# Patient Record
Sex: Male | Born: 1935
Health system: Southern US, Community
[De-identification: ages and names within clinical notes are randomized; demographics above are authoritative.]

## PROBLEM LIST (undated history)

## (undated) DIAGNOSIS — I639 Cerebral infarction, unspecified: Secondary | ICD-10-CM

## (undated) DIAGNOSIS — G4733 Obstructive sleep apnea (adult) (pediatric): Secondary | ICD-10-CM

## (undated) DIAGNOSIS — I10 Essential (primary) hypertension: Secondary | ICD-10-CM

## (undated) DIAGNOSIS — K259 Gastric ulcer, unspecified as acute or chronic, without hemorrhage or perforation: Secondary | ICD-10-CM

## (undated) DIAGNOSIS — G479 Sleep disorder, unspecified: Secondary | ICD-10-CM

## (undated) DIAGNOSIS — E785 Hyperlipidemia, unspecified: Secondary | ICD-10-CM

## (undated) DIAGNOSIS — N184 Chronic kidney disease, stage 4 (severe): Secondary | ICD-10-CM

## (undated) DIAGNOSIS — I5042 Chronic combined systolic (congestive) and diastolic (congestive) heart failure: Secondary | ICD-10-CM

## (undated) DIAGNOSIS — F0390 Unspecified dementia without behavioral disturbance: Secondary | ICD-10-CM

## (undated) HISTORY — PX: CATARACT EXTRACTION: SUR2

## (undated) HISTORY — DX: Cerebral infarction, unspecified: I63.9

## (undated) HISTORY — PX: ABDOMINAL SURGERY: SHX537

## (undated) HISTORY — DX: Sleep disorder, unspecified: G47.9

---

## 2000-03-28 ENCOUNTER — Inpatient Hospital Stay (HOSPITAL_COMMUNITY): Admission: EM | Admit: 2000-03-28 | Discharge: 2000-03-30 | Payer: Self-pay | Admitting: Emergency Medicine

## 2001-03-22 ENCOUNTER — Ambulatory Visit (HOSPITAL_COMMUNITY): Admission: RE | Admit: 2001-03-22 | Discharge: 2001-03-22 | Payer: Self-pay | Admitting: Gastroenterology

## 2001-06-27 ENCOUNTER — Ambulatory Visit (HOSPITAL_BASED_OUTPATIENT_CLINIC_OR_DEPARTMENT_OTHER): Admission: RE | Admit: 2001-06-27 | Discharge: 2001-06-27 | Payer: Self-pay | Admitting: Internal Medicine

## 2004-12-28 ENCOUNTER — Ambulatory Visit: Payer: Self-pay | Admitting: Internal Medicine

## 2013-03-04 ENCOUNTER — Emergency Department (HOSPITAL_COMMUNITY)
Admission: EM | Admit: 2013-03-04 | Discharge: 2013-03-04 | Disposition: A | Discharge status: Home / Self Care | Payer: Medicare Other | Source: Home / Self Care | Attending: Family Medicine | Admitting: Family Medicine

## 2013-03-04 ENCOUNTER — Encounter (HOSPITAL_COMMUNITY): Payer: Self-pay | Admitting: Emergency Medicine

## 2013-03-04 ENCOUNTER — Emergency Department (INDEPENDENT_AMBULATORY_CARE_PROVIDER_SITE_OTHER): Payer: Medicare Other

## 2013-03-04 DIAGNOSIS — S2249XA Multiple fractures of ribs, unspecified side, initial encounter for closed fracture: Secondary | ICD-10-CM

## 2013-03-04 HISTORY — DX: Essential (primary) hypertension: I10

## 2013-03-04 MED ORDER — HYDROCOD POLST-CHLORPHEN POLST 10-8 MG/5ML PO LQCR: 5.0000 mL | Freq: Two times a day (BID) | ORAL | Status: DC | PRN

## 2013-03-04 NOTE — Discharge Instructions (Signed)
Drink plenty of fluids as discussed, use medicine as prescribed, and mucinex or delsym for cough. Return or see your doctor if further problems °

## 2013-03-04 NOTE — ED Provider Notes (Signed)
CSN: [id number]     Arrival date & time 03/04/13  1129 History   First MD Initiated Contact with Patient 03/04/13 1154     Chief Complaint  Patient presents with  . Fall   (Consider location/radiation/quality/duration/timing/severity/associated sxs/prior Treatment) Patient is a 80 y.o. male presenting with fall. The history is provided by the patient and the spouse.  Fall This is a new problem. The current episode started yesterday. The problem has not changed since onset.Associated symptoms include chest pain. Pertinent negatives include no abdominal pain and no shortness of breath. Associated symptoms comments: Only hurts to cough. Having head cong., pnd..    Past Medical History  Diagnosis Date  . Hypertension    Past Surgical History  Procedure Laterality Date  . Abdominal surgery     History reviewed. No pertinent family history. History  Substance Use Topics  . Smoking status: Never Smoker   . Smokeless tobacco: Never Used  . Alcohol Use: No    Review of Systems  Constitutional: Negative.   HENT: Positive for congestion, postnasal drip and rhinorrhea.   Respiratory: Positive for cough. Negative for shortness of breath and wheezing.   Cardiovascular: Positive for chest pain.  Gastrointestinal: Negative.  Negative for abdominal pain.  Musculoskeletal: Negative.     Allergies  Review of patient's allergies indicates no known allergies.  Home Medications   Current Outpatient Rx  Name  Route  Sig  Dispense  Refill  . chlorpheniramine-HYDROcodone (TUSSIONEX PENNKINETIC ER) 10-8 MG/5ML LQCR   Oral   Take 5 mLs by mouth every 12 (twelve) hours as needed for cough.   115 mL   1    BP 130/76  Pulse 76  Temp(Src) 98.6 F (37 C) (Oral)  Resp 16  SpO2 97% Physical Exam  Nursing note and vitals reviewed. Constitutional: He is oriented to person, place, and time. He appears well-developed and well-nourished.  HENT:  Head: Normocephalic.  Right Ear: External ear  normal.  Left Ear: External ear normal.  Nose: Mucosal edema and rhinorrhea present.  Mouth/Throat: Oropharynx is clear and moist.  Eyes: Pupils are equal, round, and reactive to light.  Neck: Normal range of motion. Neck supple.  Cardiovascular: Normal rate, regular rhythm, normal heart sounds and intact distal pulses.   Pulmonary/Chest: Effort normal and breath sounds normal. He exhibits tenderness.    Lymphadenopathy:    He has no cervical adenopathy.  Neurological: He is alert and oriented to person, place, and time.  Skin: Skin is warm and dry.    ED Course  Procedures (including critical care time) Labs Review Labs Reviewed - No data to display Imaging Review Dg Ribs Unilateral W/chest Right  03/04/2013   CLINICAL DATA:  Right-sided pain post fall  EXAM: RIGHT RIBS AND CHEST - 3+ VIEW  COMPARISON:  None available  FINDINGS: Lungs are clear. Heart size upper limits normal. No effusion. No pneumothorax. Fractures of the lateral margin right fifth and sixth ribs, minimally displaced. Suggestion of some healing response at the level of the fifth rib fracture. The skin markers are significantly lower, at approximately the level of the ninth rib.  IMPRESSION: 1. Right fifth and sixth rib fractures, age indeterminate. 2. No pneumothorax or effusion   Electronically Signed   By: Arne Cleveland M.D.   On: 03/04/2013 12:29      MDM  X-rays reviewed and report per radiologist.     Billy Fischer, MD 03/04/13 1254

## 2013-03-04 NOTE — ED Notes (Signed)
Pt  Reports       Pain         r   Rib        Pt  Fell         Last  Pm         Pain  Worse  When  Coughs  And  Takes  A      Deep    Breath

## 2013-03-08 ENCOUNTER — Ambulatory Visit
Admission: RE | Admit: 2013-03-08 | Discharge: 2013-03-08 | Disposition: A | Discharge status: Home / Self Care | Payer: Medicare Other | Source: Ambulatory Visit | Attending: Family Medicine | Admitting: Family Medicine

## 2013-03-08 ENCOUNTER — Other Ambulatory Visit: Payer: Self-pay | Admitting: Family Medicine

## 2013-03-08 DIAGNOSIS — R059 Cough, unspecified: Secondary | ICD-10-CM

## 2013-03-08 DIAGNOSIS — R05 Cough: Secondary | ICD-10-CM

## 2013-12-02 DIAGNOSIS — I639 Cerebral infarction, unspecified: Secondary | ICD-10-CM

## 2013-12-02 HISTORY — DX: Cerebral infarction, unspecified: I63.9

## 2013-12-14 ENCOUNTER — Emergency Department (HOSPITAL_COMMUNITY): Payer: Medicare Other

## 2013-12-14 ENCOUNTER — Inpatient Hospital Stay (HOSPITAL_COMMUNITY)
Admission: EM | Admit: 2013-12-14 | Discharge: 2013-12-20 | DRG: 064 | Disposition: A | Discharge status: Rehabilitation Facility | Payer: Medicare Other | Attending: Neurology | Admitting: Neurology

## 2013-12-14 ENCOUNTER — Encounter (HOSPITAL_COMMUNITY): Payer: Self-pay | Admitting: Family Medicine

## 2013-12-14 DIAGNOSIS — E876 Hypokalemia: Secondary | ICD-10-CM

## 2013-12-14 DIAGNOSIS — I10 Essential (primary) hypertension: Secondary | ICD-10-CM | POA: Diagnosis present

## 2013-12-14 DIAGNOSIS — R509 Fever, unspecified: Secondary | ICD-10-CM | POA: Diagnosis present

## 2013-12-14 DIAGNOSIS — M109 Gout, unspecified: Secondary | ICD-10-CM | POA: Diagnosis present

## 2013-12-14 DIAGNOSIS — H5442 Blindness, left eye, normal vision right eye: Secondary | ICD-10-CM | POA: Diagnosis present

## 2013-12-14 DIAGNOSIS — G9340 Encephalopathy, unspecified: Secondary | ICD-10-CM | POA: Insufficient documentation

## 2013-12-14 DIAGNOSIS — R414 Neurologic neglect syndrome: Secondary | ICD-10-CM | POA: Diagnosis present

## 2013-12-14 DIAGNOSIS — E785 Hyperlipidemia, unspecified: Secondary | ICD-10-CM | POA: Diagnosis present

## 2013-12-14 DIAGNOSIS — R0681 Apnea, not elsewhere classified: Secondary | ICD-10-CM | POA: Diagnosis not present

## 2013-12-14 DIAGNOSIS — Z452 Encounter for adjustment and management of vascular access device: Secondary | ICD-10-CM | POA: Insufficient documentation

## 2013-12-14 DIAGNOSIS — N390 Urinary tract infection, site not specified: Secondary | ICD-10-CM | POA: Diagnosis present

## 2013-12-14 DIAGNOSIS — I611 Nontraumatic intracerebral hemorrhage in hemisphere, cortical: Principal | ICD-10-CM | POA: Diagnosis present

## 2013-12-14 DIAGNOSIS — Z7982 Long term (current) use of aspirin: Secondary | ICD-10-CM | POA: Diagnosis not present

## 2013-12-14 DIAGNOSIS — G8194 Hemiplegia, unspecified affecting left nondominant side: Secondary | ICD-10-CM | POA: Diagnosis present

## 2013-12-14 DIAGNOSIS — I639 Cerebral infarction, unspecified: Secondary | ICD-10-CM

## 2013-12-14 DIAGNOSIS — G936 Cerebral edema: Secondary | ICD-10-CM | POA: Diagnosis present

## 2013-12-14 DIAGNOSIS — Z79899 Other long term (current) drug therapy: Secondary | ICD-10-CM | POA: Diagnosis not present

## 2013-12-14 DIAGNOSIS — Z23 Encounter for immunization: Secondary | ICD-10-CM | POA: Insufficient documentation

## 2013-12-14 DIAGNOSIS — R4182 Altered mental status, unspecified: Secondary | ICD-10-CM | POA: Diagnosis present

## 2013-12-14 DIAGNOSIS — I619 Nontraumatic intracerebral hemorrhage, unspecified: Secondary | ICD-10-CM

## 2013-12-14 LAB — COMPREHENSIVE METABOLIC PANEL
ALBUMIN: 3.5 g/dL (ref 3.5–5.2)
ALK PHOS: 45 U/L (ref 39–117)
ALT: 18 U/L (ref 0–53)
ALT: 18 U/L (ref 0–53)
AST: 24 U/L (ref 0–37)
AST: 26 U/L (ref 0–37)
Albumin: 3.7 g/dL (ref 3.5–5.2)
Alkaline Phosphatase: 46 U/L (ref 39–117)
Anion gap: 14 (ref 5–15)
Anion gap: 16 — ABNORMAL HIGH (ref 5–15)
BILIRUBIN TOTAL: 0.7 mg/dL (ref 0.3–1.2)
BUN: 26 mg/dL — ABNORMAL HIGH (ref 6–23)
BUN: 26 mg/dL — AB (ref 6–23)
CHLORIDE: 98 meq/L (ref 96–112)
CO2: 26 mEq/L (ref 19–32)
CO2: 23 mEq/L (ref 19–32)
Calcium: 9.3 mg/dL (ref 8.4–10.5)
Calcium: 8.9 mg/dL (ref 8.4–10.5)
Chloride: 98 mEq/L (ref 96–112)
Creatinine, Ser: 0.92 mg/dL (ref 0.50–1.35)
Creatinine, Ser: 0.88 mg/dL (ref 0.50–1.35)
GFR calc Af Amer: 90 mL/min — ABNORMAL LOW (ref >90)
GFR calc Af Amer: >90 mL/min (ref >90)
GFR calc non Af Amer: 78 mL/min — ABNORMAL LOW (ref >90)
GFR calc non Af Amer: 79 mL/min — ABNORMAL LOW (ref >90)
Glucose, Bld: 129 mg/dL — ABNORMAL HIGH (ref 70–99)
Glucose, Bld: 120 mg/dL — ABNORMAL HIGH (ref 70–99)
Potassium: 4.2 mEq/L (ref 3.7–5.3)
Potassium: 4.1 mEq/L (ref 3.7–5.3)
Sodium: 138 mEq/L (ref 137–147)
Sodium: 137 mEq/L (ref 137–147)
Total Bilirubin: 0.7 mg/dL (ref 0.3–1.2)
Total Protein: 8.2 g/dL (ref 6.0–8.3)
Total Protein: 7.8 g/dL (ref 6.0–8.3)

## 2013-12-14 LAB — LIPID PANEL
Cholesterol: 165 mg/dL (ref 0–200)
HDL: 57 mg/dL (ref >39)
LDL CALC: 86 mg/dL (ref 0–99)
Total CHOL/HDL Ratio: 2.9 RATIO
Triglycerides: 112 mg/dL (ref <150)
VLDL: 22 mg/dL (ref 0–40)

## 2013-12-14 LAB — URINE MICROSCOPIC-ADD ON

## 2013-12-14 LAB — CBC WITH DIFFERENTIAL/PLATELET
BASOS ABS: 0 10*3/uL (ref 0.0–0.1)
Basophils Absolute: 0 10*3/uL (ref 0.0–0.1)
Basophils Relative: 0 % (ref 0–1)
Basophils Relative: 0 % (ref 0–1)
EOS ABS: 0 10*3/uL (ref 0.0–0.7)
Eosinophils Absolute: 0 10*3/uL (ref 0.0–0.7)
Eosinophils Relative: 0 % (ref 0–5)
Eosinophils Relative: 0 % (ref 0–5)
HCT: 40.8 % (ref 39.0–52.0)
HCT: 39.7 % (ref 39.0–52.0)
Hemoglobin: 13.5 g/dL (ref 13.0–17.0)
Hemoglobin: 13.3 g/dL (ref 13.0–17.0)
LYMPHS PCT: 22 % (ref 12–46)
Lymphocytes Relative: 19 % (ref 12–46)
Lymphs Abs: 2.6 10*3/uL (ref 0.7–4.0)
Lymphs Abs: 3.5 10*3/uL (ref 0.7–4.0)
MCH: 30.2 pg (ref 26.0–34.0)
MCH: 30.5 pg (ref 26.0–34.0)
MCHC: 33.1 g/dL (ref 30.0–36.0)
MCHC: 33.5 g/dL (ref 30.0–36.0)
MCV: 91.3 fL (ref 78.0–100.0)
MCV: 91.1 fL (ref 78.0–100.0)
Monocytes Absolute: 1 10*3/uL (ref 0.1–1.0)
Monocytes Absolute: 1.3 10*3/uL — ABNORMAL HIGH (ref 0.1–1.0)
Monocytes Relative: 8 % (ref 3–12)
Monocytes Relative: 8 % (ref 3–12)
NEUTROS ABS: 11.2 10*3/uL — AB (ref 1.7–7.7)
Neutro Abs: 9.9 10*3/uL — ABNORMAL HIGH (ref 1.7–7.7)
Neutrophils Relative %: 73 % (ref 43–77)
Neutrophils Relative %: 70 % (ref 43–77)
Platelets: 279 10*3/uL (ref 150–400)
Platelets: ADEQUATE 10*3/uL (ref 150–400)
RBC: 4.47 MIL/uL (ref 4.22–5.81)
RBC: 4.36 MIL/uL (ref 4.22–5.81)
RDW: 13.7 % (ref 11.5–15.5)
RDW: 13.6 % (ref 11.5–15.5)
Smear Review: ADEQUATE
WBC: 13.5 10*3/uL — ABNORMAL HIGH (ref 4.0–10.5)
WBC: 16 10*3/uL — ABNORMAL HIGH (ref 4.0–10.5)

## 2013-12-14 LAB — URINALYSIS, ROUTINE W REFLEX MICROSCOPIC
Bilirubin Urine: NEGATIVE
Glucose, UA: NEGATIVE mg/dL
Ketones, ur: NEGATIVE mg/dL
Leukocytes, UA: NEGATIVE
Nitrite: NEGATIVE
Protein, ur: 100 mg/dL — AB
Specific Gravity, Urine: 1.021 (ref 1.005–1.030)
Urobilinogen, UA: 0.2 mg/dL (ref 0.0–1.0)
pH: 6.5 (ref 5.0–8.0)

## 2013-12-14 LAB — GLUCOSE, CAPILLARY: GLUCOSE-CAPILLARY: 128 mg/dL — AB (ref 70–99)

## 2013-12-14 LAB — AMMONIA: Ammonia: 26 umol/L (ref 11–60)

## 2013-12-14 LAB — APTT
aPTT: 31 seconds (ref 24–37)
aPTT: 24 seconds (ref 24–37)

## 2013-12-14 LAB — I-STAT TROPONIN, ED: Troponin i, poc: 0.01 ng/mL (ref 0.00–0.08)

## 2013-12-14 LAB — PROTIME-INR
INR: 1.03 (ref 0.00–1.49)
INR: 1.04 (ref 0.00–1.49)
PROTHROMBIN TIME: 13.7 s (ref 11.6–15.2)
PROTHROMBIN TIME: 13.7 s (ref 11.6–15.2)

## 2013-12-14 LAB — I-STAT CG4 LACTIC ACID, ED: Lactic Acid, Venous: 1.99 mmol/L (ref 0.5–2.2)

## 2013-12-14 LAB — MRSA PCR SCREENING: MRSA by PCR: NEGATIVE

## 2013-12-14 MED ORDER — ONDANSETRON HCL 4 MG/2ML IJ SOLN
4.0000 mg | Freq: Once | INTRAMUSCULAR | Status: AC
  Administered 2013-12-14: 4 mg via INTRAVENOUS
  Filled 2013-12-14: qty 2

## 2013-12-14 MED ORDER — SODIUM CHLORIDE 0.9 % IV SOLN
INTRAVENOUS | Status: DC
  Administered 2013-12-14 – 2013-12-15 (×2): via INTRAVENOUS

## 2013-12-14 MED ORDER — LOSARTAN POTASSIUM 50 MG PO TABS
100.0000 mg | ORAL_TABLET | Freq: Every day | ORAL | Status: DC
  Administered 2013-12-15 – 2013-12-20 (×7): 100 mg via ORAL
  Filled 2013-12-14 (×7): qty 2

## 2013-12-14 MED ORDER — ACETAMINOPHEN 325 MG PO TABS
650.0000 mg | ORAL_TABLET | Freq: Once | ORAL | Status: DC
  Filled 2013-12-14: qty 2

## 2013-12-14 MED ORDER — PANTOPRAZOLE SODIUM 40 MG IV SOLR
40.0000 mg | Freq: Every day | INTRAVENOUS | Status: DC
  Administered 2013-12-14: 40 mg via INTRAVENOUS
  Filled 2013-12-14 (×2): qty 40

## 2013-12-14 MED ORDER — SIMVASTATIN 40 MG PO TABS
40.0000 mg | ORAL_TABLET | Freq: Every day | ORAL | Status: DC
  Filled 2013-12-14 (×2): qty 1

## 2013-12-14 MED ORDER — LABETALOL HCL 5 MG/ML IV SOLN
10.0000 mg | INTRAVENOUS | Status: DC | PRN
  Administered 2013-12-14: 20 mg via INTRAVENOUS
  Administered 2013-12-14: 40 mg via INTRAVENOUS
  Administered 2013-12-14 – 2013-12-16 (×8): 20 mg via INTRAVENOUS
  Administered 2013-12-17: 30 mg via INTRAVENOUS
  Administered 2013-12-17: 40 mg via INTRAVENOUS
  Administered 2013-12-17 (×2): 20 mg via INTRAVENOUS
  Administered 2013-12-17: 40 mg via INTRAVENOUS
  Filled 2013-12-14: qty 8
  Filled 2013-12-14: qty 4
  Filled 2013-12-14 (×2): qty 8
  Filled 2013-12-14 (×3): qty 4
  Filled 2013-12-14: qty 8
  Filled 2013-12-14: qty 4
  Filled 2013-12-14 (×2): qty 8
  Filled 2013-12-14 (×3): qty 4

## 2013-12-14 MED ORDER — SENNOSIDES-DOCUSATE SODIUM 8.6-50 MG PO TABS
1.0000 | ORAL_TABLET | Freq: Two times a day (BID) | ORAL | Status: DC
  Administered 2013-12-15 – 2013-12-20 (×8): 1 via ORAL
  Filled 2013-12-14 (×13): qty 1

## 2013-12-14 MED ORDER — HYDROCHLOROTHIAZIDE 25 MG PO TABS
25.0000 mg | ORAL_TABLET | Freq: Every day | ORAL | Status: DC
  Administered 2013-12-15 – 2013-12-17 (×3): 25 mg via ORAL
  Filled 2013-12-14 (×5): qty 1

## 2013-12-14 MED ORDER — METOPROLOL TARTRATE 50 MG PO TABS
50.0000 mg | ORAL_TABLET | Freq: Two times a day (BID) | ORAL | Status: DC
  Administered 2013-12-15 (×2): 50 mg via ORAL
  Filled 2013-12-14: qty 2
  Filled 2013-12-14 (×5): qty 1

## 2013-12-14 MED ORDER — ACETAMINOPHEN 325 MG PO TABS
650.0000 mg | ORAL_TABLET | ORAL | Status: DC | PRN
  Administered 2013-12-15 – 2013-12-19 (×7): 650 mg via ORAL
  Filled 2013-12-14 (×8): qty 2

## 2013-12-14 MED ORDER — SODIUM CHLORIDE 0.9 % IV BOLUS (SEPSIS)
1000.0000 mL | Freq: Once | INTRAVENOUS | Status: AC
Start: 2013-12-14 — End: 2013-12-14
  Administered 2013-12-14: 1000 mL via INTRAVENOUS

## 2013-12-14 MED ORDER — LATANOPROST 0.005 % OP SOLN
1.0000 [drp] | Freq: Every day | OPHTHALMIC | Status: DC
  Administered 2013-12-15 – 2013-12-19 (×5): 1 [drp] via OPHTHALMIC
  Filled 2013-12-14 (×2): qty 2.5

## 2013-12-14 MED ORDER — STROKE: EARLY STAGES OF RECOVERY BOOK
Freq: Once | Status: AC
  Administered 2013-12-14: 17:00:00
  Filled 2013-12-14: qty 1

## 2013-12-14 MED ORDER — ACETAMINOPHEN 650 MG RE SUPP: 650.0000 mg | RECTAL | Status: DC | PRN

## 2013-12-14 MED ORDER — LABETALOL HCL 5 MG/ML IV SOLN
INTRAVENOUS | Status: AC
  Filled 2013-12-14: qty 4

## 2013-12-14 NOTE — ED Notes (Signed)
Per pt family pt has been confused, sleeping a lot, urinary frequency since yesterday. Pt pale. Sent here by doctor.

## 2013-12-14 NOTE — H&P (Addendum)
H&P    Chief Complaint: ICH  HPI:                                                                                                                                         Brian Wolf is an 80 y.o. male who was noted yesterday to by wife that he was acting strange. He was complaining of a right temporal pain when he coughed and seemed off balance. She noted he went out to the drive way and seemed to be walking to the right and then was not talking sensibly.  Wife also noted some weakness in his left hand.  This AM she brought him to his PCP who directed them to the ED. On arrival to ED initial CT scan showed a large right acute hemorraghic infarct. Currently patient is complaining of a slight pain on the right temple, and that he is thirsty.  He will answer some questions but desires to not take part exam.   Date last known well: Date: 12/13/2013 Time last known well: Unable to determine tPA Given: No: ICH  Past Medical History  Diagnosis Date  . Hypertension     Past Surgical History  Procedure Laterality Date  . Abdominal surgery      Family History  Problem Relation Age of Onset  . Hyperlipidemia Mother   . Hypertension Mother   . Hypertension Father    Social History:  reports that he has never smoked. He has never used smokeless tobacco. He reports that he does not drink alcohol. His drug history is not on file.  Allergies: No Known Allergies  Medications:                                                                                                                           Current Facility-Administered Medications  Medication Dose Route Frequency Provider Last Rate Last Dose  . acetaminophen (TYLENOL) tablet 650 mg  650 mg Oral Once Noland Fordyce, PA-C      . hydrochlorothiazide (HYDRODIURIL) tablet 25 mg  25 mg Oral Daily Marliss Coots, PA-C      . latanoprost (XALATAN) 0.005 % ophthalmic solution 1 drop  1 drop Both Eyes QHS Marliss Coots, PA-C      .  losartan (COZAAR) tablet 100 mg  100 mg Oral Daily Marliss Coots, PA-C      .  metoprolol tartrate (LOPRESSOR) tablet 50 mg  50 mg Oral BID Marliss Coots, PA-C      . simvastatin (ZOCOR) tablet 40 mg  40 mg Oral Daily Marliss Coots, PA-C      . sodium chloride 0.9 % bolus 1,000 mL  1,000 mL Intravenous Once Noland Fordyce, PA-C       Current Outpatient Prescriptions  Medication Sig Dispense Refill  . aspirin EC 81 MG tablet Take 81 mg by mouth daily.    . hydrochlorothiazide (HYDRODIURIL) 25 MG tablet Take 25 mg by mouth daily.    Marland Kitchen losartan (COZAAR) 100 MG tablet Take 100 mg by mouth daily.    . metoprolol (LOPRESSOR) 50 MG tablet Take 50 mg by mouth 2 (two) times daily.    . simvastatin (ZOCOR) 40 MG tablet Take 40 mg by mouth daily.    . Travoprost, BAK Free, (TRAVATAN) 0.004 % SOLN ophthalmic solution Place 1 drop into both eyes at bedtime.    . chlorpheniramine-HYDROcodone (TUSSIONEX PENNKINETIC ER) 10-8 MG/5ML LQCR Take 5 mLs by mouth every 12 (twelve) hours as needed for cough. (Patient not taking: Reported on 12/14/2013) 115 mL 1     ROS:                                                                                                                                       History obtained from wife  General ROS: negative for - chills, fatigue, fever, night sweats, weight gain or weight loss Psychological ROS: negative for - behavioral disorder, hallucinations, memory difficulties, mood swings or suicidal ideation Ophthalmic ROS: negative for - blurry vision, double vision, eye pain or loss of vision ENT ROS: negative for - epistaxis, nasal discharge, oral lesions, sore throat, tinnitus or vertigo Allergy and Immunology ROS: negative for - hives or itchy/watery eyes Hematological and Lymphatic ROS: negative for - bleeding problems, bruising or swollen lymph nodes Endocrine ROS: negative for - galactorrhea, hair pattern changes, polydipsia/polyuria or temperature intolerance Respiratory  ROS: negative for - cough, hemoptysis, shortness of breath or wheezing Cardiovascular ROS: negative for - chest pain, dyspnea on exertion, edema or irregular heartbeat Gastrointestinal ROS: negative for - abdominal pain, diarrhea, hematemesis, nausea/vomiting or stool incontinence Genito-Urinary ROS: negative for - dysuria, hematuria, incontinence or urinary frequency/urgency Musculoskeletal ROS: negative for - joint swelling or muscular weakness Neurological ROS: as noted in HPI Dermatological ROS: negative for rash and skin lesion changes  Neurologic Examination:  Blood pressure 166/58, pulse 81, temperature 101.3 F (38.5 C), temperature source Rectal, resp. rate 19, SpO2 97 %.   Physical Exam  Constitutional: He appears well-developed and well-nourished.  HENT:  Head: Normocephalic.  Cardiovascular: Normal rate and regular rhythm.  Respiratory: Effort normal and breath sounds normal.  GI: Soft. Bowel sounds are normal. He exhibits no distension. There is no tenderness.    General: Mental Status: Alert, oriented to the hospital but does not know the name, able to state it is November but cannot tell me the year.  Speech fluent without evidence of aphasia but minimal.  Able to follow simple commands but desires not to take part in much of exam. Cranial Nerves: II: Discs flat bilaterally; Visual fields shows a left hemianopsia, pupils equal, round, reactive to light and accommodation III,IV, VI: ptosis not present, extra-ocular motions intact bilaterally V,VII: smile symmetric, facial light touch sensation normal bilaterally VIII: hearing normal bilaterally IX,X: gag reflex present XI: bilateral shoulder shrug XII: midline tongue extension without atrophy or fasciculations  Motor:  --patient would only take part in parts of exam Right : Upper extremity   5/5    Left:     Upper  extremity   4/5  Lower extremity   5/5     Lower extremity   5/5 Tone and bulk:normal tone throughout; no atrophy noted Sensory: Pinprick and light touch intact throughout, bilaterally--he appears to have a left arm sensory neglect to DSS Deep Tendon Reflexes:  Right: Upper Extremity   Left: Upper extremity   biceps (C-5 to C-6) 2/4   biceps (C-5 to C-6) 2/4 tricep (C7) 2/4    triceps (C7) 2/4 Brachioradialis (C6) 2/4  Brachioradialis (C6) 2/4  Lower Extremity Lower Extremity  quadriceps (L-2 to L-4) 2/4   quadriceps (L-2 to L-4) 2/4 Achilles (S1) 1/4   Achilles (S1) 1/4  Plantars: Right: downgoing   Left: downgoing Cerebellar: normal finger-to-nose,  normal heel-to-shin test Gait: not tested due to patient saftey CV: pulses palpable throughout    Lab Results: Basic Metabolic Panel:  Recent Labs Lab 12/14/13 1137  NA 138  K 4.2  CL 98  CO2 26  GLUCOSE 129*  BUN 26*  CREATININE 0.92  CALCIUM 9.3    Liver Function Tests:  Recent Labs Lab 12/14/13 1137  AST 24  ALT 18  ALKPHOS 46  BILITOT 0.7  PROT 8.2  ALBUMIN 3.7   No results for input(s): LIPASE, AMYLASE in the last 168 hours.  Recent Labs Lab 12/14/13 1145  AMMONIA 26    CBC:  Recent Labs Lab 12/14/13 1137  WBC 13.5*  NEUTROABS 9.9*  HGB 13.5  HCT 40.8  MCV 91.3  PLT 279    Cardiac Enzymes: No results for input(s): CKTOTAL, CKMB, CKMBINDEX, TROPONINI in the last 168 hours.  Lipid Panel: No results for input(s): CHOL, TRIG, HDL, CHOLHDL, VLDL, LDLCALC in the last 168 hours.  CBG: No results for input(s): GLUCAP in the last 168 hours.  Microbiology: No results found for this or any previous visit.  Coagulation Studies: No results for input(s): LABPROT, INR in the last 72 hours.  Imaging: Dg Chest 2 View  12/14/2013   CLINICAL DATA:  Altered mental status, stroke, history hypertension  EXAM: CHEST  2 VIEW  COMPARISON:  03/08/2013  FINDINGS: Enlargement of cardiac silhouette.   Mediastinal contours and pulmonary vascularity normal.  Atherosclerotic calcification aorta with aortic elongation.  Minimal chronic peribronchial thickening.  No infiltrate, pleural effusion or pneumothorax.  Minimal  LEFT basilar atelectasis.  Generally low lung volumes.  Bones unremarkable.  IMPRESSION: Enlargement of cardiac silhouette.  Minimal bronchitic changes and LEFT basilar atelectasis.   Electronically Signed   By: Lavonia Dana M.D.   On: 12/14/2013 12:11   Ct Head Wo Contrast  12/14/2013   CLINICAL DATA:  Altered mental status, increasing confusion over past few days per family  EXAM: CT HEAD WITHOUT CONTRAST  TECHNIQUE: Contiguous axial images were obtained from the base of the skull through the vertex without intravenous contrast.  COMPARISON:  None  FINDINGS: Generalized atrophy.  Small vessel chronic ischemic changes of deep cerebral white matter.  Normal ventricular morphology.  Large acute hemorrhagic infarct in the posterior RIGHT parietal lobe, area of high attenuation acute blood measuring 5.3 x 3.3 cm image 15.  Surrounding vasogenic edema.  No intraventricular or definite extra-axial extension of hemorrhage.  Minimal RIGHT to LEFT midline shift approximately 3 mm.  No mass lesion or additional areas of hemorrhage/infarction.  Atherosclerotic calcifications at carotid siphons and vertebral arteries.  Bones and sinuses unremarkable.  IMPRESSION: Large acute hemorrhagic infarct in the posterior RIGHT parietal lobe, area of hemorrhage 5.3 x 3.3 cm with surrounding vasogenic edema and minimal RIGHT LEFT midline shift.  Underlying atrophy and small vessel chronic ischemic changes of deep cerebral white matter.  Critical Value/emergent results were called by telephone at the time of interpretation on 12/14/2013 at 12:02 pm to [facility], who verbally acknowledged these results.   Electronically Signed   By: Lavonia Dana M.D.   On: 12/14/2013 12:04    Etta Quill PA-C [facility] [phone]  12/14/2013, 1:54 PM   Patient seen and examined.  Clinical course and management discussed.  Necessary edits performed.  I agree with the above.  Assessment and plan of care developed and discussed below.     Assessment: 80 y.o. male presenting with new onset difficulty with gait and left sided weakness. Neurological examination reveals some mild left sided weakness, difficulty with speech, [facility] and left neglect.  Head CT reviewed and shows a right parietal hemorrhagic infarct.  There is surrounding edema but no significant midline shift.  BP slightly elevated.  Patient on no anticoagulation at home.  Is on ASA.   Patient also febrile.  CXR shows no evidence of consolidation or pneumonia. Patient will require ICU admission.  Stroke Risk Factors - hypertension   Recommendations: 1. HgbA1c, fasting lipid panel 2. MRI, MRA  of the brain without contrast 3. PT consult, OT consult, Speech consult 4. Echocardiogram 5. Carotid dopplers 6. Prophylactic therapy-None 7. Blood cultures X 2, UA and culture 8. Telemetry monitoring 9. Frequent neuro checks 10. Repeat imaging in 24 hours  This patient is critically ill and at significant risk of neurological worsening, death and care requires constant monitoring of vital signs, hemodynamics,respiratory and cardiac monitoring, neurological assessment, discussion with family, other specialists and medical decision making of high complexity. I spent 60 minutes of neurocritical care time  in the care of  this patient.  Alexis Goodell, MD [facility] [phone]  12/14/2013  1:55 PM

## 2013-12-14 NOTE — Progress Notes (Signed)
UR completed.  Joniqua Sidle, RN BSN MHA CCM Trauma/Neuro ICU Case Manager [phone]  

## 2013-12-14 NOTE — ED Provider Notes (Signed)
CSN: [id number]     Arrival date & time 12/14/13  1100 History   First MD Initiated Contact with Patient 12/14/13 1106     Chief Complaint  Patient presents with  . Altered Mental Status     (Consider location/radiation/quality/duration/timing/severity/associated sxs/prior Treatment) HPI Pt is an 80yo male sent to ED by PCP with concern for reports of hypersomnolence, AMS, and urinary frequency that started yesterday morning. Wife states pt was "not acting himself" yesterday, appeared to be off balance and was leaning more toward the right to go around things while walking.  Pt did c/o right sided headache earlier to his wife but in ED pt denies any pain.  Pt requesting something to drink due to a dry mouth. Pt denies chest pain, SOB, or abdominal pain. Denies urinary symptoms. Wife does state pt had 1 episode of emesis earlier this morning and it appeared to have blood in the emesis.  Wife reports hx of previous GI bleed.  Unknown if blood in pt's stool. No recent fall or head trauma. No recent travel or recent illness. Pt is on aspirin, no other blood thinners.   Past Medical History  Diagnosis Date  . Hypertension    Past Surgical History  Procedure Laterality Date  . Abdominal surgery     Family History  Problem Relation Age of Onset  . Hyperlipidemia Mother   . Hypertension Mother   . Hypertension Father    History  Substance Use Topics  . Smoking status: Never Smoker   . Smokeless tobacco: Never Used  . Alcohol Use: No    Review of Systems  Constitutional: Positive for fatigue.  Respiratory: Negative for shortness of breath.   Cardiovascular: Negative for chest pain.  Endocrine: Positive for polyuria.  Neurological: Positive for weakness. Negative for headaches.  All other systems reviewed and are negative.     Allergies  Review of patient's allergies indicates no known allergies.  Home Medications   Prior to Admission medications   Medication Sig Start Date  End Date Taking? Authorizing Provider  aspirin EC 81 MG tablet Take 81 mg by mouth daily.   Yes Historical Provider, MD  hydrochlorothiazide (HYDRODIURIL) 25 MG tablet Take 25 mg by mouth daily. 11/30/13  Yes Historical Provider, MD  losartan (COZAAR) 100 MG tablet Take 100 mg by mouth daily. 11/30/13  Yes Historical Provider, MD  metoprolol (LOPRESSOR) 50 MG tablet Take 50 mg by mouth 2 (two) times daily. 11/30/13  Yes Historical Provider, MD  simvastatin (ZOCOR) 40 MG tablet Take 40 mg by mouth daily. 11/30/13  Yes Historical Provider, MD  Travoprost, BAK Free, (TRAVATAN) 0.004 % SOLN ophthalmic solution Place 1 drop into both eyes at bedtime.   Yes Historical Provider, MD  chlorpheniramine-HYDROcodone (TUSSIONEX PENNKINETIC ER) 10-8 MG/5ML LQCR Take 5 mLs by mouth every 12 (twelve) hours as needed for cough. Patient not taking: Reported on 12/14/2013 03/04/13   Billy Fischer, MD   BP 165/64 mmHg  Pulse 85  Temp(Src) 101.3 F (38.5 C) (Rectal)  Resp 37  SpO2 95% Physical Exam  Constitutional: He is oriented to person, place, and time. He appears well-developed and well-nourished.  Pt appears fatigued  HENT:  Head: Normocephalic and atraumatic.  Mouth/Throat: Mucous membranes are dry.  Dried mucous membranes  Eyes: Conjunctivae and EOM are normal. Pupils are equal, round, and reactive to light. No scleral icterus.  Unable to see out of left eye  Neck: Normal range of motion. Neck supple.  Cardiovascular: Normal rate,  regular rhythm and normal heart sounds.   Pulmonary/Chest: Effort normal. No respiratory distress. He has decreased breath sounds in the right lower field and the left lower field. He has no wheezes. He has no rales. He exhibits no tenderness.  No respiratory distress. Decreased breath sounds in lower lung fields bilaterally.   Abdominal: Soft. Bowel sounds are normal. He exhibits no distension and no mass. There is no tenderness. There is no rebound and no guarding.   Musculoskeletal: Normal range of motion. He exhibits no tenderness.  Neurological: He is alert and oriented to person, place, and time.  Slowed in movements and slowed to speak, however alert to person, place and time. Repeatedly asking for something to drink due to dried mouth. Able to follow 2 step commands. 5/5 strength in upper and lower extremities bilaterally. Pt not ambulated as it is unsafe at this time.   Skin: Skin is warm and dry.  Nursing note and vitals reviewed.   ED Course  Procedures (including critical care time) Labs Review Labs Reviewed  COMPREHENSIVE METABOLIC PANEL - Abnormal; Notable for the following:    Glucose, Bld 129 (*)    BUN 26 (*)    GFR calc non Af Amer 78 (*)    GFR calc Af Amer 90 (*)    All other components within normal limits  CBC WITH DIFFERENTIAL - Abnormal; Notable for the following:    WBC 13.5 (*)    Neutro Abs 9.9 (*)    All other components within normal limits  URINE CULTURE  CULTURE, BLOOD (ROUTINE X 2)  CULTURE, BLOOD (ROUTINE X 2)  AMMONIA  URINALYSIS, ROUTINE W REFLEX MICROSCOPIC  COMPREHENSIVE METABOLIC PANEL  CBC WITH DIFFERENTIAL  PROTIME-INR  APTT  I-STAT TROPOININ, ED  I-STAT CG4 LACTIC ACID, ED  POC OCCULT BLOOD, ED    Imaging Review Dg Chest 2 View  12/14/2013   CLINICAL DATA:  Altered mental status, stroke, history hypertension  EXAM: CHEST  2 VIEW  COMPARISON:  03/08/2013  FINDINGS: Enlargement of cardiac silhouette.  Mediastinal contours and pulmonary vascularity normal.  Atherosclerotic calcification aorta with aortic elongation.  Minimal chronic peribronchial thickening.  No infiltrate, pleural effusion or pneumothorax.  Minimal LEFT basilar atelectasis.  Generally low lung volumes.  Bones unremarkable.  IMPRESSION: Enlargement of cardiac silhouette.  Minimal bronchitic changes and LEFT basilar atelectasis.   Electronically Signed   By: Lavonia Dana M.D.   On: 12/14/2013 12:11   Ct Head Wo Contrast  12/14/2013    CLINICAL DATA:  Altered mental status, increasing confusion over past few days per family  EXAM: CT HEAD WITHOUT CONTRAST  TECHNIQUE: Contiguous axial images were obtained from the base of the skull through the vertex without intravenous contrast.  COMPARISON:  None  FINDINGS: Generalized atrophy.  Small vessel chronic ischemic changes of deep cerebral white matter.  Normal ventricular morphology.  Large acute hemorrhagic infarct in the posterior RIGHT parietal lobe, area of high attenuation acute blood measuring 5.3 x 3.3 cm image 15.  Surrounding vasogenic edema.  No intraventricular or definite extra-axial extension of hemorrhage.  Minimal RIGHT to LEFT midline shift approximately 3 mm.  No mass lesion or additional areas of hemorrhage/infarction.  Atherosclerotic calcifications at carotid siphons and vertebral arteries.  Bones and sinuses unremarkable.  IMPRESSION: Large acute hemorrhagic infarct in the posterior RIGHT parietal lobe, area of hemorrhage 5.3 x 3.3 cm with surrounding vasogenic edema and minimal RIGHT LEFT midline shift.  Underlying atrophy and small vessel chronic ischemic changes  of deep cerebral white matter.  Critical Value/emergent results were called by telephone at the time of interpretation on 12/14/2013 at 12:02 pm to [facility] PA, who verbally acknowledged these results.   Electronically Signed   By: Lavonia Dana M.D.   On: 12/14/2013 12:04     EKG Interpretation   Date/Time:  Friday December 14 2013 12:24:34 EST Ventricular Rate:  66 PR Interval:  183 QRS Duration: 99 QT Interval:  425 QTC Calculation: 445 R Axis:   15 Text Interpretation:  Sinus rhythm LVH with secondary repolarization  abnormality Inferior infarct, old No significant change since last tracing  28 Mar 2000 Confirmed by [facility]  MD-I, IVA ([id number]) on 12/14/2013 12:27:51  PM      MDM   Final diagnoses:  Altered mental status    Pt is an 80yo male presenting to ED with reports of AMS that started  yesterday. On exam, pt is alert and oriented x3, however, decreased responsiveness. Pt also appears blind in left eye which is c/w wife's story of pt seeming to be off balance and leaning to the right, possibly compensation for vision loss. Pt did have recent cataract surgery in same eye. Pt denies any pain upon arrival to ED however repeatedly asked for something to drink. Dried mucous membranes noted. Pt is febrile. Concern for sepsis from pneumonia, UTI, or other infection process vs CVA.    CT head: significant for large acute hemorrhagic infarct in posterior RIGHT parietal lobe.  Pt discussed with Dr. Rolland Porter who also examined pt.  Agrees with assessment.  Will consult neurology. 12:16 PM Consulted with Dr. Doy Mince who will come assess pt.  Neurology examined pt and will be admitting pt.     Noland Fordyce, PA-C 12/14/13 [place], MD 12/14/13 [phone]

## 2013-12-14 NOTE — ED Provider Notes (Addendum)
Patient takes aspirin 81 mg a day just for general health. Wife states he started complaining of right temple pain which she states only hurts when he coughs. Wife started noticing yesterday morning he seemed off balance. He also seemed to have trouble and that if he wanted to go to the trash can inside of walking straight towards it he would make a big circle and come around to it. She states he's right handed and she has not noticed any weakness in his left hand. They went to their PCP today and they were sent to the ED.  Patient is awake repetitively asking for something to drink. He is moving all extremities. He is noted to have a visual field loss on the left that is pronounced. Wife states he just had cataract surgery in that eye 3 weeks ago. He is noted to have some irregularity of the people of the left eye.  Medical screening examination/treatment/procedure(s) were conducted as a shared visit with non-physician practitioner(s) and myself.  I personally evaluated the patient during the encounter.   EKG Interpretation   Date/Time:  Friday December 14 2013 12:24:34 EST Ventricular Rate:  66 PR Interval:  183 QRS Duration: 99 QT Interval:  425 QTC Calculation: 445 R Axis:   15 Text Interpretation:  Sinus rhythm LVH with secondary repolarization  abnormality Inferior infarct, old No significant change since last tracing  28 Mar 2000 Confirmed by [facility]  MD-I, Mushka Laconte ([id number]) on 12/14/2013 12:27:51  PM       Rolland Porter, MD, Alanson Aly, MD 12/14/13 [place] Kinsly Hild, MD 12/14/13 1232

## 2013-12-15 ENCOUNTER — Inpatient Hospital Stay (HOSPITAL_COMMUNITY): Payer: Medicare Other

## 2013-12-15 ENCOUNTER — Encounter (HOSPITAL_COMMUNITY): Payer: Self-pay | Admitting: Radiology

## 2013-12-15 DIAGNOSIS — I359 Nonrheumatic aortic valve disorder, unspecified: Secondary | ICD-10-CM

## 2013-12-15 DIAGNOSIS — I10 Essential (primary) hypertension: Secondary | ICD-10-CM

## 2013-12-15 DIAGNOSIS — G936 Cerebral edema: Secondary | ICD-10-CM

## 2013-12-15 DIAGNOSIS — G9340 Encephalopathy, unspecified: Secondary | ICD-10-CM | POA: Insufficient documentation

## 2013-12-15 DIAGNOSIS — I611 Nontraumatic intracerebral hemorrhage in hemisphere, cortical: Principal | ICD-10-CM

## 2013-12-15 DIAGNOSIS — Z23 Encounter for immunization: Secondary | ICD-10-CM | POA: Insufficient documentation

## 2013-12-15 DIAGNOSIS — Z452 Encounter for adjustment and management of vascular access device: Secondary | ICD-10-CM | POA: Insufficient documentation

## 2013-12-15 LAB — GLUCOSE, CAPILLARY
GLUCOSE-CAPILLARY: 132 mg/dL — AB (ref 70–99)
Glucose-Capillary: 114 mg/dL — ABNORMAL HIGH (ref 70–99)
Glucose-Capillary: 115 mg/dL — ABNORMAL HIGH (ref 70–99)
Glucose-Capillary: 125 mg/dL — ABNORMAL HIGH (ref 70–99)
Glucose-Capillary: 126 mg/dL — ABNORMAL HIGH (ref 70–99)
Glucose-Capillary: 130 mg/dL — ABNORMAL HIGH (ref 70–99)

## 2013-12-15 LAB — BASIC METABOLIC PANEL
Anion gap: 17 — ABNORMAL HIGH (ref 5–15)
BUN: 25 mg/dL — ABNORMAL HIGH (ref 6–23)
CHLORIDE: 97 meq/L (ref 96–112)
CO2: 23 meq/L (ref 19–32)
Calcium: 8.6 mg/dL (ref 8.4–10.5)
Creatinine, Ser: 1.02 mg/dL (ref 0.50–1.35)
GFR calc Af Amer: 78 mL/min — ABNORMAL LOW (ref >90)
GFR calc non Af Amer: 67 mL/min — ABNORMAL LOW (ref >90)
Glucose, Bld: 133 mg/dL — ABNORMAL HIGH (ref 70–99)
POTASSIUM: 3.9 meq/L (ref 3.7–5.3)
Sodium: 137 mEq/L (ref 137–147)

## 2013-12-15 LAB — HEMOGLOBIN A1C
Hgb A1c MFr Bld: 6.4 % — ABNORMAL HIGH (ref <5.7)
MEAN PLASMA GLUCOSE: 137 mg/dL — AB (ref <117)

## 2013-12-15 LAB — LIPID PANEL
CHOL/HDL RATIO: 2.7 ratio
Cholesterol: 147 mg/dL (ref 0–200)
HDL: 54 mg/dL (ref >39)
LDL Cholesterol: 76 mg/dL (ref 0–99)
Triglycerides: 84 mg/dL (ref <150)
VLDL: 17 mg/dL (ref 0–40)

## 2013-12-15 LAB — CBC
HCT: 39.5 % (ref 39.0–52.0)
Hemoglobin: 12.8 g/dL — ABNORMAL LOW (ref 13.0–17.0)
MCH: 30.3 pg (ref 26.0–34.0)
MCHC: 32.4 g/dL (ref 30.0–36.0)
MCV: 93.6 fL (ref 78.0–100.0)
PLATELETS: 239 10*3/uL (ref 150–400)
RBC: 4.22 MIL/uL (ref 4.22–5.81)
RDW: 13.9 % (ref 11.5–15.5)
WBC: 13.7 10*3/uL — AB (ref 4.0–10.5)

## 2013-12-15 LAB — TSH: TSH: 1.82 u[IU]/mL (ref 0.350–4.500)

## 2013-12-15 LAB — SODIUM
SODIUM: 133 meq/L — AB (ref 137–147)
Sodium: 139 mEq/L (ref 137–147)

## 2013-12-15 LAB — VITAMIN B12: Vitamin B-12: 620 pg/mL (ref 211–911)

## 2013-12-15 LAB — T4, FREE: Free T4: 1.22 ng/dL (ref 0.80–1.80)

## 2013-12-15 MED ORDER — SODIUM CHLORIDE 3 % IV SOLN
75.0000 mL/h | INTRAVENOUS | Status: DC
  Administered 2013-12-15 – 2013-12-17 (×6): 75 mL/h via INTRAVENOUS
  Administered 2013-12-17: 37.5 mL/h via INTRAVENOUS
  Administered 2013-12-18: 18.8 mL/h via INTRAVENOUS
  Filled 2013-12-15 (×12): qty 500

## 2013-12-15 MED ORDER — PANTOPRAZOLE SODIUM 40 MG PO TBEC
40.0000 mg | DELAYED_RELEASE_TABLET | Freq: Every day | ORAL | Status: DC
  Administered 2013-12-15 – 2013-12-20 (×6): 40 mg via ORAL
  Filled 2013-12-15 (×6): qty 1

## 2013-12-15 NOTE — Evaluation (Signed)
Clinical/Bedside Swallow Evaluation Patient Details  Name: Shareef Gangl MRN: [id number] Date of Birth: 1931/04/26  Today's Date: 12/15/2013 Time: 0900-1000 SLP Time Calculation (min) (ACUTE ONLY): 60 min  Past Medical History:  Past Medical History  Diagnosis Date  . Hypertension    Past Surgical History:  Past Surgical History  Procedure Laterality Date  . Abdominal surgery     HPI:  80 year old male admitted 12/14/13 due to AMS. PMH significant for HTN. CT revealed right parietal hemorragic infarct. BSE ordered to evaluate swallow function and safety.   Assessment / Plan / Recommendation Clinical Impression  Oral care completed with suction. Oral motor strength and function appear adequate. No overt s/s aspiration observed with any consistency presented, however, pt was noted to be impulsive and required cues to minimize bolus rate and size. Pt alert, but has highly variable alertness levels. Will begin dys 2 with thin liquids for energy conservation and to minimize impulsivity. ST to follow for diet tolerance and readiness to advance solid consistencies. Precautions posted at [facility] and reviewed with pt, family, and RN.    Aspiration Risk  Mild    Diet Recommendation Dysphagia 2 (Fine chop);Thin liquid   Liquid Administration via: Cup;Straw Medication Administration: Whole meds with puree Supervision: Patient able to self feed;Staff to assist with self feeding;Full supervision/cueing for compensatory strategies Compensations: Slow rate;Small sips/bites Postural Changes and/or Swallow Maneuvers: Seated upright 90 degrees;Upright 30-60 min after meal    Other  Recommendations Oral Care Recommendations: Oral care Q4 per protocol Other Recommendations: Clarify dietary restrictions   Follow Up Recommendations   (TBD)    Frequency and Duration min 2x/week  1 week   Pertinent Vitals/Pain VSS, no pain reported    SLP Swallow Goals  diet tolerance   Swallow Study Prior  Functional Status   No prior history of dysphagia. Pt tolerated regular diet with thin liquids prior to admit.    General Date of Onset: 12/14/13 HPI: 80 year old male admitted 12/14/13 due to AMS. PMH significant for HTN. CT revealed right parietal hemorragic infarct. BSE ordered to evaluate swallow function and safety. Type of Study: Bedside swallow evaluation Previous Swallow Assessment: none found Diet Prior to this Study: NPO Temperature Spikes Noted: No Respiratory Status: Room air History of Recent Intubation: No Behavior/Cognition: Alert;Cooperative;Hard of hearing;Pleasant mood;Requires cueing Oral Cavity - Dentition: Adequate natural dentition Self-Feeding Abilities: Able to feed self;Needs assist Patient Positioning: Upright in bed Baseline Vocal Quality: Clear Volitional Cough: Weak Volitional Swallow: Unable to elicit    Oral/Motor/Sensory Function Overall Oral Motor/Sensory Function: Appears within functional limits for tasks assessed   Ice Chips Ice chips: Within functional limits Presentation: Spoon   Thin Liquid Thin Liquid: Within functional limits Presentation: Cup;Straw    Nectar Thick Nectar Thick Liquid: Not tested   Honey Thick Honey Thick Liquid: Not tested   Puree Puree: Within functional limits Presentation: Annabella B. Quentin Ore [facility], CCC-SLP [id number] [phone] Solid: Within functional limits       Shonna Chock 12/15/2013,10:08 AM

## 2013-12-15 NOTE — Progress Notes (Signed)
  Echocardiogram 2D Echocardiogram has been performed.  Lysle Rubens 12/15/2013, 3:50 PM

## 2013-12-15 NOTE — Progress Notes (Signed)
PT Cancellation Note  Patient Details Name: Brian Wolf MRN: [id number] DOB: 1931/10/17   Cancelled Treatment:    Reason Eval/Treat Not Completed: Patient not medically ready (pt currently on bedrest and await increased activity order)   Lanetta Inch Beth 12/15/2013, 7:14 AM Elwyn Reach, [place]

## 2013-12-15 NOTE — Progress Notes (Signed)
[place] Progress Note Patient Name: Brian Wolf DOB: 11/03/1931 MRN: [id number]   Date of Service  12/15/2013  HPI/Events of Note   X-ray Reviewed for CL placement   eICU Interventions   CVL approved for use      Intervention Category Minor Interventions: Routine modifications to care plan (e.g. PRN medications for pain, fever)  Kimberli Winne R. 12/15/2013, 4:52 PM

## 2013-12-15 NOTE — Progress Notes (Signed)
STROKE TEAM PROGRESS NOTE   HISTORY Brian Wolf is an 80 y.o. male who was noted yesterday to by wife that he was acting strange. He was complaining of a right temporal pain when he coughed and seemed off balance. She noted he went out to the drive way and seemed to be walking to the right and then was not talking sensibly. Wife also noted some weakness in his left hand. This AM she brought him to his PCP who directed them to the ED. On arrival to ED initial CT scan showed a large right acute hemorraghic infarct. Currently patient is complaining of a slight pain on the right temple, and that he is thirsty. He will answer some questions but desires to not take part exam.   Date last known well: Date: 12/13/2013 Time last known well: Unable to determine tPA Given: No: ICH   SUBJECTIVE (INTERVAL HISTORY) His son and wife are at the bedside.  Overall he feels his condition is unchanged. He is still drowsy but arousable and answer some simple questions. Denies HA, N/V. Repeat CT head showed stable hematoma but increased edema. Answered questions from family and will go ahead to have central line and 3% saline.   OBJECTIVE Temp:  [98.5 F (36.9 C)-101.9 F (38.8 C)] 99.1 F (37.3 C) (11/14 0500) Pulse Rate:  [62-119] 76 (11/14 1000) Cardiac Rhythm:  [-]  Resp:  [18-37] 22 (11/14 1000) BP: (137-186)/(41-130) 166/62 mmHg (11/14 1000) SpO2:  [88 %-100 %] 100 % (11/14 1000)   Recent Labs Lab 12/14/13 1951 12/15/13 0017 12/15/13 0432 12/15/13 0757  GLUCAP 128* 125* 130* 126*    Recent Labs Lab 12/14/13 1137 12/14/13 1550 12/15/13 0342  NA 138 137 137  K 4.2 4.1 3.9  CL 98 98 97  CO2 26 23 23   GLUCOSE 129* 120* 133*  BUN 26* 26* 25*  CREATININE 0.92 0.88 1.02  CALCIUM 9.3 8.9 8.6    Recent Labs Lab 12/14/13 1137 12/14/13 1550  AST 24 26  ALT 18 18  ALKPHOS 46 45  BILITOT 0.7 0.7  PROT 8.2 7.8  ALBUMIN 3.7 3.5    Recent Labs Lab 12/14/13 1137 12/14/13 1550  12/15/13 0342  WBC 13.5* 16.0* 13.7*  NEUTROABS 9.9* 11.2*  --   HGB 13.5 13.3 12.8*  HCT 40.8 39.7 39.5  MCV 91.3 91.1 93.6  PLT 279 PLATELETS APPEAR ADEQUATE 239   No results for input(s): CKTOTAL, CKMB, CKMBINDEX, TROPONINI in the last 168 hours.  Recent Labs  12/14/13 1340 12/14/13 2100  LABPROT 13.7 13.7  INR 1.03 1.04    Recent Labs  12/14/13 1356  COLORURINE YELLOW  LABSPEC 1.021  PHURINE 6.5  GLUCOSEU NEGATIVE  HGBUR TRACE*  BILIRUBINUR NEGATIVE  KETONESUR NEGATIVE  PROTEINUR 100*  UROBILINOGEN 0.2  NITRITE NEGATIVE  LEUKOCYTESUR NEGATIVE       Component Value Date/Time   CHOL 147 12/15/2013 0342   TRIG 84 12/15/2013 0342   HDL 54 12/15/2013 0342   CHOLHDL 2.7 12/15/2013 0342   VLDL 17 12/15/2013 0342   LDLCALC 76 12/15/2013 0342   No results found for: HGBA1C No results found for: LABOPIA, COCAINSCRNUR, LABBENZ, AMPHETMU, THCU, LABBARB  No results for input(s): ETH in the last 168 hours.  I have personally reviewed the radiological images below and agree with the radiology interpretations.  Dg Chest 2 View  12/14/2013   IMPRESSION: Enlargement of cardiac silhouette.  Minimal bronchitic changes and LEFT basilar atelectasis.     Ct Head  Wo Contrast  12/15/2013   IMPRESSION: Evolving RIGHT parietal intraparenchymal hematoma with resultant 4 mm RIGHT to LEFT midline shift. No ventricular entrapment/ hydrocephalus.  Severe white matter changes likely reflect chronic small vessel ischemic disease.      12/14/2013   IMPRESSION: Large acute hemorrhagic infarct in the posterior RIGHT parietal lobe, area of hemorrhage 5.3 x 3.3 cm with surrounding vasogenic edema and minimal RIGHT LEFT midline shift.  Underlying atrophy and small vessel chronic ischemic changes of deep cerebral white matter.     2D echo - pending   PHYSICAL EXAM Physical exam  Temp:  [98.5 F (36.9 C)-101.9 F (38.8 C)] 99.1 F (37.3 C) (11/14 0500) Pulse Rate:  [62-119] 76 (11/14  1000) Resp:  [18-37] 22 (11/14 1000) BP: (137-186)/(41-130) 166/62 mmHg (11/14 1000) SpO2:  [88 %-100 %] 100 % (11/14 1000)  General - Well nourished, well developed, drowsy but arousable.  Ophthalmologic - not able to see through.  Cardiovascular - Regular rate and rhythm with no murmur.  Neuro - drowsy but arousable to voice, able to follow simple commands, paucity of language, not able to test naming and repeating, but able to follow simple commands and answer some questions briefly. Orientated to place (said in clinic), but not to year or people. PERRL, EOMI, not blinking to visual threat on the left, moving all extremities bilaterally, reflex 2+, babinski testing not cooperative but mute bilaterally.   ASSESSMENT/PLAN [name] is a 80 y.o. male with history of HTN presenting with left side weakness, confusion and off balance with HA. He did not receive IV t-PA due to [place].    ICH:  Non-dominant right parietofrontal hemorrhage ~ 40cc, etiology unclear but likely due to HTN vs. CAA  CT showed left large frontoparietal bleeding  Repeat CT showed stable hematoma  2D Echo  pending  LDL 76, not at goal < 70  HgbA1c pending  SCDs for VTE prophylaxis  DIET DYS 2   aspirin 81 mg orally every day prior to admission, now on no antithrombotics  Ongoing aggressive risk factor management  Therapy recommendations:  pending  Disposition:  Pending  Cerebral edema - repeat CT showed increased surrounding edema - discussed with family, agree with hypertonic saline - will do central line with 3% saline - Na goal 150-160 - if pt mental status getting worse or edema with more midline shift, may consider surgical evacuation. Family agree with surgical intervention if needed.  Hypertension  Home meds:   HCTZ, losartan and metoprolo, resume in hosptial  Unstable  BP goal < 160  Labetalol PRN  cardene drip if needed  Hyperlipidemia  Home meds:  none  LDL 76, not  at goal < 70  Hold off statin now due to [place]  Other Stroke Risk Factors  Advanced age  Questionable dementia at home  Hospital day # 1  This patient is critically ill due to large right ICH and cerebral edema and at significant risk of neurological worsening, death form recurrent bleeding, cerebral edema and brain herniation. This patient's care requires constant monitoring of vital signs, hemodynamics, respiratory and cardiac monitoring, review of multiple databases, neurological assessment, discussion with family, other specialists and medical decision making of high complexity. I had long discussion with pt son regarding the clinical decision, treatment plan and prognosis. I spent 50 minutes of neurocritical care time in the care of this patient.  Rosalin Hawking, MD PhD Stroke Neurology 12/15/2013 10:52 AM      To contact  Stroke Continuity provider, please refer to [email or web address] After hours, contact General Neurology

## 2013-12-15 NOTE — Procedures (Signed)
Central Venous Catheter Insertion Procedure Note Brian Wolf [id number] [birth year]  Procedure: Insertion of Central Venous Catheter Indications: Assessment of intravascular volume and Drug and/or fluid administration  Procedure Details Consent: Risks of procedure as well as the alternatives and risks of each were explained to the (patient/caregiver).  Consent for procedure obtained. Time Out: Verified patient identification, verified procedure, site/side was marked, verified correct patient position, special equipment/implants available, medications/allergies/relevent history reviewed, required imaging and test results available.  Performed  Maximum sterile technique was used including antiseptics, cap, gloves, gown, hand hygiene, mask and sheet. Skin prep: Chlorhexidine; local anesthetic administered A antimicrobial bonded/coated triple lumen catheter was placed in the left internal jugular vein using the Seldinger technique.  Ultrasound was used to verify the patency of the vein and for real time needle guidance.  Evaluation Blood flow good Complications: No apparent complications Patient did tolerate procedure well. Chest X-ray ordered to verify placement.  CXR: pending.  Debrah Granderson 12/15/2013, 2:01 PM

## 2013-12-16 ENCOUNTER — Inpatient Hospital Stay (HOSPITAL_COMMUNITY): Payer: Medicare Other

## 2013-12-16 DIAGNOSIS — I610 Nontraumatic intracerebral hemorrhage in hemisphere, subcortical: Secondary | ICD-10-CM

## 2013-12-16 DIAGNOSIS — E785 Hyperlipidemia, unspecified: Secondary | ICD-10-CM

## 2013-12-16 LAB — URINALYSIS W MICROSCOPIC (NOT AT ARMC)
Bilirubin Urine: NEGATIVE
Glucose, UA: NEGATIVE mg/dL
Ketones, ur: NEGATIVE mg/dL
NITRITE: POSITIVE — AB
Protein, ur: 30 mg/dL — AB
Specific Gravity, Urine: 1.017 (ref 1.005–1.030)
Urobilinogen, UA: 0.2 mg/dL (ref 0.0–1.0)
pH: 5.5 (ref 5.0–8.0)

## 2013-12-16 LAB — CBC
HCT: 31.4 % — ABNORMAL LOW (ref 39.0–52.0)
Hemoglobin: 10.5 g/dL — ABNORMAL LOW (ref 13.0–17.0)
MCH: 31.2 pg (ref 26.0–34.0)
MCHC: 33.4 g/dL (ref 30.0–36.0)
MCV: 93.2 fL (ref 78.0–100.0)
PLATELETS: 194 10*3/uL (ref 150–400)
RBC: 3.37 MIL/uL — ABNORMAL LOW (ref 4.22–5.81)
RDW: 13.7 % (ref 11.5–15.5)
WBC: 12.8 10*3/uL — ABNORMAL HIGH (ref 4.0–10.5)

## 2013-12-16 LAB — SODIUM
SODIUM: 138 meq/L (ref 137–147)
Sodium: 139 mEq/L (ref 137–147)

## 2013-12-16 LAB — URINE CULTURE: Colony Count: >=100000

## 2013-12-16 LAB — BASIC METABOLIC PANEL
ANION GAP: 11 (ref 5–15)
BUN: 21 mg/dL (ref 6–23)
CALCIUM: 7.4 mg/dL — AB (ref 8.4–10.5)
CO2: 25 mEq/L (ref 19–32)
CREATININE: 0.96 mg/dL (ref 0.50–1.35)
Chloride: 103 mEq/L (ref 96–112)
GFR calc Af Amer: 88 mL/min — ABNORMAL LOW (ref >90)
GFR, EST NON AFRICAN AMERICAN: 76 mL/min — AB (ref >90)
Glucose, Bld: 125 mg/dL — ABNORMAL HIGH (ref 70–99)
Potassium: 3.4 mEq/L — ABNORMAL LOW (ref 3.7–5.3)
Sodium: 139 mEq/L (ref 137–147)

## 2013-12-16 LAB — GLUCOSE, CAPILLARY
GLUCOSE-CAPILLARY: 120 mg/dL — AB (ref 70–99)
GLUCOSE-CAPILLARY: 130 mg/dL — AB (ref 70–99)
Glucose-Capillary: 112 mg/dL — ABNORMAL HIGH (ref 70–99)

## 2013-12-16 MED ORDER — NICARDIPINE HCL IN NACL 20-0.86 MG/200ML-% IV SOLN: 3.0000 mg/h | INTRAVENOUS | Status: DC

## 2013-12-16 MED ORDER — METOPROLOL TARTRATE 50 MG PO TABS
50.0000 mg | ORAL_TABLET | Freq: Two times a day (BID) | ORAL | Status: DC
  Administered 2013-12-16 – 2013-12-20 (×9): 50 mg via ORAL
  Filled 2013-12-16 (×10): qty 1

## 2013-12-16 MED ORDER — NICARDIPINE HCL IN NACL 20-0.86 MG/200ML-% IV SOLN
3.0000 mg/h | INTRAVENOUS | Status: DC
  Administered 2013-12-16 (×3): 5 mg/h via INTRAVENOUS
  Filled 2013-12-16 (×2): qty 200

## 2013-12-16 MED ORDER — METOPROLOL TARTRATE 50 MG PO TABS
75.0000 mg | ORAL_TABLET | Freq: Two times a day (BID) | ORAL | Status: DC
  Filled 2013-12-16 (×2): qty 1

## 2013-12-16 MED ORDER — AMLODIPINE BESYLATE 10 MG PO TABS
10.0000 mg | ORAL_TABLET | Freq: Every day | ORAL | Status: DC
  Filled 2013-12-16 (×2): qty 1

## 2013-12-16 MED ORDER — CIPROFLOXACIN HCL 500 MG PO TABS
500.0000 mg | ORAL_TABLET | Freq: Two times a day (BID) | ORAL | Status: DC
  Administered 2013-12-16 – 2013-12-17 (×3): 500 mg via ORAL
  Filled 2013-12-16 (×6): qty 1

## 2013-12-16 NOTE — Plan of Care (Signed)
Problem: Acute Treatment Outcomes Goal: BP within ordered parameters Outcome: Progressing     

## 2013-12-16 NOTE — Plan of Care (Signed)
Problem: Acute Treatment Outcomes Goal: 02 Sats > 94% Outcome: Completed/Met Date Met:  12/16/13     

## 2013-12-16 NOTE — Progress Notes (Signed)
PT Cancellation Note  Patient Details Name: Brian Wolf MRN: [id number] DOB: [birth year]   Cancelled Treatment:    Reason Eval/Treat Not Completed: Patient not medically ready.  Pt currently on bedrest.  Please advance activity if appropriate for PT eval.  Thanks.     Ezell Poke, Thornton Papas 12/16/2013, 7:53 AM

## 2013-12-16 NOTE — Progress Notes (Signed)
STROKE TEAM PROGRESS NOTE   HISTORY Brian Wolf is an 80 y.o. male who was noted yesterday to by wife that he was acting strange. He was complaining of a right temporal pain when he coughed and seemed off balance. She noted he went out to the drive way and seemed to be walking to the right and then was not talking sensibly. Wife also noted some weakness in his left hand. This AM she brought him to his PCP who directed them to the ED. On arrival to ED initial CT scan showed a large right acute hemorraghic infarct. Currently patient is complaining of a slight pain on the right temple, and that he is thirsty. He will answer some questions but desires to not take part exam.   Date last known well: Date: 12/13/2013 Time last known well: Unable to determine tPA Given: No: ICH   SUBJECTIVE (INTERVAL HISTORY) His son and wife are at the bedside.  Overall he feels his condition is much improved. Awake alert and conversing well. Some confusion episodes overnight and repeat CT showed no significant change. He had fever this am and urine cloudy. UA and pan culture sent out. BP up to 200s and cardene drip ordered.   OBJECTIVE Temp:  [98.3 F (36.8 C)-101.9 F (38.8 C)] 101.9 F (38.8 C) (11/15 0832) Pulse Rate:  [65-81] 71 (11/15 1100) Cardiac Rhythm:  [-] Normal sinus rhythm (11/14 2000) Resp:  [17-28] 24 (11/15 1100) BP: (111-204)/(43-146) 139/54 mmHg (11/15 1100) SpO2:  [92 %-100 %] 99 % (11/15 1100) FiO2 (%):  [40 %] 40 % (11/15 0200) Weight:  [170 lb 6.7 oz (77.3 kg)] 170 lb 6.7 oz (77.3 kg) (11/14 1500)   Recent Labs Lab 12/15/13 0432 12/15/13 0757 12/15/13 1203 12/15/13 1623 12/15/13 2025  GLUCAP 130* 126* 132* 115* 114*    Recent Labs Lab 12/14/13 1137 12/14/13 1550 12/15/13 0342 12/15/13 1301 12/15/13 1830 12/16/13 0400 12/16/13 0920  NA 138 137 137 133* 139 139 139  K 4.2 4.1 3.9  --   --  3.4*  --   CL 98 98 97  --   --  103  --   CO2 26 23 23   --   --  25  --    GLUCOSE 129* 120* 133*  --   --  125*  --   BUN 26* 26* 25*  --   --  21  --   CREATININE 0.92 0.88 1.02  --   --  0.96  --   CALCIUM 9.3 8.9 8.6  --   --  7.4*  --     Recent Labs Lab 12/14/13 1137 12/14/13 1550  AST 24 26  ALT 18 18  ALKPHOS 46 45  BILITOT 0.7 0.7  PROT 8.2 7.8  ALBUMIN 3.7 3.5    Recent Labs Lab 12/14/13 1137 12/14/13 1550 12/15/13 0342 12/16/13 0400  WBC 13.5* 16.0* 13.7* 12.8*  NEUTROABS 9.9* 11.2*  --   --   HGB 13.5 13.3 12.8* 10.5*  HCT 40.8 39.7 39.5 31.4*  MCV 91.3 91.1 93.6 93.2  PLT 279 PLATELETS APPEAR ADEQUATE 239 194   No results for input(s): CKTOTAL, CKMB, CKMBINDEX, TROPONINI in the last 168 hours.  Recent Labs  12/14/13 1340 12/14/13 2100  LABPROT 13.7 13.7  INR 1.03 1.04    Recent Labs  12/14/13 1356 12/16/13 0929  COLORURINE YELLOW YELLOW  LABSPEC 1.021 1.017  PHURINE 6.5 5.5  GLUCOSEU NEGATIVE NEGATIVE  HGBUR TRACE* MODERATE*  BILIRUBINUR  NEGATIVE NEGATIVE  KETONESUR NEGATIVE NEGATIVE  PROTEINUR 100* 30*  UROBILINOGEN 0.2 0.2  NITRITE NEGATIVE POSITIVE*  LEUKOCYTESUR NEGATIVE MODERATE*       Component Value Date/Time   CHOL 147 12/15/2013 0342   TRIG 84 12/15/2013 0342   HDL 54 12/15/2013 0342   CHOLHDL 2.7 12/15/2013 0342   VLDL 17 12/15/2013 0342   LDLCALC 76 12/15/2013 0342   Lab Results  Component Value Date   HGBA1C 6.4* 12/15/2013   No results found for: LABOPIA, COCAINSCRNUR, LABBENZ, AMPHETMU, THCU, LABBARB  No results for input(s): ETH in the last 168 hours.  I have personally reviewed the radiological images below and agree with the radiology interpretations.  Dg Chest 2 View  12/14/2013   IMPRESSION: Enlargement of cardiac silhouette.  Minimal bronchitic changes and LEFT basilar atelectasis.     Ct Head Wo Contrast  12/15/13 - Evolving, relatively stable RIGHT parietal intraparenchymal hematoma with resultant 3 mm RIGHT to LEFT midline shift. Mild RIGHT lateral ventricle effacement  without entrapment or hydrocephalus. Moderate to severe white matter changes suggest chronic small vessel ischemic disease.  12/15/2013   IMPRESSION: Evolving RIGHT parietal intraparenchymal hematoma with resultant 4 mm RIGHT to LEFT midline shift. No ventricular entrapment/ hydrocephalus.  Severe white matter changes likely reflect chronic small vessel ischemic disease.      12/14/2013   IMPRESSION: Large acute hemorrhagic infarct in the posterior RIGHT parietal lobe, area of hemorrhage 5.3 x 3.3 cm with surrounding vasogenic edema and minimal RIGHT LEFT midline shift.  Underlying atrophy and small vessel chronic ischemic changes of deep cerebral white matter.     2D echo - - Left ventricle: The cavity size was normal. Wall thickness was normal. Systolic function was normal. The estimated ejection fraction was in the range of 55% to 60%. Wall motion was normal; there were no regional wall motion abnormalities. Doppler parameters are consistent with abnormal left ventricular relaxation (grade 1 diastolic dysfunction). - Aortic valve: There was mild regurgitation. - Left atrium: The atrium was moderately dilated. - Pulmonary arteries: Systolic pressure was mildly to moderately increased. PA peak pressure: 40 mm Hg (S).   PHYSICAL EXAM  Temp:  [98.3 F (36.8 C)-101.9 F (38.8 C)] 101.9 F (38.8 C) (11/15 0832) Pulse Rate:  [65-81] 71 (11/15 1100) Resp:  [17-28] 24 (11/15 1100) BP: (111-204)/(43-146) 139/54 mmHg (11/15 1100) SpO2:  [92 %-100 %] 99 % (11/15 1100) FiO2 (%):  [40 %] 40 % (11/15 0200) Weight:  [170 lb 6.7 oz (77.3 kg)] 170 lb 6.7 oz (77.3 kg) (11/14 1500)  General - Well nourished, well developed, drowsy but arousable.  Ophthalmologic - not able to see through.  Cardiovascular - Regular rate and rhythm with no murmur.  Mental Status -  Level of arousal and orientation to time, place, and person were intact. Language including expression, naming,  repetition, comprehension was assessed and found intact.  Cranial Nerves II - XII - II - left visual field deficit/neglect. III, IV, VI - Extraocular movements intact. V - Facial sensation intact bilaterally. VII - Facial movement intact bilaterally. VIII - Hearing & vestibular intact bilaterally. X - Palate elevates symmetrically. XI - [place] turning & shoulder shrug intact bilaterally. XII - Tongue protrusion intact.  Motor Strength - The patient's strength was normal in all extremities except LUE pronator drift was present.  Bulk was normal and fasciculations were absent.   Motor Tone - Muscle tone was assessed at the neck and appendages and was normal.  Reflexes - The  patient's reflexes were normal in all extremities and he had no pathological reflexes.  Sensory - Light touch, temperature/pinprick were assessed and were normal.    Coordination - The patient had normal movements in the hands with no ataxia or dysmetria.  Tremor was absent.  Gait and Station - not tested due to labile BP.   ASSESSMENT/PLAN [name] is a 80 y.o. male with history of HTN presenting with left side weakness, confusion and off balance with HA. He did not receive IV t-PA due to [place].    ICH:  Non-dominant right parietofrontal hemorrhage ~ 40cc, etiology unclear but likely due to HTN vs. CAA  CT showed left large frontoparietal bleeding  Repeat CT showed stable hematoma  MRI and MRA to be considered later to rule out tumor, AVM and CAA  2D Echo  unremarkable  LDL 76, not at goal < 70  HgbA1c 6.4, at the goal < 6.5  SCDs for VTE prophylaxis  DIET DYS 2   aspirin 81 mg orally every day prior to admission, now on no antithrombotics  Ongoing aggressive risk factor management  Therapy recommendations:  pending  Disposition:  Pending  Cerebral edema - repeat CT showed increased surrounding edema - discussed with family, agree with hypertonic saline - on 3% saline at 75cc/h - Na  goal 150-160, today 139 - if pt mental status getting worse or edema with more midline shift, may consider surgical evacuation. Family agree with surgical intervention if needed. Dr. Christella Noa from [place] notified.  Fever  - Tmax 101.9 - cloudy urine, UA and culture sent out - cipro 500mg  bid - blood culture sent out also  Hypertension  Home meds:   HCTZ, losartan and metoprolo, resume in hosptial  Unstable  BP goal < 160  Ordered cardene drip, currently off cardene drip  Hyperlipidemia  Home meds:  none  LDL 76, not at goal < 70  Hold off statin now due to [place]  Other Stroke Risk Factors  Advanced age  Hospital day # 2  This patient is critically ill due to large right ICH, fever and cerebral edema and at significant risk of neurological worsening, death form recurrent bleeding, cerebral edema and brain herniation. This patient's care requires constant monitoring of vital signs, hemodynamics, respiratory and cardiac monitoring, review of multiple databases, neurological assessment, discussion with family, other specialists and medical decision making of high complexity. I had long discussion with pt son regarding the clinical decision, treatment plan and prognosis. I spent 35 minutes of neurocritical care time in the care of this patient.  Rosalin Hawking, MD PhD Stroke Neurology 12/16/2013 11:22 AM      To contact Stroke Continuity provider, please refer to [email or web address] After hours, contact General Neurology

## 2013-12-16 NOTE — Progress Notes (Signed)
Neurology end of shift note:  Called by nurse last night regarding patient decreased awareness. CT brain requested and it was unchanged. Suspect fever could be also playing a role. Then, few hours later was called again due to prolong periods of apnea with desaturation. I think this could be due to central apneas. Patient did well with a venti mask.  Dorian Pod, MD

## 2013-12-16 NOTE — Clinical Social Work Note (Signed)
CSW made aware of son's request for letter to present to airline. Son is currently attempting to obtain an airline credit for patient's flight due to patient's admission and medical state. CSW to follow tomorrow.  [place], [place] Weekend Clinical Social Worker [phone]

## 2013-12-17 ENCOUNTER — Inpatient Hospital Stay (HOSPITAL_COMMUNITY): Payer: Medicare Other

## 2013-12-17 DIAGNOSIS — R4182 Altered mental status, unspecified: Secondary | ICD-10-CM | POA: Insufficient documentation

## 2013-12-17 DIAGNOSIS — R410 Disorientation, unspecified: Secondary | ICD-10-CM

## 2013-12-17 LAB — GLUCOSE, CAPILLARY
GLUCOSE-CAPILLARY: 123 mg/dL — AB (ref 70–99)
GLUCOSE-CAPILLARY: 127 mg/dL — AB (ref 70–99)
GLUCOSE-CAPILLARY: 140 mg/dL — AB (ref 70–99)
Glucose-Capillary: 124 mg/dL — ABNORMAL HIGH (ref 70–99)
Glucose-Capillary: 115 mg/dL — ABNORMAL HIGH (ref 70–99)

## 2013-12-17 LAB — CBC
HEMATOCRIT: 31.2 % — AB (ref 39.0–52.0)
HEMOGLOBIN: 10.1 g/dL — AB (ref 13.0–17.0)
MCH: 30.1 pg (ref 26.0–34.0)
MCHC: 32.4 g/dL (ref 30.0–36.0)
MCV: 93.1 fL (ref 78.0–100.0)
Platelets: 201 10*3/uL (ref 150–400)
RBC: 3.35 MIL/uL — ABNORMAL LOW (ref 4.22–5.81)
RDW: 13.8 % (ref 11.5–15.5)
WBC: 12.9 10*3/uL — ABNORMAL HIGH (ref 4.0–10.5)

## 2013-12-17 LAB — BASIC METABOLIC PANEL
Anion gap: 11 (ref 5–15)
BUN: 16 mg/dL (ref 6–23)
CO2: 25 mEq/L (ref 19–32)
Calcium: 7.1 mg/dL — ABNORMAL LOW (ref 8.4–10.5)
Chloride: 109 mEq/L (ref 96–112)
Creatinine, Ser: 0.86 mg/dL (ref 0.50–1.35)
GFR calc Af Amer: >90 mL/min (ref >90)
GFR calc non Af Amer: 80 mL/min — ABNORMAL LOW (ref >90)
GLUCOSE: 113 mg/dL — AB (ref 70–99)
POTASSIUM: 3 meq/L — AB (ref 3.7–5.3)
Sodium: 145 mEq/L (ref 137–147)

## 2013-12-17 LAB — SODIUM
SODIUM: 140 meq/L (ref 137–147)
SODIUM: 140 meq/L (ref 137–147)
Sodium: 141 mEq/L (ref 137–147)

## 2013-12-17 MED ORDER — HYDRALAZINE HCL 20 MG/ML IJ SOLN
25.0000 mg | Freq: Three times a day (TID) | INTRAMUSCULAR | Status: DC | PRN
  Administered 2013-12-17 (×2): 25 mg via INTRAVENOUS
  Filled 2013-12-17 (×2): qty 2

## 2013-12-17 MED ORDER — LEVOFLOXACIN 250 MG PO TABS
250.0000 mg | ORAL_TABLET | Freq: Every day | ORAL | Status: DC
  Administered 2013-12-17 – 2013-12-19 (×3): 250 mg via ORAL
  Filled 2013-12-17 (×4): qty 1

## 2013-12-17 NOTE — Progress Notes (Signed)
Speech Language Pathology Treatment: Dysphagia  Patient Details Name: Brian Wolf MRN: [id number] DOB: [birth year] Today's Date: 12/17/2013 Time: 9030-1499 SLP Time Calculation (min) (ACUTE ONLY): 18 min  Assessment / Plan / Recommendation Clinical Impression  Pt tolerating thin liquids without evidence of aspiration or any dysphagia. Pt has been eating foods brought by family, tolerating regular texture. He still needs assist with positioning the bed upright and basic safety precautions, but can be upgraded to regular texture with thin liquids. SLP to f/u for cognition, will sign off for swallowing.    HPI HPI: 80 year old male admitted 12/14/13 due to AMS. PMH significant for HTN. CT revealed right parietal hemorragic infarct. CXR shows Cardiomegaly with mild pulmonary vascular congestion. SLE ordered to evaluate cognitive-linguistic functioning.   Pertinent Vitals    SLP Plan  All goals met    Recommendations Diet recommendations: Regular;Thin liquid Liquids provided via: Cup;Straw Medication Administration: Whole meds with liquid Supervision: Patient able to self feed;Staff to assist with self feeding;Full supervision/cueing for compensatory strategies              Oral Care Recommendations: Oral care BID Follow up Recommendations: Inpatient Rehab Plan: All goals met    GO    [name], MA CCC-SLP [phone]  [name] 12/17/2013, 12:00 PM

## 2013-12-17 NOTE — Progress Notes (Signed)
Inpatient Rehabilitation  Patient was screened by Dajanee Voorheis for appropriateness for an Inpatient Acute Rehab consult.  At this time, we are recommending Inpatient Rehab consult.  Please order when you feel appropriate.   Cirilo Canner PT Inpatient Rehab Admissions Coordinator Cell [phone] Office [phone]   

## 2013-12-17 NOTE — Evaluation (Signed)
Speech Language Pathology Evaluation Patient Details Name: Brian Wolf MRN: [id number] DOB: [birth year] Today's Date: 12/17/2013 Time: [id number] SLP Time Calculation (min) (ACUTE ONLY): 18 min  Problem List:  Patient Active Problem List   Diagnosis Date Noted  . Altered mental status   . Encounter for central line placement   . Encephalopathy acute   . ICH (intracerebral hemorrhage) 12/14/2013  . Fever    Past Medical History:  Past Medical History  Diagnosis Date  . Hypertension    Past Surgical History:  Past Surgical History  Procedure Laterality Date  . Abdominal surgery     HPI:  80 year old male admitted 12/14/13 due to AMS. PMH significant for HTN. CT revealed right parietal hemorragic infarct. CXR shows Cardiomegaly with mild pulmonary vascular congestion. SLE ordered to evaluate cognitive-linguistic functioning.   Assessment / Plan / Recommendation Clinical Impression  Pt demonstrates executive deficits with complex functional tasks. Problem solving, reasoning, organization and awareness are impaired impacting independence and safety with ADLs. Recommend pt f/u with acute SLP for further diagnostic assessment and increased awareness, will need f/u at next level of care, CIR if a candidate.     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)   Cognition  Overall Cognitive Status: Impaired/Different from baseline Arousal/Alertness: Awake/alert Orientation Level:  Oriented X4 Attention: Focused;Sustained;Selective;Alternating Focused Attention: Appears intact Sustained Attention: Appears intact Selective Attention: Appears intact Alternating Attention: Appears intact Memory: Appears intact Awareness: Impaired Awareness Impairment: Emergent impairment;Anticipatory impairment Problem Solving: Impaired Problem Solving Impairment: Functional complex;Verbal complex Executive Function: Organizing;Sequencing;Reasoning Reasoning: Impaired Reasoning Impairment: Verbal complex;Functional complex Sequencing: Impaired Sequencing Impairment: Functional complex Organizing: Impaired Organizing Impairment: Functional complex Behaviors: Impulsive Safety/Judgment: Impaired    Comprehension  Auditory Comprehension Overall Auditory Comprehension: Appears within functional limits for tasks assessed Reading Comprehension Reading Status: Within funtional limits    Expression Expression Primary Mode of Expression: Verbal Verbal Expression Overall Verbal Expression: Appears within functional limits for tasks assessed   Oral / Motor Oral Motor/Sensory Function Overall Oral Motor/Sensory Function: Appears within functional limits for tasks assessed Motor Speech Overall Motor Speech: Appears within functional limits for tasks assessed   GO    [name], MA CCC-SLP [phone]  [name] 12/17/2013, 12:07 PM

## 2013-12-17 NOTE — Plan of Care (Signed)
Problem: SLP Dysphagia Goals Goal: Patient will utilize recommended strategies Patient will utilize recommended strategies during swallow to increase swallowing safety with  Outcome: Completed/Met Date Met:  12/17/13

## 2013-12-17 NOTE — Evaluation (Signed)
Physical Therapy Evaluation Patient Details Name: Brian Wolf MRN: [id number] DOB: 1931/11/24 Today's Date: 12/17/2013   History of Present Illness  80 year old male admitted 12/14/13 due to AMS. PMH significant for HTN. CT revealed right parietal hemorragic infarct. CXR shows Cardiomegaly with mild pulmonary vascular congestion.  Clinical Impression  Pt admitted with rt hemorrhagic parietal CVA. Pt currently with functional limitations due to the deficits listed below (see PT Problem List). Pt highly motivated and is at high risk for fall due to visual field cut and decr balance. Pt will benefit from skilled PT to increase their independence and safety with mobility to allow discharge to the venue listed below.       Follow Up Recommendations CIR    Equipment Recommendations   (TBA)    Recommendations for Other Services Rehab consult     Precautions / Restrictions Precautions Precautions: Fall Precaution Comments: left part of left eye visual field deficit Restrictions Weight Bearing Restrictions: No      Mobility  Bed Mobility Overal bed mobility: Needs Assistance Bed Mobility: Supine to Sit     Supine to sit: Min assist     General bed mobility comments: pt very close to EOB when trying to sit up and required assist  Transfers Overall transfer level: Needs assistance Equipment used: 2 person hand held assist Transfers: Sit to/from Stand Sit to Stand: Min assist;+2 safety/equipment         General transfer comment: steady assist; pt with wide BOS  Ambulation/Gait Ambulation/Gait assistance: Mod assist;+2 physical assistance;+2 safety/equipment Ambulation Distance (Feet): 110 Feet Assistive device: 2 person hand held assist;1 person hand held assist Gait Pattern/deviations: Step-through pattern;Wide base of support;Antalgic (excessive lateral flexion Rt and Lt) Gait velocity: decr with little ability to ince   General Gait Details: pt reported  tightness in his feet (?arches) immediately upon standing and noted he has not stood x 3 day. Pt with antalgic pattern requiring 2 person assist initially with progression to 1 person  Stairs            Wheelchair Mobility    Modified Rankin (Stroke Patients Only) Modified Rankin (Stroke Patients Only) Pre-Morbid Rankin Score: No symptoms Modified Rankin: Moderately severe disability     Balance Overall balance assessment: Needs assistance         Standing balance support: Single extremity supported Standing balance-[name]: Poor                               Pertinent Vitals/Pain Pain Assessment: No/denies pain    Home Living Family/patient expects to be discharged to:: Private residence Living Arrangements: Spouse/significant other Available Help at Discharge: Family;Available 24 hours/day Type of Home: House Home Access: Stairs to enter Entrance Stairs-Rails:  (see comments # steps) Entrance Stairs-Number of Steps: 16 with rail on right (from basement garage);  3 no rail brick steps--grass area with stepping stones--steps with rail on right (from driveway). (of note son is having rails put up on both sides of all stairs with therapy giving input) Home Layout: Two level;Laundry or work area in basement (garage entrance via basement (or can use main floor entrance) Home Equipment: None      Prior Function Level of Independence: Independent         Comments: acrylic painting--hobby     Hand Dominance   Dominant Hand: Right    Extremity/Trunk Assessment   Upper Extremity Assessment: Defer to OT evaluation  Lower Extremity Assessment: Overall WFL for tasks assessed      Cervical / Trunk Assessment: Normal  Communication   Communication: No difficulties  Cognition Arousal/Alertness: Awake/alert Behavior During Therapy: WFL for tasks assessed/performed Overall Cognitive Status: Impaired/Different from baseline Area of  Impairment: Safety/judgement;Problem solving         Safety/Judgement: Decreased awareness of deficits   Problem Solving: Slow processing General Comments: requiring vc to scan to his left while moblizing    General Comments General comments (skin integrity, edema, etc.): Son arrived after session with pictures of the entrances to pt's home. Lengthy discussion with son and OT re: railing modifications needed    Exercises        Assessment/Plan    PT Assessment Patient needs continued PT services  PT Diagnosis Difficulty walking   PT Problem List Decreased activity tolerance;Decreased balance;Decreased mobility;Decreased cognition;Decreased knowledge of use of DME;Decreased safety awareness;Decreased knowledge of precautions;Pain  PT Treatment Interventions DME instruction;Gait training;Stair training;Functional mobility training;Therapeutic activities;Balance training;Neuromuscular re-education;Cognitive remediation;Patient/family education   PT Goals (Current goals can be found in the Care Plan section) Acute Rehab PT Goals Patient Stated Goal: to go home PT Goal Formulation: With patient Time For Goal Achievement: 12/24/13 Potential to Achieve Goals: Good    Frequency Min 4X/week   Barriers to discharge Decreased caregiver support wife can only provide minguard assist    Co-evaluation PT/OT/SLP Co-Evaluation/Treatment: Yes Reason for Co-Treatment: Complexity of the patient's impairments (multi-system involvement);For patient/therapist safety PT goals addressed during session: Mobility/safety with mobility;Balance         End of Session Equipment Utilized During Treatment: Gait belt Activity Tolerance: Patient tolerated treatment well Patient left: in chair;with call bell/phone within reach;with nursing/sitter in room Nurse Communication: Mobility status         Time: [id number] PT Time Calculation (min) (ACUTE ONLY): 47 min   Charges:   PT  Evaluation $Initial PT Evaluation Tier I: 1 Procedure PT Treatments $Gait Training: 8-22 mins   PT G Codes:          Aldine Chakraborty 01/03/14, 4:17 PM Pager [phone]

## 2013-12-17 NOTE — Evaluation (Addendum)
Occupational Therapy Evaluation Patient Details Name: Brian Wolf MRN: [id number] DOB: November 29, 1931 Today's Date: 12/17/2013    History of Present Illness 80 year old male admitted 12/14/13 due to AMS. PMH significant for HTN. CT revealed right parietal hemorragic infarct. CXR shows Cardiomegaly with mild pulmonary vascular congestion.   Clinical Impression   This 80 yo male admitted with above presents to acute OT with left visual field cut that he is not readily aware of, but definitely will affect his safety with all BADLS, IADLs, and mobility. I have advised him and his son that he should not drive. Despite his high level of mobility he would greatly benefit from a short inpatient rehab stay to work on visual compensation for BADLs, IADLs, and mobility to get to a Mod I level in new and familiar environments.    Follow Up Recommendations  CIR    Equipment Recommendations  Tub/shower seat       Precautions / Restrictions Precautions Precautions: Fall Precaution Comments: left part of left eye visual field deficit Restrictions Weight Bearing Restrictions: No      Mobility Bed Mobility Overal bed mobility: Needs Assistance Bed Mobility: Supine to Sit     Supine to sit: Min assist        Transfers Overall transfer level: Needs assistance Equipment used: 2 person hand held assist Transfers: Sit to/from Stand Sit to Stand: Min assist                   ADL Overall ADL's : Needs assistance/impaired Eating/Feeding: Supervision/ safety;Cueing for compensatory techinques (to look left to find all items on his tray)   Grooming: Supervision/safety;Set up;Cueing for compensatory techniques (to look left)   Upper Body Bathing: Supervision/ safety;Set up (to look left)   Lower Body Bathing: Set up;Supervison/ safety;Sit to/from stand (to look left)   Upper Body Dressing : Set up;Supervision/safety (to look left)   Lower Body Dressing: Set  up;Supervision/safety;Sit to/from stand (to look left)   Toilet Transfer: Minimal assistance   Toileting- Clothing Manipulation and Hygiene: Supervision/safety;Sit to/from stand   Had a 30 minute discussion with son on steps in his father's home and what kind and how to put up additional hand rails. Also during this discussion was safety in the bathroom.               Vision Eye Alignment: Within Functional Limits Alignment/Gaze Preference: Within Defined Limits Ocular Range of Motion: Within Functional Limits Tracking/Visual Pursuits: Able to track stimulus in all quads without difficulty Saccades: Additional eye shifts occurred during testing Convergence: Within functional limits     Additional Comments: In attempting to read a paragraph, pt really struggled--rotated book left and right and finally said he doesn't reach much          Pertinent Vitals/Pain Pain Assessment: No/denies pain     Hand Dominance Right   Extremity/Trunk Assessment Upper Extremity Assessment Upper Extremity Assessment: Overall WFL for tasks assessed   Lower Extremity Assessment Lower Extremity Assessment: Defer to PT evaluation       Communication Communication Communication: No difficulties   Cognition Arousal/Alertness: Awake/alert Behavior During Therapy: WFL for tasks assessed/performed Overall Cognitive Status: Impaired/Different from baseline Area of Impairment: Safety/judgement;Problem solving         Safety/Judgement: Decreased awareness of deficits (visual field cut)   Problem Solving: Slow processing General Comments: When had pt stop and find the number of people in the nurses station he needed VCs to look left.  When had  in stop in front of two room numbers and locate all of one color on the room plates on first attempt he needed max cues to look left and then with each of the two subsequent colors he needed less cue to look left.              Home Living  Family/patient expects to be discharged to:: Private residence Living Arrangements: Spouse/significant other Available Help at Discharge: Family;Available 24 hours/day Type of Home: House Home Access: Stairs to enter [employer] of Steps: 16 with rail on right (from basement garage);  3 no rail brick steps--grass area with stepping stones--steps with rail on right (from driveway). [employer] note son is having rails put up on both sides of all stairs with therapy giving input]   Home Layout: Two level;Bed/bath upstairs Alternate Level Stairs-Number of Steps: See entrance stairs above   Bathroom Shower/Tub: Tub/shower unit;Curtain Shower/tub characteristics: Architectural technologist: Standard     Home Equipment: None      Lives With: Spouse    Prior Functioning/Environment Level of Independence: Independent             OT Diagnosis: Generalized weakness;Cognitive deficits;Disturbance of vision   OT Problem List: Impaired balance (sitting and/or standing);Impaired vision/perception;Decreased safety awareness;Decreased cognition   OT Treatment/Interventions: Self-care/ADL training;Therapeutic exercise;DME and/or AE instruction;Therapeutic activities;Visual/perceptual remediation/compensation;Cognitive remediation/compensation;Patient/family education;Balance training    OT Goals(Current goals can be found in the care plan section) Acute Rehab OT Goals Patient Stated Goal: to go home OT Goal Formulation: With patient/family Time For Goal Achievement: 12/24/13 Potential to Achieve Goals: Good  OT Frequency: Min 2X/week (3 if at all possible)           Co-evaluation PT/OT/SLP Co-Evaluation/Treatment:  (partial) Reason for Co-Treatment: For patient/therapist safety;Complexity of the patient's impairments (multi-system involvement)          End of Session Equipment Utilized During Treatment: Gait belt Nurse Communication:  (nurse saw [name] walking with pt)  Activity  Tolerance: Patient tolerated treatment well Patient left: in chair;with call bell/phone within reach;with nursing/sitter in room   Time: 1336-1444 OT Time Calculation (min): 68 min Charges:  OT General Charges $OT Visit: 1 Procedure OT Evaluation $Initial OT Evaluation Tier I: 1 Procedure OT Treatments $Self Care/Home Management : 23-37 mins  Almon Register [id number] 12/17/2013, 3:58 PM

## 2013-12-17 NOTE — Progress Notes (Signed)
STROKE TEAM PROGRESS NOTE   HISTORY Brian Wolf is an 80 y.o. male who was noted yesterday to by wife that he was acting strange. He was complaining of a right temporal pain when he coughed and seemed off balance. She noted he went out to the drive way and seemed to be walking to the right and then was not talking sensibly. Wife also noted some weakness in his left hand. This AM she brought him to his PCP who directed them to the ED. On arrival to ED initial CT scan showed a large right acute hemorraghic infarct. Currently patient is complaining of a slight pain on the right temple, and that he is thirsty. He will answer some questions but desires to not take part exam.   Date last known well: Date: 12/13/2013 Time last known well: Unable to determine tPA Given: No: ICH   SUBJECTIVE (INTERVAL HISTORY) His son and wife are at the bedside.  Overall he feels his condition is much improved. Awake alert and conversing well. repeat CT 12/16/13 showed no significant change. He had fever 12/16/13 and urine cloudy. UA and pan suggest enterococcus. BP controlled. Now off cardene  OBJECTIVE Temp:  [97.5 F (36.4 C)-99.5 F (37.5 C)] 99.1 F (37.3 C) (11/16 0800) Pulse Rate:  [64-80] 78 (11/16 0930) Cardiac Rhythm:  [-] Normal sinus rhythm (11/16 0745) Resp:  [13-31] 25 (11/16 0930) BP: (106-192)/(40-154) 192/73 mmHg (11/16 1006) SpO2:  [94 %-100 %] 99 % (11/16 0930) FiO2 (%):  [31 %] 31 % (11/16 0026) Weight:  [170 lb 6.7 oz (77.3 kg)] 170 lb 6.7 oz (77.3 kg) (11/15 1600)   Recent Labs Lab 12/16/13 0831 12/16/13 1229 12/16/13 1712 12/16/13 2350 12/17/13 0813  GLUCAP 120* 130* 112* 127* 124*    Recent Labs Lab 12/14/13 1137 12/14/13 1550 12/15/13 0342  12/16/13 0400 12/16/13 0920 12/16/13 1820 12/16/13 2345 12/17/13 0545  NA 138 137 137  < > 139 139 138 140 145  K 4.2 4.1 3.9  --  3.4*  --   --   --  3.0*  CL 98 98 97  --  103  --   --   --  109  CO2 26 23 23   --  25   --   --   --  25  GLUCOSE 129* 120* 133*  --  125*  --   --   --  113*  BUN 26* 26* 25*  --  21  --   --   --  16  CREATININE 0.92 0.88 1.02  --  0.96  --   --   --  0.86  CALCIUM 9.3 8.9 8.6  --  7.4*  --   --   --  7.1*  < > = values in this interval not displayed.  Recent Labs Lab 12/14/13 1137 12/14/13 1550  AST 24 26  ALT 18 18  ALKPHOS 46 45  BILITOT 0.7 0.7  PROT 8.2 7.8  ALBUMIN 3.7 3.5    Recent Labs Lab 12/14/13 1137 12/14/13 1550 12/15/13 0342 12/16/13 0400 12/17/13 0545  WBC 13.5* 16.0* 13.7* 12.8* 12.9*  NEUTROABS 9.9* 11.2*  --   --   --   HGB 13.5 13.3 12.8* 10.5* 10.1*  HCT 40.8 39.7 39.5 31.4* 31.2*  MCV 91.3 91.1 93.6 93.2 93.1  PLT 279 PLATELETS APPEAR ADEQUATE 239 194 201   No results for input(s): CKTOTAL, CKMB, CKMBINDEX, TROPONINI in the last 168 hours.  Recent Labs  12/14/13  1340 12/14/13 2100  LABPROT 13.7 13.7  INR 1.03 1.04    Recent Labs  12/14/13 1356 12/16/13 0929  COLORURINE YELLOW YELLOW  LABSPEC 1.021 1.017  PHURINE 6.5 5.5  GLUCOSEU NEGATIVE NEGATIVE  HGBUR TRACE* MODERATE*  BILIRUBINUR NEGATIVE NEGATIVE  KETONESUR NEGATIVE NEGATIVE  PROTEINUR 100* 30*  UROBILINOGEN 0.2 0.2  NITRITE NEGATIVE POSITIVE*  LEUKOCYTESUR NEGATIVE MODERATE*       Component Value Date/Time   CHOL 147 12/15/2013 0342   TRIG 84 12/15/2013 0342   HDL 54 12/15/2013 0342   CHOLHDL 2.7 12/15/2013 0342   VLDL 17 12/15/2013 0342   LDLCALC 76 12/15/2013 0342   Lab Results  Component Value Date   HGBA1C 6.4* 12/15/2013   No results found for: LABOPIA, COCAINSCRNUR, LABBENZ, AMPHETMU, THCU, LABBARB  No results for input(s): ETH in the last 168 hours.  I have personally reviewed the radiological images below and agree with the radiology interpretations.  Dg Chest 2 View  12/14/2013   IMPRESSION: Enlargement of cardiac silhouette.  Minimal bronchitic changes and LEFT basilar atelectasis.     Ct Head Wo Contrast  12/15/13 - Evolving,  relatively stable RIGHT parietal intraparenchymal hematoma with resultant 3 mm RIGHT to LEFT midline shift. Mild RIGHT lateral ventricle effacement without entrapment or hydrocephalus. Moderate to severe white matter changes suggest chronic small vessel ischemic disease.  12/15/2013   IMPRESSION: Evolving RIGHT parietal intraparenchymal hematoma with resultant 4 mm RIGHT to LEFT midline shift. No ventricular entrapment/ hydrocephalus.  Severe white matter changes likely reflect chronic small vessel ischemic disease.      12/14/2013   IMPRESSION: Large acute hemorrhagic infarct in the posterior RIGHT parietal lobe, area of hemorrhage 5.3 x 3.3 cm with surrounding vasogenic edema and minimal RIGHT LEFT midline shift.  Underlying atrophy and small vessel chronic ischemic changes of deep cerebral white matter.     2D echo - - Left ventricle: The cavity size was normal. Wall thickness was normal. Systolic function was normal. The estimated ejection fraction was in the range of 55% to 60%. Wall motion was normal; there were no regional wall motion abnormalities. Doppler parameters are consistent with abnormal left ventricular relaxation (grade 1 diastolic dysfunction). - Aortic valve: There was mild regurgitation. - Left atrium: The atrium was moderately dilated. - Pulmonary arteries: Systolic pressure was mildly to moderately increased. PA peak pressure: 40 mm Hg (S).  Urine c/s : enterococcus sensitive to levaquin  PHYSICAL EXAM  Temp:  [97.5 F (36.4 C)-99.5 F (37.5 C)] 99.1 F (37.3 C) (11/16 0800) Pulse Rate:  [64-80] 78 (11/16 0930) Resp:  [13-31] 25 (11/16 0930) BP: (106-192)/(40-154) 192/73 mmHg (11/16 1006) SpO2:  [94 %-100 %] 99 % (11/16 0930) FiO2 (%):  [31 %] 31 % (11/16 0026) Weight:  [170 lb 6.7 oz (77.3 kg)] 170 lb 6.7 oz (77.3 kg) (11/15 1600)  General - Well nourished, well developed [place] male,  Ophthalmologic - not able to see through.  Cardiovascular -  Regular rate and rhythm with no murmur.  Mental Status -  Level of arousal and orientation to time, place, and person were intact. Language including expression, naming, repetition, comprehension was assessed and found intact.  Cranial Nerves II - XII - II - left visual field deficit/neglect. III, IV, VI - Extraocular movements intact. V - Facial sensation intact bilaterally. VII - Facial movement intact bilaterally. VIII - Hearing & vestibular intact bilaterally. X - Palate elevates symmetrically. XI - [place] turning & shoulder shrug intact bilaterally. XII - Tongue  protrusion intact.  Motor Strength - The patient's strength was normal in all extremities .no drift was present.  Bulk was normal and fasciculations were absent.   Motor Tone - Muscle tone was assessed at the neck and appendages and was normal.  Reflexes - The patient's reflexes were normal in all extremities and he had no pathological reflexes.  Sensory - Light touch, temperature/pinprick were assessed and were normal.    Coordination - The patient had normal movements in the hands with no ataxia or dysmetria.  Tremor was absent.  Gait and Station - not tested due to labile BP.   ASSESSMENT/PLAN Mr. Seamus Ivie is a 79 y.o. male with history of HTN presenting with left side weakness, confusion and off balance with HA. He did not receive IV t-PA due to [place].    ICH:  Non-dominant right parietofrontal hemorrhage ~ 40cc, etiology unclear but likely due to HTN vs. CAA  CT showed left large frontoparietal bleeding  Repeat CT showed stable hematoma  MRI and MRA to be ordered to rule out tumor, AVM and CAA  2D Echo  unremarkable  LDL 76, not at goal < 70  HgbA1c 6.4, at the goal < 6.5  SCDs for VTE prophylaxis  DIET DYS 2   aspirin 81 mg orally every day prior to admission, now on no antithrombotics  Ongoing aggressive risk factor management  Therapy recommendations:  pending  Disposition:   Pending  Cerebral edema - repeat CT showed increased surrounding edema - discussed with family, agree with hypertonic saline - on 3% saline at 75cc/h - Na goal 150-160, today 145 - if pt mental status getting worse or edema with more midline shift, may consider surgical evacuation. Family agree with surgical intervention if needed. Dr. Christella Noa from [facility] notified.  Fever  - Tmax 101.9 - cloudy urine, UA and culture grew enterococcus - change cipro to Levaquin per pharmacy - blood culture sent out also  Hypertension  Home meds:   HCTZ, losartan and metoprolo, resume in hosptial  Unstable  BP goal < 160  Ordered cardene drip, currently off cardene drip  Hyperlipidemia  Home meds:  none  LDL 76, not at goal < 70  Hold off statin now due to [place]  Other Stroke Risk Factors  Advanced age Resume home medicines. Mobilize out of bed, taper and Dc 3% saline. Transfer to stroke unit floor later today Hospital day # 3  This patient is critically ill due to large right ICH, fever and cerebral edema and at significant risk of neurological worsening, death form recurrent bleeding, cerebral edema and brain herniation. This patient's care requires constant monitoring of vital signs, hemodynamics, respiratory and cardiac monitoring, review of multiple databases, neurological assessment, discussion with family, other specialists and medical decision making of high complexity. I had long discussion with pt son regarding the clinical decision, treatment plan and prognosis. I spent 30 minutes of neurocritical care time in the care of this patient.  Antony Contras, MD Stroke Neurology 12/17/2013 10:16 AM      To contact Stroke Continuity provider, please refer to [email or web address] After hours, contact General Neurology

## 2013-12-17 NOTE — Plan of Care (Signed)
Problem: Acute Treatment Outcomes Goal: Neuro exam at baseline or improved Outcome: Progressing     

## 2013-12-17 NOTE — Clinical Social Work Note (Signed)
Clinical Social Worker provided patient son with requested paperwork/documentation for patient regarding upcoming trip to [place].  Patient son states that they will be reaching out to the airlines and hotel in hopes of a possible refund.  Patient family prepared to assist patient as needed once medically cleared at discharge.  CSW available if further needs arise.  Barbette Or, [place]

## 2013-12-17 NOTE — Plan of Care (Signed)
Problem: Acute Treatment Outcomes Goal: Pain controlled Outcome: Completed/Met Date Met:  12/17/13

## 2013-12-17 NOTE — Plan of Care (Signed)
Problem: Acute Treatment Outcomes Goal: Neuro exam at baseline or improved Outcome: Completed/Met Date Met:  12/17/13 Goal: Airway maintained/protected Outcome: Completed/Met Date Met:  12/17/13 Goal: Prognosis discussed with family/patient as appropriate Outcome: Completed/Met Date Met:  12/17/13  Problem: Progression Outcomes Goal: If vent dependent, tolerates weaning Outcome: Not Applicable Date Met:  02/02/70 Goal: Bowel & Bladder Continence Outcome: Completed/Met Date Met:  12/17/13

## 2013-12-17 NOTE — Plan of Care (Signed)
Problem: SLP Cognition Goals Goal: Patient will demonstrate problem solving skills Patient will demonstrate problem solving skills during functional ADL's with Outcome: Progressing Goal: Patient will demonstrate awareness during Patient will demonstrate awareness during functional ADL for improved safety Outcome: Progressing

## 2013-12-17 NOTE — Plan of Care (Signed)
Problem: Acute Treatment Outcomes Goal: Nausea and vomiting controlled Outcome: Completed/Met Date Met:  12/17/13

## 2013-12-18 DIAGNOSIS — R414 Neurologic neglect syndrome: Secondary | ICD-10-CM

## 2013-12-18 DIAGNOSIS — I612 Nontraumatic intracerebral hemorrhage in hemisphere, unspecified: Secondary | ICD-10-CM

## 2013-12-18 DIAGNOSIS — I619 Nontraumatic intracerebral hemorrhage, unspecified: Secondary | ICD-10-CM

## 2013-12-18 LAB — GLUCOSE, CAPILLARY
GLUCOSE-CAPILLARY: 139 mg/dL — AB (ref 70–99)
Glucose-Capillary: 122 mg/dL — ABNORMAL HIGH (ref 70–99)
Glucose-Capillary: 118 mg/dL — ABNORMAL HIGH (ref 70–99)

## 2013-12-18 LAB — URINE CULTURE: Colony Count: >=100000

## 2013-12-18 LAB — SODIUM
Sodium: 137 mEq/L (ref 137–147)
Sodium: 137 mEq/L (ref 137–147)

## 2013-12-18 MED ORDER — HYDROCHLOROTHIAZIDE 25 MG PO TABS
37.5000 mg | ORAL_TABLET | Freq: Every day | ORAL | Status: DC
  Administered 2013-12-18 – 2013-12-20 (×3): 37.5 mg via ORAL
  Filled 2013-12-18: qty 1.5
  Filled 2013-12-18 (×2): qty 2
  Filled 2013-12-18: qty 1.5

## 2013-12-18 NOTE — Progress Notes (Signed)
Physical Therapy Treatment Patient Details Name: Brian Wolf MRN: [id number] DOB: [birth year] Today's Date: 12/18/2013    History of Present Illness 80 year old male admitted 12/14/13 due to AMS. PMH significant for HTN. CT revealed right parietal hemorragic infarct. CXR shows Cardiomegaly with mild pulmonary vascular congestion.    PT Comments    Pt needed encouragement for OOB this morning 2/2 c/o pain in toes on Bil feet.  Family to be bringing in his shoes today to see if this will help alleviate toe discomfort in standing.  Feel pt would benefit from CIR at D/C to maximize independence and safety prior to returning to home.  Will continue to follow.    Follow Up Recommendations  CIR     Equipment Recommendations   (TBD)    Recommendations for Other Services Rehab consult     Precautions / Restrictions Precautions Precautions: Fall Precaution Comments: left part of left eye visual field deficit Restrictions Weight Bearing Restrictions: No    Mobility  Bed Mobility Overal bed mobility: Needs Assistance Bed Mobility: Supine to Sit     Supine to sit: Min assist     General bed mobility comments: cues for encouragement and not gettign too close to the EOB.    Transfers Overall transfer level: Needs assistance Equipment used: 2 person hand held assist Transfers: Sit to/from [facility] Sit to Stand: Min assist;+2 physical assistance Stand pivot transfers: Mod assist;+2 physical assistance       General transfer comment: pt maintains WOB and needs cueing for not sitting prematurely.  pt indicates pain in toes on Bil feet limiting mobility today.    Ambulation/Gait                 Stairs            Wheelchair Mobility    Modified Rankin (Stroke Patients Only) Modified Rankin (Stroke Patients Only) Pre-Morbid Rankin Score: No symptoms Modified Rankin: Moderately severe disability     Balance Overall balance assessment:  Needs assistance         Standing balance support: Single extremity supported;During functional activity Standing balance-[name]: Poor                      Cognition Arousal/Alertness: Awake/alert Behavior During Therapy: WFL for tasks assessed/performed Overall Cognitive Status: Impaired/Different from baseline Area of Impairment: Safety/judgement;Problem solving         Safety/Judgement: Decreased awareness of deficits;Decreased awareness of safety   Problem Solving: Slow processing General Comments: pt needs cues to scan L side during mobility.      Exercises      General Comments        Pertinent Vitals/Pain Pain Assessment: No/denies pain    Home Living                      Prior Function            PT Goals (current goals can now be found in the care plan section) Acute Rehab PT Goals Patient Stated Goal: to go home PT Goal Formulation: With patient Time For Goal Achievement: 12/24/13 Potential to Achieve Goals: Good Progress towards PT goals: Not progressing toward goals - comment (Toe pain this morning)    Frequency  Min 4X/week    PT Plan Current plan remains appropriate    Co-evaluation             End of Session Equipment Utilized During Treatment: Gait  belt Activity Tolerance: Patient tolerated treatment well Patient left: in chair;with call bell/phone within reach;with nursing/sitter in room     Time: 0834-0902 PT Time Calculation (min) (ACUTE ONLY): 28 min  Charges:  $Therapeutic Activity: 23-37 mins                    G CodesCatarina Hartshorn, [place] 12/18/2013, 9:34 AM

## 2013-12-18 NOTE — Progress Notes (Signed)
Occupational Therapy Treatment Patient Details Name: Brian Wolf MRN: [id number] DOB: 06/29/1931 Today's Date: 12/18/2013    History of present illness 80 year old male admitted 12/14/13 due to AMS. PMH significant for HTN. CT revealed right parietal hemorragic infarct. CXR shows Cardiomegaly with mild pulmonary vascular congestion.   OT comments  Pt limited this pm by distraction and fatigue.  He did track to left to make eye contact and converse with staff on Lt.  Requires min verbal cues to locate coffee in his left lower quadrant.  He continues to have decreased awareness of deficits, and will greatly benefit from the structure and consistency of CIR.   Follow Up Recommendations  CIR    Equipment Recommendations  Tub/shower seat    Recommendations for Other Services Rehab consult    Precautions / Restrictions Precautions Precautions: Fall Precaution Comments: L field deficit       Mobility Bed Mobility   Bed Mobility: Rolling Rolling: Min assist            Transfers                      Balance                                   ADL   Eating/Feeding: Supervision/ safety                                     General ADL Comments: Attempted to move pt to EOB; however, mulitple staff members in during session.  Pt difficult to keep on task, and ultimately deferred EOB activity citing fatigue this afternoon.       Vision                 Additional Comments: Pt looked to therapist and staff member on his Lt and made eye contact.  he requires min verbal cues to correctly locate coffee cup in his Left inferior quadrant    Perception     Praxis      Cognition   Behavior During Therapy: Anxious Overall Cognitive Status: Impaired/Different from baseline Area of Impairment: Attention;Awareness   Current Attention Level: Selective      Safety/Judgement: Decreased awareness of safety;Decreased awareness of  deficits   Problem Solving: Requires verbal cues General Comments: Pt able to provide detailed info to OT and staff re: events of the day - sitter confirms this information is correct.  Pt very easily distracted and self distracts.  requires mod verbal cues to maintain selective attention     Extremity/Trunk Assessment               Exercises     Shoulder Instructions       General Comments      Pertinent Vitals/ Pain       Pain Assessment: Faces Faces Pain Scale: Hurts a little bit Pain Location: Rt great toe  Pain Descriptors / Indicators: Grimacing Pain Intervention(s): Monitored during session  Home Living                                          Prior Functioning/Environment              Frequency Min 2X/week  Progress Toward Goals  OT Goals(current goals can now be found in the care plan section)  Progress towards OT goals: Progressing toward goals  Acute Rehab OT Goals Patient Stated Goal: to go home OT Goal Formulation: With patient/family Time For Goal Achievement: 12/24/13 Potential to Achieve Goals: Good ADL Goals Pt Will Perform Eating: with modified independence;sitting Pt Will Perform Grooming: with supervision;standing Pt Will Perform Upper Body Bathing: with supervision;sitting;standing Pt Will Perform Lower Body Bathing: with supervision;sit to/from stand Additional ADL Goal #1: Pt will be able to locate items in his left visual field in new environments without VCs  Plan Discharge plan remains appropriate    Co-evaluation                 End of Session     Activity Tolerance Patient limited by fatigue   Patient Left in chair;with call bell/phone within reach;with nursing/sitter in room   Nurse Communication Mobility status;Other (comment) (request for food)        Time: 1451-1510 OT Time Calculation (min): 19 min  Charges: OT General Charges $OT Visit: 1 Procedure OT Treatments $Therapeutic  Activity: 8-22 mins  Donnabelle Blanchard M 12/18/2013, 3:21 PM

## 2013-12-18 NOTE — Consult Note (Signed)
Physical Medicine and Rehabilitation Consult  Reason for Consult:  Problems walking, cognitive deficits as well as visual deficits.  Referring Physician: Dr. Leonie Man.    HPI: Brian Wolf is a 80 y.o. LH- male who was noted on 12/13/13 by wife that he was acting strange. He was complaining of a right temporal pain when he coughed and seemed off balance. She noted he went out to the drive way and seemed to be walking to the right and then was not talking sensibly. Wife also noted some weakness in his left hand. On 11/13 he was sent from PCP office to ED for evaluation.  On arrival to ED initial CT scan showed a large right acute hemorraghic infarct. 2D echo with EF 55-60% with no regional wall abnormality.  Patient with lethargy as well as apneic episodes on 11/14 and repeat CT head was with evolving right parietal IPH with mild shift and mild right lateral ventricle effacement and was started on hypertonic saline for edema. Patient with fever due to enterobacter UTI and was started on cipro for treatment.  MRI/MRA  brain with Large RIGHT temporal occipital lobar hemorrhage with surrounding edema and subcentimeter of acute infarct right lateral temporal lobe. Mentation has improved and patient taken off bedrest 11/16 with initiation of therapy. He was started on regular diet and cognitive evaluation with  executive deficits with complex functional tasks. Patient with resultant ataxic gait, left inattention due to visual field deficits as well as intermittent confusion with poor safety awareness. CIR recommended by MD and rehab team.    Review of Systems  Unable to perform ROS: other  HENT: Negative for hearing loss.   Respiratory: Negative for cough and shortness of breath.   Cardiovascular: Negative for chest pain.  Musculoskeletal: Negative for myalgias, back pain and joint pain.  Neurological: Negative for dizziness and headaches.   Past Medical History  Diagnosis Date  .  Hypertension    Past Surgical History  Procedure Laterality Date  . Abdominal surgery     Family History  Problem Relation Age of Onset  . Hyperlipidemia Mother   . Hypertension Mother   . Hypertension Father    Social History:  Married. Works as an Training and development officer. He reports that he has never smoked. He has never used smokeless tobacco. He reports that he does not drink alcohol. His drug history is not on file.   Allergies: No Known Allergies   Medications Prior to Admission  Medication Sig Dispense Refill  . aspirin EC 81 MG tablet Take 81 mg by mouth daily.    . hydrochlorothiazide (HYDRODIURIL) 25 MG tablet Take 25 mg by mouth daily.    Marland Kitchen losartan (COZAAR) 100 MG tablet Take 100 mg by mouth daily.    . metoprolol (LOPRESSOR) 50 MG tablet Take 50 mg by mouth 2 (two) times daily.    . simvastatin (ZOCOR) 40 MG tablet Take 40 mg by mouth daily.    . Travoprost, BAK Free, (TRAVATAN) 0.004 % SOLN ophthalmic solution Place 1 drop into both eyes at bedtime.    . chlorpheniramine-HYDROcodone (TUSSIONEX PENNKINETIC ER) 10-8 MG/5ML LQCR Take 5 mLs by mouth every 12 (twelve) hours as needed for cough. (Patient not taking: Reported on 12/14/2013) 115 mL 1    Home: Home Living Family/patient expects to be discharged to:: Private residence Living Arrangements: Spouse/significant other Available Help at Discharge: Family, Available 24 hours/day Type of Home: House Home Access: Stairs to enter [employer] of Steps: 16  with rail on right (from basement garage);  3 no rail brick steps--grass area with stepping stones--steps with rail on right (from driveway). of note son is having rails put up on both sides of all stairs with therapy giving input Entrance Stairs-Rails:  (see comments # steps) Home Layout: Two level, Laundry or work area in basement (garage entrance via basement (or can use main floor entrance) Alternate Level Stairs-Number of Steps: See entrance stairs above Home  Equipment: None  Lives With: Spouse  Functional History: Prior Function Level of Independence: Independent Comments: acrylic painting--hobby Functional Status:  Mobility: Bed Mobility Overal bed mobility: Needs Assistance Bed Mobility: Supine to Sit Supine to sit: Min assist General bed mobility comments: cues for encouragement and not gettign too close to the EOB.   Transfers Overall transfer level: Needs assistance Equipment used: 2 person hand held assist Transfers: Sit to/from Stand, Stand Pivot Transfers Sit to Stand: Min assist, +2 physical assistance Stand pivot transfers: Mod assist, +2 physical assistance General transfer comment: pt maintains WOB and needs cueing for not sitting prematurely.  pt indicates pain in toes on Bil feet limiting mobility today.   Ambulation/Gait Ambulation/Gait assistance: Mod assist, +2 physical assistance, +2 safety/equipment Ambulation Distance (Feet): 110 Feet Assistive device: 2 person hand held assist, 1 person hand held assist Gait Pattern/deviations: Step-through pattern, Wide base of support, Antalgic (excessive lateral flexion Rt and Lt) Gait velocity: decr with little ability to ince General Gait Details: pt reported tightness in his feet (?arches) immediately upon standing and noted he has not stood x 3 day. Pt with antalgic pattern requiring 2 person assist initially with progression to 1 person    ADL: ADL Overall ADL's : Needs assistance/impaired Eating/Feeding: Supervision/ safety, Cueing for compensatory techinques (to look left to find all items on his tray) Grooming: Supervision/safety, Set up, Cueing for compensatory techniques (to look left) Upper Body Bathing: Supervision/ safety, Set up (to look left) Lower Body Bathing: Set up, Supervison/ safety, Sit to/from stand (to look left) Upper Body Dressing : Set up, Supervision/safety (to look left) Lower Body Dressing: Set up, Supervision/safety, Sit to/from stand (to look  left) Toilet Transfer: Minimal assistance Toileting- Clothing Manipulation and Hygiene: Supervision/safety, Sit to/from stand  Cognition: Cognition Overall Cognitive Status: Impaired/Different from baseline Arousal/Alertness: Awake/alert Orientation Level: Oriented to person, Oriented to time, Oriented to place, Disoriented to situation Attention: Focused, Sustained, Selective, Alternating Focused Attention: Appears intact Sustained Attention: Appears intact Selective Attention: Appears intact Alternating Attention: Appears intact Memory: Appears intact Awareness: Impaired Awareness Impairment: Emergent impairment, Anticipatory impairment Problem Solving: Impaired Problem Solving Impairment: Functional complex, Verbal complex Executive Function: Organizing, Sequencing, Reasoning Reasoning: Impaired Reasoning Impairment: Verbal complex, Functional complex Sequencing: Impaired Sequencing Impairment: Functional complex Organizing: Impaired Organizing Impairment: Functional complex Behaviors: Impulsive Safety/Judgment: Impaired Cognition Arousal/Alertness: Awake/alert Behavior During Therapy: Restless Overall Cognitive Status: Impaired/Different from baseline Area of Impairment: Attention Current Attention Level: Sustained (internally distracted) Safety/Judgement: Decreased awareness of safety, Decreased awareness of deficits Problem Solving: Slow processing General Comments: difficult to assess due to cooperation this am. Pt perseverating on wanting to eat  Blood pressure 163/73, pulse 85, temperature 98.9 F (37.2 C), temperature source Oral, resp. rate 26, height 5\' 5"  (1.651 m), weight 77.3 kg (170 lb 6.7 oz), SpO2 98 %. Physical Exam  Nursing note and vitals reviewed. Constitutional: He appears well-developed and well-nourished.  HENT:  Head: Normocephalic and atraumatic.  Eyes: Conjunctivae are normal. Pupils are equal, round, and reactive to light.  Neck: Normal range  of motion. Neck supple.  Cardiovascular: Normal rate and regular rhythm.  Exam reveals no gallop.   Respiratory: Effort normal and breath sounds normal. No respiratory distress. He has no wheezes.  GI: Soft. Bowel sounds are normal.  Musculoskeletal: He exhibits no edema.  Erythema with tenderness right forefoot/right great toe.   Neurological: He is alert.  Oriented to self, place, date, year. Poor frustration tolerance requiring coaxing to participate in exam. Perseverative behaviors needing redirection. Speech clear. Moves all four. Able to follow simple one step commands and needs cues for 2 step directions. Has poor insight and lacks awareness of deficits. Mild left inattention, processing delays. Strength nearly 4+/5 LUE and LLE. Mild decrease in [facility] on left. Senses pain,LT on left side. No gross visual cuts   Skin: Skin is warm and dry.  Psychiatric: He has a normal mood and affect. His behavior is normal.    Results for orders placed or performed during the hospital encounter of 12/14/13 (from the past 24 hour(s))  Glucose, capillary     Status: Abnormal   Collection Time: 12/17/13  5:02 PM  Result Value Ref Range   Glucose-Capillary 123 (H) 70 - 99 mg/dL  Sodium     Status: None   Collection Time: 12/17/13  5:45 PM  Result Value Ref Range   Sodium 140 137 - 147 mEq/L  Glucose, capillary     Status: Abnormal   Collection Time: 12/17/13  8:13 PM  Result Value Ref Range   Glucose-Capillary 140 (H) 70 - 99 mg/dL  Sodium     Status: None   Collection Time: 12/18/13 12:00 AM  Result Value Ref Range   Sodium 137 137 - 147 mEq/L  Glucose, capillary     Status: Abnormal   Collection Time: 12/18/13  1:09 AM  Result Value Ref Range   Glucose-Capillary 122 (H) 70 - 99 mg/dL  Sodium     Status: None   Collection Time: 12/18/13  6:35 AM  Result Value Ref Range   Sodium 137 137 - 147 mEq/L  Glucose, capillary     Status: Abnormal   Collection Time: 12/18/13  8:35 AM  Result Value  Ref Range   Glucose-Capillary 139 (H) 70 - 99 mg/dL   Comment 1 Notify RN    Comment 2 Documented in Chart   Glucose, capillary     Status: Abnormal   Collection Time: 12/18/13 11:55 AM  Result Value Ref Range   Glucose-Capillary 118 (H) 70 - 99 mg/dL   Comment 1 Notify RN    Comment 2 Documented in Chart    Mr Virgel Paling Wo Contrast  12/17/2013   CLINICAL DATA:  Hypertension, with a large lobar hemorrhage of uncertain etiology. LEFT hemiparesis.  EXAM: MRI HEAD WITHOUT CONTRAST  MRA HEAD WITHOUT CONTRAST  TECHNIQUE: Multiplanar, multiecho pulse sequences of the brain and surrounding structures were obtained without intravenous contrast. Angiographic images of the head were obtained using MRA technique without contrast.  COMPARISON:  None.  FINDINGS: MRI HEAD FINDINGS  There is an acute RIGHT temporo-occipital lobar hemorrhage with surrounding edema. This measures 29 x 48 x 34 mm corresponding to an approximate volume of 21 mL. Probable trace subarachnoid and intraventricular blood. Tiny focus of restricted diffusion RIGHT lateral temporal lobe as seen on image 14 series 4 could represent a small acute infarct. No obvious associated mass to suggest a hemorrhagic metastasis. Slight midline shift of the septum pellucidum right-to-left of 3 mm, stable. Generalized atrophy. Moderate T2 and  FLAIR hyperintensities suggesting chronic microvascular ischemic change. No midline abnormality. Extracranial soft tissues grossly unremarkable.  MRA HEAD FINDINGS  The internal carotid arteries are widely patent. The basilar artery is widely patent with vertebrals both contributing. No proximal MCA or PCA stenosis. No intracranial aneurysm. Azygos LEFT A1 ACA. No cerebellar branch occlusion.  IMPRESSION: Large RIGHT temporal occipital lobar hemorrhage with surrounding edema. Approximate volume 21 mL. Probable trace subarachnoid and intraventricular blood. Probable lobar hypertensive bleed. Cerebral amyloid angiopathy not  excluded. No obvious underlying mass or vascular malformation.  Subcentimeter focus of acute infarction RIGHT lateral temporal lobe, remote from the hemorrhage.  No intracranial flow limiting stenosis or vascular malformation.   Electronically Signed   By: Rolla Flatten M.D.   On: 12/17/2013 16:59   Mr Brain Wo Contrast  12/17/2013   CLINICAL DATA:  Hypertension, with a large lobar hemorrhage of uncertain etiology. LEFT hemiparesis.  EXAM: MRI HEAD WITHOUT CONTRAST  MRA HEAD WITHOUT CONTRAST  TECHNIQUE: Multiplanar, multiecho pulse sequences of the brain and surrounding structures were obtained without intravenous contrast. Angiographic images of the head were obtained using MRA technique without contrast.  COMPARISON:  None.  FINDINGS: MRI HEAD FINDINGS  There is an acute RIGHT temporo-occipital lobar hemorrhage with surrounding edema. This measures 29 x 48 x 34 mm corresponding to an approximate volume of 21 mL. Probable trace subarachnoid and intraventricular blood. Tiny focus of restricted diffusion RIGHT lateral temporal lobe as seen on image 14 series 4 could represent a small acute infarct. No obvious associated mass to suggest a hemorrhagic metastasis. Slight midline shift of the septum pellucidum right-to-left of 3 mm, stable. Generalized atrophy. Moderate T2 and FLAIR hyperintensities suggesting chronic microvascular ischemic change. No midline abnormality. Extracranial soft tissues grossly unremarkable.  MRA HEAD FINDINGS  The internal carotid arteries are widely patent. The basilar artery is widely patent with vertebrals both contributing. No proximal MCA or PCA stenosis. No intracranial aneurysm. Azygos LEFT A1 ACA. No cerebellar branch occlusion.  IMPRESSION: Large RIGHT temporal occipital lobar hemorrhage with surrounding edema. Approximate volume 21 mL. Probable trace subarachnoid and intraventricular blood. Probable lobar hypertensive bleed. Cerebral amyloid angiopathy not excluded. No obvious  underlying mass or vascular malformation.  Subcentimeter focus of acute infarction RIGHT lateral temporal lobe, remote from the hemorrhage.  No intracranial flow limiting stenosis or vascular malformation.   Electronically Signed   By: Rolla Flatten M.D.   On: 12/17/2013 16:59   Dg Chest Port 1 View  12/16/2013   CLINICAL DATA:  Confusion and fever  EXAM: PORTABLE CHEST - 1 VIEW  COMPARISON:  12/15/2013 and prior radiographs dating back to 03/04/2013  FINDINGS: Cardiomegaly and mild pulmonary vascular congestion noted.  A left IJ central venous catheter with tip overlying the lower SVC is noted.  There is no evidence of focal airspace disease, pulmonary edema, suspicious pulmonary nodule/mass, pleural effusion, or pneumothorax. No acute bony abnormalities are identified.  IMPRESSION: Cardiomegaly with mild pulmonary vascular congestion.   Electronically Signed   By: Hassan Rowan M.D.   On: 12/16/2013 14:56     Assessment/Plan: Diagnosis:  Right temporal -occipital hemorrhagic infarct 1. Does the need for close, 24 hr/day medical supervision in concert with the patient's rehab needs make it unreasonable for this patient to be served in a less intensive setting? Yes 2. Co-Morbidities requiring supervision/potential complications: htn 3. Due to bladder management, bowel management, safety, skin/wound care, disease management, medication administration, pain management and patient education, does the patient require 24 hr/day rehab  nursing? Yes 4. Does the patient require coordinated care of a physician, rehab nurse, PT (1-2 hrs/day, 5 days/week), OT (1=-2 hrs/day, 5 days/week) and SLP (1=2 hrs/day, 5 days/week) to address physical and functional deficits in the context of the above medical diagnosis(es)? Yes Addressing deficits in the following areas: balance, endurance, locomotion, strength, transferring, bowel/bladder control, bathing, dressing, feeding, grooming, toileting, cognition and psychosocial  support 5. Can the patient actively participate in an intensive therapy program of at least 3 hrs of therapy per day at least 5 days per week? Yes 6. The potential for patient to make measurable gains while on inpatient rehab is excellent 7. Anticipated functional outcomes upon discharge from inpatient rehab are supervision  with PT, supervision with OT, supervision with SLP. 8. Estimated rehab length of stay to reach the above functional goals is: 7-10 days 9. Does the patient have adequate social supports and living environment to accommodate these discharge functional goals? Yes 10. Anticipated D/C setting: Home 11. Anticipated post D/C treatments: HH therapy and Outpatient therapy 12. Overall Rehab/Functional Prognosis: excellent  RECOMMENDATIONS: This patient's condition is appropriate for continued rehabilitative care in the following setting: CIR Patient has agreed to participate in recommended program. Yes Note that insurance prior authorization may be required for reimbursement for recommended care.  Comment: Pt was active PTA and motivated to recover. Family supportive. Rehab Admissions Coordinator to follow up.  Thanks,  Meredith Staggers, MD, Mellody Drown     12/18/2013

## 2013-12-18 NOTE — Progress Notes (Signed)
RT entered patient's room to place on CPAP. Patient has his own CPAP that we are unable to hook up. Family member ststed patient has not worn CPAP in approx 4 years. Patient is not in any distress at this time. RT informed nurse to call if RT needs to bring hospital CPAP up at a later time.

## 2013-12-18 NOTE — Progress Notes (Signed)
Occupational Therapy Treatment Patient Details Name: Brian Wolf MRN: [id number] DOB: 1931-06-12 Today's Date: 12/18/2013    History of present illness 80 year old male admitted 12/14/13 due to AMS. PMH significant for HTN. CT revealed right parietal hemorragic infarct. CXR shows Cardiomegaly with mild pulmonary vascular congestion.   OT comments  Attempted to further assess vision and perception; however, pt with limited cooperation due to apparently perseverating on wanting to eat. Pt with apparent L visual field deficit. Pt appears confused at times. Declined to get out of his chair until he is able to eat. Son asking many questions about modifying the home to make is safe and accessible.Discussed need to further assess pt's abilities before making further recommendations. Educated son that home health therapists will be an excellent resource for more detailed modifications. Will attempt to return this pm to further assess. At this time, continue to recommend CIR.   Follow Up Recommendations  CIR    Equipment Recommendations  Tub/shower seat    Recommendations for Other Services Rehab consult    Precautions / Restrictions Precautions Precautions: Fall Precaution Comments: L field deficit Restrictions Weight Bearing Restrictions: No       Mobility   Balance                     ADL                                        Requires mod vc to locate objects in L field on tray.         Vision                 Additional Comments: Attempted to further assess. Difficulty with cooperation level this am. Pt not seeing/attending to items in left field and requires cues for head turning to identify objects. Attempted to assess visual perceptual deficits. Completed clock copy task with poor spatial awareness; attempted line bisection task, however, pt was unable to complete. Asked pt to trace lines and he was only able to trace R side of line.  Appeasr frustrated with task and states his vision is fine.    Perception     Praxis      Cognition   Behavior During Therapy: Restless Overall Cognitive Status: Impaired/Different from baseline Area of Impairment: Attention   Current Attention Level: Sustained (internally distracted)      Safety/Judgement: Decreased awareness of safety;Decreased awareness of deficits   Problem Solving: Slow processing General Comments: difficult to assess due to cooperation this am. Pt perseverating on wanting to eat    Extremity/Trunk Assessment               Exercises     Shoulder Instructions       General Comments      Pertinent Vitals/ Pain       Pain Assessment: Faces Faces Pain Scale: Hurts little more Pain Location: toes Pain Descriptors / Indicators: Grimacing Pain Intervention(s): Limited activity within patient's tolerance  Home Living                                          Prior Functioning/Environment              Frequency Min 2X/week     Progress Toward Goals  OT Goals(current goals can now be found in the care plan section)  Progress towards OT goals: Progressing toward goals  Acute Rehab OT Goals Patient Stated Goal: to go home OT Goal Formulation: With patient/family Time For Goal Achievement: 12/24/13 Potential to Achieve Goals: Good ADL Goals Pt Will Perform Eating: with modified independence;sitting Pt Will Perform Grooming: with supervision;standing Pt Will Perform Upper Body Bathing: with supervision;sitting;standing Pt Will Perform Lower Body Bathing: with supervision;sit to/from stand Additional ADL Goal #1: Pt will be able to locate items in his left visual field in new environments without VCs  Plan Discharge plan remains appropriate    Co-evaluation                 End of Session     Activity Tolerance Other (comment) (limited by level of cooperation)   Patient Left in chair;with call bell/phone  within reach;with family/visitor present   Nurse Communication Mobility status;Other (comment) (request for food)        Time: 1140-1210 OT Time Calculation (min): 30 min  Charges: OT General Charges $OT Visit: 1 Procedure OT Treatments $Therapeutic Activity: 23-37 mins  Martese Vanatta,HILLARY 12/18/2013, 12:39 PM   [facility], OTR/L  [phone] 12/18/2013

## 2013-12-18 NOTE — Progress Notes (Signed)
Patient's son was able to hook up patient's CPAP and patient is wearing CPAP at this time with sats at 97%.

## 2013-12-18 NOTE — Plan of Care (Signed)
Problem: Acute Treatment Outcomes Goal: Other Acute Treatment Outcomes Outcome: Not Applicable Date Met:  04/49/25  Problem: Progression Outcomes Goal: Tolerating diet/TF at goal rate Outcome: Completed/Met Date Met:  12/18/13 Goal: Pain controlled Outcome: Completed/Met Date Met:  12/18/13

## 2013-12-18 NOTE — Progress Notes (Signed)
STROKE TEAM PROGRESS NOTE   HISTORY Brian Wolf is an 80 y.o. male who was noted yesterday to by wife that he was acting strange. He was complaining of a right temporal pain when he coughed and seemed off balance. She noted he went out to the drive way and seemed to be walking to the right and then was not talking sensibly. Wife also noted some weakness in his left hand. This AM she brought him to his PCP who directed them to the ED. On arrival to ED initial CT scan showed a large right acute hemorraghic infarct. Currently patient is complaining of a slight pain on the right temple, and that he is thirsty. He will answer some questions but desires to not take part exam.   Date last known well: Date: 12/13/2013 Time last known well: Unable to determine tPA Given: No: ICH   SUBJECTIVE (INTERVAL HISTORY) His son is at the bedside.  Overall he feels his condition is much improved. Awake alert and conversing well. repeat CT 12/16/13 showed no significant change.Remains afebrile.BP controlled.   OBJECTIVE Temp:  [97.8 F (36.6 C)-99.1 F (37.3 C)] 98.9 F (37.2 C) (11/17 0800) Pulse Rate:  [72-87] 85 (11/17 1000) Cardiac Rhythm:  [-] Normal sinus rhythm (11/17 0800) Resp:  [17-30] 26 (11/17 1000) BP: (112-180)/(39-87) 163/73 mmHg (11/17 1000) SpO2:  [88 %-100 %] 98 % (11/17 1000)   Recent Labs Lab 12/17/13 1702 12/17/13 2013 12/18/13 0109 12/18/13 0835 12/18/13 1155  GLUCAP 123* 140* 122* 139* 118*    Recent Labs Lab 12/14/13 1137 12/14/13 1550 12/15/13 0342  12/16/13 0400  12/17/13 0545 12/17/13 1209 12/17/13 1745 12/18/13 12/18/13 0635  NA 138 137 137  < > 139  < > 145 141 140 137 137  K 4.2 4.1 3.9  --  3.4*  --  3.0*  --   --   --   --   CL 98 98 97  --  103  --  109  --   --   --   --   CO2 26 23 23   --  25  --  25  --   --   --   --   GLUCOSE 129* 120* 133*  --  125*  --  113*  --   --   --   --   BUN 26* 26* 25*  --  21  --  16  --   --   --   --    CREATININE 0.92 0.88 1.02  --  0.96  --  0.86  --   --   --   --   CALCIUM 9.3 8.9 8.6  --  7.4*  --  7.1*  --   --   --   --   < > = values in this interval not displayed.  Recent Labs Lab 12/14/13 1137 12/14/13 1550  AST 24 26  ALT 18 18  ALKPHOS 46 45  BILITOT 0.7 0.7  PROT 8.2 7.8  ALBUMIN 3.7 3.5    Recent Labs Lab 12/14/13 1137 12/14/13 1550 12/15/13 0342 12/16/13 0400 12/17/13 0545  WBC 13.5* 16.0* 13.7* 12.8* 12.9*  NEUTROABS 9.9* 11.2*  --   --   --   HGB 13.5 13.3 12.8* 10.5* 10.1*  HCT 40.8 39.7 39.5 31.4* 31.2*  MCV 91.3 91.1 93.6 93.2 93.1  PLT 279 PLATELETS APPEAR ADEQUATE 239 194 201   No results for input(s): CKTOTAL, CKMB, CKMBINDEX, TROPONINI in the last  168 hours. No results for input(s): LABPROT, INR in the last 72 hours.  Recent Labs  12/16/13 0929  COLORURINE YELLOW  LABSPEC 1.017  PHURINE 5.5  GLUCOSEU NEGATIVE  HGBUR MODERATE*  BILIRUBINUR NEGATIVE  KETONESUR NEGATIVE  PROTEINUR 30*  UROBILINOGEN 0.2  NITRITE POSITIVE*  LEUKOCYTESUR MODERATE*       Component Value Date/Time   CHOL 147 12/15/2013 0342   TRIG 84 12/15/2013 0342   HDL 54 12/15/2013 0342   CHOLHDL 2.7 12/15/2013 0342   VLDL 17 12/15/2013 0342   LDLCALC 76 12/15/2013 0342   Lab Results  Component Value Date   HGBA1C 6.4* 12/15/2013   No results found for: LABOPIA, COCAINSCRNUR, LABBENZ, AMPHETMU, THCU, LABBARB  No results for input(s): ETH in the last 168 hours.  I have personally reviewed the radiological images below and agree with the radiology interpretations.  Dg Chest 2 View  12/14/2013   IMPRESSION: Enlargement of cardiac silhouette.  Minimal bronchitic changes and LEFT basilar atelectasis.     Ct Head Wo Contrast  12/15/13 - Evolving, relatively stable RIGHT parietal intraparenchymal hematoma with resultant 3 mm RIGHT to LEFT midline shift. Mild RIGHT lateral ventricle effacement without entrapment or hydrocephalus. Moderate to severe white matter  changes suggest chronic small vessel ischemic disease.  12/15/2013   IMPRESSION: Evolving RIGHT parietal intraparenchymal hematoma with resultant 4 mm RIGHT to LEFT midline shift. No ventricular entrapment/ hydrocephalus.  Severe white matter changes likely reflect chronic small vessel ischemic disease.      12/14/2013   IMPRESSION: Large acute hemorrhagic infarct in the posterior RIGHT parietal lobe, area of hemorrhage 5.3 x 3.3 cm with surrounding vasogenic edema and minimal RIGHT LEFT midline shift.  Underlying atrophy and small vessel chronic ischemic changes of deep cerebral white matter.     2D echo - - Left ventricle: The cavity size was normal. Wall thickness was normal. Systolic function was normal. The estimated ejection fraction was in the range of 55% to 60%. Wall motion was normal; there were no regional wall motion abnormalities. Doppler parameters are consistent with abnormal left ventricular relaxation (grade 1 diastolic dysfunction). - Aortic valve: There was mild regurgitation. - Left atrium: The atrium was moderately dilated. - Pulmonary arteries: Systolic pressure was mildly to moderately increased. PA peak pressure: 40 mm Hg (S).  Urine c/s : enterococcus sensitive to levaquin  PHYSICAL EXAM  Temp:  [97.8 F (36.6 C)-99.1 F (37.3 C)] 98.9 F (37.2 C) (11/17 0800) Pulse Rate:  [72-87] 85 (11/17 1000) Resp:  [17-30] 26 (11/17 1000) BP: (112-180)/(39-87) 163/73 mmHg (11/17 1000) SpO2:  [88 %-100 %] 98 % (11/17 1000)  General - Well nourished, well developed [place] male,  Ophthalmologic - not able to see through.  Cardiovascular - Regular rate and rhythm with no murmur.  Mental Status -  Awake alert. Confused. Mildly disoriented Language including expression, naming, repetition, comprehension was assessed and found intact.  Cranial Nerves II - XII - II - left visual field deficit/neglect. III, IV, VI - Extraocular movements intact. V - Facial  sensation intact bilaterally. VII - Facial movement intact bilaterally. VIII - Hearing & vestibular intact bilaterally. X - Palate elevates symmetrically. XI - [place] turning & shoulder shrug intact bilaterally. XII - Tongue protrusion intact.  Motor Strength - The patient's strength was normal in all extremities .no drift was present.  Bulk was normal and fasciculations were absent.   Motor Tone - Muscle tone was assessed at the neck and appendages and was normal.  Reflexes -  The patient's reflexes were normal in all extremities and he had no pathological reflexes.  Sensory - Light touch, temperature/pinprick were assessed and were normal.    Coordination - The patient had normal movements in the hands with no ataxia or dysmetria.  Tremor was absent.  Gait and Station - not tested due to labile BP.   ASSESSMENT/PLAN [name] is a 80 y.o. male with history of HTN presenting with left side weakness, confusion and off balance with HA. He did not receive IV t-PA due to [place].    ICH:  Non-dominant right parietofrontal hemorrhage ~ 40cc, etiology unclear but likely due to HTN vs. CAA  CT showed left large frontoparietal bleeding  Repeat CT showed stable hematoma  MRI and MRA shows lobar right temporopareital hematoma with mild cytotoxic edema but no underlying tumor, amyloid angiopathy AVM or aneurysms aor vascular occlusion2D Echo  unremarkable  LDL 76, not at goal < 70  HgbA1c 6.4, at the goal < 6.5  SCDs for VTE prophylaxis  Diet Heart   aspirin 81 mg orally every day prior to admission, now on no antithrombotics  Ongoing aggressive risk factor management  Therapy recommendations:  pending  Disposition:  Pending  Cerebral edema - repeat CT showed stable hematoma size and persistent edema but clinically patient doing better - 3 % saline now tapered off- Na today 137. -   Urinary Tract Infection   - Tmax 101.9 on 12/15/13 now resolved - cloudy urine, UA and  culture grew enterococcus - change cipro to Levaquin per pharmacy - blood culture sent out also  Hypertension  Home meds:   HCTZ, losartan and metoprolo, resume in hosptial  Unstable  BP goal < 180 Increase HCTZ to 37.5 mg daily for SBP goal below 180 Hyperlipidemia  Home meds:  none  LDL 76, not at goal < 70  Hold off statin now due to [place]  Other Stroke Risk Factors  Advanced age  . Mobilize out of bed, . Transfer to stroke unit floor later today Await rehab consult  Hospital day # 4    I have personally examined this patient, reviewed notes, independently viewed imaging studies, participated in medical decision making and plan of care. I have made any additions or clarifications directly to the above note. Agree with note above. D/W son and answered questions.  Antony Contras, MD Medical Director [place] Pager: [phone] 12/18/2013 2:01 PM   Antony Contras, MD Stroke Neurology 12/18/2013 1:53 PM      To contact Stroke Continuity provider, please refer to [email or web address] After hours, contact General Neurology

## 2013-12-18 NOTE — Clinical Documentation Improvement (Signed)
  The following info is documented per PNs. Please document the Diagnosis being treated.   - Tmax 101.9 - cloudy urine, UA and culture grew enterococcus - change cipro to Levaquin per pharmacy  Thank you,  Ezekiel Ina ,RN Clinical Documentation Specialist:  [phone]  [facility] Information Management

## 2013-12-18 NOTE — Progress Notes (Signed)
UR completed.  Acute therapies recommending CIR. Consult pending.   Sandi Mariscal, RN BSN [place] CCM Trauma/Neuro ICU Case Manager [phone]

## 2013-12-18 NOTE — Progress Notes (Signed)
Inpatient Rehabilitation  I met [name] at the bedside and started discussion for post acute rehab needs.  I then called his wife who confirms her desire for pt. to be considered for IP Rehab.  I will submit for insurance authorization and will notify team when I receive a decision.  Pt's admission to rehab will depend on medical readiness, insurance approval and bed availability.  I will follow up tomorrow.  Please call if questions.  [place] Admissions Coordinator Cell [phone] Office [phone]

## 2013-12-19 DIAGNOSIS — M10071 Idiopathic gout, right ankle and foot: Secondary | ICD-10-CM

## 2013-12-19 DIAGNOSIS — E876 Hypokalemia: Secondary | ICD-10-CM

## 2013-12-19 DIAGNOSIS — R400 Somnolence: Secondary | ICD-10-CM

## 2013-12-19 LAB — BASIC METABOLIC PANEL
ANION GAP: 12 (ref 5–15)
BUN: 13 mg/dL (ref 6–23)
CHLORIDE: 91 meq/L — AB (ref 96–112)
CO2: 27 mEq/L (ref 19–32)
Calcium: 7.7 mg/dL — ABNORMAL LOW (ref 8.4–10.5)
Creatinine, Ser: 0.82 mg/dL (ref 0.50–1.35)
GFR calc non Af Amer: 81 mL/min — ABNORMAL LOW (ref >90)
Glucose, Bld: 112 mg/dL — ABNORMAL HIGH (ref 70–99)
POTASSIUM: 3 meq/L — AB (ref 3.7–5.3)
Sodium: 130 mEq/L — ABNORMAL LOW (ref 137–147)

## 2013-12-19 MED ORDER — POTASSIUM CHLORIDE CRYS ER 20 MEQ PO TBCR
40.0000 meq | EXTENDED_RELEASE_TABLET | Freq: Two times a day (BID) | ORAL | Status: DC
  Administered 2013-12-19 – 2013-12-20 (×3): 40 meq via ORAL
  Filled 2013-12-19 (×4): qty 2

## 2013-12-19 MED ORDER — COLCHICINE 0.6 MG PO TABS
0.6000 mg | ORAL_TABLET | Freq: Two times a day (BID) | ORAL | Status: DC
  Administered 2013-12-19 – 2013-12-20 (×3): 0.6 mg via ORAL
  Filled 2013-12-19 (×3): qty 1

## 2013-12-19 NOTE — Progress Notes (Signed)
STROKE TEAM PROGRESS NOTE   HISTORY Brian Wolf is an 80 y.o. male who was noted yesterday to by wife that he was acting strange. He was complaining of a right temporal pain when he coughed and seemed off balance. She noted he went out to the drive way and seemed to be walking to the right and then was not talking sensibly. Wife also noted some weakness in his left hand. This AM she brought him to his PCP who directed them to the ED. On arrival to ED initial CT scan showed a large right acute hemorraghic infarct. Currently patient is complaining of a slight pain on the right temple, and that he is thirsty. He will answer some questions but desires to not take part exam.   Date last known well: Date: 12/13/2013 Time last known well: Unable to determine tPA Given: No: ICH   SUBJECTIVE (INTERVAL HISTORY) His wife is at the bedside.  Overall he feels his condition is   improving.  Remains afebrile.BP controlled.  Has new complaint of right pain which on exam appears to be swollen and maybe gout flareup  OBJECTIVE Temp:  [98.1 F (36.7 C)-100.4 F (38 C)] 99 F (37.2 C) (11/18 1447) Pulse Rate:  [75-91] 77 (11/18 1447) Cardiac Rhythm:  [-] Normal sinus rhythm (11/18 0800) Resp:  [18-25] 20 (11/18 1447) BP: (141-167)/(52-89) 141/89 mmHg (11/18 1447) SpO2:  [96 %-100 %] 99 % (11/18 1439)   Recent Labs Lab 12/17/13 1702 12/17/13 2013 12/18/13 0109 12/18/13 0835 12/18/13 1155  GLUCAP 123* 140* 122* 139* 118*    Recent Labs Lab 12/14/13 1550 12/15/13 0342  12/16/13 0400  12/17/13 0545 12/17/13 1209 12/17/13 1745 12/18/13 12/18/13 0635 12/19/13 0744  NA 137 137  < > 139  < > 145 141 140 137 137 130*  K 4.1 3.9  --  3.4*  --  3.0*  --   --   --   --  3.0*  CL 98 97  --  103  --  109  --   --   --   --  91*  CO2 23 23  --  25  --  25  --   --   --   --  27  GLUCOSE 120* 133*  --  125*  --  113*  --   --   --   --  112*  BUN 26* 25*  --  21  --  16  --   --   --   --  13   CREATININE 0.88 1.02  --  0.96  --  0.86  --   --   --   --  0.82  CALCIUM 8.9 8.6  --  7.4*  --  7.1*  --   --   --   --  7.7*  < > = values in this interval not displayed.  Recent Labs Lab 12/14/13 1137 12/14/13 1550  AST 24 26  ALT 18 18  ALKPHOS 46 45  BILITOT 0.7 0.7  PROT 8.2 7.8  ALBUMIN 3.7 3.5    Recent Labs Lab 12/14/13 1137 12/14/13 1550 12/15/13 0342 12/16/13 0400 12/17/13 0545  WBC 13.5* 16.0* 13.7* 12.8* 12.9*  NEUTROABS 9.9* 11.2*  --   --   --   HGB 13.5 13.3 12.8* 10.5* 10.1*  HCT 40.8 39.7 39.5 31.4* 31.2*  MCV 91.3 91.1 93.6 93.2 93.1  PLT 279 PLATELETS APPEAR ADEQUATE 239 194 201   No results for  input(s): CKTOTAL, CKMB, CKMBINDEX, TROPONINI in the last 168 hours. No results for input(s): LABPROT, INR in the last 72 hours. No results for input(s): COLORURINE, LABSPEC, [place], GLUCOSEU, HGBUR, BILIRUBINUR, KETONESUR, PROTEINUR, UROBILINOGEN, NITRITE, LEUKOCYTESUR in the last 72 hours.  Invalid input(s): APPERANCEUR     Component Value Date/Time   CHOL 147 12/15/2013 0342   TRIG 84 12/15/2013 0342   HDL 54 12/15/2013 0342   CHOLHDL 2.7 12/15/2013 0342   VLDL 17 12/15/2013 0342   LDLCALC 76 12/15/2013 0342   Lab Results  Component Value Date   HGBA1C 6.4* 12/15/2013   No results found for: LABOPIA, COCAINSCRNUR, LABBENZ, AMPHETMU, THCU, LABBARB  No results for input(s): ETH in the last 168 hours.  I have personally reviewed the radiological images below and agree with the radiology interpretations.  Dg Chest 2 View  12/14/2013   IMPRESSION: Enlargement of cardiac silhouette.  Minimal bronchitic changes and LEFT basilar atelectasis.     Ct Head Wo Contrast  12/15/13 - Evolving, relatively stable RIGHT parietal intraparenchymal hematoma with resultant 3 mm RIGHT to LEFT midline shift. Mild RIGHT lateral ventricle effacement without entrapment or hydrocephalus. Moderate to severe white matter changes suggest chronic small vessel ischemic  disease.  12/15/2013   IMPRESSION: Evolving RIGHT parietal intraparenchymal hematoma with resultant 4 mm RIGHT to LEFT midline shift. No ventricular entrapment/ hydrocephalus.  Severe white matter changes likely reflect chronic small vessel ischemic disease.      12/14/2013   IMPRESSION: Large acute hemorrhagic infarct in the posterior RIGHT parietal lobe, area of hemorrhage 5.3 x 3.3 cm with surrounding vasogenic edema and minimal RIGHT LEFT midline shift.  Underlying atrophy and small vessel chronic ischemic changes of deep cerebral white matter.     2D echo - - Left ventricle: The cavity size was normal. Wall thickness was normal. Systolic function was normal. The estimated ejection fraction was in the range of 55% to 60%. Wall motion was normal; there were no regional wall motion abnormalities. Doppler parameters are consistent with abnormal left ventricular relaxation (grade 1 diastolic dysfunction). - Aortic valve: There was mild regurgitation. - Left atrium: The atrium was moderately dilated. - Pulmonary arteries: Systolic pressure was mildly to moderately increased. PA peak pressure: 40 mm Hg (S).  Urine c/s : enterococcus sensitive to levaquin  PHYSICAL EXAM  Temp:  [98.1 F (36.7 C)-100.4 F (38 C)] 99 F (37.2 C) (11/18 1447) Pulse Rate:  [75-91] 77 (11/18 1447) Resp:  [18-25] 20 (11/18 1447) BP: (141-167)/(52-89) 141/89 mmHg (11/18 1447) SpO2:  [96 %-100 %] 99 % (11/18 1439)  General - Well nourished, well developed [place] male,  Ophthalmologic - not able to see through.  Cardiovascular - Regular rate and rhythm with no murmur. Right great toe is swollen and red at its base and tender to touch Mental Status -  Awake alert. Confused. Mildly disoriented Language including expression, naming, repetition, comprehension was assessed and found intact.  Cranial Nerves II - XII - II - left visual field deficit/neglect. III, IV, VI - Extraocular movements  intact. V - Facial sensation intact bilaterally. VII - Facial movement intact bilaterally. VIII - Hearing & vestibular intact bilaterally. X - Palate elevates symmetrically. XI - [place] turning & shoulder shrug intact bilaterally. XII - Tongue protrusion intact.  Motor Strength - The patient's strength was normal in all extremities .no drift was present.  Bulk was normal and fasciculations were absent.   Motor Tone - Muscle tone was assessed at the neck and appendages and  was normal.  Reflexes - The patient's reflexes were normal in all extremities and he had no pathological reflexes.  Sensory - Light touch, temperature/pinprick were assessed and were normal.    Coordination - The patient had normal movements in the hands with no ataxia or dysmetria.  Tremor was absent.  Gait and Station - not tested due to labile BP.   ASSESSMENT/PLAN [name] is a 80 y.o. male with history of HTN presenting with left side weakness, confusion and off balance with HA. He did not receive IV t-PA due to [place].    ICH:  Non-dominant right parietofrontal hemorrhage ~ 40cc, etiology unclear but likely due to HTN vs. CAA  CT showed left large frontoparietal bleeding  Repeat CT showed stable hematoma  MRI and MRA shows lobar right temporopareital hematoma with mild cytotoxic edema but no underlying tumor, amyloid angiopathy AVM or aneurysms aor vascular occlusion  2D Echo  unremarkable  LDL 76, not at goal < 70  HgbA1c 6.4, at the goal < 6.5  SCDs for VTE prophylaxis  Diet Heart   aspirin 81 mg orally every day prior to admission, now on no antithrombotics  Ongoing aggressive risk factor management  Therapy recommendations:  pending  Disposition:  Pending  Cerebral edema - repeat CT showed stable hematoma size and persistent edema but clinically patient doing better - 3 % saline now tapered off- Na today 137. -   Urinary Tract Infection   - Tmax 101.9 on 12/15/13 now  resolved - cloudy urine, UA and culture grew enterococcus - change cipro to Levaquin per pharmacy - blood culture sent out also  Hypertension  Home meds:   HCTZ, losartan and metoprolo, resume in hosptial  Unstable  BP goal < 180 Increase HCTZ to 37.5 mg daily for SBP goal below 180 Hyperlipidemia  Home meds:  none  LDL 76, not at goal < 70  Hold off statin now due to [place]  Right great toe pain/swelling- likely gout will treat with cochicine Hypokalemia- likely HCTZ induced. Will replace potassium Other Stroke Risk Factors  Advanced age  . Mobilize out of bed, .   Await rehab decision  Hospital day # 5    I have personally examined this patient, reviewed notes, independently viewed imaging studies, participated in medical decision making and plan of care. I have made any additions or clarifications directly to the above note. Agree with note above. D/W son and answered questions.  Antony Contras, MD Medical Director [facility] Pager: [phone] 12/19/2013 3:22 PM        To contact Stroke Continuity provider, please refer to [email or web address] After hours, contact General Neurology

## 2013-12-19 NOTE — Progress Notes (Signed)
Physical Therapy Treatment Patient Details Name: Brian Wolf MRN: [id number] DOB: 1931-05-02 Today's Date: 12/19/2013    History of Present Illness 80 year old male admitted 12/14/13 due to AMS. PMH significant for HTN. Large RIGHT temporal occipital lobar hemorrhage with surrounding edema. CXR shows Cardiomegaly with mild pulmonary vascular congestion.    PT Comments    Patient required max encouragement to participate in therapy. Limited weight bearing through LLE due to pain. Able to SPT from bed to bcs to recliner with +2 mod A. Patient continues to be a great candidate from CIR. Talked with son on phone regarding home set up and patients progress  Follow Up Recommendations  CIR     Equipment Recommendations       Recommendations for Other Services       Precautions / Restrictions Precautions Precautions: Fall Precaution Comments: L field deficit    Mobility  Bed Mobility Overal bed mobility: Needs Assistance Bed Mobility: Rolling Rolling: Min assist   Supine to sit: Min assist     General bed mobility comments: A to ensure balance and control with sitting EOB. A for trunk support into sitting. Use of rails  Transfers Overall transfer level: Needs assistance Equipment used: 2 person hand held assist Transfers: Sit to/from Stand Sit to Stand: Mod assist;+2 physical assistance Stand pivot transfers: Mod assist;+2 physical assistance       General transfer comment: Increased assistance required as patient hesitant with movement due to increase foot pain. Cues for sfae techique and positioning. Increased time. Patient required increased time for small steps.   Ambulation/Gait             General Gait Details: unable to attempt due to foot pain and patient refusing to attempt   Stairs            Wheelchair Mobility    Modified Rankin (Stroke Patients Only) Modified Rankin (Stroke Patients Only) Pre-Morbid Rankin Score: No symptoms Modified  Rankin: Moderately severe disability     Balance Overall balance assessment: Needs assistance         Standing balance support: Bilateral upper extremity supported;During functional activity Standing balance-[name]: Poor                      Cognition Arousal/Alertness: Awake/alert Behavior During Therapy: Restless;Impulsive Overall Cognitive Status: Impaired/Different from baseline Area of Impairment: Attention;Memory;Following commands;Safety/judgement;Awareness;Problem solving   Current Attention Level: Selective   Following Commands: Follows one step commands with increased time Safety/Judgement: Decreased awareness of safety;Decreased awareness of deficits Awareness: Intellectual Problem Solving: Slow processing;Decreased initiation;Difficulty sequencing;Requires verbal cues;Requires tactile cues General Comments: Pt required Mod A +2 with mobility and was unable to tourn to sito ntoilet and states that he could go home saefly alone    Exercises      General Comments        Pertinent Vitals/Pain Pain Assessment: Faces Faces Pain Scale: Hurts even more Pain Location: toe Pain Descriptors / Indicators: Grimacing Pain Intervention(s): Monitored during session;Repositioned    Home Living                      Prior Function            PT Goals (current goals can now be found in the care plan section) Acute Rehab PT Goals Patient Stated Goal: to go home Progress towards PT goals: Progressing toward goals    Frequency  Min 4X/week    PT Plan Current plan remains  appropriate    Co-evaluation             End of Session Equipment Utilized During Treatment: Gait belt Activity Tolerance: Patient tolerated treatment well;Patient limited by pain Patient left: in chair;with call bell/phone within reach;with nursing/sitter in room     Time: 1130-1158 PT Time Calculation (min) (ACUTE ONLY): 28 min  Charges:  $Therapeutic Activity:  23-37 mins                    G Codes:      Jacqualyn Posey 12/19/2013, 2:58 PM 12/19/2013 Jacqualyn Posey PTA [phone] pager [phone] office

## 2013-12-19 NOTE — Progress Notes (Signed)
Inpatient Rehabilitation  I stopped by to visit Brian Wolf and I informed him that I await insurance decision. Per his sitter Brian Wolf, wife has stepped off the floor momentarily but she will let wife know I stopped by and left 2 rehab brochures for her. I will update team as soon as I hear from insurance company.  Admission to rehab will depend on medical readiness, insurance authorization and bed availability.   Please call if questions.  [place] Admissions Coordinator Cell [phone] Office [phone]

## 2013-12-19 NOTE — Progress Notes (Signed)
Occupational Therapy Treatment Patient Details Name: Brian Wolf MRN: [id number] DOB: 03/19/1931 Today's Date: 12/19/2013    History of present illness 80 year old male admitted 12/14/13 due to AMS. PMH significant for HTN. Large RIGHT temporal occipital lobar hemorrhage with surrounding edema. CXR shows Cardiomegaly with mild pulmonary vascular congestion.   OT comments  Pt demonstrates a decline in functional mobility and ADL since initial evaluation. L visual field deficit in addition to L inattention. High risk for falls. Apparent cognitive deficits. Pt is excellent CIR candidate. Will continue to follow acutely to address established goals.  Follow Up Recommendations  CIR;Supervision/Assistance - 24 hour    Equipment Recommendations  Tub/shower seat    Recommendations for Other Services Rehab consult    Precautions / Restrictions Precautions Precautions: Fall Precaution Comments: L visual field deficit/ L inattention      Mobility Bed Mobility    up in chair              Transfers Overall transfer level: Needs assistance Equipment used: 2 person hand held assist Transfers: Sit to/from [facility] Sit to Stand: Mod assist;+2 physical assistance Stand pivot transfers: Mod assist;+2 physical assistance       General transfer comment: Increased assistance required this pm  Unable to achieve full upright posture in standing.    Balance Overall balance assessment: Needs assistance         Standing balance support: Bilateral upper extremity supported;During functional activity Standing balance-[name]: Poor                     ADL       Grooming: Supervision/safety;Set up;Cueing for compensatory techniques   Upper Body Bathing: Minimal assitance;Sitting       Upper Body Dressing : Moderate assistance;Sitting       Toilet Transfer: Moderate assistance;Ambulation;Comfort height toilet;+2 for physical assistance;+2  for safety/equipment. Stood at grab bar with toilet on L x 5 min. Unable to facilitate pt turning to L to sit on toilet. Had to move [facility] behind pt as pt would not turn toward left. Same situation when walking back to recliner with +2Mod A and trying to turn to L to sit. Required movement of recliner behind pt.   Required manual assist to release grab bar with L hand when toileting.  Toileting- Clothing Manipulation and Hygiene: Maximal assistance;Sit to/from stand         General ADL Comments: apparent decline in function since intitial eval.      Vision    L homonymous hemianopsia             Additional Comments: Pt unable to see toilet on his left or chair on his left   Perception     Praxis      Cognition   Behavior During Therapy: Restless;Impulsive Overall Cognitive Status: Impaired/Different from baseline Area of Impairment: Attention;Memory;Following commands;Safety/judgement;Awareness;Problem solving   Current Attention Level: Selective    Following Commands: Follows one step commands with increased time Safety/Judgement: Decreased awareness of safety;Decreased awareness of deficits Awareness: Intellectual Problem Solving: Slow processing;Decreased initiation;Difficulty sequencing;Requires verbal cues;Requires tactile cues General Comments: Pt required Mod A +2 with mobility and was unable to tourn to sito ntoilet and states that he could go home saefly alone    Extremity/Trunk Assessment               Exercises     Shoulder Instructions       General Comments  Pertinent Vitals/ Pain       Pain Assessment: Faces Faces Pain Scale: Hurts even more Pain Location: "foot" Pain Descriptors / Indicators: Moaning;Grimacing Pain Intervention(s): Monitored during session;Repositioned  Home Living                                          Prior Functioning/Environment              Frequency Min 2X/week     Progress Toward  Goals  OT Goals(current goals can now be found in the care plan section)  Progress towards OT goals: Progressing toward goals  Acute Rehab OT Goals Patient Stated Goal: to go home OT Goal Formulation: With patient/family Time For Goal Achievement: 12/24/13 Potential to Achieve Goals: Good ADL Goals Pt Will Perform Eating: with modified independence;sitting Pt Will Perform Grooming: with supervision;standing Pt Will Perform Upper Body Bathing: with supervision;sitting;standing Pt Will Perform Lower Body Bathing: with supervision;sit to/from stand Additional ADL Goal #1: Pt will be able to locate items in his left visual field in new environments without VCs  Plan Discharge plan remains appropriate    Co-evaluation                 End of Session Equipment Utilized During Treatment: Gait belt   Activity Tolerance Patient limited by pain (foot)   Patient Left in chair;with call bell/phone within reach;with family/visitor present   Nurse Communication Mobility status;Other (comment) (L visual field deficit)        Time: [id number] OT Time Calculation (min): 39 min  Charges: OT General Charges $OT Visit: 1 Procedure OT Treatments $Self Care/Home Management : 38-52 mins  Adiana Smelcer,HILLARY 12/19/2013, 2:54 PM   [facility], OTR/L  [phone] 12/19/2013

## 2013-12-19 NOTE — Progress Notes (Signed)
Pt family want the Doctor to look at his Right  Big toe because of redness and pain. As well family are concerned about the patient potassium level

## 2013-12-20 ENCOUNTER — Inpatient Hospital Stay (HOSPITAL_COMMUNITY)
Admission: RE | Admit: 2013-12-20 | Discharge: 2013-12-26 | DRG: 057 | Disposition: A | Discharge status: Home Health | Payer: Medicare Other | Source: Intra-hospital | Attending: Physical Medicine & Rehabilitation | Admitting: Physical Medicine & Rehabilitation

## 2013-12-20 DIAGNOSIS — I611 Nontraumatic intracerebral hemorrhage in hemisphere, cortical: Secondary | ICD-10-CM

## 2013-12-20 DIAGNOSIS — Z79899 Other long term (current) drug therapy: Secondary | ICD-10-CM

## 2013-12-20 DIAGNOSIS — I10 Essential (primary) hypertension: Secondary | ICD-10-CM

## 2013-12-20 DIAGNOSIS — I6931 Cognitive deficits following cerebral infarction: Secondary | ICD-10-CM

## 2013-12-20 DIAGNOSIS — I69398 Other sequelae of cerebral infarction: Secondary | ICD-10-CM

## 2013-12-20 DIAGNOSIS — E876 Hypokalemia: Secondary | ICD-10-CM | POA: Diagnosis not present

## 2013-12-20 DIAGNOSIS — M109 Gout, unspecified: Secondary | ICD-10-CM | POA: Diagnosis present

## 2013-12-20 DIAGNOSIS — B952 Enterococcus as the cause of diseases classified elsewhere: Secondary | ICD-10-CM | POA: Diagnosis not present

## 2013-12-20 DIAGNOSIS — Z7982 Long term (current) use of aspirin: Secondary | ICD-10-CM | POA: Diagnosis not present

## 2013-12-20 DIAGNOSIS — H53469 Homonymous bilateral field defects, unspecified side: Secondary | ICD-10-CM

## 2013-12-20 DIAGNOSIS — I6389 Other cerebral infarction: Secondary | ICD-10-CM | POA: Diagnosis present

## 2013-12-20 DIAGNOSIS — Z9119 Patient's noncompliance with other medical treatment and regimen: Secondary | ICD-10-CM

## 2013-12-20 DIAGNOSIS — H547 Unspecified visual loss: Secondary | ICD-10-CM

## 2013-12-20 DIAGNOSIS — I619 Nontraumatic intracerebral hemorrhage, unspecified: Secondary | ICD-10-CM | POA: Diagnosis present

## 2013-12-20 DIAGNOSIS — I69393 Ataxia following cerebral infarction: Principal | ICD-10-CM

## 2013-12-20 DIAGNOSIS — M10472 Other secondary gout, left ankle and foot: Secondary | ICD-10-CM | POA: Insufficient documentation

## 2013-12-20 DIAGNOSIS — G4730 Sleep apnea, unspecified: Secondary | ICD-10-CM

## 2013-12-20 DIAGNOSIS — N390 Urinary tract infection, site not specified: Secondary | ICD-10-CM | POA: Diagnosis not present

## 2013-12-20 DIAGNOSIS — R414 Neurologic neglect syndrome: Secondary | ICD-10-CM | POA: Diagnosis present

## 2013-12-20 DIAGNOSIS — T502X5A Adverse effect of carbonic-anhydrase inhibitors, benzothiadiazides and other diuretics, initial encounter: Secondary | ICD-10-CM

## 2013-12-20 DIAGNOSIS — D62 Acute posthemorrhagic anemia: Secondary | ICD-10-CM

## 2013-12-20 LAB — CULTURE, BLOOD (ROUTINE X 2)
Culture: NO GROWTH
Culture: NO GROWTH

## 2013-12-20 LAB — BASIC METABOLIC PANEL
Anion gap: 14 (ref 5–15)
BUN: 18 mg/dL (ref 6–23)
CO2: 28 meq/L (ref 19–32)
Calcium: 8.1 mg/dL — ABNORMAL LOW (ref 8.4–10.5)
Chloride: 94 mEq/L — ABNORMAL LOW (ref 96–112)
Creatinine, Ser: 0.88 mg/dL (ref 0.50–1.35)
GFR calc Af Amer: >90 mL/min (ref >90)
GFR, EST NON AFRICAN AMERICAN: 79 mL/min — AB (ref >90)
GLUCOSE: 105 mg/dL — AB (ref 70–99)
POTASSIUM: 3.5 meq/L — AB (ref 3.7–5.3)
SODIUM: 136 meq/L — AB (ref 137–147)

## 2013-12-20 LAB — CBC
HCT: 32.8 % — ABNORMAL LOW (ref 39.0–52.0)
HEMOGLOBIN: 10.9 g/dL — AB (ref 13.0–17.0)
MCH: 29.7 pg (ref 26.0–34.0)
MCHC: 33.2 g/dL (ref 30.0–36.0)
MCV: 89.4 fL (ref 78.0–100.0)
Platelets: 269 10*3/uL (ref 150–400)
RBC: 3.67 MIL/uL — AB (ref 4.22–5.81)
RDW: 13.4 % (ref 11.5–15.5)
WBC: 13.9 10*3/uL — ABNORMAL HIGH (ref 4.0–10.5)

## 2013-12-20 MED ORDER — PANTOPRAZOLE SODIUM 40 MG PO TBEC
40.0000 mg | DELAYED_RELEASE_TABLET | Freq: Every day | ORAL | Status: DC
  Administered 2013-12-21 – 2013-12-26 (×6): 40 mg via ORAL
  Filled 2013-12-20 (×7): qty 1

## 2013-12-20 MED ORDER — POTASSIUM CHLORIDE CRYS ER 20 MEQ PO TBCR: 40.0000 meq | EXTENDED_RELEASE_TABLET | Freq: Every day | ORAL | Status: DC

## 2013-12-20 MED ORDER — HYDROCHLOROTHIAZIDE 10 MG/ML ORAL SUSPENSION
37.5000 mg | Freq: Every day | ORAL | Status: DC
  Administered 2013-12-20 – 2013-12-24 (×5): 38 mg via ORAL
  Filled 2013-12-20 (×6): qty 5

## 2013-12-20 MED ORDER — BISACODYL 10 MG RE SUPP: 10.0000 mg | Freq: Every day | RECTAL | Status: DC | PRN

## 2013-12-20 MED ORDER — HYDROCHLOROTHIAZIDE 25 MG PO TABS
37.5000 mg | ORAL_TABLET | Freq: Every day | ORAL | Status: DC
  Filled 2013-12-20: qty 1.5

## 2013-12-20 MED ORDER — POTASSIUM CHLORIDE CRYS ER 20 MEQ PO TBCR: 20.0000 meq | EXTENDED_RELEASE_TABLET | Freq: Two times a day (BID) | ORAL | Status: DC

## 2013-12-20 MED ORDER — LATANOPROST 0.005 % OP SOLN
1.0000 [drp] | Freq: Every day | OPHTHALMIC | Status: DC
  Administered 2013-12-20 – 2013-12-25 (×6): 1 [drp] via OPHTHALMIC
  Filled 2013-12-20: qty 2.5

## 2013-12-20 MED ORDER — ACETAMINOPHEN 325 MG PO TABS: 325.0000 mg | ORAL_TABLET | ORAL | Status: DC | PRN

## 2013-12-20 MED ORDER — FLEET ENEMA 7-19 GM/118ML RE ENEM: 1.0000 | ENEMA | Freq: Once | RECTAL | Status: AC | PRN

## 2013-12-20 MED ORDER — LEVOFLOXACIN 250 MG PO TABS
250.0000 mg | ORAL_TABLET | Freq: Every day | ORAL | Status: AC
  Administered 2013-12-20 – 2013-12-23 (×4): 250 mg via ORAL
  Filled 2013-12-20 (×4): qty 1

## 2013-12-20 MED ORDER — LOSARTAN POTASSIUM 50 MG PO TABS: 100.0000 mg | ORAL_TABLET | Freq: Every day | ORAL | Status: DC

## 2013-12-20 MED ORDER — PROCHLORPERAZINE EDISYLATE 5 MG/ML IJ SOLN
5.0000 mg | Freq: Four times a day (QID) | INTRAMUSCULAR | Status: DC | PRN
  Filled 2013-12-20: qty 2

## 2013-12-20 MED ORDER — ALUM & MAG HYDROXIDE-SIMETH 200-200-20 MG/5ML PO SUSP: 30.0000 mL | ORAL | Status: DC | PRN

## 2013-12-20 MED ORDER — DIPHENHYDRAMINE HCL 12.5 MG/5ML PO ELIX: 12.5000 mg | ORAL_SOLUTION | Freq: Four times a day (QID) | ORAL | Status: DC | PRN

## 2013-12-20 MED ORDER — METOPROLOL TARTRATE 50 MG PO TABS
50.0000 mg | ORAL_TABLET | Freq: Two times a day (BID) | ORAL | Status: DC
  Administered 2013-12-20 – 2013-12-26 (×11): 50 mg via ORAL
  Filled 2013-12-20 (×14): qty 1

## 2013-12-20 MED ORDER — PROCHLORPERAZINE MALEATE 5 MG PO TABS
5.0000 mg | ORAL_TABLET | Freq: Four times a day (QID) | ORAL | Status: DC | PRN
  Filled 2013-12-20: qty 2

## 2013-12-20 MED ORDER — LOSARTAN POTASSIUM 50 MG PO TABS
100.0000 mg | ORAL_TABLET | Freq: Every day | ORAL | Status: DC
  Administered 2013-12-21 – 2013-12-26 (×6): 100 mg via ORAL
  Filled 2013-12-20 (×8): qty 2

## 2013-12-20 MED ORDER — COLCHICINE 0.6 MG PO TABS
0.6000 mg | ORAL_TABLET | Freq: Two times a day (BID) | ORAL | Status: DC
Start: 2013-12-20 — End: 2013-12-26
  Administered 2013-12-20 – 2013-12-26 (×12): 0.6 mg via ORAL
  Filled 2013-12-20 (×14): qty 1

## 2013-12-20 MED ORDER — POTASSIUM CHLORIDE CRYS ER 20 MEQ PO TBCR
40.0000 meq | EXTENDED_RELEASE_TABLET | Freq: Every day | ORAL | Status: DC
  Administered 2013-12-21 – 2013-12-24 (×4): 40 meq via ORAL
  Filled 2013-12-20 (×5): qty 2

## 2013-12-20 MED ORDER — SENNOSIDES-DOCUSATE SODIUM 8.6-50 MG PO TABS
1.0000 | ORAL_TABLET | Freq: Two times a day (BID) | ORAL | Status: DC
  Administered 2013-12-20 – 2013-12-24 (×7): 1 via ORAL
  Filled 2013-12-20 (×13): qty 1

## 2013-12-20 MED ORDER — PROCHLORPERAZINE 25 MG RE SUPP
12.5000 mg | Freq: Four times a day (QID) | RECTAL | Status: DC | PRN
  Filled 2013-12-20: qty 1

## 2013-12-20 MED ORDER — GUAIFENESIN-DM 100-10 MG/5ML PO SYRP: 5.0000 mL | ORAL_SOLUTION | Freq: Four times a day (QID) | ORAL | Status: DC | PRN

## 2013-12-20 MED ORDER — TRAZODONE HCL 50 MG PO TABS: 25.0000 mg | ORAL_TABLET | Freq: Every evening | ORAL | Status: DC | PRN

## 2013-12-20 NOTE — Progress Notes (Signed)
STROKE TEAM PROGRESS NOTE   HISTORY Brian Wolf is an 80 y.o. male who was noted yesterday to by wife that he was acting strange. He was complaining of a right temporal pain when he coughed and seemed off balance. She noted he went out to the drive way and seemed to be walking to the right and then was not talking sensibly. Wife also noted some weakness in his left hand. This AM she brought him to his PCP who directed them to the ED. On arrival to ED initial CT scan showed a large right acute hemorraghic infarct. Currently patient is complaining of a slight pain on the right temple, and that he is thirsty. He will answer some questions but desires to not take part exam.   Date last known well: Date: 12/13/2013 Time last known well: Unable to determine tPA Given: No: ICH   SUBJECTIVE (INTERVAL HISTORY) His family is not  at the bedside.  Overall he feels his condition is   improving.  Remains afebrile.BP controlled. Rtght toe pain is a lot improved now on colchicineOBJECTIVE Temp:  [98.2 F (36.8 C)-99.9 F (37.7 C)] 98.2 F (36.8 C) (11/19 0944) Pulse Rate:  [71-78] 78 (11/19 0944) Cardiac Rhythm:  [-] Normal sinus rhythm (11/19 0802) Resp:  [18-20] 20 (11/19 0944) BP: (123-162)/(55-89) 127/55 mmHg (11/19 0944) SpO2:  [97 %-100 %] 97 % (11/19 0944)   Recent Labs Lab 12/17/13 1702 12/17/13 2013 12/18/13 0109 12/18/13 0835 12/18/13 1155  GLUCAP 123* 140* 122* 139* 118*    Recent Labs Lab 12/15/13 0342  12/16/13 0400  12/17/13 0545  12/17/13 1745 12/18/13 12/18/13 0635 12/19/13 0744 12/20/13 0637  NA 137  < > 139  < > 145  < > 140 137 137 130* 136*  K 3.9  --  3.4*  --  3.0*  --   --   --   --  3.0* 3.5*  CL 97  --  103  --  109  --   --   --   --  91* 94*  CO2 23  --  25  --  25  --   --   --   --  27 28  GLUCOSE 133*  --  125*  --  113*  --   --   --   --  112* 105*  BUN 25*  --  21  --  16  --   --   --   --  13 18  CREATININE 1.02  --  0.96  --  0.86  --   --    --   --  0.82 0.88  CALCIUM 8.6  --  7.4*  --  7.1*  --   --   --   --  7.7* 8.1*  < > = values in this interval not displayed.  Recent Labs Lab 12/14/13 1137 12/14/13 1550  AST 24 26  ALT 18 18  ALKPHOS 46 45  BILITOT 0.7 0.7  PROT 8.2 7.8  ALBUMIN 3.7 3.5    Recent Labs Lab 12/14/13 1137 12/14/13 1550 12/15/13 0342 12/16/13 0400 12/17/13 0545 12/20/13 0637  WBC 13.5* 16.0* 13.7* 12.8* 12.9* 13.9*  NEUTROABS 9.9* 11.2*  --   --   --   --   HGB 13.5 13.3 12.8* 10.5* 10.1* 10.9*  HCT 40.8 39.7 39.5 31.4* 31.2* 32.8*  MCV 91.3 91.1 93.6 93.2 93.1 89.4  PLT 279 PLATELETS APPEAR ADEQUATE 239 194 201 269  No results for input(s): CKTOTAL, CKMB, CKMBINDEX, TROPONINI in the last 168 hours. No results for input(s): LABPROT, INR in the last 72 hours. No results for input(s): COLORURINE, LABSPEC, [place], GLUCOSEU, HGBUR, BILIRUBINUR, KETONESUR, PROTEINUR, UROBILINOGEN, NITRITE, LEUKOCYTESUR in the last 72 hours.  Invalid input(s): APPERANCEUR     Component Value Date/Time   CHOL 147 12/15/2013 0342   TRIG 84 12/15/2013 0342   HDL 54 12/15/2013 0342   CHOLHDL 2.7 12/15/2013 0342   VLDL 17 12/15/2013 0342   LDLCALC 76 12/15/2013 0342   Lab Results  Component Value Date   HGBA1C 6.4* 12/15/2013   No results found for: LABOPIA, COCAINSCRNUR, LABBENZ, AMPHETMU, THCU, LABBARB  No results for input(s): ETH in the last 168 hours.  I have personally reviewed the radiological images below and agree with the radiology interpretations.  Dg Chest 2 View  12/14/2013   IMPRESSION: Enlargement of cardiac silhouette.  Minimal bronchitic changes and LEFT basilar atelectasis.     Ct Head Wo Contrast  12/15/13 - Evolving, relatively stable RIGHT parietal intraparenchymal hematoma with resultant 3 mm RIGHT to LEFT midline shift. Mild RIGHT lateral ventricle effacement without entrapment or hydrocephalus. Moderate to severe white matter changes suggest chronic small vessel ischemic  disease.  12/15/2013   IMPRESSION: Evolving RIGHT parietal intraparenchymal hematoma with resultant 4 mm RIGHT to LEFT midline shift. No ventricular entrapment/ hydrocephalus.  Severe white matter changes likely reflect chronic small vessel ischemic disease.      12/14/2013   IMPRESSION: Large acute hemorrhagic infarct in the posterior RIGHT parietal lobe, area of hemorrhage 5.3 x 3.3 cm with surrounding vasogenic edema and minimal RIGHT LEFT midline shift.  Underlying atrophy and small vessel chronic ischemic changes of deep cerebral white matter.     2D echo - - Left ventricle: The cavity size was normal. Wall thickness was normal. Systolic function was normal. The estimated ejection fraction was in the range of 55% to 60%. Wall motion was normal; there were no regional wall motion abnormalities. Doppler parameters are consistent with abnormal left ventricular relaxation (grade 1 diastolic dysfunction). - Aortic valve: There was mild regurgitation. - Left atrium: The atrium was moderately dilated. - Pulmonary arteries: Systolic pressure was mildly to moderately increased. PA peak pressure: 40 mm Hg (S).  Urine c/s : enterococcus sensitive to levaquin  PHYSICAL EXAM  Temp:  [98.2 F (36.8 C)-99.9 F (37.7 C)] 98.2 F (36.8 C) (11/19 0944) Pulse Rate:  [71-78] 78 (11/19 0944) Resp:  [18-20] 20 (11/19 0944) BP: (123-162)/(55-89) 127/55 mmHg (11/19 0944) SpO2:  [97 %-100 %] 97 % (11/19 0944)  General - Well nourished, well developed [place] male,  Ophthalmologic - not able to see through.  Cardiovascular - Regular rate and rhythm with no murmur. Right great toe is swollen and red at its base and tender to touch Mental Status -  Awake alert. Confused. Mildly disoriented Language including expression, naming, repetition, comprehension was assessed and found intact.  Cranial Nerves II - XII - II - left visual field deficit/neglect. III, IV, VI - Extraocular movements  intact. V - Facial sensation intact bilaterally. VII - Facial movement intact bilaterally. VIII - Hearing & vestibular intact bilaterally. X - Palate elevates symmetrically. XI - Scholle turning & shoulder shrug intact bilaterally. XII - Tongue protrusion intact.  Motor Strength - The patient's strength was normal in all extremities .no drift was present.  Bulk was normal and fasciculations were absent.   Motor Tone - Muscle tone was assessed at the neck  and appendages and was normal.  Reflexes - The patient's reflexes were normal in all extremities and he had no pathological reflexes.  Sensory - Light touch, temperature/pinprick were assessed and were normal.    Coordination - The patient had normal movements in the hands with no ataxia or dysmetria.  Tremor was absent.  Gait and Station - not tested due to labile BP.   ASSESSMENT/PLAN Mr. Monty Caliendo is a 80 y.o. male with history of HTN presenting with left side weakness, confusion and off balance with HA. He did not receive IV t-PA due to [place].    ICH:  Non-dominant right parietofrontal hemorrhage ~ 40cc, etiology unclear but likely due to HTN vs. CAA  CT showed left large frontoparietal bleeding  Repeat CT showed stable hematoma  MRI and MRA shows lobar right temporopareital hematoma with mild cytotoxic edema but no underlying tumor, amyloid angiopathy AVM or aneurysms aor vascular occlusion  2D Echo  unremarkable  LDL 76, not at goal < 70  HgbA1c 6.4, at the goal < 6.5  SCDs for VTE prophylaxis  Diet Heart   aspirin 81 mg orally every day prior to admission, now on no antithrombotics  Ongoing aggressive risk factor management  Therapy recommendations:  pending  Disposition:  Pending  Cerebral edema - repeat CT showed stable hematoma size and persistent edema but clinically patient doing better - 3 % saline now tapered off- Na today 137. -   Urinary Tract Infection   - Tmax 101.9 on 12/15/13 now  resolved - cloudy urine, UA and culture grew enterococcus - change cipro to Levaquin per pharmacy - blood culture sent out also  Hypertension  Home meds:   HCTZ, losartan and metoprolo, resume in hosptial  Unstable  BP goal < 180 Increase HCTZ to 37.5 mg daily for SBP goal below 180 Hyperlipidemia  Home meds:  none  LDL 76, not at goal < 70  Hold off statin now due to [place]  Right great toe pain/swelling- likely gout  Has responded to colchicine Hypokalemia- likely HCTZ induced. Will replace potassium Other Stroke Risk Factors  Advanced age  . Mobilize out of bed, .   Await rehab decision  Hospital day # 6    I have personally examined this patient, reviewed notes, independently viewed imaging studies, participated in medical decision making and plan of care. I have made any additions or clarifications directly to the above note. Agree with note above. I spoke personally in medical.peer to peer review with physician from [place] [facility] and recommended inpatient rehabilitation for this patient given his left-sided visual spatial neglect and need for moderate assistance with walking with poor gait as well as ongoing medical multiple problems and he agreed.  Antony Contras, MD Medical Director [facility] Pager: [phone] 12/20/2013 1:38 PM        To contact Stroke Continuity provider, please refer to [email or web address] After hours, contact General Neurology

## 2013-12-20 NOTE — H&P (Signed)
Physical Medicine and Rehabilitation Admission H&P   Chief Complaint  Patient presents with  . Problems walking, cognitive deficits as well as visual deficits.    HPI: Brian Wolf is a 80 y.o. LH- male who was noted on 12/13/13 by wife that he was acting strange. He was complaining of a right temporal pain when he coughed and seemed off balance. She noted he went out to the drive way and seemed to be walking to the right and then was not talking sensibly. Wife also noted some weakness in his left hand. On 11/13 he was sent from PCP office to ED for evaluation. On arrival to ED initial CT scan showed a large right acute hemorraghic infarct. 2D echo with EF 55-60% with no regional wall abnormality. Patient with lethargy as well as apneic episodes on 11/14 and repeat CT head was with evolving right parietal IPH with mild shift and mild right lateral ventricle effacement and was started on hypertonic saline for edema. Patient with fever due to enterobacter UTI and was started on cipro for treatment. MRI/MRA brain with Large RIGHT temporal occipital lobar hemorrhage with surrounding edema and subcentimeter of acute infarct right lateral temporal lobe. Mentation has improved and patient taken off bedrest 11/16 with initiation of therapy. He was started on regular diet and cognitive evaluation with executive deficits with complex functional tasks. He has had complaints of bilateral foot pain due to gout flare and was started colchicine yesterday. Patient with resultant ataxic gait, left inattention due to visual field deficits as well as intermittent confusion with poor safety awareness as well as lack of awareness of deficits. CIR was recommended by rehab team and MD. Work up complete and subsequently patient admitted today.   Patient denies any pains currently Denies any weakness Denies any numbness or tingling  Review of Systems  HENT: Negative for hearing loss.  Eyes: Negative for  blurred vision and double vision.  Respiratory: Negative for cough and wheezing.  Cardiovascular: Negative for chest pain and palpitations.  Gastrointestinal: Negative for heartburn, abdominal pain and constipation.  Genitourinary: Negative for dysuria and urgency.  Musculoskeletal:   Right foot discomfort  Neurological: Negative for dizziness and headaches.  Psychiatric/Behavioral: The patient has insomnia.      Past Medical History  Diagnosis Date  . Hypertension     Past Surgical History  Procedure Laterality Date  . Abdominal surgery      Family History  Problem Relation Age of Onset  . Hyperlipidemia Mother   . Hypertension Mother   . Hypertension Father     Social History: Married. 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 He reports that he has never smoked. He has never used smokeless tobacco. He reports that he does not drink alcohol. His drug history is not on file.    Allergies: No Known Allergies    Medications Prior to Admission  Medication Sig Dispense Refill  . aspirin EC 81 MG tablet Take 81 mg by mouth daily.    . hydrochlorothiazide (HYDRODIURIL) 25 MG tablet Take 25 mg by mouth daily.    Marland Kitchen losartan (COZAAR) 100 MG tablet Take 100 mg by mouth daily.    . metoprolol (LOPRESSOR) 50 MG tablet Take 50 mg by mouth 2 (two) times daily.    . simvastatin (ZOCOR) 40 MG tablet Take 40 mg by mouth daily.    . Travoprost, BAK Free, (TRAVATAN) 0.004 % SOLN ophthalmic solution Place 1 drop into both eyes at  bedtime.    . chlorpheniramine-HYDROcodone (TUSSIONEX PENNKINETIC ER) 10-8 MG/5ML LQCR Take 5 mLs by mouth every 12 (twelve) hours as needed for cough. (Patient not taking: Reported on 12/14/2013) 115 mL 1    Home: Home Living Family/patient expects to be discharged to:: Private residence Living Arrangements: Spouse/significant other Available Help at  Discharge: Family, Available 24 hours/day Type of Home: House Home Access: Stairs to enter [employer] of Steps: 16 with rail on right (from basement garage); 3 no rail brick steps--grass area with stepping stones--steps with rail on right (from driveway).  Entrance Stairs-Rails: (see comments # steps) Home Layout: Two level, Laundry or work area in basement (garage entrance via basement (or can use main floor entrance) Alternate Level Stairs-Number of Steps: See entrance stairs above Home Equipment: None Lives With: Spouse  Functional History: Prior Function Level of Independence: Independent Comments: acrylic painting--hobby  Functional Status:  Mobility: Bed Mobility Overal bed mobility: Needs Assistance Bed Mobility: Rolling Rolling: Min assist Supine to sit: Min assist General bed mobility comments: A to ensure balance and control with sitting EOB. A for trunk support into sitting. Use of rails Transfers Overall transfer level: Needs assistance Equipment used: 2 person hand held assist Transfers: Sit to/from Stand Sit to Stand: Mod assist, +2 physical assistance Stand pivot transfers: Mod assist, +2 physical assistance General transfer comment: Increased assistance required as patient hesitant with movement due to increase foot pain. Cues for sfae techique and positioning. Increased time. Patient required increased time for small steps.  Ambulation/Gait Ambulation/Gait assistance: Mod assist, +2 physical assistance, +2 safety/equipment Ambulation Distance (Feet): 110 Feet Assistive device: 2 person hand held assist, 1 person hand held assist Gait Pattern/deviations: Step-through pattern, Wide base of support, Antalgic (excessive lateral flexion Rt and Lt) Gait velocity: decr with little ability to ince General Gait Details: unable to attempt due to foot pain and patient refusing to attempt    ADL: ADL Overall ADL's : Needs  assistance/impaired Eating/Feeding: Supervision/ safety Grooming: Supervision/safety, Set up, Cueing for compensatory techniques Upper Body Bathing: Minimal assitance, Sitting Lower Body Bathing: Set up, Supervison/ safety, Sit to/from stand (to look left) Upper Body Dressing : Moderate assistance, Sitting Lower Body Dressing: Set up, Supervision/safety, Sit to/from stand (to look left) Toilet Transfer: Moderate assistance, Ambulation, Comfort height toilet, +2 for physical assistance, +2 for safety/equipment Toileting- Clothing Manipulation and Hygiene: Maximal assistance, Sit to/from stand General ADL Comments: apparent decline in function since intitial eval.  Cognition: Cognition Overall Cognitive Status: Impaired/Different from baseline Arousal/Alertness: Awake/alert Orientation Level: Oriented to person, Oriented to time, Oriented to place, Disoriented to situation Attention: Focused, Sustained, Selective, Alternating Focused Attention: Appears intact Sustained Attention: Appears intact Selective Attention: Appears intact Alternating Attention: Appears intact Memory: Appears intact Awareness: Impaired Awareness Impairment: Emergent impairment, Anticipatory impairment Problem Solving: Impaired Problem Solving Impairment: Functional complex, Verbal complex Executive Function: Organizing, Sequencing, Reasoning Reasoning: Impaired Reasoning Impairment: Verbal complex, Functional complex Sequencing: Impaired Sequencing Impairment: Functional complex Organizing: Impaired Organizing Impairment: Functional complex Behaviors: Impulsive Safety/Judgment: Impaired Cognition Arousal/Alertness: Awake/alert Behavior During Therapy: Restless, Impulsive Overall Cognitive Status: Impaired/Different from baseline Area of Impairment: Attention, Memory, Following commands, Safety/judgement, Awareness, Problem solving Current Attention Level: Selective Following Commands: Follows one step  commands with increased time Safety/Judgement: Decreased awareness of safety, Decreased awareness of deficits Awareness: Intellectual Problem Solving: Slow processing, Decreased initiation, Difficulty sequencing, Requires verbal cues, Requires tactile cues General Comments: Pt required Mod A +2 with mobility and was unable to tourn to sito ntoilet and states  that he could go home saefly alone  Physical Exam: Blood pressure 127/55, pulse 78, temperature 98.2 F (36.8 C), temperature source Oral, resp. rate 20, height _0  (1.651 m), weight 77.3 kg (170 lb 6.7 oz), SpO2 97 %. Physical Exam  Nursing note and vitals reviewed. Constitutional: He is oriented to person, place, and time. He appears well-developed and well-nourished.  HENT:  Head: Normocephalic and atraumatic.  Eyes: Conjunctivae are normal. Pupils are equal, round, and reactive to light.  Neck: Normal range of motion. Neck supple.  Cardiovascular: Normal rate and regular rhythm.  Respiratory: Effort normal and breath sounds normal. No respiratory distress. He has no wheezes.  GI: Soft. Bowel sounds are normal.  Musculoskeletal:  Right forefoot tender to touch with mild erythema. Left great toe with minimal erythema--not tender to touch today.  Neurological: He is alert and oriented to person, place, and time.  Speech clear. Interactive and appropriate today. Follows commands without difficulty. Mild left inattention. Moves all four without difficulty. Has improved awareness and asked appropriated questions re: rehab/LOS.  Skin: Skin is warm and dry.  Psychiatric: He has a normal mood and affect. His behavior is normal. Thought content normal.  Motor strength is 5/5 bilateral deltoid, biceps, triceps, grip, hip flexors, knee extensors, ankle dorsi flexors and plantar flexors    Lab Results Last 48 Hours    Results for orders placed or performed during the hospital encounter of 12/14/13 (from the past 48 hour(s))  Basic  metabolic panel Status: Abnormal   Collection Time: 12/19/13 7:44 AM  Result Value Ref Range   Sodium 130 (L) 137 - 147 mEq/L    Comment: DELTA CHECK NOTED   Potassium 3.0 (L) 3.7 - 5.3 mEq/L    Comment: HEMOLYSIS AT THIS LEVEL MAY AFFECT RESULT   Chloride 91 (L) 96 - 112 mEq/L   CO2 27 19 - 32 mEq/L   Glucose, Bld 112 (H) 70 - 99 mg/dL   BUN 13 6 - 23 mg/dL   Creatinine, Ser 0.82 0.50 - 1.35 mg/dL   Calcium 7.7 (L) 8.4 - 10.5 mg/dL   GFR calc non Af Amer 81 (L) >90 mL/min   GFR calc Af Amer >90 >90 mL/min    Comment: (NOTE) The eGFR has been calculated using the CKD EPI equation. This calculation has not been validated in all clinical situations. eGFR's persistently <90 mL/min signify possible Chronic Kidney Disease.    Anion gap 12 5 - 15  CBC Status: Abnormal   Collection Time: 12/20/13 6:37 AM  Result Value Ref Range   WBC 13.9 (H) 4.0 - 10.5 K/uL   RBC 3.67 (L) 4.22 - 5.81 MIL/uL   Hemoglobin 10.9 (L) 13.0 - 17.0 g/dL   HCT 32.8 (L) 39.0 - 52.0 %   MCV 89.4 78.0 - 100.0 fL   MCH 29.7 26.0 - 34.0 pg   MCHC 33.2 30.0 - 36.0 g/dL   RDW 13.4 11.5 - 15.5 %   Platelets 269 150 - 400 K/uL  Basic metabolic panel Status: Abnormal   Collection Time: 12/20/13 6:37 AM  Result Value Ref Range   Sodium 136 (L) 137 - 147 mEq/L   Potassium 3.5 (L) 3.7 - 5.3 mEq/L   Chloride 94 (L) 96 - 112 mEq/L   CO2 28 19 - 32 mEq/L   Glucose, Bld 105 (H) 70 - 99 mg/dL   BUN 18 6 - 23 mg/dL   Creatinine, Ser 0.88 0.50 - 1.35 mg/dL   Calcium 8.1 (L)  8.4 - 10.5 mg/dL   GFR calc non Af Amer 79 (L) >90 mL/min   GFR calc Af Amer >90 >90 mL/min    Comment: (NOTE) The eGFR has been calculated using the CKD EPI equation. This calculation has not been validated in all clinical situations. eGFR's persistently <90 mL/min signify possible  Chronic Kidney Disease.    Anion gap 14 5 - 15      Imaging Results (Last 48 hours)    No results found.       Medical Problem List and Plan: 1. Functional deficits secondary to Right temporal -occipital hemorrhagic infarct 2. DVT Prophylaxis/Anticoagulation: Mechanical:  Antiembolism stockings, knee (TED hose) Bilateral lower extremities Sequential compression devices, below knee Bilateral lower extremities 3. Pain Management: tylenol prn for headaches as well as foot pain.  4. Mood: Team to provide ego support and reinforcement. LCSW to follow for evaluation and support.  5. Neuropsych: This patient is is capable of making decisions on his own behalf. 6. Skin/Wound Care: Routine  7. Fluids/Electrolytes/Nutrition: Monitor I/O. Family providing food from home to help with intake. Offer supplements as needed.  8. HTN: Will monitor every 8 hours. Avoid hypotension to allow adequate perfusion BP goal< 180. Continue HCTZ, Metoprolol and Cozaar.  9. Enterococcus UTI: Antibiotic changed to Levaquin D # 4/7 10. Hypokalemia: Due to diuretics as well as IVF. Continue supplement with caution as on Cozaar. Will recheck lytes in am.  11. Gout flare: Continue colchicine bid.  12. Reactive leucocytosis: Likely due to UTI. Resolving.  13. ABLA: Has had a drop in H/H from 13.5-->10.5. Will monitor for H/H with serial check as well as for any occult signs of bleeding. Check stool guaiacs.    Post Admission Physician Evaluation: Functional deficits secondary to  Right temporal -occipital hemorrhagic infarct 1. . 2. Patient is admitted to receive collaborative, interdisciplinary care between the physiatrist, rehab nursing staff, and therapy team. 3. Patient's level of medical complexity and substantial therapy needs in context of that medical necessity cannot be provided at a lesser intensity of care such as a SNF. 4. Patient has experienced substantial functional loss from his/her  baseline which was documented above under the "Functional History" and "Functional Status" headings. Judging by the patient's diagnosis, physical exam, and functional history, the patient has potential for functional progress which will result in measurable gains while on inpatient rehab. These gains will be of substantial and practical use upon discharge in facilitating mobility and self-care at the household level. 5. Physiatrist will provide 24 hour management of medical needs as well as oversight of the therapy plan/treatment and provide guidance as appropriate regarding the interaction of the two. 6. 24 hour rehab nursing will assist with bladder management, bowel management, safety, skin/wound care, disease management, medication administration, pain management and patient education and help integrate therapy concepts, techniques,education, etc. 7. PT will assess and treat for/with: pre gait, gait training, endurance , safety, equipment, neuromuscular re education. Goals are: Sup. 8. OT will assess and treat for/with: ADLs, Cognitive perceptual skills, Neuromuscular re education, safety, endurance, equipment. Goals are: Sup. Therapy may proceed with showering this patient. 9. SLP will assess and treat for/with: cognitive eval. Goals are: Mod I med management. 10. Case Management and Social Worker will assess and treat for psychological issues and discharge planning. 11. Team conference will be held weekly to assess progress toward goals and to determine barriers to discharge. 12. Patient will receive at least 3 hours of therapy per day at least 5  days per week. 13. ELOS:6-9d  14. Prognosis: excellent     Charlett Blake M.D. [facility] FAAPM&R (Sports Med, Neuromuscular Med) Diplomate Am Board of Electrodiagnostic Med  12/20/2013

## 2013-12-20 NOTE — Progress Notes (Signed)
I await insurance approval to admit pt to inpt rehab. [phone]

## 2013-12-20 NOTE — Progress Notes (Signed)
Speech Language Pathology Treatment: Cognitive-Linquistic  Patient Details Name: Brian Wolf MRN: [id number] DOB: 27-Jul-1931 Today's Date: 12/20/2013 Time: [id number] SLP Time Calculation (min) (ACUTE ONLY): 19 min  Assessment / Plan / Recommendation Clinical Impression  Pt was agreeable to participate in cognitive treatment with Min encouragement. Pt required Mod cues provided by SLP for basic problem solving, scanning, and comprehension to read headlines on food menu. Pt wrote his name and address with repetitions, omissions, and spelling errors noted. SLP provided Mod cues for emergent awareness of errors, with patient demonstrating limited intellectual awareness of deficits s/p hemorraghic infarct.    HPI HPI: 80 year old male admitted 12/14/13 due to AMS. PMH significant for HTN. CT revealed right parietal hemorragic infarct. CXR shows Cardiomegaly with mild pulmonary vascular congestion. SLE ordered to evaluate cognitive-linguistic functioning.   Pertinent Vitals Pain Assessment: No/denies pain Faces Pain Scale: Hurts little more Pain Location: toe Pain Descriptors / Indicators: Grimacing Pain Intervention(s): Monitored during session  SLP Plan  Continue with current plan of care    Recommendations                Oral Care Recommendations: Oral care BID Follow up Recommendations: Inpatient Rehab Plan: Continue with current plan of care    GO      Germain Osgood, M.A. CCC-SLP [phone]  Germain Osgood 12/20/2013, 3:14 PM

## 2013-12-20 NOTE — Progress Notes (Signed)
Physical Medicine and Rehabilitation Consult  Reason for Consult: Problems walking, cognitive deficits as well as visual deficits.  Referring Physician: Dr. Leonie Man.    HPI: Brian Wolf is a 80 y.o. LH- male who was noted on 12/13/13 by wife that he was acting strange. He was complaining of a right temporal pain when he coughed and seemed off balance. She noted he went out to the drive way and seemed to be walking to the right and then was not talking sensibly. Wife also noted some weakness in his left hand. On 11/13 he was sent from PCP office to ED for evaluation. On arrival to ED initial CT scan showed a large right acute hemorraghic infarct. 2D echo with EF 55-60% with no regional wall abnormality. Patient with lethargy as well as apneic episodes on 11/14 and repeat CT head was with evolving right parietal IPH with mild shift and mild right lateral ventricle effacement and was started on hypertonic saline for edema. Patient with fever due to enterobacter UTI and was started on cipro for treatment. MRI/MRA brain with Large RIGHT temporal occipital lobar hemorrhage with surrounding edema and subcentimeter of acute infarct right lateral temporal lobe. Mentation has improved and patient taken off bedrest 11/16 with initiation of therapy. He was started on regular diet and cognitive evaluation with executive deficits with complex functional tasks. Patient with resultant ataxic gait, left inattention due to visual field deficits as well as intermittent confusion with poor safety awareness. CIR recommended by MD and rehab team.    Review of Systems  Unable to perform ROS: other  HENT: Negative for hearing loss.  Respiratory: Negative for cough and shortness of breath.  Cardiovascular: Negative for chest pain.  Musculoskeletal: Negative for myalgias, back pain and joint pain.  Neurological: Negative for dizziness and headaches.   Past Medical History  Diagnosis Date  . Hypertension     Past Surgical History  Procedure Laterality Date  . Abdominal surgery     Family History  Problem Relation Age of Onset  . Hyperlipidemia Mother   . Hypertension Mother   . Hypertension Father    Social History: Married. Works as an Training and development officer. He reports that he has never smoked. He has never used smokeless tobacco. He reports that he does not drink alcohol. His drug history is not on file.   Allergies: No Known Allergies   Medications Prior to Admission  Medication Sig Dispense Refill  . aspirin EC 81 MG tablet Take 81 mg by mouth daily.    . hydrochlorothiazide (HYDRODIURIL) 25 MG tablet Take 25 mg by mouth daily.    Marland Kitchen losartan (COZAAR) 100 MG tablet Take 100 mg by mouth daily.    . metoprolol (LOPRESSOR) 50 MG tablet Take 50 mg by mouth 2 (two) times daily.    . simvastatin (ZOCOR) 40 MG tablet Take 40 mg by mouth daily.    . Travoprost, BAK Free, (TRAVATAN) 0.004 % SOLN ophthalmic solution Place 1 drop into both eyes at bedtime.    . chlorpheniramine-HYDROcodone (TUSSIONEX PENNKINETIC ER) 10-8 MG/5ML LQCR Take 5 mLs by mouth every 12 (twelve) hours as needed for cough. (Patient not taking: Reported on 12/14/2013) 115 mL 1    Home: Home Living Family/patient expects to be discharged to:: Private residence Living Arrangements: Spouse/significant other Available Help at Discharge: Family, Available 24 hours/day Type of Home: House Home Access: Stairs to enter [employer] of Steps: 16 with rail on right (from basement garage); 3 no rail brick steps--grass area  with stepping stones--steps with rail on right (from driveway). of note son is having rails put up on both sides of all stairs with therapy giving input Entrance Stairs-Rails: (see comments # steps) Home Layout: Two level, Laundry or work area in basement (garage entrance via basement (or can use main floor entrance) Alternate Level  Stairs-Number of Steps: See entrance stairs above Home Equipment: None Lives With: Spouse  Functional History: Prior Function Level of Independence: Independent Comments: acrylic painting--hobby Functional Status:  Mobility: Bed Mobility Overal bed mobility: Needs Assistance Bed Mobility: Supine to Sit Supine to sit: Min assist General bed mobility comments: cues for encouragement and not gettign too close to the EOB.  Transfers Overall transfer level: Needs assistance Equipment used: 2 person hand held assist Transfers: Sit to/from Stand, Stand Pivot Transfers Sit to Stand: Min assist, +2 physical assistance Stand pivot transfers: Mod assist, +2 physical assistance General transfer comment: pt maintains WOB and needs cueing for not sitting prematurely. pt indicates pain in toes on Bil feet limiting mobility today.  Ambulation/Gait Ambulation/Gait assistance: Mod assist, +2 physical assistance, +2 safety/equipment Ambulation Distance (Feet): 110 Feet Assistive device: 2 person hand held assist, 1 person hand held assist Gait Pattern/deviations: Step-through pattern, Wide base of support, Antalgic (excessive lateral flexion Rt and Lt) Gait velocity: decr with little ability to ince General Gait Details: pt reported tightness in his feet (?arches) immediately upon standing and noted he has not stood x 3 day. Pt with antalgic pattern requiring 2 person assist initially with progression to 1 person    ADL: ADL Overall ADL's : Needs assistance/impaired Eating/Feeding: Supervision/ safety, Cueing for compensatory techinques (to look left to find all items on his tray) Grooming: Supervision/safety, Set up, Cueing for compensatory techniques (to look left) Upper Body Bathing: Supervision/ safety, Set up (to look left) Lower Body Bathing: Set up, Supervison/ safety, Sit to/from stand (to look left) Upper Body Dressing : Set up, Supervision/safety (to look left) Lower Body Dressing:  Set up, Supervision/safety, Sit to/from stand (to look left) Toilet Transfer: Minimal assistance Toileting- Clothing Manipulation and Hygiene: Supervision/safety, Sit to/from stand  Cognition: Cognition Overall Cognitive Status: Impaired/Different from baseline Arousal/Alertness: Awake/alert Orientation Level: Oriented to person, Oriented to time, Oriented to place, Disoriented to situation Attention: Focused, Sustained, Selective, Alternating Focused Attention: Appears intact Sustained Attention: Appears intact Selective Attention: Appears intact Alternating Attention: Appears intact Memory: Appears intact Awareness: Impaired Awareness Impairment: Emergent impairment, Anticipatory impairment Problem Solving: Impaired Problem Solving Impairment: Functional complex, Verbal complex Executive Function: Organizing, Sequencing, Reasoning Reasoning: Impaired Reasoning Impairment: Verbal complex, Functional complex Sequencing: Impaired Sequencing Impairment: Functional complex Organizing: Impaired Organizing Impairment: Functional complex Behaviors: Impulsive Safety/Judgment: Impaired Cognition Arousal/Alertness: Awake/alert Behavior During Therapy: Restless Overall Cognitive Status: Impaired/Different from baseline Area of Impairment: Attention Current Attention Level: Sustained (internally distracted) Safety/Judgement: Decreased awareness of safety, Decreased awareness of deficits Problem Solving: Slow processing General Comments: difficult to assess due to cooperation this am. Pt perseverating on wanting to eat  Blood pressure 163/73, pulse 85, temperature 98.9 F (37.2 C), temperature source Oral, resp. rate 26, height 5\' 5"  (1.651 m), weight 77.3 kg (170 lb 6.7 oz), SpO2 98 %. Physical Exam  Nursing note and vitals reviewed. Constitutional: He appears well-developed and well-nourished.  HENT:  Head: Normocephalic and atraumatic.  Eyes: Conjunctivae are normal. Pupils are  equal, round, and reactive to light.  Neck: Normal range of motion. Neck supple.  Cardiovascular: Normal rate and regular rhythm. Exam reveals no gallop.  Respiratory: Effort normal  and breath sounds normal. No respiratory distress. He has no wheezes.  GI: Soft. Bowel sounds are normal.  Musculoskeletal: He exhibits no edema.  Erythema with tenderness right forefoot/right great toe.  Neurological: He is alert.  Oriented to self, place, date, year. Poor frustration tolerance requiring coaxing to participate in exam. Perseverative behaviors needing redirection. Speech clear. Moves all four. Able to follow simple one step commands and needs cues for 2 step directions. Has poor insight and lacks awareness of deficits. Mild left inattention, processing delays. Strength nearly 4+/5 LUE and LLE. Mild decrease in [facility] on left. Senses pain,LT on left side. No gross visual cuts Skin: Skin is warm and dry.  Psychiatric: He has a normal mood and affect. His behavior is normal.     Lab Results Last 24 Hours    Results for orders placed or performed during the hospital encounter of 12/14/13 (from the past 24 hour(s))  Glucose, capillary Status: Abnormal   Collection Time: 12/17/13 5:02 PM  Result Value Ref Range   Glucose-Capillary 123 (H) 70 - 99 mg/dL  Sodium Status: None   Collection Time: 12/17/13 5:45 PM  Result Value Ref Range   Sodium 140 137 - 147 mEq/L  Glucose, capillary Status: Abnormal   Collection Time: 12/17/13 8:13 PM  Result Value Ref Range   Glucose-Capillary 140 (H) 70 - 99 mg/dL  Sodium Status: None   Collection Time: 12/18/13 12:00 AM  Result Value Ref Range   Sodium 137 137 - 147 mEq/L  Glucose, capillary Status: Abnormal   Collection Time: 12/18/13 1:09 AM  Result Value Ref Range   Glucose-Capillary 122 (H) 70 - 99 mg/dL  Sodium Status: None   Collection Time: 12/18/13 6:35 AM  Result  Value Ref Range   Sodium 137 137 - 147 mEq/L  Glucose, capillary Status: Abnormal   Collection Time: 12/18/13 8:35 AM  Result Value Ref Range   Glucose-Capillary 139 (H) 70 - 99 mg/dL   Comment 1 Notify RN    Comment 2 Documented in Chart   Glucose, capillary Status: Abnormal   Collection Time: 12/18/13 11:55 AM  Result Value Ref Range   Glucose-Capillary 118 (H) 70 - 99 mg/dL   Comment 1 Notify RN    Comment 2 Documented in Chart       Imaging Results (Last 48 hours)    Mr Jodene Nam Head Wo Contrast  12/17/2013 CLINICAL DATA: Hypertension, with a large lobar hemorrhage of uncertain etiology. LEFT hemiparesis. EXAM: MRI HEAD WITHOUT CONTRAST MRA HEAD WITHOUT CONTRAST TECHNIQUE: Multiplanar, multiecho pulse sequences of the brain and surrounding structures were obtained without intravenous contrast. Angiographic images of the head were obtained using MRA technique without contrast. COMPARISON: None. FINDINGS: MRI HEAD FINDINGS There is an acute RIGHT temporo-occipital lobar hemorrhage with surrounding edema. This measures 29 x 48 x 34 mm corresponding to an approximate volume of 21 mL. Probable trace subarachnoid and intraventricular blood. Tiny focus of restricted diffusion RIGHT lateral temporal lobe as seen on image 14 series 4 could represent a small acute infarct. No obvious associated mass to suggest a hemorrhagic metastasis. Slight midline shift of the septum pellucidum right-to-left of 3 mm, stable. Generalized atrophy. Moderate T2 and FLAIR hyperintensities suggesting chronic microvascular ischemic change. No midline abnormality. Extracranial soft tissues grossly unremarkable. MRA HEAD FINDINGS The internal carotid arteries are widely patent. The basilar artery is widely patent with vertebrals both contributing. No proximal MCA or PCA stenosis. No intracranial aneurysm. Azygos LEFT A1 ACA. No cerebellar branch occlusion.  IMPRESSION:  Large RIGHT temporal occipital lobar hemorrhage with surrounding edema. Approximate volume 21 mL. Probable trace subarachnoid and intraventricular blood. Probable lobar hypertensive bleed. Cerebral amyloid angiopathy not excluded. No obvious underlying mass or vascular malformation. Subcentimeter focus of acute infarction RIGHT lateral temporal lobe, remote from the hemorrhage. No intracranial flow limiting stenosis or vascular malformation. Electronically Signed By: Rolla Flatten M.D. On: 12/17/2013 16:59   Mr Brain Wo Contrast  12/17/2013 CLINICAL DATA: Hypertension, with a large lobar hemorrhage of uncertain etiology. LEFT hemiparesis. EXAM: MRI HEAD WITHOUT CONTRAST MRA HEAD WITHOUT CONTRAST TECHNIQUE: Multiplanar, multiecho pulse sequences of the brain and surrounding structures were obtained without intravenous contrast. Angiographic images of the head were obtained using MRA technique without contrast. COMPARISON: None. FINDINGS: MRI HEAD FINDINGS There is an acute RIGHT temporo-occipital lobar hemorrhage with surrounding edema. This measures 29 x 48 x 34 mm corresponding to an approximate volume of 21 mL. Probable trace subarachnoid and intraventricular blood. Tiny focus of restricted diffusion RIGHT lateral temporal lobe as seen on image 14 series 4 could represent a small acute infarct. No obvious associated mass to suggest a hemorrhagic metastasis. Slight midline shift of the septum pellucidum right-to-left of 3 mm, stable. Generalized atrophy. Moderate T2 and FLAIR hyperintensities suggesting chronic microvascular ischemic change. No midline abnormality. Extracranial soft tissues grossly unremarkable. MRA HEAD FINDINGS The internal carotid arteries are widely patent. The basilar artery is widely patent with vertebrals both contributing. No proximal MCA or PCA stenosis. No intracranial aneurysm. Azygos LEFT A1 ACA. No cerebellar branch occlusion. IMPRESSION: Large RIGHT temporal  occipital lobar hemorrhage with surrounding edema. Approximate volume 21 mL. Probable trace subarachnoid and intraventricular blood. Probable lobar hypertensive bleed. Cerebral amyloid angiopathy not excluded. No obvious underlying mass or vascular malformation. Subcentimeter focus of acute infarction RIGHT lateral temporal lobe, remote from the hemorrhage. No intracranial flow limiting stenosis or vascular malformation. Electronically Signed By: Rolla Flatten M.D. On: 12/17/2013 16:59   Dg Chest Port 1 View  12/16/2013 CLINICAL DATA: Confusion and fever EXAM: PORTABLE CHEST - 1 VIEW COMPARISON: 12/15/2013 and prior radiographs dating back to 03/04/2013 FINDINGS: Cardiomegaly and mild pulmonary vascular congestion noted. A left IJ central venous catheter with tip overlying the lower SVC is noted. There is no evidence of focal airspace disease, pulmonary edema, suspicious pulmonary nodule/mass, pleural effusion, or pneumothorax. No acute bony abnormalities are identified. IMPRESSION: Cardiomegaly with mild pulmonary vascular congestion. Electronically Signed By: Hassan Rowan M.D. On: 12/16/2013 14:56     Assessment/Plan: Diagnosis: Right temporal -occipital hemorrhagic infarct 1. Does the need for close, 24 hr/day medical supervision in concert with the patient's rehab needs make it unreasonable for this patient to be served in a less intensive setting? Yes 2. Co-Morbidities requiring supervision/potential complications: htn 3. Due to bladder management, bowel management, safety, skin/wound care, disease management, medication administration, pain management and patient education, does the patient require 24 hr/day rehab nursing? Yes 4. Does the patient require coordinated care of a physician, rehab nurse, PT (1-2 hrs/day, 5 days/week), OT (1=-2 hrs/day, 5 days/week) and SLP (1=2 hrs/day, 5 days/week) to address physical and functional deficits in the context of the above  medical diagnosis(es)? Yes Addressing deficits in the following areas: balance, endurance, locomotion, strength, transferring, bowel/bladder control, bathing, dressing, feeding, grooming, toileting, cognition and psychosocial support 5. Can the patient actively participate in an intensive therapy program of at least 3 hrs of therapy per day at least 5 days per week? Yes 6. The potential for patient to make measurable gains  while on inpatient rehab is excellent 7. Anticipated functional outcomes upon discharge from inpatient rehab are supervision with PT, supervision with OT, supervision with SLP. 8. Estimated rehab length of stay to reach the above functional goals is: 7-10 days 9. Does the patient have adequate social supports and living environment to accommodate these discharge functional goals? Yes 10. Anticipated D/C setting: Home 11. Anticipated post D/C treatments: HH therapy and Outpatient therapy 12. Overall Rehab/Functional Prognosis: excellent  RECOMMENDATIONS: This patient's condition is appropriate for continued rehabilitative care in the following setting: CIR Patient has agreed to participate in recommended program. Yes Note that insurance prior authorization may be required for reimbursement for recommended care.  Comment: Pt was active PTA and motivated to recover. Family supportive. Rehab Admissions Coordinator to follow up.  Thanks,  Meredith Staggers, MD, [facility]     12/18/2013       Revision History     Date/Time User Provider Type Action   12/18/2013 2:32 PM Meredith Staggers, MD Physician Sign   12/18/2013 2:20 PM Meredith Staggers, MD Physician Share   12/18/2013 2:13 PM Meredith Staggers, MD Physician Share   12/18/2013 1:14 PM Bary Leriche, PA-C Physician Assistant Share   View Details Report       Routing History     Date/Time From To Method   12/18/2013 2:32 PM Meredith Staggers, MD Meredith Staggers, MD In Basket   12/18/2013 2:32 PM  Meredith Staggers, MD Gaynelle Arabian, MD Fax

## 2013-12-20 NOTE — Discharge Summary (Signed)
Physician Discharge Summary  Patient ID: Brian Wolf MRN: [id number] DOB/AGE: 78-Mar-1933 78 y.o.  Admit date: 12/14/2013 Discharge date: 12/20/2013  Admission Diagnoses:Intracerebral hemorrhage  Discharge Diagnoses: Non-dominant right parietofrontal hemorrhage ~ 40cc, etiology unclear but likely due to HTN vs. CAA Active Problems:   ICH (intracerebral hemorrhage) Accelerated Hypertension   Encounter for central line placement   Encephalopathy acute   Altered mental status   Hypokalemia   Hemorrhagic stroke  Acute gout right great toe Hypokalemia Urinary Tract Infection  Discharged Condition: fair  Hospital Course: Brian Wolf is an 80 y.o. male who was noted yesterday to by wife that he was acting strange. He was complaining of a right temporal pain when he coughed and seemed off balance. She noted he went out to the drive way and seemed to be walking to the right and then was not talking sensibly. Wife also noted some weakness in his left hand. This AM she brought him to his PCP who directed them to the ED. On arrival to ED initial CT scan showed a large right acute hemorraghic infarct. Currently patient is complaining of a slight pain on the right temple, and that he is thirsty. He will answer some questions but desires to not take part exam.  Date last known well: Date: 12/13/2013 Time last known well: Unable to determine tPA Given: No: [place] Patient was kept in intensive care unit where blood pressure was tightly controlled. He was initially slightly confused and had left-sided visual field defect and neglect. He had some mild initial left arm and leg weakness but that quickly resolved. Follow-up brain imaging showed stable appearance of the hematoma. MRI and MRA shows lobar right temporopareital hematoma with mild cytotoxic edema but no underlying tumor, amyloid angiopathy AVM or aneurysms.Transthoracic echo and lipid profile were normal. Hemoglobin A1c was 6.4. Patient  had previously been on aspirin which was held during the hospitalization. His condition remained stable and he was transferred to the floor where he made gradual improvement. He still had persistent left-sided visual field deficits and some mild left-sided visual neglect and spatial difficulties. He required moderate assistance to walk with the physical therapist and was thus not considered safe. He was evaluated by the inpatient rehabilitation team and felt to be a good candidate. He was initially turned down by [facility] but after peer to peer review and  Telephonic discussion with physician from [facility] he was approved to be transferred to inpatient rehabilitation. I met personally lungs multiple occasions with the patient's son [name] who is emergency medicine physician in [facility] who agreed with this plan. Patient's blood pressure remained difficult to control initially and his home blood pressure medications had to increase. He developed mild hypokalemia which was corrected with potassium supplementation. he developed pain and swelling in the right great toe which was felt to be due to gout and it responded to colchicine which was continued at the time of discharge. He was also found to have urinary tract infection and urine culture grew enterococcus which was sensitive to Levaquin which was continued at the time of discharge. Consults: rehabilitation medicine  Significant Diagnostic Studies: Dg Chest 2 View  12/14/2013 IMPRESSION: Enlargement of cardiac silhouette. Minimal bronchitic changes and LEFT basilar atelectasis.   Ct Head Wo Contrast  12/15/13 - Evolving, relatively stable RIGHT parietal intraparenchymal hematoma with resultant 3 mm RIGHT to LEFT midline shift. Mild RIGHT lateral ventricle effacement without entrapment or hydrocephalus. Moderate to severe white matter changes suggest  chronic small vessel ischemic disease.  12/15/2013 IMPRESSION:  Evolving RIGHT parietal intraparenchymal hematoma with resultant 4 mm RIGHT to LEFT midline shift. No ventricular entrapment/ hydrocephalus. Severe white matter changes likely reflect chronic small vessel ischemic disease.   12/14/2013 IMPRESSION: Large acute hemorrhagic infarct in the posterior RIGHT parietal lobe, area of hemorrhage 5.3 x 3.3 cm with surrounding vasogenic edema and minimal RIGHT LEFT midline shift. Underlying atrophy and small vessel chronic ischemic changes of deep cerebral white matter.   2D echo - - Left ventricle: The cavity size was normal. Wall thickness was normal. Systolic function was normal. The estimated ejection fraction was in the range of 55% to 60%. Wall motion was normal; there were no regional wall motion abnormalities. Doppler parameters are consistent with abnormal left ventricular relaxation (grade 1 diastolic dysfunction). - Aortic valve: There was mild regurgitation. - Left atrium: The atrium was moderately dilated. - Pulmonary arteries: Systolic pressure was mildly to moderately increased. PA peak pressure: 40 mm Hg (S).  Urine c/s : enterococcus sensitive to levaquin    Discharge Exam: Blood pressure 132/58, pulse 73, temperature 99.6 F (37.6 C), temperature source Oral, resp. rate 16, height '5\' 5"'  (1.651 m), weight 170 lb 6.7 oz (77.3 kg), SpO2 100 %. Temp: [98.2 F (36.8 C)-99.9 F (37.7 C)] 98.2 F (36.8 C) (11/19 0944) Pulse Rate: [71-78] 78 (11/19 0944) Resp: [18-20] 20 (11/19 0944) BP: (123-162)/(55-89) 127/55 mmHg (11/19 0944) SpO2: [97 %-100 %] 97 % (11/19 0944)  General - Well nourished, well developed [place] male,  Ophthalmologic - not able to see through.  Cardiovascular - Regular rate and rhythm with no murmur. Right great toe is swollen and red at its base and tender to touch Mental Status -  Awake alert. Confused. Mildly disoriented Language including expression, naming, repetition,  comprehension was assessed and found intact.  Cranial Nerves II - XII - II - left visual field deficit/neglect. III, IV, VI - Extraocular movements intact. V - Facial sensation intact bilaterally. VII - Facial movement intact bilaterally. VIII - Hearing & vestibular intact bilaterally. X - Palate elevates symmetrically. XI - Masi turning & shoulder shrug intact bilaterally. XII - Tongue protrusion intact.  Motor Strength - The patient's strength was normal in all extremities .no drift was present. Bulk was normal and fasciculations were absent.  Motor Tone - Muscle tone was assessed at the neck and appendages and was normal.  Reflexes - The patient's reflexes were normal in all extremities and he had no pathological reflexes.  Sensory - Light touch, temperature/pinprick were assessed and were normal.   Coordination - The patient had normal movements in the hands with no ataxia or dysmetria. Tremor was absent.  Gait and Station - not tested    Disposition: 01-Home or Self Care  Discharge Instructions    Ambulatory referral to Neurology    Complete by:  As directed   Stroke patient. Dr. Leonie Man prefers follow up in 1 month            Medication List    TAKE these medications        aspirin EC 81 MG tablet  Take 81 mg by mouth daily.     chlorpheniramine-HYDROcodone 10-8 MG/5ML Lqcr  Commonly known as:  TUSSIONEX PENNKINETIC ER  Take 5 mLs by mouth every 12 (twelve) hours as needed for cough.     hydrochlorothiazide 25 MG tablet  Commonly known as:  HYDRODIURIL  Take 25 mg by mouth daily.     losartan  100 MG tablet  Commonly known as:  COZAAR  Take 100 mg by mouth daily.     metoprolol 50 MG tablet  Commonly known as:  LOPRESSOR  Take 50 mg by mouth 2 (two) times daily.     simvastatin 40 MG tablet  Commonly known as:  ZOCOR  Take 40 mg by mouth daily.     Travoprost (BAK Free) 0.004 % Soln ophthalmic solution  Commonly known as:  TRAVATAN  Place 1 drop  into both eyes at bedtime.           Follow-up Information    Follow up with Sheetal Lyall, MD In 1 month.   Specialties:  Neurology, Radiology   Why:  Stroke Clinic, Office will call you with appointment date & time   Contact information:   [address] [place] [address] [phone]       Follow up with Simona Huh, MD In 2 weeks.   Specialty:  Family Medicine   Contact information:   [address], [place] [address] [phone]       Signed: Antony Contras 12/20/2013, 3:16 PM

## 2013-12-20 NOTE — Progress Notes (Signed)
Son placed pt on home CPAP while I was in room. Pt tolerating well.

## 2013-12-20 NOTE — Progress Notes (Signed)
PMR Admission Coordinator Pre-Admission Assessment  Patient: Brian Wolf is an 80 y.o., male MRN: [id number] DOB: [birth year] Height: 5\' 5"  (165.1 cm) Weight: 77.3 kg (170 lb 6.7 oz)  Insurance Information HMO: yes PPO: PCP: IPA: 80/20: OTHER: Medicare advantage program PRIMARY: Bubba Hales Policy#: [id number] Subscriber: pt CM Name: Lenna Sciara Phone#: [phone] Fax#: [phone] Pre-Cert#: [id number] Employer: retired [employer] to f/u [phone] Benefits: Phone #: [phone] Name: 11/18 Eff. Date: 02/01/13 Deduct: none Out of Pocket Max: $4900 Life Max: none CIR: $345 per day, days 1-5 SNF: no copay days 1-20; $155 per day days 21-52, no cpay days 53-100 Outpatient: $40 copay per visit Co-Pay: no visit limit Home Health: 100% Co-Pay: no visit limit DME: 80% Co-Pay: 20% Providers: in network  SECONDARY: none   Medicaid Application Date: Case Manager:  Disability Application Date: Case Worker:   Emergency Facilities manager Information    Name Relation Home Work Mobile   Amyx,Frieda Spouse [phone]  [phone]   Rakan, Limes [phone]       Current Medical History  Patient Admitting Diagnosis: Right temporal -occipital hemorrhagic infarct  History of Present Illness:Brian Wolf is a 80 y.o. LH- male who was noted on 12/13/13 by wife that he was acting strange. He was complaining of a right temporal pain when he coughed and seemed off balance. She noted he went out to the drive way and seemed to be walking to the right and then was not talking sensibly. Wife also noted some weakness in his left hand. On 11/13 he was sent from PCP office to ED for evaluation. On arrival  to ED initial CT scan showed a large right acute hemorraghic infarct. 2D echo with EF 55-60% with no regional wall abnormality. Patient with lethargy as well as apneic episodes on 11/14 and repeat CT head was with evolving right parietal IPH with mild shift and mild right lateral ventricle effacement and was started on hypertonic saline for edema. Patient with fever due to enterobacter UTI and was started on cipro for treatment. MRI/MRA brain with Large RIGHT temporal occipital lobar hemorrhage with surrounding edema and subcentimeter of acute infarct right lateral temporal lobe. Mentation has improved and patient taken off bedrest 11/16 with initiation of therapy. He was started on regular diet and cognitive evaluation with executive deficits with complex functional tasks. Patient with resultant ataxic gait, left inattention due to visual field deficits as well as intermittent confusion with poor safety awareness.   Right grest toe pain/swelling likely gout. Has responded well to colchicine. Hypokalemia likely related to HCTZ.  Total: 2 NIH    Past Medical History  Past Medical History  Diagnosis Date  . Hypertension     Family History  family history includes Hyperlipidemia in his mother; Hypertension in his father and mother.  Prior Rehab/Hospitalizations: none  Current Medications  Current facility-administered medications: acetaminophen (TYLENOL) tablet 650 mg, 650 mg, Oral, Q4H PRN, 650 mg at 12/19/13 1746 **OR** acetaminophen (TYLENOL) suppository 650 mg, 650 mg, Rectal, Q4H PRN, Marliss Coots, PA-C; colchicine tablet 0.6 mg, 0.6 mg, Oral, BID, Donzetta Starch, NP, 0.6 mg at 12/20/13 1045; hydrALAZINE (APRESOLINE) injection 25 mg, 25 mg, Intravenous, Q8H PRN, Antony Contras, MD, 25 mg at 12/17/13 2352 hydrochlorothiazide (HYDRODIURIL) tablet 37.5 mg, 37.5 mg, Oral, Daily, Donzetta Starch, NP, 37.5 mg at 12/20/13 1045; labetalol (NORMODYNE,TRANDATE) injection 10-40 mg, 10-40 mg,  Intravenous, Q10 min PRN, Marliss Coots, PA-C, 30 mg at 12/17/13 2257; latanoprost (XALATAN) 0.005 %  ophthalmic solution 1 drop, 1 drop, Both Eyes, QHS, Marliss Coots, PA-C, 1 drop at 12/19/13 2257 levofloxacin (LEVAQUIN) tablet 250 mg, 250 mg, Oral, q1800, Antony Contras, MD, 250 mg at 12/19/13 1746; losartan (COZAAR) tablet 100 mg, 100 mg, Oral, Daily, Marliss Coots, PA-C, 100 mg at 12/20/13 1045; metoprolol (LOPRESSOR) tablet 50 mg, 50 mg, Oral, BID, Rosalin Hawking, MD, 50 mg at 12/20/13 1045; pantoprazole (PROTONIX) EC tablet 40 mg, 40 mg, Oral, Daily, Rosalin Hawking, MD, 40 mg at 12/20/13 1044 [START ON 12/21/2013] potassium chloride SA (K-DUR,KLOR-CON) CR tablet 40 mEq, 40 mEq, Oral, Daily, Donzetta Starch, NP; senna-docusate (Senokot-S) tablet 1 tablet, 1 tablet, Oral, BID, Marliss Coots, PA-C, 1 tablet at 12/20/13 1049  Patients Current Diet: Diet Heart Regular diet with thin liquids  Precautions / Restrictions Precautions Precautions: Fall Precaution Comments: L field deficit Restrictions Weight Bearing Restrictions: No   Prior Activity Level Community (5-7x/wk): active getting outside of home ; sedentary; Training and development officer; drices infrequently. Drives infrequently but he and his wife go out into the community a lot. They had plans to travel to [place] during this time. He visits every 4 years. He was disappointed that he was unable to go. Wife and pt married 28 years. She is from [place]. His son's Mom and pt were divorced, but she recently died a few weeks ago. Son is an ER MD in [place] but is here in [place] now. He has had a lot of experience with acute inpt rehab due to two family members recently in [place]. He has a lot of questions.  Home Assistive Devices / Equipment Home Assistive Devices/Equipment: Eyeglasses Home Equipment: None  Prior Functional Level Prior Function Level of Independence: Independent Comments: acrylic painting--hobby  Current Functional Level Cognition   Arousal/Alertness: Awake/alert Overall Cognitive Status: Impaired/Different from baseline Current Attention Level: Selective Orientation Level: Oriented to person, Oriented to time, Oriented to place, Disoriented to situation Following Commands: Follows one step commands with increased time Safety/Judgement: Decreased awareness of safety, Decreased awareness of deficits General Comments: Pt required Mod A +2 with mobility and was unable to tourn to sito ntoilet and states that he could go home saefly alone Attention: Focused, Sustained, Selective, Alternating Focused Attention: Appears intact Sustained Attention: Appears intact Selective Attention: Appears intact Alternating Attention: Appears intact Memory: Appears intact Awareness: Impaired Awareness Impairment: Emergent impairment, Anticipatory impairment Problem Solving: Impaired Problem Solving Impairment: Functional complex, Verbal complex Executive Function: Organizing, Sequencing, Reasoning Reasoning: Impaired Reasoning Impairment: Verbal complex, Functional complex Sequencing: Impaired Sequencing Impairment: Functional complex Organizing: Impaired Organizing Impairment: Functional complex Behaviors: Impulsive Safety/Judgment: Impaired   Extremity Assessment (includes Sensation/Coordination)          ADLs  Overall ADL's : Needs assistance/impaired Eating/Feeding: Supervision/ safety Grooming: Supervision/safety, Set up, Cueing for compensatory techniques Upper Body Bathing: Minimal assitance, Sitting Lower Body Bathing: Set up, Supervison/ safety, Sit to/from stand (to look left) Upper Body Dressing : Moderate assistance, Sitting Lower Body Dressing: Set up, Supervision/safety, Sit to/from stand (to look left) Toilet Transfer: Moderate assistance, Ambulation, Comfort height toilet, +2 for physical assistance, +2 for safety/equipment Toileting- Clothing Manipulation and Hygiene: Maximal assistance, Sit to/from  stand General ADL Comments: apparent decline in function since intitial eval.    Mobility  Overal bed mobility: Needs Assistance Bed Mobility: Rolling Rolling: Min assist Supine to sit: Min assist General bed mobility comments: A to ensure balance and control with sitting EOB. A for trunk support into sitting.    Transfers  Overall  transfer level: Needs assistance Equipment used: 2 person hand held assist Transfers: Sit to/from Stand Sit to Stand: Mod assist, +2 physical assistance Stand pivot transfers: Mod assist, +2 physical assistance General transfer comment: Continues to requires Mod A +2 to power up into standing and to block LE from sliding out. Cues for positioning and to control     Ambulation / Gait / Stairs / Wheelchair Mobility  Ambulation/Gait Ambulation/Gait assistance: Min assist, +2 safety/equipment Ambulation Distance (Feet): 120 Feet Assistive device: Rolling walker (2 wheeled) Gait Pattern/deviations: Step-to pattern, Wide base of support, Antalgic, Decreased step length - left, Decreased step length - right Gait velocity: decr with little ability to ince Gait velocity interpretation: Below normal speed for age/gender General Gait Details: A for balance especially with turns and with weight shifting. Cues for upright posture and safe management of RW    Posture / Balance      Special needs/care consideration BiPAP/CPAP pt has not used in 4 years Bowel mgmt: continent Bladder mgmt: continent    Previous Home Environment Living Arrangements: Spouse/significant other Lives With: Spouse Available Help at Discharge: Family, Available 24 hours/day Type of Home: House Home Layout: Two level, Laundry or work area in basement, Other (Comment) (only use main level of home) Alternate Level Stairs-Number of Steps: See entrance stairs above Home Access: Stairs to enter Entrance Stairs-Rails: None (son is getting hand rails to be able to reach  both) Entrance Stairs-Number of Steps: 7 step entry per wife Bathroom Shower/Tub: Tub/shower unit, Architectural technologist: Standard Bathroom Accessibility: Yes How Accessible: Accessible via walker [place]: No Additional Comments: son having hand rails placed in bathroom  Discharge Living Setting Plans for Discharge Living Setting: Patient's home, Lives with (comment), Other (Comment) (wife, [name] of 28 years) Type of Home at Discharge: House Discharge Home Layout: Two level, Laundry or work area in basement, Other (Comment) (only use main level) Discharge Home Access: Stairs to enter Entrance Stairs-Rails: None (son having rails installed to be able to reach both) Entrance Stairs-Number of Steps: 7 steps per wife Discharge Bathroom Shower/Tub: Tub/shower unit, Chief Financial Officer: Standard Discharge Bathroom Accessibility: Yes How Accessible: Accessible via walker (son having hand rails installed in bathrrom) Does the patient have any problems obtaining your medications?: No  Social/Family/Support Systems Patient Roles: Spouse, Parent, Other (Comment) (has son in [place] and dtr in [place]) [place]: Albina Billet, wife Anticipated Caregiver: wife Anticipated Caregiver's Contact Information: see above Ability/Limitations of Caregiver: no limitations Caregiver Availability: 24/7 Discharge Plan Discussed with Primary Caregiver: Yes Is Caregiver In Agreement with Plan?: Yes Does Caregiver/Family have Issues with Lodging/Transportation while Pt is in Rehab?: No  Goals/Additional Needs Patient/Family Goal for Rehab: supervision with PT, OT, and SLP Expected length of stay: [facility] 7 to 10 days Cultural Considerations: Pt is Asian Pt/Family Agrees to Admission and willing to participate: Yes Program Orientation Provided & Reviewed with Pt/Caregiver Including Roles & Responsibilities: Yes  Decrease burden of Care through IP rehab admission:  n/a  Possible need for SNF placement upon discharge: no  Patient Condition: This patient's condition remains as documented in the consult dated 12/18/2013, in which the Rehabilitation Physician determined and documented that the patient's condition is appropriate for intensive rehabilitative care in an inpatient rehabilitation facility. Will admit to inpatient rehab today.  Preadmission Screen Completed By: Cleatrice Burke, 12/20/2013 2:52 PM ______________________________________________________________________  Discussed status with Dr. Letta Pate on 12/20/2013 at 1451 and received telephone approval for admission today.  Admission Coordinator: Julious Payer,  Audelia Acton, time [id number] Date 12/20/2013          Cosigned by: Charlett Blake, MD at 12/20/2013 3:17 PM  Revision History     Date/Time User Provider Type Action   12/20/2013 3:17 PM Charlett Blake, MD Physician Cosign   12/20/2013 3:03 PM Cleatrice Burke, RN Rehab Admission Coordinator Sign   12/20/2013 2:52 PM Cleatrice Burke, RN Rehab Admission Coordinator Sign   View Details Report

## 2013-12-20 NOTE — PMR Pre-admission (Signed)
PMR Admission Coordinator Pre-Admission Assessment  Patient: Brian Wolf is an 79 y.o., male MRN: [id number] DOB: February 18, 1931 Height: 5\' 5"  (165.1 cm) Weight: 77.3 kg (170 lb 6.7 oz)              Insurance Information HMO: yes    PPO:      PCP:      IPA:      80/20:      OTHER: Medicare advantage program PRIMARY: Brian Wolf      Policy#: [id number]      Subscriber: pt CM Name: Brian Wolf      Phone#: [phone] ext [phone]     Fax#: [phone] Pre-Cert#: [id number]      Employer: retired [employer] to f/u [phone] Benefits:  Phone #: [phone]     Name: 11/18 Eff. Date: 02/01/13     Deduct: none      Out of Pocket Max: $4900      Life Max: none CIR: $345 per day, days 1-5      SNF: no copay days 1-20; $155 per day days 21-52, no cpay days 53-100 Outpatient: $40 copay per visit     Co-Pay: no visit limit Home Health: 100%      Co-Pay: no visit limit DME: 80%     Co-Pay: 20% Providers: in network  SECONDARY: none        Medicaid Application Date:       Case Manager:  Disability Application Date:       Case Worker:   Emergency Facilities manager Information    Name Relation Home Work Mobile   Wolf,Brian Spouse [phone]  [phone]   Wolf, Brian [phone]       Current Medical History  Patient Admitting Diagnosis: Right temporal -occipital hemorrhagic infarct  History of Present Illness:Brian Wolf is a 80 y.o. LH- male who was noted on 12/13/13 by wife that he was acting strange. He was complaining of a right temporal pain when he coughed and seemed off balance. She noted he went out to the drive way and seemed to be walking to the right and then was not talking sensibly. Wife also noted some weakness in his left hand. On 11/13 he was sent from PCP office to ED for evaluation. On arrival to ED initial CT scan showed a large right acute hemorraghic infarct. 2D echo with EF 55-60% with no regional wall abnormality. Patient with lethargy  as well as apneic episodes on 11/14 and repeat CT head was with evolving right parietal IPH with mild shift and mild right lateral ventricle effacement and was started on hypertonic saline for edema. Patient with fever due to enterobacter UTI and was started on cipro for treatment. MRI/MRA brain with Large RIGHT temporal occipital lobar hemorrhage with surrounding edema and subcentimeter of acute infarct right lateral temporal lobe. Mentation has improved and patient taken off bedrest 11/16 with initiation of therapy. He was started on regular diet and cognitive evaluation with executive deficits with complex functional tasks. Patient with resultant ataxic gait, left inattention due to visual field deficits as well as intermittent confusion with poor safety awareness.   Right grest toe pain/swelling likely gout. Has responded well to colchicine. Hypokalemia likely related to HCTZ.  Total: 2 NIH    Past Medical History  Past Medical History  Diagnosis Date  . Hypertension     Family History  family history includes Hyperlipidemia in his mother; Hypertension in his father and mother.  Prior Rehab/Hospitalizations: none  Current  Medications  Current facility-administered medications: acetaminophen (TYLENOL) tablet 650 mg, 650 mg, Oral, Q4H PRN, 650 mg at 12/19/13 1746 **OR** acetaminophen (TYLENOL) suppository 650 mg, 650 mg, Rectal, Q4H PRN, Marliss Coots, PA-C;  colchicine tablet 0.6 mg, 0.6 mg, Oral, BID, Donzetta Starch, NP, 0.6 mg at 12/20/13 1045;  hydrALAZINE (APRESOLINE) injection 25 mg, 25 mg, Intravenous, Q8H PRN, Antony Contras, MD, 25 mg at 12/17/13 2352 hydrochlorothiazide (HYDRODIURIL) tablet 37.5 mg, 37.5 mg, Oral, Daily, Donzetta Starch, NP, 37.5 mg at 12/20/13 1045;  labetalol (NORMODYNE,TRANDATE) injection 10-40 mg, 10-40 mg, Intravenous, Q10 min PRN, Marliss Coots, PA-C, 30 mg at 12/17/13 2257;  latanoprost (XALATAN) 0.005 % ophthalmic solution 1 drop, 1 drop, Both Eyes, QHS, Marliss Coots, PA-C, 1 drop at 12/19/13 2257 levofloxacin (LEVAQUIN) tablet 250 mg, 250 mg, Oral, q1800, Antony Contras, MD, 250 mg at 12/19/13 1746;  losartan (COZAAR) tablet 100 mg, 100 mg, Oral, Daily, Marliss Coots, PA-C, 100 mg at 12/20/13 1045;  metoprolol (LOPRESSOR) tablet 50 mg, 50 mg, Oral, BID, Rosalin Hawking, MD, 50 mg at 12/20/13 1045;  pantoprazole (PROTONIX) EC tablet 40 mg, 40 mg, Oral, Daily, Rosalin Hawking, MD, 40 mg at 12/20/13 1044 [START ON 12/21/2013] potassium chloride SA (K-DUR,KLOR-CON) CR tablet 40 mEq, 40 mEq, Oral, Daily, Donzetta Starch, NP;  senna-docusate (Senokot-S) tablet 1 tablet, 1 tablet, Oral, BID, Marliss Coots, PA-C, 1 tablet at 12/20/13 1049  Patients Current Diet: Diet Heart Regular diet with thin liquids  Precautions / Restrictions Precautions Precautions: Fall Precaution Comments: L field deficit Restrictions Weight Bearing Restrictions: No   Prior Activity Level Community (5-7x/wk): active getting outside of home ; sedentary; Training and development officer; drices infrequently. Drives infrequently but he and his wife go out into the community a lot. They had plans to travel to [place] during this time. He visits every 4 years. He was disappointed that he was unable to go. Wife and pt married 28 years. She is from [place]. His son's Mom and pt were divorced, but she recently died a few weeks ago. Son is an ER MD in [place] but is here in [place] now. He has had a lot of experience with acute inpt rehab due to two family members recently in [place]. He has a lot of questions.  Home Assistive Devices / Equipment Home Assistive Devices/Equipment: Eyeglasses Home Equipment: None  Prior Functional Level Prior Function Level of Independence: Independent Comments: acrylic painting--hobby  Current Functional Level Cognition  Arousal/Alertness: Awake/alert Overall Cognitive Status: Impaired/Different from baseline Current Attention Level: Selective Orientation Level: Oriented to person,  Oriented to time, Oriented to place, Disoriented to situation Following Commands: Follows one step commands with increased time Safety/Judgement: Decreased awareness of safety, Decreased awareness of deficits General Comments: Pt required Mod A +2 with mobility and was unable to tourn to sito ntoilet and states that he could go home saefly alone Attention: Focused, Sustained, Selective, Alternating Focused Attention: Appears intact Sustained Attention: Appears intact Selective Attention: Appears intact Alternating Attention: Appears intact Memory: Appears intact Awareness: Impaired Awareness Impairment: Emergent impairment, Anticipatory impairment Problem Solving: Impaired Problem Solving Impairment: Functional complex, Verbal complex Executive Function: Organizing, Sequencing, Reasoning Reasoning: Impaired Reasoning Impairment: Verbal complex, Functional complex Sequencing: Impaired Sequencing Impairment: Functional complex Organizing: Impaired Organizing Impairment: Functional complex Behaviors: Impulsive Safety/Judgment: Impaired    Extremity Assessment (includes Sensation/Coordination)          ADLs  Overall ADL's : Needs assistance/impaired Eating/Feeding: Supervision/ safety Grooming: Supervision/safety, Set up, Cueing for  compensatory techniques Upper Body Bathing: Minimal assitance, Sitting Lower Body Bathing: Set up, Supervison/ safety, Sit to/from stand (to look left) Upper Body Dressing : Moderate assistance, Sitting Lower Body Dressing: Set up, Supervision/safety, Sit to/from stand (to look left) Toilet Transfer: Moderate assistance, Ambulation, Comfort height toilet, +2 for physical assistance, +2 for safety/equipment Toileting- Clothing Manipulation and Hygiene: Maximal assistance, Sit to/from stand General ADL Comments: apparent decline in function since intitial eval.    Mobility  Overal bed mobility: Needs Assistance Bed Mobility: Rolling Rolling: Min  assist Supine to sit: Min assist General bed mobility comments: A to ensure balance and control with sitting EOB. A for trunk support into sitting.    Transfers  Overall transfer level: Needs assistance Equipment used: 2 person hand held assist Transfers: Sit to/from Stand Sit to Stand: Mod assist, +2 physical assistance Stand pivot transfers: Mod assist, +2 physical assistance General transfer comment: Continues to requires Mod A +2 to power up into standing and to block LE from sliding out. Cues for positioning and to control     Ambulation / Gait / Stairs / Wheelchair Mobility  Ambulation/Gait Ambulation/Gait assistance: Min assist, +2 safety/equipment Ambulation Distance (Feet): 120 Feet Assistive device: Rolling walker (2 wheeled) Gait Pattern/deviations: Step-to pattern, Wide base of support, Antalgic, Decreased step length - left, Decreased step length - right Gait velocity: decr with little ability to ince Gait velocity interpretation: Below normal speed for age/gender General Gait Details: A for balance especially with turns and with weight shifting. Cues for upright posture and safe management of RW    Posture / Balance      Special needs/care consideration BiPAP/CPAP pt has not used in 4 years Bowel mgmt: continent Bladder mgmt: continent    Previous Home Environment Living Arrangements: Spouse/significant other  Lives With: Spouse Available Help at Discharge: Family, Available 24 hours/day Type of Home: House Home Layout: Two level, Laundry or work area in basement, Other (Comment) (only use main level of home) Alternate Level Stairs-Number of Steps: See entrance stairs above Home Access: Stairs to enter Entrance Stairs-Rails: None (son is getting hand rails to be able to reach both) Entrance Stairs-Number of Steps: 7 step entry per wife Bathroom Shower/Tub: Tub/shower unit, Architectural technologist: Standard Bathroom Accessibility: Yes How Accessible: Accessible  via walker [place]: No Additional Comments: son having hand rails placed in bathroom  Discharge Living Setting Plans for Discharge Living Setting: Patient's home, Lives with (comment), Other (Comment) (wife, Brian Wolf of 28 years) Type of Home at Discharge: House Discharge Home Layout: Two level, Laundry or work area in basement, Other (Comment) (only use main level) Discharge Home Access: Stairs to enter Entrance Stairs-Rails: None (son having rails installed to be able to reach both) Entrance Stairs-Number of Steps: 7 steps per wife Discharge Bathroom Shower/Tub: Tub/shower unit, Chief Financial Officer: Standard Discharge Bathroom Accessibility: Yes How Accessible: Accessible via walker (son having hand rails installed in bathrrom) Does the patient have any problems obtaining your medications?: No  Social/Family/Support Systems Patient Roles: Spouse, Parent, Other (Comment) (has son in [place] and dtr in [place]) [place]: Brian Wolf, wife Anticipated Caregiver: wife Anticipated Caregiver's Contact Information: see above Ability/Limitations of Caregiver: no limitations Caregiver Availability: 24/7 Discharge Plan Discussed with Primary Caregiver: Yes Is Caregiver In Agreement with Plan?: Yes Does Caregiver/Family have Issues with Lodging/Transportation while Pt is in Rehab?: No  Goals/Additional Needs Patient/Family Goal for Rehab: supervision with PT, OT, and SLP Expected length of stay: [facility]OS 7 to 10  days Cultural Considerations: Pt is Asian Pt/Family Agrees to Admission and willing to participate: Yes Program Orientation Provided & Reviewed with Pt/Caregiver Including Roles  & Responsibilities: Yes  Decrease burden of Care through IP rehab admission: n/a  Possible need for SNF placement upon discharge: no  Patient Condition: This patient's condition remains as documented in the consult dated 12/18/2013, in which the Rehabilitation Physician  determined and documented that the patient's condition is appropriate for intensive rehabilitative care in an inpatient rehabilitation facility. Will admit to inpatient rehab today.  Preadmission Screen Completed By:  Cleatrice Burke, 12/20/2013 2:52 PM ______________________________________________________________________   Discussed status with Dr. Letta Pate on 12/20/2013 at  1451 and received telephone approval for admission today.  Admission Coordinator:  Cleatrice Burke, time [id number] Date 12/20/2013

## 2013-12-20 NOTE — Progress Notes (Signed)
Physical Therapy Treatment Patient Details Name: Brian Wolf MRN: [id number] DOB: 1932/01/17 Today's Date: 12/20/2013    History of Present Illness 80 year old male admitted 12/14/13 due to AMS. PMH significant for HTN. Large RIGHT temporal occipital lobar hemorrhage with surrounding edema. CXR shows Cardiomegaly with mild pulmonary vascular congestion.    PT Comments    Patient making much better progress today and is more appropriate with cues for mobility. Patient appears more motivated and eager to participate in therapy. He was very appreciative of the therapist working with him today and stated he was ready for CIR so he could get home. Continue to recommend comprehensive inpatient rehab (CIR) for post-acute therapy needs.   Follow Up Recommendations  CIR     Equipment Recommendations       Recommendations for Other Services       Precautions / Restrictions Precautions Precautions: Fall Precaution Comments: L field deficit    Mobility  Bed Mobility Overal bed mobility: Needs Assistance   Rolling: Min assist   Supine to sit: Min assist     General bed mobility comments: A to ensure balance and control with sitting EOB. A for trunk support into sitting.  Transfers Overall transfer level: Needs assistance Equipment used: 2 person hand held assist   Sit to Stand: Mod assist;+2 physical assistance         General transfer comment: Continues to requires Mod A +2 to power up into standing and to block LE from sliding out. Cues for positioning and to control   Ambulation/Gait Ambulation/Gait assistance: Min assist;+2 safety/equipment Ambulation Distance (Feet): 120 Feet Assistive device: Rolling walker (2 wheeled) Gait Pattern/deviations: Step-to pattern;Wide base of support;Antalgic;Decreased step length - left;Decreased step length - right   Gait velocity interpretation: Below normal speed for age/gender General Gait Details: A for balance especially  with turns and with weight shifting. Cues for upright posture and safe management of RW   Stairs            Wheelchair Mobility    Modified Rankin (Stroke Patients Only) Modified Rankin (Stroke Patients Only) Pre-Morbid Rankin Score: No symptoms Modified Rankin: Moderately severe disability     Balance             Standing balance-[name]: Poor                      Cognition Arousal/Alertness: Awake/alert Behavior During Therapy: WFL for tasks assessed/performed Overall Cognitive Status: Impaired/Different from baseline Area of Impairment: Memory;Safety/judgement;Awareness       Following Commands: Follows one step commands with increased time Safety/Judgement: Decreased awareness of safety;Decreased awareness of deficits   Problem Solving: Slow processing;Decreased initiation;Difficulty sequencing;Requires verbal cues;Requires tactile cues      Exercises      General Comments        Pertinent Vitals/Pain Faces Pain Scale: Hurts little more Pain Location: toe Pain Descriptors / Indicators: Grimacing Pain Intervention(s): Monitored during session    Home Living                      Prior Function            PT Goals (current goals can now be found in the care plan section) Progress towards PT goals: Progressing toward goals    Frequency  Min 4X/week    PT Plan Current plan remains appropriate    Co-evaluation             End  of Session Equipment Utilized During Treatment: Gait belt Activity Tolerance: Patient tolerated treatment well Patient left: in chair;with call bell/phone within reach     Time: 1116-1141 PT Time Calculation (min) (ACUTE ONLY): 25 min  Charges:  $Gait Training: 8-22 mins $Therapeutic Activity: 8-22 mins                    G Codes:      Jacqualyn Posey 12/20/2013, 12:50 PM  12/20/2013 Jacqualyn Posey PTA [phone] pager [phone] office

## 2013-12-21 ENCOUNTER — Inpatient Hospital Stay (HOSPITAL_COMMUNITY): Payer: Medicare Other | Admitting: Speech Pathology

## 2013-12-21 ENCOUNTER — Inpatient Hospital Stay (HOSPITAL_COMMUNITY): Payer: Medicare Other | Admitting: Physical Therapy

## 2013-12-21 ENCOUNTER — Inpatient Hospital Stay (HOSPITAL_COMMUNITY): Payer: Medicare Other | Admitting: Occupational Therapy

## 2013-12-21 DIAGNOSIS — I638 Other cerebral infarction: Secondary | ICD-10-CM

## 2013-12-21 LAB — COMPREHENSIVE METABOLIC PANEL
ALT: 32 U/L (ref 0–53)
AST: 28 U/L (ref 0–37)
Albumin: 2.6 g/dL — ABNORMAL LOW (ref 3.5–5.2)
Alkaline Phosphatase: 70 U/L (ref 39–117)
Anion gap: 12 (ref 5–15)
BILIRUBIN TOTAL: 0.4 mg/dL (ref 0.3–1.2)
BUN: 19 mg/dL (ref 6–23)
CO2: 28 meq/L (ref 19–32)
CREATININE: 0.96 mg/dL (ref 0.50–1.35)
Calcium: 8.7 mg/dL (ref 8.4–10.5)
Chloride: 94 mEq/L — ABNORMAL LOW (ref 96–112)
GFR calc Af Amer: 88 mL/min — ABNORMAL LOW (ref >90)
GFR, EST NON AFRICAN AMERICAN: 76 mL/min — AB (ref >90)
Glucose, Bld: 129 mg/dL — ABNORMAL HIGH (ref 70–99)
POTASSIUM: 4.3 meq/L (ref 3.7–5.3)
Sodium: 134 mEq/L — ABNORMAL LOW (ref 137–147)
Total Protein: 7.2 g/dL (ref 6.0–8.3)

## 2013-12-21 LAB — CBC WITH DIFFERENTIAL/PLATELET
BASOS ABS: 0 10*3/uL (ref 0.0–0.1)
Basophils Relative: 0 % (ref 0–1)
Eosinophils Absolute: 0.3 10*3/uL (ref 0.0–0.7)
Eosinophils Relative: 2 % (ref 0–5)
HEMATOCRIT: 33.5 % — AB (ref 39.0–52.0)
HEMOGLOBIN: 11.1 g/dL — AB (ref 13.0–17.0)
LYMPHS PCT: 18 % (ref 12–46)
Lymphs Abs: 2.5 10*3/uL (ref 0.7–4.0)
MCH: 30 pg (ref 26.0–34.0)
MCHC: 33.1 g/dL (ref 30.0–36.0)
MCV: 90.5 fL (ref 78.0–100.0)
MONO ABS: 1.7 10*3/uL — AB (ref 0.1–1.0)
MONOS PCT: 12 % (ref 3–12)
NEUTROS PCT: 67 % (ref 43–77)
Neutro Abs: 9 10*3/uL — ABNORMAL HIGH (ref 1.7–7.7)
Platelets: 355 10*3/uL (ref 150–400)
RBC: 3.7 MIL/uL — ABNORMAL LOW (ref 4.22–5.81)
RDW: 13.5 % (ref 11.5–15.5)
WBC: 13.4 10*3/uL — AB (ref 4.0–10.5)

## 2013-12-21 MED ORDER — WHITE PETROLATUM GEL
Status: AC
  Administered 2013-12-21: 0.2
  Filled 2013-12-21: qty 5

## 2013-12-21 NOTE — Evaluation (Signed)
Occupational Therapy Assessment and Plan  Patient Details  Name: Brian Wolf MRN: [id number] Date of Birth: 28-Sep-1931  OT Diagnosis: cognitive deficits, disturbance of vision and muscle weakness (generalized) Rehab Potential: Rehab Potential (ACUTE ONLY): Excellent ELOS: 7-9 days   Today's Date: 12/21/2013 OT Individual Time: 0800-0900 OT Individual Time Calculation (min): 60 min     Problem List:  Patient Active Problem List   Diagnosis Date Noted  . Gout flare 12/20/2013  . Benign essential HTN 12/20/2013  . Acute hemorrhagic infarction of brain 12/20/2013  . Hemorrhagic stroke   . Other secondary acute gout of left foot   . Hypokalemia 12/19/2013  . Altered mental status   . Encounter for central line placement   . Encephalopathy acute   . ICH (intracerebral hemorrhage) 12/14/2013  . Fever     Past Medical History:  Past Medical History  Diagnosis Date  . Hypertension    Past Surgical History:  Past Surgical History  Procedure Laterality Date  . Abdominal surgery      Assessment & Plan Clinical Impression: Patient is a 80 y.o. year old male with recent admission to the hospital on 11/13, he was sent from PCP office to ED for evaluation. On arrival to ED initial CT scan showed a large right acute hemorraghic infarct. 2D echo with EF 55-60% with no regional wall abnormality. Patient with lethargy as well as apneic episodes on 11/14 and repeat CT head was with evolving right parietal IPH with mild shift and mild right lateral ventricle effacementPatient transferred to CIR on 12/20/2013 .    Patient currently requires min with basic self-care skills secondary to decreased coordination, field cut, decreased awareness, decreased safety awareness, decreased memory and delayed processing and decreased standing balance and decreased balance strategies.  Prior to hospitalization, patient could complete ADLs with independent .  Patient will benefit from skilled  intervention to decrease level of assist with basic self-care skills and increase independence with basic self-care skills prior to discharge home with care partner.  Anticipate patient will require intermittent supervision and follow up outpatient.  OT - End of Session Activity Tolerance: Tolerates 30+ min activity with multiple rests Endurance Deficit: Yes Endurance Deficit Description: Dyspnea noted at 2/4 with bathing and dressing tasks EOB. OT Assessment Rehab Potential (ACUTE ONLY): Excellent OT Patient demonstrates impairments in the following area(s): Balance;Cognition;Endurance;Safety;Vision OT Basic ADL's Functional Problem(s): Grooming;Bathing;Dressing;Toileting OT Advanced ADL's Functional Problem(s): Simple Meal Preparation OT Transfers Functional Problem(s): Toilet;Tub/Shower OT Plan OT Intensity: Minimum of 1-2 x/day, 45 to 90 minutes OT Frequency: 5 out of 7 days OT Duration/Estimated Length of Stay: 7-9 days OT Treatment/Interventions: Balance/vestibular training;Cognitive remediation/compensation;Community reintegration;Discharge planning;Functional mobility training;DME/adaptive equipment instruction;Disease mangement/prevention;Neuromuscular re-education;Self Care/advanced ADL retraining;Patient/family education;Therapeutic Activities;UE/LE Coordination activities;UE/LE Strength taining/ROM;Therapeutic Exercise;Psychosocial support OT Self Feeding Anticipated Outcome(s): independent OT Basic Self-Care Anticipated Outcome(s): mmodified independent OT Toileting Anticipated Outcome(s): modified independent OT Bathroom Transfers Anticipated Outcome(s): modified independent OT Recommendation Patient destination: Home Follow Up Recommendations: Outpatient OT Equipment Recommended: Tub/shower seat  OT Evaluation Precautions/Restrictions  Precautions Precautions: Fall (Simultaneous filing. User may not have seen previous data.) Precaution Comments: L field deficit  (Simultaneous filing. User may not have seen previous data.) Restrictions Weight Bearing Restrictions: No (Simultaneous filing. User may not have seen previous data.)  Pain Pain Assessment Pain Assessment: No/denies pain Pain Score: 0-No pain Home Living/Prior Functioning Home Living Available Help at Discharge: Family, Available 24 hours/day (Simultaneous filing. User may not have seen previous data.) Type of Home: House (Simultaneous filing. User  may not have seen previous data.) Home Access: Stairs to enter (Simultaneous filing. User may not have seen previous data.) Entrance Stairs-Number of Steps: 16 indoor steps from garage (Simultaneous filing. User may not have seen previous data.) Entrance Stairs-Rails: Left (family to install 2 rails so he can reach both Simultaneous filing. User may not have seen previous data.) Home Layout: Two level, Laundry or work area in basement, Other (Comment) (studio in Energy manager. User may not have seen previous data.) Additional Comments: son plans to make modifications with grab bars and elevated commode as needed (Simultaneous filing. User may not have seen previous data.)  Lives With: Spouse (Simultaneous filing. User may not have seen previous data.) IADL History Homemaking Responsibilities: Yes Meal Prep Responsibility: Primary Homemaking Comments: Pt reports he and the wife both cook and clean. Current License: Yes Education: Master's degree in Art per pt  Prior Function Level of Independence: Independent with basic ADLs, Independent with gait, Independent with transfers (Simultaneous filing. User may not have seen previous data.)  Able to Take Stairs?: Yes (Simultaneous filing. User may not have seen previous data.) Driving: Yes (rarely Simultaneous filing. 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 User may not have seen previous data.) ADL  See FIM scale for details  Vision/Perception  Vision- History Baseline Vision/History: No visual deficits Patient Visual Report: No change from baseline Vision- Assessment Vision Assessment?: Yes Eye Alignment: Impaired (comment) (left eye lid with ptosis and sits inferior compared to the right eye) Ocular Range of Motion: Restricted on the left;Impaired-to be further tested in functional context;Restricted looking up (Pt with decreased ability to look up and laterally and maintain gaze with the left eye) Alignment/Gaze Preference: Within Defined Limits Tracking/Visual Pursuits: Decreased smoothness of eye movement to LEFT superior field Saccades: Impaired - to be further tested in functional context Convergence: Within functional limits Visual Fields: Left visual field deficit Additional Comments: Pt able to correctly identifiy the number of fingers therapist held up except in the superior lateral field.  Cognition Overall Cognitive Status: Impaired/Different from baseline Arousal/Alertness: Awake/alert Orientation Level: Oriented to person;Oriented to place;Oriented to situation;Disoriented to time Attention: Sustained;Selective Focused Attention: Appears intact Sustained Attention: Appears intact Selective Attention: Appears intact Memory: Impaired Memory Impairment: Decreased recall of new information;Retrieval deficit;Other (comment) (working memory) Awareness: Impaired Awareness Impairment: Emergent impairment Problem Solving: Impaired Problem Solving Impairment: Functional complex Executive Function: Organizing;Self Monitoring;Self Correcting Organizing: Impaired Organizing Impairment: Functional complex Self Monitoring: Impaired Self Monitoring Impairment: Functional complex Self Correcting: Impaired Self Correcting Impairment: Functional  complex Safety/Judgment: Impaired Comments: Pt reports that he notices no deficits from the CVA even though he needed UE support with mobility to and from the bathroom. Sensation Sensation Light Touch: Appears Intact Stereognosis: Appears Intact Hot/Cold: Appears Intact Proprioception: Appears Intact Coordination Gross Motor Movements are Fluid and Coordinated: Yes (movements in the UE are WFLs during selfcare tasks) Fine Motor Movements are Fluid and Coordinated: Yes Motor  Motor Motor: Abnormal postural alignment and control Motor - Skilled Clinical Observations: Pt with good UE use with selfcare tasks, does sit in a posterior pelvic tilt. Mobility  Bed Mobility Bed Mobility: Rolling Right;Right Sidelying to Sit Rolling Right: 4: Min assist Right Sidelying to Sit: 4: Min assist;HOB flat Right Sidelying to Sit Details: Manual facilitation for weight shifting Right Sidelying to Sit Details (indicate cue type  and reason): Pt needed min assist to transition to sitting from sidelying.  Trunk/Postural Assessment  Cervical Assessment Cervical Assessment: Within Functional Limits Thoracic Assessment Thoracic Assessment: Within Functional Limits Lumbar Assessment Lumbar Assessment: Within Functional Limits Postural Control Postural Control: Deficits on evaluation Postural Limitations: Pt sits in a posterior pelvic tilt.   Balance Balance Balance Assessed: Yes Dynamic Sitting Balance Dynamic Sitting - Balance Support: No upper extremity supported Dynamic Sitting - Level of Assistance: 5: Stand by assistance Static Standing Balance Static Standing - Balance Support: No upper extremity supported Static Standing - Level of Assistance: 4: Min assist Dynamic Standing Balance Dynamic Standing - Balance Support: Right upper extremity supported;Left upper extremity supported Dynamic Standing - Level of Assistance: 4: Min assist;3: Mod assist Extremity/Trunk Assessment RUE  Assessment RUE Assessment: Within Functional Limits LUE Assessment LUE Assessment: Within Functional Limits  FIM:  FIM - Eating Eating Activity: 6: More than reasonable amount of time FIM - Grooming Grooming Steps: Wash, rinse, dry face;Wash, rinse, dry hands;Brush, comb hair Grooming: 4: Patient completes 3 of 4 or 4 of 5 steps FIM - Bathing Bathing Steps Patient Completed: Chest;Right Arm;Left Arm;Abdomen;Front perineal area;Buttocks;Right upper leg;Left upper leg;Left lower leg (including foot);Right lower leg (including foot) Bathing: 4: Min-Patient completes 8-9 1f10 parts or 75+ percent FIM - Upper Body Dressing/Undressing Upper body dressing/undressing steps patient completed: Thread/unthread right sleeve of pullover shirt/dresss;Thread/unthread left sleeve of pullover shirt/dress;Put head through opening of pull over shirt/dress;Pull shirt over trunk Upper body dressing/undressing: 5: Supervision: Safety issues/verbal cues FIM - Lower Body Dressing/Undressing Lower body dressing/undressing steps patient completed: Thread/unthread right underwear leg;Thread/unthread left underwear leg;Thread/unthread right pants leg;Pull underwear up/down;Thread/unthread left pants leg;Don/Doff right shoe;Don/Doff left shoe;Fasten/unfasten right shoe;Fasten/unfasten left shoe;Don/Doff right sock;Don/Doff left sock;Pull pants up/down Lower body dressing/undressing: 4: Min-Patient completed 75 plus % of tasks FIM - Toileting Toileting steps completed by patient: Adjust clothing prior to toileting;Performs perineal hygiene;Adjust clothing after toileting Toileting: 4: Steadying assist FIM - Bed/Chair Transfer Bed/Chair Transfer: 4: Supine > Sit: Min A (steadying Pt. > 75%/lift 1 leg);4: Bed > Chair or W/C: Min A (steadying Pt. > 75%) FIM - TRadio producerDevices: Elevated toilet seat;Grab bars Toilet Transfers: 4-To toilet/BSC: Min A (steadying Pt. > 75%);4-From  toilet/BSC: Min A (steadying Pt. > 75%) FIM - Tub/Shower Transfers Tub/shower Transfers: 0-Activity did not occur or was simulated   Refer to Care Plan for Long Term Goals  Recommendations for other services: None  Discharge Criteria: Patient will be discharged from OT if patient refuses treatment 3 consecutive times without medical reason, if treatment goals not met, if there is a change in medical status, if patient makes no progress towards goals or if patient is discharged from hospital.  The above assessment, treatment plan, treatment alternatives and goals were discussed and mutually agreed upon: by patient   Pt performed bathing and dressing sit to stand on the EOB during session.  Overall reports no difference in his functional level for selfcare tasks and mobility even though he presents at a min assist level for transfers and walking.  Sequences through bathing and dressing with only min instructional cueing.  Did note left visual field deficit however with further testing.  He was able to verbalize correct procedure for requesting assistance if needing to get up to got to the bathroom or get back in bed.    Chariah Bailey OTR/L 12/21/2013, 1:29 PM

## 2013-12-21 NOTE — Evaluation (Signed)
Physical Therapy Assessment and Plan  Patient Details  Name: Brian Wolf MRN: [id number] Date of Birth: Aug 08, 1931  PT Diagnosis: Abnormal posture, Abnormality of gait, Impaired cognition, Muscle weakness and Pain in left foot Rehab Potential: Excellent ELOS: 5-7 days   Today's Date: 12/21/2013 PT Individual Time: 1300-1415 PT Individual Time Calculation (min): 75 min    Problem List:  Patient Active Problem List   Diagnosis Date Noted  . Gout flare 12/20/2013  . Benign essential HTN 12/20/2013  . Acute hemorrhagic infarction of brain 12/20/2013  . Hemorrhagic stroke   . Other secondary acute gout of left foot   . Hypokalemia 12/19/2013  . Altered mental status   . Encounter for central line placement   . Encephalopathy acute   . ICH (intracerebral hemorrhage) 12/14/2013  . Fever     Past Medical History:  Past Medical History  Diagnosis Date  . Hypertension    Past Surgical History:  Past Surgical History  Procedure Laterality Date  . Abdominal surgery      Assessment & Plan Clinical Impression: [name] is a 80 y.o. LH- male who was noted on 12/13/13 by wife that he was acting strange. He was complaining of a right temporal pain when he coughed and seemed off balance. She noted he went out to the drive way and seemed to be walking to the right and then was not talking sensibly. Wife also noted some weakness in his left hand. On 11/13 he was sent from PCP office to ED for evaluation. On arrival to ED initial CT scan showed a large right acute hemorraghic infarct. 2D echo with EF 55-60% with no regional wall abnormality. Patient with lethargy as well as apneic episodes on 11/14 and repeat CT head was with evolving right parietal IPH with mild shift and mild right lateral ventricle effacement and was started on hypertonic saline for edema. Patient with fever due to enterobacter UTI and was started on cipro for treatment. MRI/MRA brain with Large RIGHT  temporal occipital lobar hemorrhage with surrounding edema and subcentimeter of acute infarct right lateral temporal lobe. Mentation has improved and patient taken off bedrest 11/16 with initiation of therapy. He was started on regular diet and cognitive evaluation with executive deficits with complex functional tasks. He has had complaints of bilateral foot pain due to gout flare and was started colchicine yesterday. Patient with resultant ataxic gait, left inattention due to visual field deficits as well as intermittent confusion with poor safety awareness as well as lack of awareness of deficits. Patient transferred to CIR on 12/20/2013 .   Patient currently requires min with mobility secondary to muscle weakness, decreased cardiorespiratoy endurance, and field cut.  Prior to hospitalization, patient was independent  with mobility and lived with Spouse (Simultaneous filing. User may not have seen previous data.) in a House (Simultaneous filing. User may not have seen previous data.) home.  Home access is 16 indoor steps from garage (Simultaneous filing. User may not have seen previous data.)Stairs to enter (Simultaneous filing. User may not have seen previous data.).  Patient will benefit from skilled PT intervention to maximize safe functional mobility, minimize fall risk and decrease caregiver burden for planned discharge home with intermittent assist.  Anticipate patient will benefit from follow up [facility] versus OP PT at discharge.  PT - End of Session Activity Tolerance: Tolerates 30+ min activity with multiple rests;Decreased this session Endurance Deficit: Yes Endurance Deficit Description: SOB noted following ambulation PT Assessment Rehab Potential (ACUTE/IP ONLY):  Excellent Barriers to Discharge: Inaccessible home environment Barriers to Discharge Comments: 16 stairs with 2 rails to enter from garage PT Patient demonstrates impairments in the following area(s):  Balance;Behavior;Motor;Safety;Pain;Endurance;Perception PT Transfers Functional Problem(s): Bed Mobility;Bed to Chair;Car;Furniture;Floor PT Locomotion Functional Problem(s): Ambulation;Wheelchair Mobility;Stairs PT Plan PT Intensity: Minimum of 1-2 x/day ,45 to 90 minutes PT Frequency: 5 out of 7 days PT Duration Estimated Length of Stay: 5-7 days PT Treatment/Interventions: Ambulation/gait training;Balance/vestibular training;Cognitive remediation/compensation;Discharge planning;Disease management/prevention;DME/adaptive equipment instruction;Functional mobility training;Neuromuscular re-education;Pain management;Patient/family education;Stair training;Therapeutic Activities;Therapeutic Exercise;UE/LE Strength taining/ROM;UE/LE Coordination activities;Visual/perceptual remediation/compensation PT Transfers Anticipated Outcome(s): mod I PT Locomotion Anticipated Outcome(s): supervision PT Recommendation Follow Up Recommendations: Outpatient PT Patient destination: Home Equipment Details: pt has RW, may need to be assessed for appropriateness  Skilled Therapeutic Intervention Skilled therapeutic intervention initiated after completion of evaluation. Discussed with patient falls risk, safety within room, and focus of therapy during stay. Discussed possible LOS, goals, and f/u therapy with patient and son. Patient's son with questions regarding home modifications for stairs and home setup, education and recommendations based on photos of patient's house provided. Discussed barriers to home entry and therapist recommending that patient use garage entry with 16 steps (son to install second rail so patient will be able to reach both) and take standing rest breaks as needed at supervision level. With use of RW, pt progressed from min A with HHA to supervision level x 150 ft. Pt left sitting edge of bed in room with wife and son, NT present.  PT  Evaluation Precautions/Restrictions Precautions Precautions: Fall (Simultaneous filing. User may not have seen previous data.) Precaution Comments: L field deficit (Simultaneous filing. User may not have seen previous data.) Restrictions Weight Bearing Restrictions: No (Simultaneous filing. User may not have seen previous data.) General Chart Reviewed: Yes Family/Caregiver Present: Yes (Son Richardson Landry)  Pain Pain Assessment Pain Assessment: No/denies pain Pain Score: 0-No pain Home Living/Prior Functioning Home Living Living Arrangements: Spouse/significant other Available Help at Discharge: Family;Available 24 hours/day (Simultaneous filing. User may not have seen previous data.) Type of Home: House (Simultaneous filing. User may not have seen previous data.) Home Access: Stairs to enter (Simultaneous filing. User may not have seen previous data.) Entrance Stairs-Number of Steps: 16 indoor steps from garage (Simultaneous filing. User may not have seen previous data.) Entrance Stairs-Rails: Left (family to install 2 rails so he can reach both Simultaneous filing. User may not have seen previous data.) Home Layout: Two level;Laundry or work area in basement;Other (Comment) (studio in basement Simultaneous filing. User may not have seen previous data.) Additional Comments: son plans to make modifications with grab bars and elevated commode as needed (Simultaneous filing. User may not have seen previous data.)  Lives With: Spouse (Simultaneous filing. User may not have seen previous data.) Prior Function Level of Independence: Independent with basic ADLs;Independent with gait;Independent with transfers (Simultaneous filing. User may not have seen previous data.)  Able to Take Stairs?: Yes (Simultaneous filing. User may not have seen previous data.) Driving: Yes (rarely Simultaneous filing. User may not have seen previous data.) Vocation: Other (comment) Chief Financial Officer, Games developer in a Doctor, hospital. 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 User may not have seen previous data.) Vision/Perception  Vision - Assessment Eye Alignment: Impaired (comment) (left eye lid with ptosis and sits inferior compared to the right eye) Ocular Range of Motion: Restricted on the left;Impaired-to be further tested in functional context;Restricted looking up (Pt with decreased ability to look  up and laterally and maintain gaze with the left eye) Alignment/Gaze Preference: Within Defined Limits Tracking/Visual Pursuits: Decreased smoothness of eye movement to LEFT superior field Saccades: Impaired - to be further tested in functional context Convergence: Within functional limits Additional Comments: Pt able to correctly identifiy the number of fingers therapist held up except in the superior lateral field.  Cognition Overall Cognitive Status: Impaired/Different from baseline Arousal/Alertness: Awake/alert Orientation Level: Oriented to person;Oriented to place;Oriented to situation;Disoriented to time Attention: Sustained;Selective Focused Attention: Appears intact Sustained Attention: Appears intact Selective Attention: Appears intact Memory: Impaired Memory Impairment: Decreased recall of new information;Retrieval deficit;Other (comment) (working memory) Awareness: Impaired Awareness Impairment: Emergent impairment Problem Solving: Impaired Problem Solving Impairment: Functional complex Executive Function: Organizing;Self Monitoring;Self Correcting Organizing: Impaired Organizing Impairment: Functional complex Self Monitoring: Impaired Self Monitoring Impairment: Functional complex Self Correcting: Impaired Self Correcting Impairment: Functional complex Safety/Judgment: Impaired Comments: Pt reports that he notices no deficits from the CVA even though he needed UE support with mobility to and from the  bathroom. Sensation Sensation Light Touch: Appears Intact Stereognosis: Appears Intact Hot/Cold: Appears Intact Proprioception: Appears Intact Coordination Gross Motor Movements are Fluid and Coordinated: Yes (movements in the UE are WFLs during selfcare tasks) Fine Motor Movements are Fluid and Coordinated: Yes Motor  Motor Motor: Abnormal postural alignment and control Motor - Skilled Clinical Observations: thoracic kyphosis and posterior pelvic tilt  Mobility Bed Mobility Bed Mobility: Supine to Sit;Sit to Supine Rolling Right: 4: Min assist Right Sidelying to Sit: 4: Min assist;HOB flat Right Sidelying to Sit Details: Manual facilitation for weight shifting Right Sidelying to Sit Details (indicate cue type and reason): Pt needed min assist to transition to sitting from sidelying. Supine to Sit: 5: Supervision Sit to Supine: 5: Supervision Transfers Transfers: Yes Stand Pivot Transfers: 4: Min assist Stand Pivot Transfer Details: Verbal cues for sequencing;Verbal cues for precautions/safety;Manual facilitation for weight shifting Stand Pivot Transfer Details (indicate cue type and reason): pt with weight shifted posterior in standing Locomotion  Ambulation Ambulation: Yes Ambulation/Gait Assistance: 5: Supervision;4: Min guard;4: Min assist Ambulation Distance (Feet): 150 Feet Assistive device: None;1 person hand held assist;Rolling walker Ambulation/Gait Assistance Details: Verbal cues for technique;Verbal cues for precautions/safety;Manual facilitation for weight shifting Gait Gait: Yes Gait Pattern: Impaired Gait Pattern: Step-through pattern;Decreased trunk rotation;Trunk flexed;Decreased stride length;Lateral trunk lean to right;Lateral trunk lean to left Gait velocity: decreased Stairs / Additional Locomotion Stairs: Yes Stairs Assistance: 4: Min guard;4: Min assist Stair Management Technique: Two rails;Alternating pattern;Forwards Number of Stairs: 5 Height of  Stairs: 6 Architect: Yes Wheelchair Assistance: 5: Investment banker, operational Details: Verbal cues for technique;Verbal cues for precautions/safety;Verbal cues for Astronomer: Both upper extremities Wheelchair Parts Management: Supervision/cueing Distance: 150  Trunk/Postural Assessment  Cervical Assessment Cervical Assessment: Within Functional Limits Thoracic Assessment Thoracic Assessment: Exceptions to [facility] (rounded shoulders) Lumbar Assessment Lumbar Assessment: Exceptions to WFL (kyphotic) Postural Control Postural Control: Deficits on evaluation Protective Responses: delayed Postural Limitations: Pt sits in a posterior pelvic tilt.   Balance Balance Balance Assessed: Yes Standardized Balance Assessment Standardized Balance Assessment: Berg Balance Test Berg Balance Test Sit to Stand: Able to stand  independently using hands Standing Unsupported: Able to stand 2 minutes with supervision Sitting with Back Unsupported but Feet Supported on Floor or Stool: Able to sit safely and securely 2 minutes Stand to Sit: Sits safely with minimal use of hands Transfers: Able to transfer safely, definite need of hands Standing Unsupported with Eyes Closed: Able to stand 10  seconds with supervision Standing Ubsupported with Feet Together: Needs help to attain position but able to stand for 30 seconds with feet together From Standing, Reach Forward with Outstretched Arm: Reaches forward but needs supervision From Standing Position, Pick up Object from Floor: Able to pick up shoe, needs supervision From Standing Position, Turn to Look Behind Over each Shoulder: Turn sideways only but maintains balance Turn 360 Degrees: Needs close supervision or verbal cueing Standing Unsupported, Alternately Place Feet on Step/Stool: Able to stand independently and complete 8 steps >20 seconds Standing Unsupported, One Foot in Front: Loses balance  while stepping or standing Standing on One Leg: Tries to lift leg/unable to hold 3 seconds but remains standing independently Total Score: 32 Dynamic Sitting Balance Dynamic Sitting - Balance Support: No upper extremity supported Dynamic Sitting - Level of Assistance: 5: Stand by assistance Static Standing Balance Static Standing - Balance Support: No upper extremity supported Static Standing - Level of Assistance: 4: Min assist Dynamic Standing Balance Dynamic Standing - Balance Support: Right upper extremity supported;Left upper extremity supported Dynamic Standing - Level of Assistance: 4: Min assist;3: Mod assist Extremity Assessment  RUE Assessment RUE Assessment: Within Functional Limits LUE Assessment LUE Assessment: Within Functional Limits RLE Assessment RLE Assessment: Within Functional Limits LLE Assessment LLE Assessment: Within Functional Limits (grossly 4+/5 throughout)  FIM:  FIM - Bed/Chair Transfer Bed/Chair Transfer: 5: Supine > Sit: Supervision (verbal cues/safety issues);5: Sit > Supine: Supervision (verbal cues/safety issues);4: Bed > Chair or W/C: Min A (steadying Pt. > 75%);4: Chair or W/C > Bed: Min A (steadying Pt. > 75%) FIM - Locomotion: Wheelchair Distance: 150 Locomotion: Wheelchair: 5: Travels 150 ft or more: maneuvers on rugs and over door sills with supervision, cueing or coaxing FIM - Locomotion: Ambulation Locomotion: Ambulation Assistive Devices: Other (comment);Walker - Rolling (HHA or no AD) Ambulation/Gait Assistance: 5: Supervision;4: Min guard;4: Min assist Locomotion: Ambulation: 4: Travels 150 ft or more with minimal assistance (Pt.>75%) FIM - Locomotion: Stairs Locomotion: Scientist, physiological: Hand rail - 2 Locomotion: Stairs: 2: Up and Down 4 - 11 stairs with minimal assistance (Pt.>75%)   Refer to Care Plan for Long Term Goals  Recommendations for other services: None  Discharge Criteria: Patient will be discharged from PT if  patient refuses treatment 3 consecutive times without medical reason, if treatment goals not met, if there is a change in medical status, if patient makes no progress towards goals or if patient is discharged from hospital.  The above assessment, treatment plan, treatment alternatives and goals were discussed and mutually agreed upon: by patient and by family  Laretta Alstrom 12/21/2013, 4:30 PM

## 2013-12-21 NOTE — Evaluation (Signed)
Speech Language Pathology Assessment and Plan  Patient Details  Name: Brian Wolf MRN: [id number] Date of Birth: 05/14/1931  SLP Diagnosis: Cognitive Impairments  Rehab Potential: Good ELOS: 7 days     Today's Date: 12/21/2013 SLP Individual Time: 0905-1005 SLP Individual Time Calculation (min): 60 min   Problem List:  Patient Active Problem List   Diagnosis Date Noted  . Gout flare 12/20/2013  . Benign essential HTN 12/20/2013  . Acute hemorrhagic infarction of brain 12/20/2013  . Hemorrhagic stroke   . Other secondary acute gout of left foot   . Hypokalemia 12/19/2013  . Altered mental status   . Encounter for central line placement   . Encephalopathy acute   . ICH (intracerebral hemorrhage) 12/14/2013  . Fever    Past Medical History:  Past Medical History  Diagnosis Date  . Hypertension    Past Surgical History:  Past Surgical History  Procedure Laterality Date  . Abdominal surgery      Assessment / Plan / Recommendation Clinical Impression   Brian Wolf is an 80 y.o. LH-male who was noted on 12/13/13 by wife that he was acting strange with complaint of right temporal pain when he coughed and decreased balance. She reported that he went out to the driveway and seemed to be walking to the right and then was not talking sensibly.  Wife also noted some weakness in his left hand.  On 11/13 he was sent from PCP office to ED for evaluation.  On arrival to ED initial CT scan showed a large right acute hemorraghic infarct. Repeat CT head was with evolving right parietal IPH with mild shift and mild right lateral ventricle effacement. Patient with fever due to enterobacter UTI and was started on cipro for treatment.  MRI/MRA  brain with Large RIGHT temporal occipital lobar hemorrhage with surrounding edema and subcentimeter of acute infarct right lateral temporal lobe. Mentation has improved and patient taken off bedrest 11/16 with initiation of therapy. He was  started on regular diet and cognitive evaluation completed with executive deficits noted with complex functional tasks. Patient with resultant ataxic gait, left inattention due to visual field deficits as well as intermittent confusion with poor safety awareness as well as lack of awareness of deficits. CIR was recommended by rehab team and MD. Work up complete and subsequently patient admitted 12/20/2013.  SLP evaluation completed on 12/21/2013 with the following results: Pt presents with mild-moderate cognitive deficits characterized by decreased working memory which impacts his executive function for organization and error awareness during structured tasks. Additionally, pt presents with poor emergent awareness into his acute cognitive and physical deficits occurring as a result of his stroke.  Pt also exhibits a left inattention which will likely impact his functional safety awareness during higher level tasks.  Pt would benefit from skilled ST while inpatient in order to maximize functional independence and reduce burden of care upon discharge.  SLP anticipates that pt will require either home health or outpatient follow up ST, 24/7 supervision, and assistance for medication and financial management at discharge.    Skilled Therapeutic Interventions          Cognitive-linguistic evaluation completed with results and recommendations reviewed with patient.     SLP Assessment  Patient will need skilled [facility] during CIR admission    Recommendations  Patient destination: Home Follow up Recommendations: 24 hour supervision/assistance;Home Health SLP Equipment Recommended: None recommended by SLP    SLP Frequency 5 out of 7 days  SLP Treatment/Interventions Cognitive remediation/compensation;Cueing hierarchy;Functional tasks;Patient/family education;Internal/external aids;Environmental controls    Pain Pain Assessment Pain Assessment: No/denies pain Prior  Functioning Cognitive/Linguistic Baseline: Within functional limits Type of Home: House  Lives With: Spouse Available Help at Discharge: Family;Available 24 hours/day Education: Master's degree in Art per pt  Vocation: Other (comment) Chief Financial Officer, 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  The above assessment, treatment plan, treatment alternatives and goals were discussed and mutually agreed upon: by patient   Brian Wolf, M.A. CCC-SLP  Brian Wolf, Brian Wolf 12/21/2013, 11:42 AM

## 2013-12-21 NOTE — IPOC Note (Signed)
Overall Plan of Care [facility]) Patient Details Name: Brian Wolf MRN: [id number] DOB: Dec 01, 1931  Admitting Diagnosis: CVA  Hospital Problems: Principal Problem:   ICH (intracerebral hemorrhage) Active Problems:   Gout flare   Benign essential HTN   Other secondary acute gout of left foot   Acute hemorrhagic infarction of brain     Functional Problem List: Nursing Perception, Safety, Sensory, Endurance, Medication Management, Motor  PT Balance, Behavior, Motor, Safety, Pain, Endurance, Perception  OT Balance, Cognition, Endurance, Safety, Vision  SLP Cognition, Safety  TR         Basic ADL's: OT Grooming, Bathing, Dressing, Toileting     Advanced  ADL's: OT Simple Meal Preparation     Transfers: PT Bed Mobility, Bed to Chair, Car, Furniture, Futures trader, Metallurgist: PT Ambulation, Emergency planning/management officer, Stairs     Additional Impairments: OT    SLP Social Cognition   Problem Solving, Memory, Awareness  TR      Anticipated Outcomes Item Anticipated Outcome  Self Feeding independent  Swallowing      Basic self-care  mmodified independent  Toileting  modified independent   Bathroom Transfers modified independent  Bowel/Bladder  n/a  Transfers  mod I  Locomotion  supervision  Communication     Cognition  supervision   Pain  colchicine for gout flare rt great toe; pain controlled with prn medication at or below level 5  Safety/Judgment  maintain safety with level 5 assist/cues   Therapy Plan: PT Intensity: Minimum of 1-2 x/day ,45 to 90 minutes PT Frequency: 5 out of 7 days PT Duration Estimated Length of Stay: 5-7 days OT Intensity: Minimum of 1-2 x/day, 45 to 90 minutes OT Frequency: 5 out of 7 days OT Duration/Estimated Length of Stay: 7-9 days SLP Intensity: Minumum of 1-2 x/day, 30 to 90 minutes SLP Frequency: 5 out of 7 days SLP Duration/Estimated Length of Stay: 7 days        Team Interventions: Nursing  Interventions Patient/Family Education, Discharge Planning, Medication Management, Psychosocial Support, Disease Management/Prevention, Cognitive Remediation/Compensation  PT interventions Ambulation/gait training, Balance/vestibular training, Cognitive remediation/compensation, Discharge planning, Disease management/prevention, DME/adaptive equipment instruction, Functional mobility training, Neuromuscular re-education, Pain management, Patient/family education, Stair training, Therapeutic Activities, Therapeutic Exercise, UE/LE Strength taining/ROM, UE/LE Coordination activities, Visual/perceptual remediation/compensation  OT Interventions Balance/vestibular training, Cognitive remediation/compensation, Community reintegration, Discharge planning, Functional mobility training, DME/adaptive equipment instruction, Disease mangement/prevention, Neuromuscular re-education, Self Care/advanced ADL retraining, Patient/family education, Therapeutic Activities, UE/LE Coordination activities, UE/LE Strength taining/ROM, Therapeutic Exercise, Psychosocial support  SLP Interventions Cognitive remediation/compensation, Cueing hierarchy, Functional tasks, Patient/family education, Internal/external aids, Environmental controls  TR Interventions    SW/CM Interventions Discharge Planning, Psychosocial Support, Patient/Family Education    Team Discharge Planning: Destination: PT-Home ,OT- Home , SLP-Home Projected Follow-up: PT-Outpatient PT, OT-  Outpatient OT, SLP-24 hour supervision/assistance, Home Health SLP Projected Equipment Needs: PT- , OT- Tub/shower seat, SLP-None recommended by SLP Equipment Details: PT-pt has RW, may need to be assessed for appropriateness, OT-  Patient/family involved in discharge planning: PT- Patient, Family member/caregiver,  OT-Patient, SLP-Patient  MD ELOS: 6-9 days Medical Rehab Prognosis:  Excellent Assessment: 80 y.o. LH- male who was noted on 12/13/13 by wife that he was  acting strange. He was complaining of a right temporal pain when he coughed and seemed off balance. She noted he went out to the drive way and seemed to be walking to the right and then was not talking sensibly.  Wife also noted some  weakness in his left hand.  On 11/13 he was sent from PCP office to ED for evaluation.  On arrival to ED initial CT scan showed a large right acute hemorraghic infarct. 2D echo with EF 55-60% with no regional wall abnormality.  Patient with lethargy as well as apneic episodes on 11/14 and repeat CT head was with evolving right parietal IPH with mild shift and mild right lateral ventricle effacement and was started on hypertonic saline for edema. Patient with fever due to enterobacter UTI and was started on cipro for treatment.  MRI/MRA  brain with Large RIGHT temporal occipital lobar hemorrhage with surrounding edema and subcentimeter of acute infarct right lateral temporal lobe   Now requiring 24/7 Rehab RN,MD, as well as CIR level PT, OT and SLP.  Treatment team will focus on ADLs and mobility with goals set at Mod I   See Team Conference Notes for weekly updates to the plan of care

## 2013-12-21 NOTE — Progress Notes (Signed)
Social Work Assessment and Plan Social Work Assessment and Plan  Patient Details  Name: Brian Wolf MRN: [id number] Date of Birth: Jan 01, 1932  Today's Date: 12/21/2013  Problem List:  Patient Active Problem List   Diagnosis Date Noted  . Gout flare 12/20/2013  . Benign essential HTN 12/20/2013  . Acute hemorrhagic infarction of brain 12/20/2013  . Hemorrhagic stroke   . Other secondary acute gout of left foot   . Hypokalemia 12/19/2013  . Altered mental status   . Encounter for central line placement   . Encephalopathy acute   . ICH (intracerebral hemorrhage) 12/14/2013  . Fever    Past Medical History:  Past Medical History  Diagnosis Date  . Hypertension    Past Surgical History:  Past Surgical History  Procedure Laterality Date  . Abdominal surgery     Social History:  reports that he has never smoked. He has never used smokeless tobacco. He reports that he does not drink alcohol. His drug history is not on file.  Family / Support Systems Marital Status: Married How Long?: 28 years Patient Roles: Spouse, Parent Spouse/Significant Other: [name]  [phone]  [phone] Children: [name]  [phone]  Here from [place] Other Supports: Friends Anticipated Caregiver: Wife Ability/Limitations of Caregiver: Wife is in good health and can provide assist Caregiver Availability: 24/7 Family Dynamics: Pt has been here for 65 years from [place], he met his wife here.  He had a previous marriage and has a son who is supportive and involved.  He is here from [place] and a ER-MD, very knowledgable regarding pt's condition.  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  He has always been independent and wants to remain so, this is what motivates him here. Recent Psychosocial Issues: Other health issues-were managed Pyschiatric History: No history deferred depression screening due to pt declined felt he is doing well and this is not necessary.  Will monitor and intervene if necessary Substance Abuse History: No issues  Patient / Family Perceptions, Expectations & Goals Pt/Family understanding of illness & functional limitations: Pt has a good understanding of his stroke and deficits.  He feels he has made progress and is encouraged by this.  He talks with MD daily while on his rounds and his son has been asking many questions, both feel their needs are being addressed. Premorbid pt/family roles/activities: Husband, Father, retiree professor, Health visitor, [employer] member, etc Anticipated changes in roles/activities/participation: resume Pt/family expectations/goals: Pt states: " I plan to be independent by the time I go home."  Wife states: " I hope he does well here but can help him if he lets me."  [employer]: None Premorbid Home Care/DME Agencies: None Transportation available at discharge: Wife Resource referrals recommended: Support group (specify)  Discharge Planning Living Arrangements: Spouse/significant other Support Systems: Spouse/significant other, Children, Other relatives, Water engineer, Social worker community Type of Residence: Private residence Insurance underwriter Resources: Multimedia programmer (specify) ([place]) Museum/gallery curator Resources: Sports administrator, Other (  Comment) Emergency planning/management officer) Financial Screen Referred: No Living Expenses: Own Money Management: Spouse, Patient Does the patient have any problems obtaining your medications?: No Home Management: Both he and wife Patient/Family Preliminary Plans: Return home with wife who can assist if needed.  Pt does not want her too and will try to be independent when he leaves here.  Team needs to work on higher level balance and do as much as he can for himself, due to he will at home anyway.  Await team's evaluations. Social Work Anticipated Follow Up Needs: HH/OP, Support Group  Clinical Impression Very pleasant gentleman who is very stubborn and will be independent from here, so as not to burden his wife.  His wife and son are supportive and involved and will assist him, but he does not want this. Pt is fairly high level and will be a short length of stay.  Will work with all on appropriate discharge needs.    [name] 12/21/2013, 2:08 PM

## 2013-12-21 NOTE — Plan of Care (Signed)
Problem: RH SAFETY Goal: RH STG ADHERE TO SAFETY PRECAUTIONS W/ASSISTANCE/DEVICE STG Adhere to Safety Precautions With cues Assistance/Device.  Outcome: Progressing Goal: RH STG DECREASED RISK OF FALL WITH ASSISTANCE STG Decreased Risk of Fall With cues/reminders Assistance.  Outcome: Progressing  Problem: RH PAIN MANAGEMENT Goal: RH STG PAIN MANAGED AT OR BELOW PT'S PAIN GOAL At or below level 5  Outcome: Progressing  Problem: RH KNOWLEDGE DEFICIT Goal: RH STG INCREASE KNOWLEDGE OF HYPERTENSION Patient will be able to explain rationale for [facility] diet and medications to help control blood pressure to avoid bleeding in the brain with cues/reminders/notes  Outcome: Progressing

## 2013-12-21 NOTE — Care Management Note (Signed)
[facility] Individual Statement of Services  Patient Name:  Brian Wolf  Date:  12/21/2013  Welcome to the [facility].  Our goal is to provide you with an individualized program based on your diagnosis and situation, designed to meet your specific needs.  With this comprehensive rehabilitation program, you will be expected to participate in at least 3 hours of rehabilitation therapies Monday-Friday, with modified therapy programming on the weekends.  Your rehabilitation program will include the following services:  Physical Therapy (PT), Occupational Therapy (OT), Speech Therapy (ST), 24 hour per day rehabilitation nursing, Case Management (Social Worker), Rehabilitation Medicine, Nutrition Services and Pharmacy Services  Weekly team conferences will be held on Wednesday to discuss your progress.  Your Social Worker will talk with you frequently to get your input and to update you on team discussions.  Team conferences with you and your family in attendance may also be held.  Expected length of stay: 7-9 days Overall anticipated outcome: mod/i-supervision level  Depending on your progress and recovery, your program may change. Your Social Worker will coordinate services and will keep you informed of any changes. Your Social Worker's name and contact numbers are listed  below.  The following services may also be recommended but are not provided by the [facility] will be made to provide these services after discharge if needed.  Arrangements include referral to agencies that provide these services.  Your insurance has been verified to be:  [employer] Your primary doctor is:  Gaynelle Arabian  Pertinent information will be shared with your doctor and your insurance company.  Social Worker:  Ovidio Kin, [place] or ([phone]  Information discussed with and copy given to patient by: [name], 12/21/2013, 1:52 PM

## 2013-12-21 NOTE — Progress Notes (Signed)
80 y.o. LH- male who was noted on 12/13/13 by wife that he was acting strange. He was complaining of a right temporal pain when he coughed and seemed off balance. She noted he went out to the drive way and seemed to be walking to the right and then was not talking sensibly. Wife also noted some weakness in his left hand. On 11/13 he was sent from PCP office to ED for evaluation. On arrival to ED initial CT scan showed a large right acute hemorraghic infarct. 2D echo with EF 55-60% with no regional wall abnormality. Patient with lethargy as well as apneic episodes on 11/14 and repeat CT head was with evolving right parietal IPH with mild shift and mild right lateral ventricle effacement Subjective/Complaints: No problems last noc, pt has CPAP at bedside but does not use, discussed importance in context of CVA risk Review of Systems - Negative except as above  Objective: Vital Signs: Blood pressure 171/57, pulse 64, temperature 98.8 F (37.1 C), temperature source Oral, resp. rate 17, SpO2 97 %. No results found. Results for orders placed or performed during the hospital encounter of 12/14/13 (from the past 72 hour(s))  Glucose, capillary     Status: Abnormal   Collection Time: 12/18/13  8:35 AM  Result Value Ref Range   Glucose-Capillary 139 (H) 70 - 99 mg/dL   Comment 1 Notify RN    Comment 2 Documented in Chart   Glucose, capillary     Status: Abnormal   Collection Time: 12/18/13 11:55 AM  Result Value Ref Range   Glucose-Capillary 118 (H) 70 - 99 mg/dL   Comment 1 Notify RN    Comment 2 Documented in Chart   Basic metabolic panel     Status: Abnormal   Collection Time: 12/19/13  7:44 AM  Result Value Ref Range   Sodium 130 (L) 137 - 147 mEq/L    Comment: DELTA CHECK NOTED   Potassium 3.0 (L) 3.7 - 5.3 mEq/L    Comment: HEMOLYSIS AT THIS LEVEL MAY AFFECT RESULT   Chloride 91 (L) 96 - 112 mEq/L   CO2 27 19 - 32 mEq/L   Glucose, Bld 112 (H) 70 - 99 mg/dL   BUN 13 6 - 23 mg/dL    Creatinine, Ser 0.82 0.50 - 1.35 mg/dL   Calcium 7.7 (L) 8.4 - 10.5 mg/dL   GFR calc non Af Amer 81 (L) >90 mL/min   GFR calc Af Amer >90 >90 mL/min    Comment: (NOTE) The eGFR has been calculated using the CKD EPI equation. This calculation has not been validated in all clinical situations. eGFR's persistently <90 mL/min signify possible Chronic Kidney Disease.    Anion gap 12 5 - 15  CBC     Status: Abnormal   Collection Time: 12/20/13  6:37 AM  Result Value Ref Range   WBC 13.9 (H) 4.0 - 10.5 K/uL   RBC 3.67 (L) 4.22 - 5.81 MIL/uL   Hemoglobin 10.9 (L) 13.0 - 17.0 g/dL   HCT 32.8 (L) 39.0 - 52.0 %   MCV 89.4 78.0 - 100.0 fL   MCH 29.7 26.0 - 34.0 pg   MCHC 33.2 30.0 - 36.0 g/dL   RDW 13.4 11.5 - 15.5 %   Platelets 269 150 - 400 K/uL  Basic metabolic panel     Status: Abnormal   Collection Time: 12/20/13  6:37 AM  Result Value Ref Range   Sodium 136 (L) 137 - 147 mEq/L   Potassium 3.5 (  L) 3.7 - 5.3 mEq/L   Chloride 94 (L) 96 - 112 mEq/L   CO2 28 19 - 32 mEq/L   Glucose, Bld 105 (H) 70 - 99 mg/dL   BUN 18 6 - 23 mg/dL   Creatinine, Ser 0.88 0.50 - 1.35 mg/dL   Calcium 8.1 (L) 8.4 - 10.5 mg/dL   GFR calc non Af Amer 79 (L) >90 mL/min   GFR calc Af Amer >90 >90 mL/min    Comment: (NOTE) The eGFR has been calculated using the CKD EPI equation. This calculation has not been validated in all clinical situations. eGFR's persistently <90 mL/min signify possible Chronic Kidney Disease.    Anion gap 14 5 - 15      Constitutional: He is oriented to person, place, and time. He appears well-developed and well-nourished.  HENT:  Head: Normocephalic and atraumatic.  Eyes: Conjunctivae are normal. Pupils are equal, round, and reactive to light.  Neck: Normal range of motion. Neck supple.  Cardiovascular: Normal rate and regular rhythm.  Respiratory: Effort normal and breath sounds normal. No respiratory distress. He has no wheezes.  GI: Soft. Bowel sounds are normal.   Musculoskeletal:  No pain in feet, no pain with UE and LE AROM Neurological: He is alert and oriented to person, place, and time.  Speech without dysarthria   Skin: Skin is warm and dry.  Psychiatric: He has a normal mood and affect. His behavior is normal. Thought content normal.  Motor strength is 5/5 bilateral deltoid, biceps, triceps, grip, hip flexors, knee extensors, ankle dorsi flexors and plantar flexors  Assessment/Plan: 1. Functional deficits secondary to Right temporal -occipital hemorrhagic infarct  which require 3+ hours per day of interdisciplinary therapy in a comprehensive inpatient rehab setting. Physiatrist is providing close team supervision and 24 hour management of active medical problems listed below. Physiatrist and rehab team continue to assess barriers to discharge/monitor patient progress toward functional and medical goals. FIM:       FIM - Toileting Toileting steps completed by patient: Performs perineal hygiene, Adjust clothing prior to toileting Toileting Assistive Devices: Grab bar or rail for support Toileting: 3: Mod-Patient completed 2 of 3 steps  FIM - Radio producer Devices: Recruitment consultant Transfers: 4-To toilet/BSC: Min A (steadying Pt. > 75%), 3-From toilet/BSC: Mod A (lift or lower assist)        Comprehension Comprehension Mode: Auditory Comprehension: 5-Understands basic 90% of the time/requires cueing < 10% of the time  Expression Expression Mode: Verbal Expression: 5-Expresses basic 90% of the time/requires cueing < 10% of the time.  Social Interaction Social Interaction: 4-Interacts appropriately 75 - 89% of the time - Needs redirection for appropriate language or to initiate interaction.  Problem Solving Problem Solving: 4-Solves basic 75 - 89% of the time/requires cueing 10 - 24% of the time  Memory Memory: 4-Recognizes or recalls 75 - 89% of the time/requires cueing 10 - 24% of the  time   Medical Problem List and Plan: 1. Functional deficits secondary to Right temporal -occipital hemorrhagic infarct 2. DVT Prophylaxis/Anticoagulation: Mechanical: Antiembolism stockings, knee (TED hose) Bilateral lower extremities Sequential compression devices, below knee Bilateral lower extremities 3. Pain Management: tylenol prn for headaches as well as foot pain.  4. Mood: Team to provide ego support and reinforcement. LCSW to follow for evaluation and support.  5. Neuropsych: This patient is is capable of making decisions on his own behalf. 6. Skin/Wound Care: Routine  7. Fluids/Electrolytes/Nutrition: Monitor I/O. Family providing food from  home to help with intake. Offer supplements as needed.  8. HTN: Will monitor every 8 hours. Avoid hypotension to allow adequate perfusion BP goal< 180. Continue HCTZ, Metoprolol and Cozaar.  9. Enterococcus UTI: Antibiotic changed to Levaquin D # 5/7 10. Hypokalemia: Due to diuretics as well as IVF. Continue supplement with caution as on Cozaar. Will recheck lytes in am.  11. Gout flare: Continue colchicine bid.  12. Reactive leucocytosis: Likely due to UTI. Resolving.  13. ABLA: Has had a drop in H/H from 13.5-->10.5. Will monitor for H/H with serial check as well as for any occult signs of bleeding. Check stool guaiacs.  14.  Sleep apnea, non compliant with CPAP, has unit at home which he doesn't use, discussed that noncompliance can increase stroke risk LOS (Days) 1 A FACE TO FACE EVALUATION WAS PERFORMED  KIRSTEINS,ANDREW E 12/21/2013, 6:54 AM

## 2013-12-22 ENCOUNTER — Inpatient Hospital Stay (HOSPITAL_COMMUNITY): Payer: Medicare Other | Admitting: Occupational Therapy

## 2013-12-22 ENCOUNTER — Inpatient Hospital Stay (HOSPITAL_COMMUNITY): Payer: Medicare Other | Admitting: *Deleted

## 2013-12-22 ENCOUNTER — Inpatient Hospital Stay (HOSPITAL_COMMUNITY): Payer: Medicare Other | Admitting: Speech Pathology

## 2013-12-22 ENCOUNTER — Encounter (HOSPITAL_COMMUNITY): Payer: Medicare Other | Admitting: Occupational Therapy

## 2013-12-22 DIAGNOSIS — I618 Other nontraumatic intracerebral hemorrhage: Secondary | ICD-10-CM

## 2013-12-22 LAB — CULTURE, BLOOD (ROUTINE X 2)
Culture: NO GROWTH
Culture: NO GROWTH

## 2013-12-22 NOTE — Plan of Care (Signed)
Problem: RH SAFETY Goal: RH STG ADHERE TO SAFETY PRECAUTIONS W/ASSISTANCE/DEVICE STG Adhere to Safety Precautions With cues Assistance/Device.  Outcome: Progressing Goal: RH STG DECREASED RISK OF FALL WITH ASSISTANCE STG Decreased Risk of Fall With cues/reminders Assistance.  Outcome: Progressing  Problem: RH PAIN MANAGEMENT Goal: RH STG PAIN MANAGED AT OR BELOW PT'S PAIN GOAL At or below level 5  Outcome: Progressing

## 2013-12-22 NOTE — Progress Notes (Signed)
Speech Language Pathology Daily Session Note  Patient Details  Name: Brian Wolf MRN: [id number] Date of Birth: 25-Jan-1932  Today's Date: 12/22/2013 SLP Individual Time: [id number] SLP Individual Time Calculation (min): 53 min  Short Term Goals: Week 1: SLP Short Term Goal 1 (Week 1): STG=LTG due to LOS  Skilled Therapeutic Interventions:  Pt was seen for skilled ST targeting cognitive goals.  Upon arrival, pt was seated upright in recliner, awake, alert, and agreeable to participate in [employer].  Pt's son was present for the duration of today's therapy session and remained actively engaged in all therapeutic activities.  Pt's son also verified pt's report of baseline function and reported that pt's cognition remains slightly altered.  SLP provided skilled education related to current goals and progress in therapy and recommended that pt have assistance for medication and financial management at discharge.  SLP facilitated the session with a structured medication management task targeting use of compensatory strategies to improve recall of new, daily information.  Pt exhibited difficulty recalling function of new medications when named; however, he was able to identify his 4 previously scheduled blood pressure medications from prior to admission when named.  SLP provided pt with a handout of newly scheduled medications and used written aid to facilitate working memory when loading a pill box.  Pt required overall mod assist for planning, organization, and error awareness to load 1 BID medication into a pill box.  SLP ended task due to time and hid the medication list in a pt selected location in his room to target delayed recall of information at next available appointment.   SLP will continue to address executive function for medication management in future therapy sessions.  Pt is making good progress towards meeting goals.  Continue per current plan of care.    FIM:  Comprehension Comprehension  Mode: Auditory Comprehension: 5-Follows basic conversation/direction: With extra time/assistive device Expression Expression Mode: Verbal Expression: 5-Expresses basic needs/ideas: With extra time/assistive device Social Interaction Social Interaction: 5-Interacts appropriately 90% of the time - Needs monitoring or encouragement for participation or interaction. Problem Solving Problem Solving: 4-Solves basic 75 - 89% of the time/requires cueing 10 - 24% of the time Memory Memory: 4-Recognizes or recalls 75 - 89% of the time/requires cueing 10 - 24% of the time  Pain Pain Assessment Pain Assessment: No/denies pain  Therapy/Group: Individual Therapy  [name], Selinda Orion 12/22/2013, 2:46 PM

## 2013-12-22 NOTE — Progress Notes (Signed)
Occupational Therapy Session Note  Patient Details  Name: Brian Wolf MRN: [id number] Date of Birth: 1932/01/25  Today's Date: 12/22/2013 OT Individual Time: [id number] Individual Time Calculation (min): 30 min    Skilled Therapeutic Interventions/Progress Updates:    Pt practiced walk-in shower transfers with use of the RW during session.  He was able to ambulate to and from the ADL apartment with min assist and no assistive device.  Noted LOB to the right when pt was asked to perform head turns when walking.  Increased lean to the left slightly with mobility but no LOB noted to this side.  Discussed with son positioning of grab bars in walk-in shower as well as shower seat.  Feel pt is best to step over posteriorly to get in the shower and then sit on seat.  Pt's son reports that walk-in shower is leaking and will need to be repaired so he will most likely need tub bench for use in the tub/shower room.  Will practice with tub bench transfer later this week.   Therapy Documentation Precautions:  Precautions Precautions: Fall Precaution Comments: L field deficit Restrictions Weight Bearing Restrictions: No  Pain: Pain Assessment Pain Assessment: No/denies pain ADL: See FIM for current functional status  Therapy/Group: Individual Therapy  Zaydin Billey OTR/L 12/22/2013, 4:16 PM

## 2013-12-22 NOTE — Progress Notes (Signed)
  80 y.o. LH- male who was noted on 12/13/13 by wife that he was acting strange. He was complaining of a right temporal pain when he coughed and seemed off balance. She noted he went out to the drive way and seemed to be walking to the right and then was not talking sensibly. Wife also noted some weakness in his left hand. On 11/13 he was sent from PCP office to ED for evaluation. On arrival to ED initial CT scan showed a large right acute hemorraghic infarct. 2D echo with EF 55-60% with no regional wall abnormality. Patient with lethargy as well as apneic episodes on 11/14 and repeat CT head was with evolving right parietal IPH with mild shift and mild right lateral ventricle effacement Subjective/Complaints: Patient denies complaints. He's feeling well. He is encouraged with therapy.  Objective: Vital Signs: Blood pressure 162/65, pulse 64, temperature 98.4 F (36.9 C), temperature source Oral, resp. rate 17, SpO2 98 %.   Elderly male in no acute distress. Chest clear to auscultation. Cardiac exam S1 and S2 are regular. Abdominal exam active bowel sounds, soft. Extremities trace edema pretibially.  Assessment/Plan: 1. Functional deficits secondary to Right temporal -occipital hemorrhagic infarct    Medical Problem List and Plan: 1. Functional deficits secondary to Right temporal -occipital hemorrhagic infarct 2. DVT Prophylaxis/Anticoagulation: Mechanical: Antiembolism stockings, knee (TED hose) Bilateral lower extremities Sequential compression devices, below knee Bilateral lower extremities 3. Pain Management: Patient denies pain. 4. Mood: Team to provide ego support and reinforcement. LCSW to follow for evaluation and support.  5. Neuropsych: This patient is is capable of making decisions on his own behalf. 6. Skin/Wound Care: No known skin breakdown. 7. Fluids/Electrolytes/Nutrition: Monitor I/O. Family providing food from home to help with intake. Offer supplements as needed.   8. HTN:  Will continue to follow. Blood pressures range 127-162/65. 9. Enterococcus UTI: Antibiotic changed to Levaquin -7 days of antibiotics to be completed tomorrow. 10. Hypokalemia: Resolved. Last potassium 4.3 mEq per liter 11. Gout flare: Resolved 12. Reactive leucocytosis:  13. ABLA: During the hospitalization hemoglobin has fallen from 13.5-10.1. Has improved back to 11.1. 14.  Sleep apnea, non compliant with CPAP, LOS (Days) 2 A [place] 12/22/2013, 8:55 AM

## 2013-12-22 NOTE — Progress Notes (Signed)
Occupational Therapy Session Note  Patient Details  Name: Brian Wolf MRN: [id number] Date of Birth: Sep 02, 1931  Today's Date: 12/22/2013 OT Individual Time: 0800-0900 OT Individual Time Calculation (min): 60 min    Short Term Goals: Week 1:  OT Short Term Goal 1 (Week 1): STGs equal to LTGs set at supervision to modified independent  Skilled Therapeutic Interventions/Progress Updates:    Pt ambulated to the bathroom and the shower with min assist this session.  He was able to perform all bathing with supervision and min instructional cueing to sequence.   Increased time needed for all tasks secondary to fatigue and slower cognitive processing.  Pt needed max demonstrational cueing when donning pullover shirt as he was attempting to initially donn it like a button up shirt.  Once corrected he still donned the shirt backwards without any awareness of its orientation.  He was able to donn his brief and button up pants with supervision and increased time to locate them on the left side of his bed.  When therapist asked him to state the time with the clock located on the left side of the room he was unable to determine.  Therapist brought it to midline as well and pt stated " Ten minutes till 6:00" even though it was almost 9:00 am.  Discussed visual perceptual issues related to dressing and clock recognition with his son.     Therapy Documentation Precautions:  Precautions Precautions: Fall Precaution Comments: L field deficit Restrictions Weight Bearing Restrictions: No (Simultaneous filing. User may not have seen previous data.)  Pain: Pain Assessment Pain Assessment: No/denies pain ADL: See FIM for current functional status  Therapy/Group: Individual Therapy  Nasya Vincent OTR/L 12/22/2013, 12:16 PM

## 2013-12-22 NOTE — Progress Notes (Signed)
Hypoglycemic Event  CBG: 67  Treatment: 15 GM carbohydrate snack  Symptoms: None  Follow-up CBG: [id number] CBG Result:102  Possible Reasons for Event: Inadequate meal intake  Comments/MD notified:Dr Letha Cape, Edmonia James  Remember to initiate Hypoglycemia Order Set & complete

## 2013-12-22 NOTE — Progress Notes (Signed)
Physical Therapy Session Note  Patient Details  Name: Lenora Mimms MRN: [id number] Date of Birth: [birth year]  Today's Date: 12/22/2013 PT Individual Time: 1335-1425 PT Individual Time Calculation (min): 50 min   Short Term Goals: Week 1:  PT Short Term Goal 1 (Week 1): = LTGs due to anticipated LOS  Skilled Therapeutic Interventions/Progress Updates:  Tx focused on functional mobility, family training, gait, and NMR via forced use, manual facilitation, and verbal cues.  Pt resting in WC, needing to use bathroom. Educated son on optimal guarding and positioning to assist pt to bathroom, which he was able to return demo safely, and was checked off to assist pt in room, and later for walking on unit as well. Son demonstrates high level of knowledge of safety and mobility from being an ER doctor, adaptive sport training, and assisting mother after CVA. He is highly motivated, aware, and cautious, and wants to contribute to pt's downtime from therapy. He is cleared to assist pt with RW and gait belt in this controlled setting in non-challenging situations and was given a seated/supine HEP to assist as well.   Therapist instructed pt in gait with RW and with no device on unit including the following tasks with Min A overall:  - RW x150' with cues for posture and RW positioning/proximity - RW x150' with son guarding with safe technique - No device in controlled setting - no device plus challenges:  - side-stepping R/Lx25 with ball toss - retro walking x25' with ball toss - high knee marching x25'  Stair training x5 with bil rails and Min A with reciprocal pattern ascending, step-to descending.   Continued to discuss home access, hand rail heights, and suggestions for long-term planning.      Therapy Documentation Precautions:  Precautions Precautions: Fall Precaution Comments: L field deficit Restrictions Weight Bearing Restrictions: No (Simultaneous filing. User may not have seen  previous data.) General:   Vital Signs: Therapy Vitals Temp: 98.8 F (37.1 C) Temp Source: Oral Pulse Rate: 67 Resp: 20 BP: (!) 128/58 mmHg Patient Position (if appropriate): Sitting Oxygen Therapy SpO2: 99 % O2 Device: Not Delivered Pain: Pain Assessment Pain Assessment: No/denies pain    Locomotion : Ambulation Ambulation/Gait Assistance: 4: Min guard   See FIM for current functional status  Therapy/Group: Individual Therapy  Kennieth Rad, PT, DPT 12/22/2013, 4:04 PM

## 2013-12-22 NOTE — Plan of Care (Signed)
Problem: RH SAFETY Goal: RH STG ADHERE TO SAFETY PRECAUTIONS W/ASSISTANCE/DEVICE STG Adhere to Safety Precautions With cues Assistance/Device.  Outcome: Progressing Goal: RH STG DECREASED RISK OF FALL WITH ASSISTANCE STG Decreased Risk of Fall With cues/reminders Assistance.  Outcome: Progressing  Problem: RH PAIN MANAGEMENT Goal: RH STG PAIN MANAGED AT OR BELOW PT'S PAIN GOAL At or below level 5  Outcome: Progressing  Problem: RH KNOWLEDGE DEFICIT Goal: RH STG INCREASE KNOWLEDGE OF HYPERTENSION Patient will be able to explain rationale for [facility] diet and medications to help control blood pressure to avoid bleeding in the brain with cues/reminders/notes  Outcome: Progressing  Problem: RH Vision Goal: RH LTG Vision (Specify) Outcome: Progressing

## 2013-12-23 ENCOUNTER — Inpatient Hospital Stay (HOSPITAL_COMMUNITY): Payer: Medicare Other | Admitting: Physical Therapy

## 2013-12-23 ENCOUNTER — Encounter (HOSPITAL_COMMUNITY): Payer: Medicare Other | Admitting: Occupational Therapy

## 2013-12-23 ENCOUNTER — Inpatient Hospital Stay (HOSPITAL_COMMUNITY): Payer: Medicare Other | Admitting: *Deleted

## 2013-12-23 NOTE — Progress Notes (Signed)
   Subjective/Complaints: Patient feels well. He has no complaints. He is sitting up in his bed.  Objective: Vital Signs: Blood pressure 145/56, pulse 63, temperature 98.3 F (36.8 C), temperature source Oral, resp. rate 18, SpO2 94 %.   Elderly male. No acute distress. Chest clear to auscultation. Cardiac exam S1 and S2 are regular. Abdominal exam active bowel sounds, soft. Extremities no edema.  Assessment/Plan: 1. Functional deficits secondary to Right temporal -occipital hemorrhagic infarct    Medical Problem List and Plan: 1. Functional deficits secondary to Right temporal -occipital hemorrhagic infarct 2. DVT Prophylaxis/Anticoagulation: Mechanical: Antiembolism stockings, knee (TED hose) Bilateral lower extremities Sequential compression devices, below knee Bilateral lower extremities 3. Pain Management: Patient denies pain. 4. Mood: Team to provide ego support and reinforcement. LCSW to follow for evaluation and support.  5. Neuropsych: This patient is is capable of making decisions on his own behalf. 6. Skin/Wound Care: No known skin breakdown. 7. Fluids/Electrolytes/Nutrition: Monitor I/O. Family providing food from home to help with intake. Offer supplements as needed.  8. HTN:  Will continue to follow. Blood pressures range 127-162/65. 9. Enterococcus UTI: Antibiotic changed to Levaquin -7 days of antibiotics to be completed 12/23/2013 10. Hypokalemia: Resolved. Last potassium 4.3 mEq per liter 11. Gout flare: Resolved 12. Reactive leucocytosis:  Lab Results  Component Value Date   WBC 13.4* 12/21/2013    13. ABLA: During the hospitalization hemoglobin has fallen from 13.5-10.1. Has improved back to 11.1. 14.  Sleep apnea, non compliant with CPAP, LOS (Days) 3 A FACE TO FACE EVALUATION WAS PERFORMED  SWORDS,BRUCE HENRY 12/23/2013, 8:14 AM

## 2013-12-23 NOTE — Progress Notes (Signed)
Occupational Therapy Session Note  Patient Details  Name: Brian Wolf Companion MRN: [id number] Date of Birth: December 21, 1931  Today's Date: 12/23/2013 OTgroup Time:  - [id number]  (57 min)      Short Term Goals: Week 1:  OT Short Term Goal 1 (Week 1): STGs equal to LTGs set at supervision to modified independent  Skilled Therapeutic Interventions/Progress Updates:    Pt. Ambulated from room to ADL apartment with SBA.  Wife and son present.  Practiced all functional transfers to tub shower bench, toilet, sofa, regular chair, bed.  Pt. Needed cues for hand placement in beginning but he recalled afterwards during remainder of transfers. Pt. Had one episode of dizziness with supine to sit EOB.   Pt.was SBA with transfers and minimal cueing.  Discussed bathroom adaptations and DME.  Grab bars are being installed.  Non skid rug and rubber mat for tub as well.  Pt. Ambulated back to room with mod cues to move down hall on left.   After 2 verbal cues he started going to the left.  Instructed pt on turning head to left when walking.  Pt. Fatigued at end of session and did not want to practice floor transfer.  Left pt with family in room and all needs in place.     Therapy Documentation Precautions:  Precautions Precautions: Fall Precaution Comments: L field deficit Restrictions Weight Bearing Restrictions: No    Pain: Pain Assessment Pain Assessment: No/denies pain         See FIM for current functional status  Therapy/Group: Individual Therapy  Lisa Roca 12/23/2013, 7:46 AM

## 2013-12-23 NOTE — Progress Notes (Signed)
Occupational Therapy Session Note  Patient Details  Name: Brian Wolf MRN: [id number] Date of Birth: February 06, 1931  Today's Date: 12/23/2013 OT Individual Time: 1000-1045 OT Individual Time Calculation (min): 45 min    Short Term Goals: Week 1:  OT Short Term Goal 1 (Week 1): STGs equal to LTGs set at supervision to modified independent  Skilled Therapeutic Interventions/Progress Updates:    Pt performed toileting, shower, and dressing during session.  He needed min coaxing to participate initially as he said he needed to rest a few mins.  Therapist reminded him that he had been resting all morning as this was his first therapy.  He finally agreed to get up from the bedside chair and ambulate to the toilet with min hand held assist.  Min assist for sit to stand from the lower toilet with use of the grab bar on the left side.  He ambulated over to the shower seat with min guard assist using the wall to steady himself.  Bathing sit to stand in the shower with supervision and min instructional cueing to sequence.  Transferred back out to the bedside chair with min assist as well to complete dressing.  Pt needed 3-4 minute rest break after donning underpants, pants, and shirt before attempting to donn TEDS and velcro shoes.  Therapist agreed to get him coffee if he finished his task, which he did.  When clock was presented at midline pt was able to estimate the time at 9:01 which was actually closer to the correct time which was 9:04.  Pt left sitting in bedside chair with coffee and call button within reach.   Therapy Documentation Precautions:  Precautions Precautions: Fall Precaution Comments: L field deficit, visual perceptual deficits Restrictions Weight Bearing Restrictions: No  Pain: Pain Assessment Pain Assessment: No/denies pain ADL: See FIM for current functional status  Therapy/Group: Individual Therapy  Berlie Persky OTR/L 12/23/2013, 12:29 PM

## 2013-12-23 NOTE — Progress Notes (Signed)
Pt. Was placed on his home CPAP of 7cm H2O via nasal pillows. Pt. Is tolerating CPAP well at this time.

## 2013-12-23 NOTE — Progress Notes (Signed)
Physical Therapy Session Note  Patient Details  Name: Mohit Claycamp MRN: [id number] Date of Birth: [birth year]  Today's Date: 12/23/2013 PT Individual Time: 1415-1510 PT Individual Time Calculation (min): 55 min   Short Term Goals: Week 1:  PT Short Term Goal 1 (Week 1): = LTGs due to anticipated LOS  Skilled Therapeutic Interventions/Progress Updates:   Pt received in recliner with wife present and son and daughter in law from [place] present.  Pt and family agreeable to family education.  Pt performed gait x 150' x 2 with RW and min A with intermittent cues for safety and attention to L environment. Began with simulated mid-size SUV training with pt performing stand pivot with RW with min A but able to sit on seat and place LE into/out of car with supervision and verbal cues for sequencing.  Transitioned to stairwell to practice flight of stairs to enter house from basement.  Wife reports they have multiple RW at home and they can have one on each level for pt.  Performed stair negotiation training up/down 12 stairs with 2 rails with min A with pt wearing gait belt per family's request with pt performing step to sequence.  Discussed set up and safety and wife's placement on stairs when assisting.  Wife gave repeat demonstration of providing min A while pt performed stair negotiation up/down 12 stairs selecting step to and alternating sequence.  Pt did require multiple rest breaks due to fatigue and SOB. Returned to room where son assisted pt with going to the bathroom.  Discussed with son possible need to practice floor > furniture transfers incase of a fall at home to prevent wife from trying to lift the patient.  Son felt this would be a good idea to practice.  Pt progressing well towards goals.    Therapy Documentation Precautions:  Precautions Precautions: Fall Precaution Comments: L field deficit, visual perceptual deficits Restrictions Weight Bearing Restrictions: No Pain: Pain  Assessment Pain Assessment: No/denies pain Locomotion : Ambulation Ambulation/Gait Assistance: 4: Min guard   See FIM for current functional status  Therapy/Group: Individual Therapy  Raylene Everts [facility] 12/23/2013, 3:25 PM

## 2013-12-23 NOTE — Plan of Care (Signed)
Problem: RH SAFETY Goal: RH STG ADHERE TO SAFETY PRECAUTIONS W/ASSISTANCE/DEVICE STG Adhere to Safety Precautions With cues Assistance/Device.  Outcome: Progressing  Problem: RH PAIN MANAGEMENT Goal: RH STG PAIN MANAGED AT OR BELOW PT'S PAIN GOAL At or below level 5  Outcome: Progressing

## 2013-12-24 ENCOUNTER — Inpatient Hospital Stay (HOSPITAL_COMMUNITY): Payer: Medicare Other | Admitting: Speech Pathology

## 2013-12-24 ENCOUNTER — Inpatient Hospital Stay (HOSPITAL_COMMUNITY): Payer: Medicare Other | Admitting: Physical Therapy

## 2013-12-24 ENCOUNTER — Encounter (HOSPITAL_COMMUNITY): Payer: Medicare Other

## 2013-12-24 DIAGNOSIS — H53469 Homonymous bilateral field defects, unspecified side: Secondary | ICD-10-CM

## 2013-12-24 DIAGNOSIS — I69398 Other sequelae of cerebral infarction: Secondary | ICD-10-CM

## 2013-12-24 DIAGNOSIS — R414 Neurologic neglect syndrome: Secondary | ICD-10-CM

## 2013-12-24 DIAGNOSIS — H53462 Homonymous bilateral field defects, left side: Secondary | ICD-10-CM

## 2013-12-24 MED ORDER — HYDROCHLOROTHIAZIDE 12.5 MG PO CAPS
37.5000 mg | ORAL_CAPSULE | Freq: Every day | ORAL | Status: DC
  Administered 2013-12-25 – 2013-12-26 (×2): 37.5 mg via ORAL
  Filled 2013-12-24 (×4): qty 3

## 2013-12-24 MED ORDER — POTASSIUM CHLORIDE CRYS ER 20 MEQ PO TBCR
20.0000 meq | EXTENDED_RELEASE_TABLET | Freq: Every day | ORAL | Status: DC
  Administered 2013-12-25 – 2013-12-26 (×2): 20 meq via ORAL
  Filled 2013-12-24 (×2): qty 1

## 2013-12-24 NOTE — Progress Notes (Signed)
Occupational Therapy Session Note  Patient Details  Name: Brian Wolf MRN: [id number] Date of Birth: 01-May-1931  Today's Date: 12/24/2013 OT Individual Time: 1100-1200 OT Individual Time Calculation (min): 60 min    Short Term Goals: Week 1:  OT Short Term Goal 1 (Week 1): STGs equal to LTGs set at supervision to modified independent  Skilled Therapeutic Interventions/Progress Updates:    Pt engaged in BADL retraining including bathing at shower level and dressing with sit<>stand from seat.  Pt stated he was going to don same clothes he was currently wearing after he completed shower.  Pt amb with RW into bathroom to use toilet prior to shower.  Pt completed all bathing, toileting, and dressing tasks at supervision level.  Pt required multiple rest breaks throughout session.  Pt stood at sink to complete grooming tasks.  Focus on activity tolerance, dynamic standing balance, sit<>stand, functional amb with RW, and safety awareness.  Therapy Documentation Precautions:  Precautions Precautions: Fall Precaution Comments: L field deficit, visual perceptual deficits Restrictions Weight Bearing Restrictions: No  Pain: Pain Assessment Pain Assessment: No/denies pain  See FIM for current functional status  Therapy/Group: Individual Therapy  Leroy Libman 12/24/2013, 12:05 PM

## 2013-12-24 NOTE — Progress Notes (Signed)
Physical Therapy Session Note  Patient Details  Name: Brian Wolf MRN: [id number] Date of Birth: 25-Nov-1931  Today's Date: 12/24/2013 PT Individual Time: 0930-1030 PT Individual Time Calculation (min): 60 min   Short Term Goals: Week 1:  PT Short Term Goal 1 (Week 1): = LTGs due to anticipated LOS  Skilled Therapeutic Interventions/Progress Updates:   Pt received sitting in recliner, agreeable to therapy. Pt donned shoes by crossing LEs with supervision. Gait training room <> therapy gym using RW, 2 x 150 ft with supervision. Pt educated on falls safety and appropriately verbalized when to call EMS. Pt performed floor transfer with min A and step-by-step cues for sequencing x 1 and with supervision and min cues for technique x 1. In stairwell, pt negotiated up/down 16 stairs using 2 rails with step-to pattern and supervision. Pt requires 1 standing rest break to complete stairs. In stairwell, pt with urinary urgency and episode of incontinence. When returning to room, pt requires total cues for pathfinding and unable to utilize signs to locate room. In room, pt requires total cues to identify he needed to look in drawers for clean underwear. Pt doffed/donned new brief with use of RW from recliner, supervision. Pt requires prolonged rest break due to fatigue. Gait training room <> ortho gym using RW, 2 x 100 ft with supervision. Pt performed car transfer to mid size SUV using RW with supervision and verbal cues for technique and hand placement. Pt returned to room and left sitting in recliner with all needs within reach.   Therapy Documentation Precautions:  Precautions Precautions: Fall Precaution Comments: L field deficit, visual perceptual deficits Restrictions Weight Bearing Restrictions: No Vital Signs: Therapy Vitals Pulse Rate: 65 Resp: 16 BP: (!) 142/69 mmHg Oxygen Therapy SpO2: 99 % O2 Device: Not Delivered Pain: Pain Assessment Pain Assessment: No/denies pain Pain  Score: 0-No pain   See FIM for current functional status  Therapy/Group: Individual Therapy  Laretta Alstrom 12/24/2013, 10:15 AM

## 2013-12-24 NOTE — Plan of Care (Signed)
Problem: RH SAFETY Goal: RH STG ADHERE TO SAFETY PRECAUTIONS W/ASSISTANCE/DEVICE STG Adhere to Safety Precautions With cues Assistance/Device.  Outcome: Progressing  Problem: RH PAIN MANAGEMENT Goal: RH STG PAIN MANAGED AT OR BELOW PT'S PAIN GOAL At or below level 5  Outcome: Completed/Met Date Met:  12/24/13

## 2013-12-24 NOTE — Plan of Care (Signed)
Problem: RH SAFETY Goal: RH STG ADHERE TO SAFETY PRECAUTIONS W/ASSISTANCE/DEVICE STG Adhere to Safety Precautions With cues Assistance/Device.  Outcome: Progressing Goal: RH STG DECREASED RISK OF FALL WITH ASSISTANCE STG Decreased Risk of Fall With cues/reminders Assistance.  Outcome: Progressing  Problem: RH PAIN MANAGEMENT Goal: RH STG PAIN MANAGED AT OR BELOW PT'S PAIN GOAL At or below level 5  Outcome: Progressing  Problem: RH KNOWLEDGE DEFICIT Goal: RH STG INCREASE KNOWLEDGE OF HYPERTENSION Patient will be able to explain rationale for [facility] diet and medications to help control blood pressure to avoid bleeding in the brain with cues/reminders/notes  Outcome: Progressing

## 2013-12-24 NOTE — Progress Notes (Signed)
Pt. Was placed on his home CPAP of 7cm H2O via nasal pillows. Pt. Is tolerating CPAP well at this time.

## 2013-12-24 NOTE — Progress Notes (Signed)
80 y.o. LH- male who was noted on 12/13/13 by wife that he was acting strange. He was complaining of a right temporal pain when he coughed and seemed off balance. She noted he went out to the drive way and seemed to be walking to the right and then was not talking sensibly. Wife also noted some weakness in his left hand. On 11/13 he was sent from PCP office to ED for evaluation. On arrival to ED initial CT scan showed a large right acute hemorraghic infarct. 2D echo with EF 55-60% with no regional wall abnormality. Patient with lethargy as well as apneic episodes on 11/14 and repeat CT head was with evolving right parietal IPH with mild shift and mild right lateral ventricle effacement Subjective/Complaints: Feeling stronger Not  aware of any visual problems Review of Systems - Negative except as above  Objective: Vital Signs: Blood pressure 136/65, pulse 66, temperature 98 F (36.7 C), temperature source Oral, resp. rate 16, SpO2 98 %. No results found. Results for orders placed or performed during the hospital encounter of 12/20/13 (from the past 72 hour(s))  CBC WITH DIFFERENTIAL     Status: Abnormal   Collection Time: 12/21/13 10:26 AM  Result Value Ref Range   WBC 13.4 (H) 4.0 - 10.5 K/uL    Comment: REPEATED TO VERIFY   RBC 3.70 (L) 4.22 - 5.81 MIL/uL   Hemoglobin 11.1 (L) 13.0 - 17.0 g/dL    Comment: REPEATED TO VERIFY   HCT 33.5 (L) 39.0 - 52.0 %   MCV 90.5 78.0 - 100.0 fL   MCH 30.0 26.0 - 34.0 pg   MCHC 33.1 30.0 - 36.0 g/dL   RDW 13.5 11.5 - 15.5 %   Platelets 355 150 - 400 K/uL    Comment: REPEATED TO VERIFY   Neutrophils Relative % 67 43 - 77 %   Neutro Abs 9.0 (H) 1.7 - 7.7 K/uL   Lymphocytes Relative 18 12 - 46 %   Lymphs Abs 2.5 0.7 - 4.0 K/uL   Monocytes Relative 12 3 - 12 %   Monocytes Absolute 1.7 (H) 0.1 - 1.0 K/uL   Eosinophils Relative 2 0 - 5 %   Eosinophils Absolute 0.3 0.0 - 0.7 K/uL   Basophils Relative 0 0 - 1 %   Basophils Absolute 0.0 0.0 - 0.1  K/uL  Comprehensive metabolic panel     Status: Abnormal   Collection Time: 12/21/13 10:26 AM  Result Value Ref Range   Sodium 134 (L) 137 - 147 mEq/L   Potassium 4.3 3.7 - 5.3 mEq/L    Comment: DELTA CHECK NOTED   Chloride 94 (L) 96 - 112 mEq/L   CO2 28 19 - 32 mEq/L   Glucose, Bld 129 (H) 70 - 99 mg/dL   BUN 19 6 - 23 mg/dL   Creatinine, Ser 0.96 0.50 - 1.35 mg/dL   Calcium 8.7 8.4 - 10.5 mg/dL   Total Protein 7.2 6.0 - 8.3 g/dL   Albumin 2.6 (L) 3.5 - 5.2 g/dL   AST 28 0 - 37 U/L   ALT 32 0 - 53 U/L   Alkaline Phosphatase 70 39 - 117 U/L   Total Bilirubin 0.4 0.3 - 1.2 mg/dL   GFR calc non Af Amer 76 (L) >90 mL/min   GFR calc Af Amer 88 (L) >90 mL/min    Comment: (NOTE) The eGFR has been calculated using the CKD EPI equation. This calculation has not been validated in all clinical situations. eGFR's persistently <  90 mL/min signify possible Chronic Kidney Disease.    Anion gap 12 5 - 15      Constitutional: He is oriented to person, place, and time. He appears well-developed and well-nourished.  HENT:  Head: Normocephalic and atraumatic.  Eyes: Conjunctivae are normal. Pupils are equal, round, and reactive to light.  Neck: Normal range of motion. Neck supple.  Cardiovascular: Normal rate and regular rhythm.  Respiratory: Effort normal and breath sounds normal. No respiratory distress. He has no wheezes.  GI: Soft. Bowel sounds are normal.  Musculoskeletal:  No pain in feet, no pain with UE and LE AROM Neurological: He is alert and oriented to person, place, and time.  Speech without dysarthria   Skin: Skin is warm and dry.  Psychiatric: He has a normal mood and affect. His behavior is normal. Thought content normal.  Motor strength is 5/5 bilateral deltoid, biceps, triceps, grip, hip flexors, knee extensors, ankle dorsi flexors and plantar flexors Left homonymous hemianopsia with neglect Assessment/Plan: 1. Functional deficits secondary to Right temporal  -occipital hemorrhagic infarct  which require 3+ hours per day of interdisciplinary therapy in a comprehensive inpatient rehab setting. Physiatrist is providing close team supervision and 24 hour management of active medical problems listed below. Physiatrist and rehab team continue to assess barriers to discharge/monitor patient progress toward functional and medical goals. FIM: FIM - Bathing Bathing Steps Patient Completed: Chest, Right Arm, Left Arm, Abdomen, Front perineal area, Buttocks, Right upper leg, Left upper leg, Left lower leg (including foot), Right lower leg (including foot) Bathing: 5: Supervision: Safety issues/verbal cues  FIM - Upper Body Dressing/Undressing Upper body dressing/undressing steps patient completed: Thread/unthread right sleeve of pullover shirt/dresss, Thread/unthread left sleeve of pullover shirt/dress, Put head through opening of pull over shirt/dress, Pull shirt over trunk Upper body dressing/undressing: 5: Supervision: Safety issues/verbal cues FIM - Lower Body Dressing/Undressing Lower body dressing/undressing steps patient completed: Thread/unthread right pants leg, Thread/unthread left pants leg, Don/Doff right shoe, Don/Doff left shoe, Fasten/unfasten right shoe, Fasten/unfasten left shoe, Don/Doff right sock, Don/Doff left sock, Pull pants up/down, Thread/unthread right underwear leg, Thread/unthread left underwear leg, Pull underwear up/down Lower body dressing/undressing: 5: Supervision: Safety issues/verbal cues  FIM - Toileting Toileting steps completed by patient: Adjust clothing prior to toileting, Performs perineal hygiene, Adjust clothing after toileting Toileting Assistive Devices: Grab bar or rail for support Toileting: 4: Steadying assist  FIM - Radio producer Devices: Elevated toilet seat, Grab bars Toilet Transfers: 4-To toilet/BSC: Min A (steadying Pt. > 75%), 4-From toilet/BSC: Min A (steadying Pt. >  75%)  FIM - Bed/Chair Transfer Bed/Chair Transfer Assistive Devices: Copy: 4: Bed > Chair or W/C: Min A (steadying Pt. > 75%), 4: Chair or W/C > Bed: Min A (steadying Pt. > 75%)  FIM - Locomotion: Wheelchair Distance: 150 Locomotion: Wheelchair: 0: Activity did not occur FIM - Locomotion: Ambulation Locomotion: Ambulation Assistive Devices: Administrator Ambulation/Gait Assistance: 4: Min guard Locomotion: Ambulation: 4: Travels 150 ft or more with minimal assistance (Pt.>75%)  Comprehension Comprehension Mode: Auditory Comprehension: 7-Follows complex conversation/direction: With no assist  Expression Expression Mode: Verbal Expression: 5-Expresses complex 90% of the time/cues < 10% of the time  Social Interaction Social Interaction: 5-Interacts appropriately 90% of the time - Needs monitoring or encouragement for participation or interaction.  Problem Solving Problem Solving: 4-Solves basic 75 - 89% of the time/requires cueing 10 - 24% of the time  Memory Memory: 4-Recognizes or recalls 75 - 89% of the time/requires  cueing 10 - 24% of the time   Medical Problem List and Plan: 1. Functional deficits secondary to Right temporal -occipital hemorrhagic infarct 2. DVT Prophylaxis/Anticoagulation: Mechanical: Antiembolism stockings, knee (TED hose) Bilateral lower extremities Sequential compression devices, below knee Bilateral lower extremities 3. Pain Management: tylenol prn for headaches as well as foot pain.  4. Mood: Team to provide ego support and reinforcement. LCSW to follow for evaluation and support.  5. Neuropsych: This patient is is capable of making decisions on his own behalf. 6. Skin/Wound Care: Routine  7. Fluids/Electrolytes/Nutrition: Monitor I/O. Family providing food from home to help with intake. Offer supplements as needed.  8. HTN: Will monitor every 8 hours. Avoid hypotension to allow adequate perfusion BP goal< 180. Continue  HCTZ, Metoprolol and Cozaar.  9. Enterococcus UTI: Antibiotic completed levaquin, 10. Hypokalemia: Due to diuretics as well as IVF. Continue supplement with caution as on Cozaar. Will recheck lytes in am.  11. Gout flare: Continue colchicine bid.  12. Reactive leucocytosis: Likely due to UTI. Resolving.  repeat CBC to see if WBC normalized 13. ABLA: Has had a drop in H/H from 13.5-->10.5. Will monitor for H/H with serial check as well as for any occult signs of bleeding. Check stool guaiacs.  14.  Sleep apnea, non compliant with CPAP, has unit at home which he doesn't use, discussed that noncompliance can increase stroke risk LOS (Days) 4 A FACE TO FACE EVALUATION WAS PERFORMED  Brian Wolf E 12/24/2013, 7:15 AM

## 2013-12-24 NOTE — Plan of Care (Signed)
Problem: RH SAFETY Goal: RH STG ADHERE TO SAFETY PRECAUTIONS W/ASSISTANCE/DEVICE STG Adhere to Safety Precautions With cues Assistance/Device.  Outcome: Progressing

## 2013-12-24 NOTE — Progress Notes (Signed)
Social Work Patient ID: Brian Wolf, male   DOB: April 28, 1931, 80 y.o.   MRN: [id number] Team feels pt will be ready for discharge Wed, need family education.  Spoke with wife and two granddaughters to schedule tomorrow at 68;00 Pt is ready and wife will be here tomorrow.

## 2013-12-24 NOTE — Plan of Care (Signed)
Problem: RH SAFETY Goal: RH STG DECREASED RISK OF FALL WITH ASSISTANCE STG Decreased Risk of Fall With cues/reminders Assistance.  Outcome: Progressing

## 2013-12-24 NOTE — Progress Notes (Signed)
Speech Language Pathology Daily Session Note  Patient Details  Name: Brian Wolf MRN: [id number] Date of Birth: 1931-06-21  Today's Date: 12/24/2013 SLP Individual Time: [id number] SLP Individual Time Calculation (min): 53 min  Short Term Goals: Week 1: SLP Short Term Goal 1 (Week 1): STG=LTG due to LOS  Skilled Therapeutic Interventions: Skilled treatment session focused on addressing cognition goals. SLP facilitated session by providing Mod assist question cues for recall of task that was initiated last session; patient attempted to say that his wife did the remembering for him but after SLP encouragement to try without wife since she was not present he was more willing to attempt therapy tasks.  SLP also facilitated session with Mod faded to [place] instructional cues to accurately load medication box.  Continue with current plan of care.    FIM:  Comprehension Comprehension Mode: Auditory Comprehension: 5-Follows basic conversation/direction: With no assist Expression Expression Mode: Verbal Expression: 5-Expresses complex 90% of the time/cues < 10% of the time Social Interaction Social Interaction: 5-Interacts appropriately 90% of the time - Needs monitoring or encouragement for participation or interaction. Problem Solving Problem Solving: 4-Solves basic 75 - 89% of the time/requires cueing 10 - 24% of the time Memory Memory: 4-Recognizes or recalls 75 - 89% of the time/requires cueing 10 - 24% of the time  Pain Pain Assessment Pain Assessment: No/denies pain  Therapy/Group: Individual Therapy  Carmelia Roller., CCC-SLP [id number]  [place] 12/24/2013, 4:17 PM

## 2013-12-25 ENCOUNTER — Inpatient Hospital Stay (HOSPITAL_COMMUNITY): Payer: Medicare Other | Admitting: Speech Pathology

## 2013-12-25 ENCOUNTER — Inpatient Hospital Stay (HOSPITAL_COMMUNITY): Payer: Medicare Other

## 2013-12-25 ENCOUNTER — Inpatient Hospital Stay (HOSPITAL_COMMUNITY): Payer: Medicare Other | Admitting: Physical Therapy

## 2013-12-25 DIAGNOSIS — I69398 Other sequelae of cerebral infarction: Secondary | ICD-10-CM

## 2013-12-25 DIAGNOSIS — H53469 Homonymous bilateral field defects, unspecified side: Secondary | ICD-10-CM

## 2013-12-25 LAB — CBC
HCT: 37.6 % — ABNORMAL LOW (ref 39.0–52.0)
HEMOGLOBIN: 12.4 g/dL — AB (ref 13.0–17.0)
MCH: 30.6 pg (ref 26.0–34.0)
MCHC: 33 g/dL (ref 30.0–36.0)
MCV: 92.8 fL (ref 78.0–100.0)
Platelets: 438 10*3/uL — ABNORMAL HIGH (ref 150–400)
RBC: 4.05 MIL/uL — AB (ref 4.22–5.81)
RDW: 14.1 % (ref 11.5–15.5)
WBC: 8.8 10*3/uL (ref 4.0–10.5)

## 2013-12-25 MED ORDER — SENNOSIDES-DOCUSATE SODIUM 8.6-50 MG PO TABS
1.0000 | ORAL_TABLET | Freq: Two times a day (BID) | ORAL | Status: DC
  Administered 2013-12-25 – 2013-12-26 (×3): 1 via ORAL
  Filled 2013-12-25 (×2): qty 1

## 2013-12-25 MED ORDER — SENNOSIDES-DOCUSATE SODIUM 8.6-50 MG PO TABS: 2.0000 | ORAL_TABLET | Freq: Two times a day (BID) | ORAL | Status: DC

## 2013-12-25 NOTE — Progress Notes (Signed)
Physical Therapy Discharge Summary  Patient Details  Name: Brian Wolf MRN: [id number] Date of Birth: November 11, 1931  Today's Date: 12/25/2013 PT Individual Time: 1000-1100 PT Individual Time Calculation (min): 60 min    Patient has met 9 of 9 long term goals due to improved activity tolerance, improved balance, improved postural control, increased strength, ability to compensate for deficits, improved attention, improved awareness and improved coordination.  Patient to discharge at an ambulatory level [place] [employer] [place].   Patient's care partner is independent to provide the necessary physical assistance at discharge.  Reasons goals not met: NA  Recommendation:  Patient will benefit from ongoing skilled PT services in home health setting to continue to advance safe functional mobility, address ongoing impairments in endurance, strength, standing balance, attending/scanning to L and minimize fall risk.  Equipment: No equipment provided  Reasons for discharge: treatment goals met and discharge from hospital  Patient/family agrees with progress made and goals achieved: Yes  Skilled Therapeutic Intervention Patient received sitting in recliner, wife present for family training. Pt performed all mobility with wife providing supervision or with mod I using RW. Pt performed car transfer to mid SUV height using RW with supervision, verbal cues for safe hand placement. In stairwell, pt negotiated up/down 16 steps using 2 rails, self electing reciprocal or step-to pattern. Pt performed floor transfer with supervision and able to verbalize falls safety (when to call EMS). Gait using RW x 150 ft with mod I in controlled and home environments and without AD x 150 ft with supervision. Pt with increased lateral sway without AD which wife reports is patient's baseline PTA. Berg Balance Scale administered with score of 45/56, improved from 31/56 on evaluation. Pt and wife educated on results and risk for  falls. Both verbalized understanding. Wife with questions regarding outdoor ambulation on grass/uneven surfaces. Due to weather, pt and wife declined ambulation outdoors this session. Simulated uneven surface with ambulation without AD on foam mat with supervision, pt with no LOB but increased staggering. Discussed f/u therapy and answered questions regarding discharge. Pt's wife asking about use of rollator this session. Pt and wife educated on continued need for safe hand placement for transfers with RW and increased challenge with locking/unlocking brakes appropriately and need to practice/trial rollator. Deferred to [facility] therapist at this time. Pt left sitting in recliner with all needs within reach and wife present.   PT Discharge Precautions/Restrictions Precautions Precautions: Fall Precaution Comments: L field deficit, visual perceptual deficits Restrictions Weight Bearing Restrictions: No Pain Pain Assessment Pain Assessment: No/denies pain Pain Score: 0-No pain Vision/Perception   Defer to OT discharge summary  Cognition Orientation Level: Oriented X4 Sensation Sensation Light Touch: Appears Intact Stereognosis: Appears Intact Hot/Cold: Appears Intact Proprioception: Appears Intact Coordination Gross Motor Movements are Fluid and Coordinated: Yes Fine Motor Movements are Fluid and Coordinated: Yes Motor  Motor Motor: Abnormal postural alignment and control Motor - Discharge Observations: thoracic kyphosis and posterior pelvic tilt  Mobility Bed Mobility Bed Mobility: Sit to Supine;Supine to Sit Supine to Sit: 6: Modified independent (Device/Increase time) Sit to Supine: 6: Modified independent (Device/Increase time) Sit to Supine - Details (indicate cue type and reason): pt reports hx of "motion sickness" in supine, no nystagmus noted Transfers Transfers: Yes Stand Pivot Transfers: 6: Modified independent (Device/Increase time) Locomotion  Ambulation Ambulation:  Yes Ambulation/Gait Assistance: 6: Modified independent (Device/Increase time) Ambulation Distance (Feet): 150 Feet Assistive device: Rolling walker Gait Gait: Yes Gait Pattern: Impaired Gait Pattern: Step-through pattern;Lateral trunk lean to left;Lateral trunk  lean to right;Decreased trunk rotation Gait velocity: decreased Stairs / Additional Locomotion Stairs: Yes Stairs Assistance: 5: Supervision Stair Management Technique: Two rails;Alternating pattern;Forwards;Step to pattern Number of Stairs: 16 Height of Stairs: 6 Wheelchair Mobility Wheelchair Mobility: No (pt ambulatory)  Trunk/Postural Assessment  Cervical Assessment Cervical Assessment: Within Functional Limits Thoracic Assessment Thoracic Assessment: Exceptions to [facility] (rounded shoulders) Lumbar Assessment Lumbar Assessment: Exceptions to WFL (kyphotic) Postural Control Postural Control: Deficits on evaluation Protective Responses: delayed  Balance Balance Balance Assessed: Yes Standardized Balance Assessment Standardized Balance Assessment: Berg Balance Test Berg Balance Test Sit to Stand: Able to stand without using hands and stabilize independently Standing Unsupported: Able to stand safely 2 minutes Sitting with Back Unsupported but Feet Supported on Floor or Stool: Able to sit safely and securely 2 minutes Stand to Sit: Sits safely with minimal use of hands Transfers: Able to transfer safely, minor use of hands Standing Unsupported with Eyes Closed: Able to stand 10 seconds safely Standing Ubsupported with Feet Together: Able to place feet together independently and stand 1 minute safely From Standing, Reach Forward with Outstretched Arm: Can reach forward >12 cm safely (5") From Standing Position, Pick up Object from Floor: Able to pick up shoe safely and easily From Standing Position, Turn to Look Behind Over each Shoulder: Turn sideways only but maintains balance Turn 360 Degrees: Able to turn 360  degrees safely but slowly Standing Unsupported, Alternately Place Feet on Step/Stool: Able to stand independently and complete 8 steps >20 seconds Standing Unsupported, One Foot in Front: Needs help to step but can hold 15 seconds Standing on One Leg: Able to lift leg independently and hold equal to or more than 3 seconds Total Score: 45/56 Extremity Assessment  RUE Assessment RUE Assessment: Within Functional Limits LUE Assessment LUE Assessment: Within Functional Limits RLE Assessment RLE Assessment: Within Functional Limits LLE Assessment LLE Assessment: Within Functional Limits (grossly 4+/5 throughout except ankle DF 4-/5)  See FIM for current functional status  Laretta Alstrom 12/25/2013, 10:39 AM

## 2013-12-25 NOTE — Progress Notes (Signed)
Social Work Discharge Note Discharge Note  The overall goal for the admission was met for:   Discharge location: Yes-HOME WITH WIFE WHO CAN PROVIDE 24 HR SUPERVISION LEVEL  Length of Stay: Yes-7 DAYS  Discharge activity level: Yes-SUPERVISION/ MOD/I LEVEL  Home/community participation: Yes  Services provided included: MD, RD, PT, OT, RN, CM, Pharmacy and SW  Financial Services: Private Insurance: [employer]  Follow-up services arranged: Home Health: [place] CARE-PT,OT,RN, SP, DME: [place] and Patient/Family has no preference for HH/DME agencies  Comments (or additional information):WIFE [place] FEELS READY FOR HIS DISCHARGE. AWARE TO FOLLOW UP WITH HIS HOME CPAP MACHINE HAVING IT RE-SET.  Patient/Family verbalized understanding of follow-up arrangements: Yes  Individual responsible for coordination of the follow-up plan: SELF & FRIEDA-WIFE  Confirmed correct DME delivered: Brian Wolf 12/25/2013    Brian Wolf

## 2013-12-25 NOTE — Progress Notes (Signed)
ventricle effacement Subjective/Complaints: Doing well , no new issues Review of Systems - Negative except as above  Objective: Vital Signs: Blood pressure 132/70, pulse 64, temperature 98.3 F (36.8 C), temperature source Oral, resp. rate 17, SpO2 100 %. No results found. No results found for this or any previous visit (from the past 72 hour(s)).    Constitutional: He is oriented to person, place, and time. He appears well-developed and well-nourished.  HENT:  Head: Normocephalic and atraumatic.  Eyes: Conjunctivae are normal. Pupils are equal, round, and reactive to light.  Neck: Normal range of motion. Neck supple.  Cardiovascular: Normal rate and regular rhythm.  Respiratory: Effort normal and breath sounds normal. No respiratory distress. He has no wheezes.  GI: Soft. Bowel sounds are normal.  Musculoskeletal:  No pain in feet, no pain with UE and LE AROM Neurological: He is alert and oriented to person, place, and time.  Speech without dysarthria   Skin: Skin is warm and dry.  Psychiatric: He has a normal mood and affect. His behavior is normal. Thought content normal.  Motor strength is 5/5 bilateral deltoid, biceps, triceps, grip, hip flexors, knee extensors, ankle dorsi flexors and plantar flexors Left  neglect Assessment/Plan: 1. Functional deficits secondary to Right temporal -occipital hemorrhagic infarct  which require 3+ hours per day of interdisciplinary therapy in a comprehensive inpatient rehab setting. Physiatrist is providing close team supervision and 24 hour management of active medical problems listed below. Physiatrist and rehab team continue to assess barriers to discharge/monitor patient progress toward functional and medical goals. Ready for D/C in am FIM: FIM - Bathing Bathing Steps Patient Completed: Chest, Right Arm, Left Arm, Abdomen, Front perineal area, Buttocks, Right upper leg, Left upper leg, Left lower leg (including foot), Right  lower leg (including foot) Bathing: 5: Supervision: Safety issues/verbal cues  FIM - Upper Body Dressing/Undressing Upper body dressing/undressing steps patient completed: Thread/unthread right sleeve of pullover shirt/dresss, Thread/unthread left sleeve of pullover shirt/dress, Put head through opening of pull over shirt/dress, Pull shirt over trunk Upper body dressing/undressing: 5: Supervision: Safety issues/verbal cues FIM - Lower Body Dressing/Undressing Lower body dressing/undressing steps patient completed: Thread/unthread right pants leg, Thread/unthread left pants leg, Don/Doff right shoe, Don/Doff left shoe, Fasten/unfasten right shoe, Fasten/unfasten left shoe, Don/Doff right sock, Don/Doff left sock, Pull pants up/down, Thread/unthread right underwear leg, Thread/unthread left underwear leg, Pull underwear up/down Lower body dressing/undressing: 5: Supervision: Safety issues/verbal cues  FIM - Toileting Toileting steps completed by patient: Adjust clothing prior to toileting, Performs perineal hygiene, Adjust clothing after toileting Toileting Assistive Devices: Grab bar or rail for support Toileting: 5: Supervision: Safety issues/verbal cues  FIM - Radio producer Devices: Elevated toilet seat, Grab bars Toilet Transfers: 5-To toilet/BSC: Supervision (verbal cues/safety issues), 5-From toilet/BSC: Supervision (verbal cues/safety issues)  FIM - Control and instrumentation engineer Devices: Copy: 5: Bed > Chair or W/C: Supervision (verbal cues/safety issues), 5: Chair or W/C > Bed: Supervision (verbal cues/safety issues)  FIM - Locomotion: Wheelchair Distance: 150 Locomotion: Wheelchair: 0: Activity did not occur FIM - Locomotion: Ambulation Locomotion: Ambulation Assistive Devices: Administrator Ambulation/Gait Assistance: 5: Supervision Locomotion: Ambulation: 5: Travels 150 ft or more with supervision/safety  issues  Comprehension Comprehension Mode: Auditory Comprehension: 5-Follows basic conversation/direction: With no assist  Expression Expression Mode: Verbal Expression: 5-Expresses complex 90% of the time/cues < 10% of the time  Social Interaction Social Interaction: 5-Interacts appropriately 90% of the time - Needs monitoring or encouragement for  participation or interaction.  Problem Solving Problem Solving: 4-Solves basic 75 - 89% of the time/requires cueing 10 - 24% of the time  Memory Memory: 4-Recognizes or recalls 75 - 89% of the time/requires cueing 10 - 24% of the time   Medical Problem List and Plan: 1. Functional deficits secondary to Right temporal -occipital hemorrhagic infarct 2. DVT Prophylaxis/Anticoagulation: Mechanical: Antiembolism stockings, knee (TED hose) Bilateral lower extremities Sequential compression devices, below knee Bilateral lower extremities 3. Pain Management: tylenol prn for headaches as well as foot pain.  4. Mood: Team to provide ego support and reinforcement. LCSW to follow for evaluation and support.  5. Neuropsych: This patient is is capable of making decisions on his own behalf. 6. Skin/Wound Care: Routine  7. Fluids/Electrolytes/Nutrition: Monitor I/O. Family providing food from home to help with intake. Offer supplements as needed.  8. HTN: Will monitor every 8 hours. Avoid hypotension to allow adequate perfusion BP goal< 180. Continue HCTZ, Metoprolol and Cozaar.  9. Enterococcus UTI: Antibiotic completed levaquin, 10. Hypokalemia: Due to diuretics as well as IVF. Continue supplement with caution as on Cozaar. Will recheck lytes in am.  11. Gout flare: Continue colchicine bid.  12. Reactive leucocytosis: Likely due to UTI. Resolving.  repeat CBC to see if WBC normalized 13. ABLA: Has had a drop in H/H from 13.5-->10.5. Will monitor for H/H with serial check as well as for any occult signs of bleeding. Check stool guaiacs.  14.   Sleep apnea, non compliant with CPAP, has unit at home which he doesn't use, discussed that noncompliance can increase stroke risk LOS (Days) 5 A FACE TO FACE EVALUATION WAS PERFORMED  Brian Wolf E 12/25/2013, 7:30 AM

## 2013-12-25 NOTE — Plan of Care (Signed)
Problem: RH SAFETY Goal: RH STG ADHERE TO SAFETY PRECAUTIONS W/ASSISTANCE/DEVICE STG Adhere to Safety Precautions With supervision Outcome: Progressing Goal: RH STG DECREASED RISK OF FALL WITH ASSISTANCE STG Decreased Risk of Fall With supervision Outcome: Progressing

## 2013-12-25 NOTE — Progress Notes (Signed)
Occupational Therapy Discharge Summary  Patient Details  Name: Brian Wolf MRN: [id number] Date of Birth: January 16, 1932  Today's Date: 12/25/2013 OT Individual Time: 1100-1200 OT Individual Time Calculation (min): 60 min    Patient has met 8 of 10 long term goals due to improved activity tolerance, improved balance, ability to compensate for deficits and improved awareness.  Patient to discharge at overall Modified Independent level.  Patient's care partner is independent to provide the necessary physical assistance at discharge.    Reasons goals not met: Pt demo's mild STM deficit as evidenced by inability to recall 3 of 3 test items declared within 5 min.   Pt did not perform meal prep task as planned during admission d/t prioritization of treatments changed.  Recommendation:  Patient will benefit from ongoing skilled OT services in home health setting to continue to advance functional skills in the area of iADL.  Equipment: Tub bench, BSC  Reasons for discharge: discharge from hospital  Patient/family agrees with progress made and goals achieved: Yes  OT Discharge Precautions/Restrictions  Precautions Precaution Comments: L field deficit, visual perceptual deficits Restrictions Weight Bearing Restrictions: No  Pain Pain Assessment Pain Assessment: No/denies pain  ADL ADL ADL Comments: see FIM  Vision/Perception  Vision- History Patient Visual Report: Other (comment) (reports he is due for f/u with opthamologist s/p d/c for cataracts) Vision- Assessment Vision Assessment?: Yes Ocular Range of Motion: Restricted on the left;Impaired-to be further tested in functional context;Restricted looking up Alignment/Gaze Preference: Within Defined Limits Tracking/Visual Pursuits: Decreased smoothness of eye movement to LEFT superior field Saccades: Within functional limits Convergence: Within functional limits Visual Fields: Impaired-to be further tested in functional  context   Cognition Overall Cognitive Status: Within Functional Limits for tasks assessed Arousal/Alertness: Awake/alert Orientation Level: Oriented X4 Attention: Alternating Alternating Attention: Appears intact Memory: Impaired Memory Impairment: Decreased short term memory Awareness: Impaired Awareness Impairment: Anticipatory impairment Problem Solving: Impaired Problem Solving Impairment: Functional complex Reasoning: Appears intact Sequencing: Appears intact Organizing: Appears intact Self Monitoring: Appears intact Self Correcting: Appears intact Safety/Judgment: Appears intact  Sensation Sensation Light Touch: Appears Intact Stereognosis: Appears Intact Hot/Cold: Appears Intact Proprioception: Appears Intact Additional Comments: @ BUE Coordination Gross Motor Movements are Fluid and Coordinated: Yes Fine Motor Movements are Fluid and Coordinated: Yes  Motor  Motor Motor: Abnormal postural alignment and control Motor - Discharge Observations: thoracic kyphosis and posterior pelvic tilt  Mobility  Bed Mobility Bed Mobility: Sit to Supine;Supine to Sit Rolling Right: 6: Modified independent (Device/Increase time) Supine to Sit: 6: Modified independent (Device/Increase time) Sit to Supine: 6: Modified independent (Device/Increase time) Transfers Transfers: Sit to Stand;Stand to Sit Sit to Stand: 6: Modified independent (Device/Increase time) Stand to Sit: 6: Modified independent (Device/Increase time)   Trunk/Postural Assessment  Cervical Assessment Cervical Assessment: Within Functional Limits Thoracic Assessment Thoracic Assessment: Exceptions to [facility] Lumbar Assessment Lumbar Assessment: Exceptions to [facility] Postural Control Postural Control: Deficits on evaluation Protective Responses: delayed   Balance Balance Balance Assessed: Yes Standardized Balance Assessment Standardized Balance Assessment: Berg Balance Test Berg Balance Test Sit to Stand:  Able to stand without using hands and stabilize independently Standing Unsupported: Able to stand safely 2 minutes Sitting with Back Unsupported but Feet Supported on Floor or Stool: Able to sit safely and securely 2 minutes Stand to Sit: Sits safely with minimal use of hands Transfers: Able to transfer safely, minor use of hands Standing Unsupported with Eyes Closed: Able to stand 10 seconds safely Standing Ubsupported with Feet Together: Able to  place feet together independently and stand 1 minute safely From Standing, Reach Forward with Outstretched Arm: Can reach forward >12 cm safely (5") From Standing Position, Pick up Object from Floor: Able to pick up shoe safely and easily From Standing Position, Turn to Look Behind Over each Shoulder: Turn sideways only but maintains balance Turn 360 Degrees: Able to turn 360 degrees safely but slowly Standing Unsupported, Alternately Place Feet on Step/Stool: Able to stand independently and complete 8 steps >20 seconds Standing Unsupported, One Foot in Front: Needs help to step but can hold 15 seconds Standing on One Leg: Able to lift leg independently and hold equal to or more than 3 seconds Total Score: 45 Dynamic Sitting Balance Dynamic Sitting - Balance Support: Feet supported;During functional activity Dynamic Sitting - Level of Assistance: 6: Modified independent (Device/Increase time) Static Standing Balance Static Standing - Balance Support: No upper extremity supported Static Standing - Level of Assistance: 6: Modified independent (Device/Increase time) Dynamic Standing Balance Dynamic Standing - Balance Support: During functional activity Dynamic Standing - Level of Assistance: 6: Modified independent (Device/Increase time)  Extremity/Trunk Assessment RUE Assessment RUE Assessment: Within Functional Limits LUE Assessment LUE Assessment: Within Functional Limits  See FIM for current functional status  Mahnoor Mathisen 12/25/2013,  3:33 PM

## 2013-12-25 NOTE — Progress Notes (Signed)
Speech Language Pathology Discharge Summary  Patient Details  Name: Brian Wolf MRN: [id number] Date of Birth: Jul 12, 1931  Today's Date: 12/25/2013 SLP Individual Time: 1301-1401 SLP Individual Time Calculation (min): 60 min   Skilled Therapeutic Interventions:  Pt was seen for skilled ST targeting family education prior to discharge.  Upon arrival, pt was seated upright in recliner, awake, alert, and agreeable to participate in [place].  Pt's wife was present for the duration of today's therapy session and remained engaged in all therapeutic activities.  SLP provided skilled education related to compensatory strategies for cognition emphasizing organization, routine, and use of written aids to maximize functional independence in the home environment. Handouts were also provided of cognitive reorganization activities to facilitate carryover of skills in between therapy sessions.  SLP recommended 24/7 supervision for safety and assistance for medication and financial management at discharge.  SLP also reviewed and reinforced recommendation for home health or outpatient ST follow up to maximize functional independence and reduce burden of care upon discharge.  Pt and pt's wife were both agreeable to the abovementioned recommendations as wife reports that pt is not yet returned to his cognitive baseline.  Pt is on track for discharge tomorrow.     Patient has met 4 of 4 long term goals.  Patient to discharge at overall Supervision level.  Reasons goals not met: n/a   Clinical Impression/Discharge Summary:  Pt made functional gains while inpatient and is discharging having met 4 out of 4 long term goals due to improved emergent awareness, use of compensatory aids for recall of daily information, and semi-complex problem solving.  Pt currently requires overall supervision level assist for cognitive tasks due to decreased error awareness, mental flexibility, and organization for functional problem  solving.  Pt is discharging home with 24/7 supervision from family and assistance for medication and financial management.  Pt would benefit from skilled ST follow up at discharge in order to maximize functional independence and reduce burden of care.  Pt and family education is complete at this time.     Care Partner:  Caregiver Able to Provide Assistance: Yes  Type of Caregiver Assistance: Physical;Cognitive  Recommendation:  24 hour supervision/assistance;Home Health SLP  Rationale for SLP Follow Up: Maximize cognitive function and independence;Reduce caregiver burden   Equipment: none recommended by SLP    Reasons for discharge: Discharged from hospital   Patient/Family Agrees with Progress Made and Goals Achieved: Yes   See FIM for current functional status  Windell Moulding, M.A. CCC-SLP  Cambell Stanek, Selinda Orion 12/25/2013, 4:10 PM

## 2013-12-25 NOTE — Plan of Care (Signed)
Problem: RH Balance Goal: LTG Patient will maintain dynamic standing with ADLs (OT) LTG: Patient will maintain dynamic standing balance with assist during activities of daily living (OT)  Outcome: Completed/Met Date Met:  12/25/13  Problem: RH Grooming Goal: LTG Patient will perform grooming w/assist,cues/equip (OT) LTG: Patient will perform grooming with assist, with/without cues using equipment (OT)  Outcome: Completed/Met Date Met:  12/25/13  Problem: RH Bathing Goal: LTG Patient will bathe with assist, cues/equipment (OT) LTG: Patient will bathe specified number of body parts with assist with/without cues using equipment (position) (OT)  Outcome: Completed/Met Date Met:  12/25/13  Problem: RH Dressing Goal: LTG Patient will perform upper body dressing (OT) LTG Patient will perform upper body dressing with assist, with/without cues (OT).  Outcome: Completed/Met Date Met:  12/25/13 Goal: LTG Patient will perform lower body dressing w/assist (OT) LTG: Patient will perform lower body dressing with assist, with/without cues in positioning using equipment (OT)  Outcome: Completed/Met Date Met:  12/25/13  Problem: RH Toileting Goal: LTG Patient will perform toileting w/assist, cues/equip (OT) LTG: Patient will perform toiletiing (clothes management/hygiene) with assist, with/without cues using equipment (OT)  Outcome: Completed/Met Date Met:  12/25/13  Problem: RH Vision Goal: RH LTG Vision (Specify) Outcome: Completed/Met Date Met:  12/25/13  Problem: RH Simple Meal Prep Goal: LTG Patient will perform simple meal prep w/assist (OT) LTG: Patient will perform simple meal prep with assistance, with/without cues (OT).  Outcome: Adequate for Discharge  Problem: RH Toilet Transfers Goal: LTG Patient will perform toilet transfers w/assist (OT) LTG: Patient will perform toilet transfers with assist, with/without cues using equipment (OT)  Outcome: Completed/Met Date Met:   12/25/13  Problem: RH Tub/Shower Transfers Goal: LTG Patient will perform tub/shower transfers w/assist (OT) LTG: Patient will perform tub/shower transfers with assist, with/without cues using equipment (OT)  Outcome: Completed/Met Date Met:  12/25/13

## 2013-12-25 NOTE — Progress Notes (Signed)
Recreational Therapy Session Note  Patient Details  Name: Ether Wolters MRN: [id number] Date of Birth: 1931-08-02 Today's Date: 12/25/2013 LATE ENTRY from 12/24/2013  Met with pt and discussed TR services.  Pt with very short LOS with discharge scheduled for tomorrow.  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  No formal TR treatment plan implemented.  Dyan Creelman 12/25/2013, 9:54 AM

## 2013-12-25 NOTE — Plan of Care (Signed)
Problem: RH SAFETY Goal: RH STG ADHERE TO SAFETY PRECAUTIONS W/ASSISTANCE/DEVICE STG Adhere to Safety Precautions With supervision  Outcome: Progressing     

## 2013-12-25 NOTE — Plan of Care (Signed)
Problem: RH Problem Solving Goal: LTG Patient will demonstrate problem solving for (SLP) LTG: Patient will demonstrate problem solving for basic/complex daily situations with cues (SLP)  Outcome: Completed/Met Date Met:  12/25/13  Problem: RH Memory Goal: LTG Patient will demonstrate ability for day to day (SLP) LTG: Patient will demonstrate ability for day to day recall/carryover during cognitive/linguistic activities with assist (SLP)  Outcome: Completed/Met Date Met:  12/25/13 Goal: LTG Patient will use memory compensatory aids to (SLP) LTG: Patient will use memory compensatory aids to recall biographical/new, daily complex information with cues (SLP)  Outcome: Completed/Met Date Met:  12/25/13  Problem: RH Awareness Goal: LTG: Patient will demonstrate intellectual/emergent (SLP) LTG: Patient will demonstrate intellectual/emergent/anticipatory awareness with assist during a cognitive/linguistic activity (SLP)  Outcome: Completed/Met Date Met:  12/25/13

## 2013-12-25 NOTE — Progress Notes (Signed)
Social Work Patient ID: Brian Wolf, male   DOB: 22-Aug-1931, 80 y.o.   MRN: [id number] Met with pt and wife to discuss how education went and any questions.  Both would like a rollator walker instead of a regular rolling walker.  Spoke with Quinnlyn Hearns-PT and she does not recommend One due to safety reasons.  Explained to both about this and they are in agreement.  Discussed getting a transport chair for community distances.  Also addressed pt's CPAP at home, they will take it back To where they got it to have them look at it and reset it.  Otherwise ready for discharge tomorrow.

## 2013-12-25 NOTE — Plan of Care (Signed)
Problem: RH Simple Meal Prep Goal: LTG Patient will perform simple meal prep w/assist (OT) LTG: Patient will perform simple meal prep with assistance, with/without cues (OT).  Outcome: Adequate for Discharge  Problem: RH Memory Goal: LTG Patient will demonstrate ability for day to day (OT) LTG: Patient will demonstrate ability for day to day recall/carryover during activities of daily living with assist (OT)  Outcome: Progressing  Comments:  Pt able to recall 2 of 3 items after 5 minutes.    Pt reports confidence in meal prep skills although unobserved during admission.

## 2013-12-26 MED ORDER — PANTOPRAZOLE SODIUM 40 MG PO TBEC: 40.0000 mg | DELAYED_RELEASE_TABLET | Freq: Every day | ORAL | Status: DC

## 2013-12-26 MED ORDER — HYDROCHLOROTHIAZIDE 25 MG PO TABS: 37.5000 mg | ORAL_TABLET | Freq: Every day | ORAL | Status: DC

## 2013-12-26 MED ORDER — POTASSIUM CHLORIDE CRYS ER 20 MEQ PO TBCR: 20.0000 meq | EXTENDED_RELEASE_TABLET | Freq: Every day | ORAL | Status: DC

## 2013-12-26 MED ORDER — SENNOSIDES-DOCUSATE SODIUM 8.6-50 MG PO TABS: 1.0000 | ORAL_TABLET | Freq: Two times a day (BID) | ORAL | Status: DC

## 2013-12-26 NOTE — Discharge Summary (Signed)
Physician Discharge Summary  Patient ID: Brian Wolf MRN: [id number] DOB/AGE: August 07, 1931 80 y.o.  Admit date: 12/20/2013 Discharge date: 12/26/2013  Discharge Diagnoses:  Principal Problem:   ICH (intracerebral hemorrhage) Active Problems:   Gout flare   Benign essential HTN   Other secondary acute gout of left foot   Acute hemorrhagic infarction of brain   Homonymous hemianopsia due to recent cerebral infarction   Left-sided neglect   Discharged Condition:  Stable   Labs:  Basic Metabolic Panel:  Recent Labs Lab 12/20/13 0637 12/21/13 1026  NA 136* 134*  K 3.5* 4.3  CL 94* 94*  CO2 28 28  GLUCOSE 105* 129*  BUN 18 19  CREATININE 0.88 0.96  CALCIUM 8.1* 8.7    CBC:  Recent Labs Lab 12/20/13 0637 12/21/13 1026 12/25/13 0838  WBC 13.9* 13.4* 8.8  NEUTROABS  --  9.0*  --   HGB 10.9* 11.1* 12.4*  HCT 32.8* 33.5* 37.6*  MCV 89.4 90.5 92.8  PLT 269 355 438*    CBG: No results for input(s): GLUCAP in the last 168 hours.  Brief HPI:   Brian Wolf is a 80 y.o. LH- male who was admitted from PCP office on 11/13 with complaints of difficulty walking, difficulty talking as well as "not acting right".  On arrival to ED initial CT scan showed a large right acute hemorraghic infarct.  Patient with lethargy as well as apneic episodes on 11/14 and repeat CT head was with evolving right parietal IPH with mild shift and mild right lateral ventricle effacement and was started on hypertonic saline for edema.  MRI/MRA brain with Large RIGHT temporal occipital lobar hemorrhage with surrounding edema and subcentimeter of acute infarct right lateral temporal lobe. Mentation has improved and patient taken off bedrest 11/16 with initiation of therapy. He was started on cipro for enterobacter UTI as well as colchicine  for gout flare.  Patient with resultant ataxic gait, left inattention due to visual field deficits as well as intermittent confusion with poor safety  awareness as well as lack of awareness of deficits. CIR was recommended by rehab team and MD.    Hospital Course: Brian Wolf was admitted to rehab 12/20/2013 for inpatient therapies to consist of PT, ST and OT at least three hours five days a week. Past admission physiatrist, therapy team and rehab RN have worked together to provide customized collaborative inpatient rehab. Mentation has continued to improve and foot pain has resolved. Blood pressure were monitored on tid basis and have been reasonably controlled on HCTZ, metoprolol and cozaar. He was hypokalemic as admission and was maintained on potassium supplement. Follow up labs showed improvement with upward trend therefore this was decreased to 20 meq. He has completed treatment for UTI and reactive leucocytosis has resolved. He is continent of bowel and bladder. Po intake has been good. He was counseled on importance of CPAP use to help decrease stroke risk. He has made great progress during his rehab stay and is currently at modified independent to supervision level. He will continue to receive follow up [place], [place], [place] and [place] by [place] past discharge.     Rehab course: During patient's stay in rehab weekly team conferences were held to monitor patient's progress, set goals and discuss barriers to discharge. Patient has had improvement in activity tolerance, balance, postural control, as well as ability to compensate for deficits.  He is ambulating at supervision to modified independent level with use of RW. He is  able to complete ADL tasks at modified independent level. He requires supervision for cognitive tasks due to decreased mental flexibility, impaired awareness of deficits as well as difficulty with functional problem solving. Family was educated on providing assistance with  medication and financial management.   Disposition:  Home  Diet: Heart healthy.   Special Instructions: 1. No driving till cleared by  neurology    Medication List    STOP taking these medications        aspirin EC 81 MG tablet     chlorpheniramine-HYDROcodone 10-8 MG/5ML Lqcr  Commonly known as:  TUSSIONEX PENNKINETIC ER      TAKE these medications        hydrochlorothiazide 25 MG tablet  Commonly known as:  HYDRODIURIL  Take 1.5 tablets (37.5 mg total) by mouth daily.     losartan 100 MG tablet  Commonly known as:  COZAAR  Take 100 mg by mouth daily.     metoprolol 50 MG tablet  Commonly known as:  LOPRESSOR  Take 50 mg by mouth 2 (two) times daily.     pantoprazole 40 MG tablet  Commonly known as:  PROTONIX  Take 1 tablet (40 mg total) by mouth daily.     potassium chloride SA 20 MEQ tablet  Commonly known as:  K-DUR,KLOR-CON  Take 1 tablet (20 mEq total) by mouth daily.     senna-docusate 8.6-50 MG per tablet  Commonly known as:  Senokot-S  Take 1 tablet by mouth 2 (two) times daily.     simvastatin 40 MG tablet  Commonly known as:  ZOCOR  Take 40 mg by mouth daily.     Travoprost (BAK Free) 0.004 % Soln ophthalmic solution  Commonly known as:  TRAVATAN  Place 1 drop into both eyes at bedtime.           Follow-up Information    Follow up with Charlett Blake, MD On 01/14/2014.   Specialty:  Physical Medicine and Rehabilitation   Why:  Be there at 2:30  for 3 pm  appointment   Contact information:   [place] [address] [phone]       Follow up with Brian Contras, MD. Call today.   Specialties:  Neurology, Radiology   Why:  forfollow up appointment in 4-6 weeks   Contact information:   [address] [place] [address] [phone]       Follow up with Brian Huh, MD On 01/07/2014.   Specialty:  Family Medicine   Why:  APPT @ 3;00 PM for post hospital follow up   Contact information:   [address], [place] [address] [phone]       Signed: Bary Leriche 12/26/2013, 9:26 AM

## 2013-12-26 NOTE — Patient Care Conference (Signed)
Inpatient RehabilitationTeam Conference and Plan of Care Update Date: 12/26/2013   Time: 11;15 AM    Patient Name: Brian Wolf      Medical Record Number: [id number]  Date of Birth: Mar 24, 1931 Sex: Male         Room/Bed: 4M06C/4M06C-01 Payor Info: Payor: Marine scientist / Plan: AARP MEDICARE COMPLETE / Product Type: *No Product type* /    Admitting Diagnosis: CVA  Admit Date/Time:  12/20/2013  4:55 PM Admission Comments: No comment available   Primary Diagnosis:  ICH (intracerebral hemorrhage) Principal Problem: ICH (intracerebral hemorrhage)  Patient Active Problem List   Diagnosis Date Noted  . Homonymous hemianopsia due to recent cerebral infarction 12/24/2013  . Left-sided neglect 12/24/2013  . Gout flare 12/20/2013  . Benign essential HTN 12/20/2013  . Acute hemorrhagic infarction of brain 12/20/2013  . Hemorrhagic stroke   . Other secondary acute gout of left foot   . Hypokalemia 12/19/2013  . Altered mental status   . Encounter for central line placement   . Encephalopathy acute   . ICH (intracerebral hemorrhage) 12/14/2013  . Fever     Expected Discharge Date: Expected Discharge Date: 12/26/13  Team Members Present: Physician leading conference: Dr. Alysia Penna Social Worker Present: Ovidio Kin, LCSW Nurse Present: Heather Roberts, RN PT Present: Billie Ruddy, Renaye Rakers, PT OT Present: Simonne Come, OT SLP Present: Windell Moulding, SLP PPS Coordinator present : Daiva Nakayama, RN, CRRN     Current Status/Progress Goal Weekly Team Focus  Medical     medically stable   ready for dc     Bowel/Bladder   Continent of bowel and bladder; senokot 1 tab BID; LBM 12/25/13; wears briefs  Continent of bowel and bladder  Offer bathroom PRN   Swallow/Nutrition/ Hydration     na        ADL's   Supervision level for bathing and dressing with modified indpendent for toileting.  Supervision for shower transfers.  Still with left visual field deficit.   supervision to modified independent  selfcare retraining, balance re-training, pt/family education   Mobility   supervision-mod I  supervision-mod I   NMR, balance, safety awareness, endurance, stairs, gait, pt/family education, discharge planning   Communication   n/a na        Safety/Cognition/ Behavioral Observations  supervision  no unsafe behaviors supervision   discharge home today, family education is complete at this time.    Pain   Denies  </=3  Assess for pain qshift and PRN   Skin   Bruising to BUE  No new skin breakdown  Assess skin qshift and PRN      *See Care Plan and progress notes for long and short-term goals.  Barriers to Discharge:   no barriers   Possible Resolutions to Barriers:    24 hr care-wife   Discharge Planning/Teaching Needs:  Home with wife who has been trained with therapists and feels comfortable wiht his care. Will have 24 hr superivison level      Team Discussion:  Pt reached goals and family education completed with wife.  Plan for discharge today, no medical issues  Revisions to Treatment Plan:  Met goals ready for discharge today      [name] 12/26/2013, 1:13 PM

## 2013-12-26 NOTE — Discharge Instructions (Signed)
Inpatient Rehab Discharge Instructions  Argelio Dijulio Discharge date and time: 12/26/13   Activities/Precautions/ Functional Status: Activity: activity as tolerated No Driving till cleared by neurology or Dr. Letta Pate  Diet: cardiac diet Wound Care: none needed Functional status:  ___ No restrictions     ___ Walk up steps independently ___ 24/7 supervision/assistance   ___ Walk up steps with assistance _X__ Intermittent supervision/assistance  _X__ Bathe/dress independently ___ Walk with walker     ___ Bathe/dress with assistance ___ Walk Independently    ___ Shower independently ___ Walk with assistance    ___ Shower with assistance _X__ No alcohol     ___ Return to work/school ________  Special Instructions: 1. Do not use aspirin or aspirin containing products. 2. Ok to use tylenol for pain as needed.    COMMUNITY REFERRALS UPON DISCHARGE:    Home Health:   PT, OT, RN, [place] [id number] Date of last service:12/26/2013   Medical Equipment/Items Ordered:ROLLING WALKER, [place]   GENERAL COMMUNITY RESOURCES FOR PATIENT/FAMILY: Support Groups:[facility] SUPPORT GROUP   STROKE/TIA DISCHARGE INSTRUCTIONS SMOKING Cigarette smoking nearly doubles your risk of having a stroke & is the single most alterable risk factor  If you smoke or have smoked in the last 12 months, you are advised to quit smoking for your health.  Most of the excess cardiovascular risk related to smoking disappears within a year of stopping.  Ask you doctor about anti-smoking medications  [place] Quit Line: 1-800-QUIT NOW  Free Smoking Cessation Classes [phone]  CHOLESTEROL Know your levels; limit fat & cholesterol in your diet  Lipid Panel     Component Value Date/Time   CHOL 147 12/15/2013 0342   TRIG 84 12/15/2013 0342   HDL 54 12/15/2013 0342   CHOLHDL 2.7 12/15/2013 0342   VLDL 17 12/15/2013 0342   LDLCALC 76 12/15/2013 0342      Many patients benefit from treatment even if their cholesterol is at goal.  Goal: Total Cholesterol (CHOL) less than 160  Goal:  Triglycerides (TRIG) less than 150  Goal:  HDL greater than 40  Goal:  LDL (LDLCALC) less than 100   BLOOD PRESSURE American Stroke Association blood pressure target is less that 120/80 mm/Hg  Your discharge blood pressure is:  BP: 132/70 mmHg  Monitor your blood pressure  Limit your salt and alcohol intake  Many individuals will require more than one medication for high blood pressure  DIABETES (A1c is a blood sugar average for last 3 months) Goal HGBA1c is under 7% (HBGA1c is blood sugar average for last 3 months)  Diabetes: No known diagnosis of diabetes    Lab Results  Component Value Date   HGBA1C 6.4* 12/15/2013     Your HGBA1c can be lowered with medications, healthy diet, and exercise.  Check your blood sugar as directed by your physician  Call your physician if you experience unexplained or low blood sugars.  PHYSICAL ACTIVITY/REHABILITATION Goal is 30 minutes at least 4 days per week  Activity: No driving, Therapies: See above Return to work:  N/a  Activity decreases your risk of heart attack and stroke and makes your heart stronger.  It helps control your weight and blood pressure; helps you relax and can improve your mood.  Participate in a regular exercise program.  Talk with your doctor about the best form of exercise for you (dancing, walking, swimming, cycling).  DIET/WEIGHT Goal is to  maintain a healthy weight  Your discharge diet is: Diet Heart thin liquids Your height is:  5'5" Your current weight is: 170 lbs Your Body Mass Index (BMI) is:  28.4  Following the type of diet specifically designed for you will help prevent another stroke.  Your goal weight is:  149 lbs  Your goal Body Mass Index (BMI) is 19-24.  Healthy food habits can help reduce 3 risk factors for stroke:  High  cholesterol, hypertension, and excess weight.  RESOURCES Stroke/Support Group:  Call [phone]   STROKE EDUCATION PROVIDED/REVIEWED AND GIVEN TO PATIENT Stroke warning signs and symptoms How to activate emergency medical system (call 911). Medications prescribed at discharge. Need for follow-up after discharge. Personal risk factors for stroke. Pneumonia vaccine given:  Flu vaccine given:  My questions have been answered, the writing is legible, and I understand these instructions.  I will adhere to these goals & educational materials that have been provided to me after my discharge from the hospital.       My questions have been answered and I understand these instructions. I will adhere to these goals and the provided educational materials after my discharge from the hospital.  Patient/Caregiver Signature _______________________________ Date __________  Clinician Signature _______________________________________ Date __________  Please bring this form and your medication list with you to all your follow-up doctor's appointments.

## 2013-12-26 NOTE — Plan of Care (Signed)
Problem: RH KNOWLEDGE DEFICIT Goal: RH STG INCREASE KNOWLEDGE OF HYPERTENSION Patient will be able to explain rationale for [facility] diet and medications to help control blood pressure to avoid bleeding in the brain with cues/reminders/notes  Outcome: Completed/Met Date Met:  12/26/13

## 2013-12-26 NOTE — Progress Notes (Signed)
Subjective/Complaints: Pt without issues overnite Pt without pain, wife coming in to pick up pt Review of Systems - Negative except as above  Objective: Vital Signs: Blood pressure 135/52, pulse 61, temperature 97.5 F (36.4 C), temperature source Oral, resp. rate 17, SpO2 97 %. No results found. Results for orders placed or performed during the hospital encounter of 12/20/13 (from the past 72 hour(s))  CBC     Status: Abnormal   Collection Time: 12/25/13  8:38 AM  Result Value Ref Range   WBC 8.8 4.0 - 10.5 K/uL   RBC 4.05 (L) 4.22 - 5.81 MIL/uL   Hemoglobin 12.4 (L) 13.0 - 17.0 g/dL   HCT 37.6 (L) 39.0 - 52.0 %   MCV 92.8 78.0 - 100.0 fL   MCH 30.6 26.0 - 34.0 pg   MCHC 33.0 30.0 - 36.0 g/dL   RDW 14.1 11.5 - 15.5 %   Platelets 438 (H) 150 - 400 K/uL      Constitutional: He is oriented to person, place, and time. He appears well-developed and well-nourished.  HENT:  Head: Normocephalic and atraumatic.  Eyes: Conjunctivae are normal. Pupils are equal, round, and reactive to light.  Neck: Normal range of motion. Neck supple.  Cardiovascular: Normal rate and regular rhythm.  Respiratory: Effort normal and breath sounds normal. No respiratory distress. He has no wheezes.  GI: Soft. Bowel sounds are normal.  Musculoskeletal:  No pain in feet, no pain with UE and LE AROM Neurological: He is alert and oriented to person, place, and time.  Speech without dysarthria   Skin: Skin is warm and dry.  Psychiatric: He has a normal mood and affect. His behavior is normal. Thought content normal.  Motor strength is 5/5 bilateral deltoid, biceps, triceps, grip, hip flexors, knee extensors, ankle dorsi flexors and plantar flexors Left  neglect Assessment/Plan: 1. Functional deficits secondary to Right temporal -occipital hemorrhagic infarct Stable for D/C today F/u PCP in 1-2 weeks F/u PM&R 3 weeks F/u Neuro 1-2 mo   See D/C summary See D/C instructions  No  driving FIM: FIM - Bathing Bathing Steps Patient Completed: Chest, Right Arm, Left Arm, Abdomen, Front perineal area, Buttocks, Right upper leg, Left upper leg, Left lower leg (including foot), Right lower leg (including foot) Bathing: 5: Supervision: Safety issues/verbal cues  FIM - Upper Body Dressing/Undressing Upper body dressing/undressing steps patient completed: Thread/unthread right sleeve of pullover shirt/dresss, Thread/unthread left sleeve of pullover shirt/dress, Put head through opening of pull over shirt/dress, Pull shirt over trunk Upper body dressing/undressing: 5: Supervision: Safety issues/verbal cues FIM - Lower Body Dressing/Undressing Lower body dressing/undressing steps patient completed: Thread/unthread right pants leg, Thread/unthread left pants leg, Don/Doff right shoe, Don/Doff left shoe, Fasten/unfasten right shoe, Fasten/unfasten left shoe, Don/Doff right sock, Don/Doff left sock, Pull pants up/down, Thread/unthread right underwear leg, Thread/unthread left underwear leg, Pull underwear up/down Lower body dressing/undressing: 5: Supervision: Safety issues/verbal cues  FIM - Toileting Toileting steps completed by patient: Adjust clothing prior to toileting, Performs perineal hygiene, Adjust clothing after toileting Toileting Assistive Devices: Grab bar or rail for support Toileting: 5: Supervision: Safety issues/verbal cues  FIM - Radio producer Devices: Elevated toilet seat, Grab bars Toilet Transfers: 5-To toilet/BSC: Supervision (verbal cues/safety issues), 5-From toilet/BSC: Supervision (verbal cues/safety issues)  FIM - Control and instrumentation engineer Devices: Copy: 6: Supine > Sit: No assist, 6: Sit > Supine: No assist, 6: Bed > Chair or W/C: No assist, 6: Chair or W/C >  Bed: No assist  FIM - Locomotion: Wheelchair Distance: 150 Locomotion: Wheelchair: 0: Activity did not occur (pt  ambulatory) FIM - Locomotion: Ambulation Locomotion: Ambulation Assistive Devices: Administrator Ambulation/Gait Assistance: 6: Modified independent (Device/Increase time) Locomotion: Ambulation: 6: Travels 150 ft or more with assistive device/no helper  Comprehension Comprehension Mode: Auditory Comprehension: 5-Follows basic conversation/direction: With no assist  Expression Expression Mode: Verbal Expression: 5-Expresses basic needs/ideas: With no assist  Social Interaction Social Interaction: 5-Interacts appropriately 90% of the time - Needs monitoring or encouragement for participation or interaction.  Problem Solving Problem Solving: 5-Solves complex 90% of the time/cues < 10% of the time  Memory Memory: 5-Requires cues to use assistive device   Medical Problem List and Plan: 1. Functional deficits secondary to Right temporal -occipital hemorrhagic infarct 2. DVT Prophylaxis/Anticoagulation: Mechanical: Antiembolism stockings, knee (TED hose) Bilateral lower extremities Sequential compression devices, below knee Bilateral lower extremities 3. Pain Management: tylenol prn for headaches as well as foot pain.  4. Mood: Team to provide ego support and reinforcement. LCSW to follow for evaluation and support.  5. Neuropsych: This patient is is capable of making decisions on his own behalf. 6. Skin/Wound Care: Routine  7. Fluids/Electrolytes/Nutrition: Monitor I/O. Family providing food from home to help with intake. Offer supplements as needed.  8. HTN: Will monitor every 8 hours. Avoid hypotension to allow adequate perfusion BP goal< 180. Continue HCTZ, Metoprolol and Cozaar.  9. Enterococcus UTI: Antibiotic completed levaquin, 10. Hypokalemia: Due to diuretics as well as IVF. Continue supplement with caution as on Cozaar. Will recheck lytes in am.  11. Gout flare: Continue colchicine bid.  12. Reactive leucocytosis: Likely due to UTI. Resolving.  repeat CBC to  see if WBC normalized 13. ABLA: Has had a drop in H/H from 13.5-->10.5.Stabilized at 12.5                                                                               14.  Sleep apnea, non compliant with CPAP, has unit at home which he doesn't use, discussed that noncompliance can increase stroke risk LOS (Days) 6 A FACE TO FACE EVALUATION WAS PERFORMED  Cherolyn Behrle E 12/26/2013, 7:59 AM

## 2013-12-26 NOTE — Progress Notes (Signed)
Discharge to home accompanied by wife and son. [place] instructions given by Algis Liming PAC. Son asked for gait belt and home exercise program. Instructed available belt in gift shop and home exercises given to wife. No other questions noted. Margarito Liner

## 2014-01-14 ENCOUNTER — Encounter: Payer: Medicare Other | Attending: Physical Medicine & Rehabilitation

## 2014-01-14 ENCOUNTER — Ambulatory Visit (HOSPITAL_BASED_OUTPATIENT_CLINIC_OR_DEPARTMENT_OTHER): Payer: Medicare Other | Admitting: Physical Medicine & Rehabilitation

## 2014-01-14 ENCOUNTER — Encounter: Payer: Self-pay | Admitting: Physical Medicine & Rehabilitation

## 2014-01-14 VITALS — BP 143/52 | HR 53 | Resp 14 | Wt 171.6 lb

## 2014-01-14 DIAGNOSIS — H53462 Homonymous bilateral field defects, left side: Secondary | ICD-10-CM | POA: Insufficient documentation

## 2014-01-14 DIAGNOSIS — I611 Nontraumatic intracerebral hemorrhage in hemisphere, cortical: Secondary | ICD-10-CM | POA: Insufficient documentation

## 2014-01-14 DIAGNOSIS — I6931 Cognitive deficits following cerebral infarction: Secondary | ICD-10-CM | POA: Diagnosis not present

## 2014-01-14 DIAGNOSIS — I10 Essential (primary) hypertension: Secondary | ICD-10-CM | POA: Diagnosis not present

## 2014-01-14 DIAGNOSIS — H53469 Homonymous bilateral field defects, unspecified side: Secondary | ICD-10-CM | POA: Insufficient documentation

## 2014-01-14 DIAGNOSIS — I69354 Hemiplegia and hemiparesis following cerebral infarction affecting left non-dominant side: Secondary | ICD-10-CM | POA: Diagnosis not present

## 2014-01-14 NOTE — Progress Notes (Signed)
Subjective:    Patient ID: Brian Wolf, male    DOB: 29-Jun-1931, 80 y.o.   MRN: [id number]  HPI 80 y.o. LH- male who was admitted from PCP office on 11/13 with complaints of difficulty walking, difficulty talking as well as "not acting right". On arrival to ED initial CT scan showed a large right acute hemorraghic infarct.   Patient with lethargy as well as apneic episodes on 11/14 and repeat CT head was with evolving right parietal IPH with mild shift and mild right lateral ventricle effacement and was started on hypertonic saline for edema.  MRI/MRA  brain with Large RIGHT temporal occipital lobar hemorrhage with surrounding edema and subcentimeter of acute infarct right lateral temporal lobe. Mentation has improved and patient taken off bedrest 11/16 with initiation of therapy. He was started on cipro for enterobacter UTI as well as colchicine  for gout flare.  Patient with resultant ataxic gait, left inattention due to visual field deficits as well as intermittent confusion with poor safety awareness   Brian Wolf was admitted to rehab 12/20/2013 for inpatient therapies to consist of PT, ST and OT at least three hours five days a week. Past admission physiatrist, therapy team and rehab RN have worked together to provide customized collaborative inpatient rehab. Mentation has continued to improve and foot pain has resolved. Blood pressure were monitored on tid basis and have been reasonably controlled on HCTZ, metoprolol and cozaar. He was hypokalemic as admission and was maintained on potassium supplement. Follow up labs showed improvement with upward trend therefore this was decreased to 20 meq. He has completed treatment for UTI and reactive leucocytosis has resolved. He is continent of bowel and bladder. Po intake has been good  Admit date: 12/20/2013 Discharge date: 12/26/2013  Rehabilitation he returned to home and received home health PT, OT, speech therapy, nurse and was just  discharged last Thursday  Saw primary care physician last week. Patient was taken off of potassium. Also was told to finish current reflux medicine and then switched to Zantac  No falls at home, not using assistive device, showered himself this morning., Dresses himself  Pain Inventory Average Pain 0 Pain Right Now 0 My pain is n/a  In the last 24 hours, has pain interfered with the following? General activity 0 Relation with others 0 Enjoyment of life 0 What TIME of day is your pain at its worst? n/a Sleep (in general) NA  Pain is worse with: n/a Pain improves with: n/a Relief from Meds: n/a  Mobility use a cane how many minutes can you walk? 10-15 ability to climb steps?  yes  Function retired I need assistance with the following:  shopping  Neuro/Psych No problems in this area  Prior Studies Any changes since last visit?  no  Physicians involved in your care Any changes since last visit?  no   Family History  Problem Relation Age of Onset  . Hyperlipidemia Mother   . Hypertension Mother   . Hypertension Father    History   Social History  . Marital Status: Married    Spouse Name: N/A    Number of Children: N/A  . Years of Education: N/A   Social History Main Topics  . Smoking status: Never Smoker   . Smokeless tobacco: Never Used  . Alcohol Use: No  . Drug Use: None  . Sexual Activity: None   Other Topics Concern  . None   Social History Narrative   Past Surgical History  Procedure Laterality Date  . Abdominal surgery     Past Medical History  Diagnosis Date  . Hypertension    BP 143/52 mmHg  Pulse 53  Resp 14  Wt 171 lb 9.6 oz (77.837 kg)  SpO2 97%  Opioid Risk Score:   Fall Risk Score: High Fall Risk (>13 points) (educated and given handout on fall prevention in the home)  Review of Systems  All other systems reviewed and are negative.      Objective:   Physical Exam  Constitutional: He is oriented to person, place, and  time. He appears well-developed and well-nourished.  HENT:  Head: Normocephalic and atraumatic.  Eyes: Conjunctivae and EOM are normal. Pupils are equal, round, and reactive to light.  Neck: Normal range of motion.  Neurological: He is alert and oriented to person, place, and time. He has normal reflexes. Gait abnormal.  Motor strength is 5/5 bilateral deltoid, biceps, triceps, grip, hip flexor, knee extensor, ankle dorsiflexor and plantar flexor Gait is wide based but no evidence of toe drag or knee instability Visual fields show left homonymous hemianopsia with confrontation testing  Sensation intact to light touch bilateral upper extremities No evidence of tactile neglect  Psychiatric: He has a normal mood and affect.  Nursing note and vitals reviewed.         Assessment & Plan:  1. Right temporal occipital Hemorrhagic infarct. Main residual is left homonomous hemianopsia. We discussed typical time course recovery period we should know by about 6 months Post stroke whether this improves. He has artery seen ophthalmologist and has had visual field testing with a follow-up scheduled in 3 more months.  Do not think patient needs additional PT OT or speech therapy is made very good improvements and per his wife is back to baseline. He had already quit driving prior to his stroke so this is not a major issue for him. He has had no falls or other safety issues thus far.  PM&R follow-up when necessary Follow-up with neurology in 6 weeks Ophthalmology follow-up in approximately 12 weeks  Discussed exercise recommendations especially regular walking program at least 30 minutes per day

## 2014-01-14 NOTE — Patient Instructions (Signed)
Walk 30 minutes per day Follow-up neurology and primary care

## 2014-03-05 ENCOUNTER — Ambulatory Visit (INDEPENDENT_AMBULATORY_CARE_PROVIDER_SITE_OTHER): Payer: 59 | Admitting: Neurology

## 2014-03-05 ENCOUNTER — Encounter: Payer: Self-pay | Admitting: Neurology

## 2014-03-05 VITALS — BP 182/92 | HR 68 | Ht 65.25 in | Wt 170.6 lb

## 2014-03-05 DIAGNOSIS — H53462 Homonymous bilateral field defects, left side: Secondary | ICD-10-CM

## 2014-03-05 NOTE — Progress Notes (Signed)
[facility] [address] [place]. [place] [address] [phone]       OFFICE FOLLOW-UP NOTE  [name] Date of Birth:  [birth year] Medical Record Number:  [id number]   HPI:  77 year Asian American male seen for first office follow-up visit following hospital admission for intracerebral hemorrhage on 12/14/13. He presented with sudden onset of complaints of difficulty walking, bumping into objects, not acting right and some difficulty talking. On arrival at the emergency room CT scan of the head showed a right parietal hemorrhage with volume of 21 cubic cc with slight intraventricular extension and mild cytotoxic edema. Patient was admitted to the intensive care unit and started on hypertonic saline to control cerebral edema and strict blood pressure control and close neurological monitoring. He remained stable on neurological exam as well as on subsequent brain imaging. MRI scan personally reviewed by me showed a solitary parenchymal right parietal hematoma without evidence of amyloid angiopathy or microhemorrhages. MRA incidental brain showed no evidence of aneurysm or AVM. Hospital course was complicated by Enterobacter urinary tract infection as well as gout flareup which were treated. He had ataxic gait with left-sided inattention and visual field deficits and intermittent confusion. He was transferred to inpatient rehabilitation where he stayed for one week and made significant improvement and has been discharged home. He is accompanied today by his wife states that his left-sided neglect and weakness as well as gait has significantly improved. He still has a partial left-sided homonymous hemianopsia and he did undergo formal visual field testing in her ophthalmologist office which documented slight improvement. He has not been driving. He states his blood pressure has been well controlled but it is slightly with elevated in office. He has been having some occasional  mild short-term memory difficulties but is doing quite well and is independent in activities of daily living. He still not driving but wife can leave him alone unsupervised for 2-3 hours.  ROS:   14 system review of systems is positive for chills, cold intolerance, vision difficulties, gait difficulties, apnea, daytime sleepiness, snoring and all other systems negative  PMH:  Past Medical History  Diagnosis Date  . Hypertension     Social History:  History   Social History  . Marital Status: Married    Spouse Name: N/A    Number of Children: N/A  . Years of Education: N/A   Occupational History  . Not on file.   Social History Main Topics  . Smoking status: Never Smoker   . Smokeless tobacco: Never Used  . Alcohol Use: No  . Drug Use: Not on file  . Sexual Activity: Not on file   Other Topics Concern  . Not on file   Social History Narrative    Medications:   Current Outpatient Prescriptions on File Prior to Visit  Medication Sig Dispense Refill  . hydrochlorothiazide (HYDRODIURIL) 25 MG tablet Take 1.5 tablets (37.5 mg total) by mouth daily. 45 tablet 1  . losartan (COZAAR) 100 MG tablet Take 100 mg by mouth daily.    . metoprolol (LOPRESSOR) 50 MG tablet Take 50 mg by mouth 2 (two) times daily.    . simvastatin (ZOCOR) 40 MG tablet Take 40 mg by mouth daily.    . Travoprost, BAK Free, (TRAVATAN) 0.004 % SOLN ophthalmic solution Place 1 drop into both eyes at bedtime.     No current facility-administered medications on file prior to visit.    Allergies:  No Known Allergies  Physical  Exam General: well developed, well nourished, seated, in no evident distress Head: head normocephalic and atraumatic.  Neck: supple with no carotid or supraclavicular bruits Cardiovascular: regular rate and rhythm, no murmurs Musculoskeletal: no deformity Skin:  no rash/petichiae Vascular:  Normal pulses all extremities Filed Vitals:   03/05/14 1301  BP: 182/92  Pulse: 68    Neurologic Exam Mental Status: Awake and fully alert. Oriented to place and time. Recent and remote memory intact. 3/3 recall Attention span, concentration and fund of knowledge appropriate. Mood and affect appropriate.  Cranial Nerves: Fundoscopic exam reveals sharp disc margins. Pupils equal, briskly reactive to light. Extraocular movements full without nystagmus. Visual fields full to confrontation. Hearing intact. Facial sensation intact.Mild left lower  Face weakness., Tongue, palate moves normally and symmetrically.  Motor: Normal bulk and tone. Normal strength in all tested extremity muscles. Diminished fine finger movements on the left. Orbits right over left upper extremity. Minimum weakness of left grip. Sensory.: intact to touch ,pinprick .position and vibratory sensation.  Coordination: Rapid alternating movements normal in all extremities. Finger-to-nose and heel-to-shin performed accurately bilaterally. Gait and Station: Arises from chair without difficulty. Stance is normal. Gait demonstrates normal stride length and balance . Unable to heel, toe and tandem walk without difficulty.  Reflexes: 1+ and symmetric. Toes downgoing.   NIHSS  3 Modified Rankin  2   ASSESSMENT: 78 year Asian male with right parietal intracerebral hemorrhage in November 2015 likely from malignant hypertension. He has still significant residual left homonymous hemianopsia but is otherwise doing well     PLAN: I had a long d/w patient and his wife about his recent brain hemorrhage, risk for recurrent stroke/TIAs, personally independently reviewed imaging studies and stroke evaluation results and answered questions.Start aspirin 81 mg daily  for secondary stroke prevention and maintain strict control of hypertension with blood pressure goal below 130/90, diabetes with hemoglobin A1c goal below 6.5% and lipids with LDL cholesterol goal below 100 mg/dL. I also advised the patient to eat a healthy diet with  plenty of whole grains, cereals, fruits and vegetables, exercise regularly and maintain ideal body weight. I advised him not to drive due to his significant persistent left visual field deficit. He may have cataract surgery done 3 months after his brain hemorrhage. Followup in the future with Ward Givens, NP in 3 months or call earlier if necessary.    Note: This document was prepared with digital dictation and possible smart phrase technology. Any transcriptional errors that result from this process are unintentional

## 2014-03-05 NOTE — Patient Instructions (Signed)
I had a long d/w patient and his wife about his recent brain hemorrhage, risk for recurrent stroke/TIAs, personally independently reviewed imaging studies and stroke evaluation results and answered questions.Start aspirin 81 mg daily  for secondary stroke prevention and maintain strict control of hypertension with blood pressure goal below 130/90, diabetes with hemoglobin A1c goal below 6.5% and lipids with LDL cholesterol goal below 100 mg/dL. I also advised the patient to eat a healthy diet with plenty of whole grains, cereals, fruits and vegetables, exercise regularly and maintain ideal body weight. I advised him not to drive due to his significant persistent left visual field deficit. He may have cataract surgery done 3 months after his brain hemorrhage. Followup in the future with Ward Givens, NP in 3 months or call earlier if necessary.

## 2014-05-08 ENCOUNTER — Ambulatory Visit: Payer: 59 | Admitting: Adult Health

## 2014-05-09 ENCOUNTER — Encounter: Payer: Self-pay | Admitting: Adult Health

## 2014-05-09 ENCOUNTER — Ambulatory Visit (INDEPENDENT_AMBULATORY_CARE_PROVIDER_SITE_OTHER): Payer: Medicare Other | Admitting: Adult Health

## 2014-05-09 VITALS — BP 164/72 | HR 45 | Ht 65.0 in | Wt 168.0 lb

## 2014-05-09 DIAGNOSIS — Z8673 Personal history of transient ischemic attack (TIA), and cerebral infarction without residual deficits: Secondary | ICD-10-CM

## 2014-05-09 DIAGNOSIS — H53469 Homonymous bilateral field defects, unspecified side: Secondary | ICD-10-CM

## 2014-05-09 DIAGNOSIS — Z87898 Personal history of other specified conditions: Secondary | ICD-10-CM

## 2014-05-09 DIAGNOSIS — I69398 Other sequelae of cerebral infarction: Secondary | ICD-10-CM | POA: Diagnosis not present

## 2014-05-09 DIAGNOSIS — G479 Sleep disorder, unspecified: Secondary | ICD-10-CM | POA: Insufficient documentation

## 2014-05-09 DIAGNOSIS — I10 Essential (primary) hypertension: Secondary | ICD-10-CM | POA: Insufficient documentation

## 2014-05-09 DIAGNOSIS — Z8669 Personal history of other diseases of the nervous system and sense organs: Secondary | ICD-10-CM

## 2014-05-09 NOTE — Progress Notes (Addendum)
PATIENT: Brian Wolf DOB: 02-Sep-1931  REASON FOR VISIT: follow up- hx of stroke HISTORY FROM: patient  HISTORY OF PRESENT ILLNESS: [name] is an 81 year old male with a history of intracerebral hemorrhage in November 2015. He returns today for follow-up. The patient is currently taking aspirin 81 mg daily. Patient blood pressure is slightly elevated today. He states that his primary care has been managing his blood pressure. He states that the home health nurse that came to visit recommended that his metoprolol dose be decreased due to his heart rate. His heart rate is slightly lower today. Patient's primary care also manages his cholesterol he is currently on Zocor. The patient's left-sided weakness has resolved. He states that he still has some slight visual loss in the left lower peripheral field. The patient continues to not operate a motor vehicle. He has an appointment with his ophthalmologist next week. He states he may have surgery on the right eye due to cataract. The patient can complete all ADLs independently. Denies any changes with his gait or balance. Denies any falls. Patient also states that he has a history of obstructive sleep apnea he was on the CPAP machine but has not used that for several years. He is wondering if he needs to have another sleep study. He denies any strokelike symptoms. Any new neurological symptoms. Denies any new medical issues. He returns today for follow-up.  HISTORY 03/05/14 (SETHI): 48 year Asian American male seen for first office follow-up visit following hospital admission for intracerebral hemorrhage on 12/14/13. He presented with sudden onset of complaints of difficulty walking, bumping into objects, not acting right and some difficulty talking. On arrival at the emergency room CT scan of the head showed a right parietal hemorrhage with volume of 21 cubic cc with slight intraventricular extension and mild cytotoxic edema. Patient was admitted to the  intensive care unit and started on hypertonic saline to control cerebral edema and strict blood pressure control and close neurological monitoring. He remained stable on neurological exam as well as on subsequent brain imaging. MRI scan personally reviewed by me showed a solitary parenchymal right parietal hematoma without evidence of amyloid angiopathy or microhemorrhages. MRA incidental brain showed no evidence of aneurysm or AVM. Hospital course was complicated by Enterobacter urinary tract infection as well as gout flareup which were treated. He had ataxic gait with left-sided inattention and visual field deficits and intermittent confusion. He was transferred to inpatient rehabilitation where he stayed for one week and made significant improvement and has been discharged home. He is accompanied today by his wife states that his left-sided neglect and weakness as well as gait has significantly improved. He still has a partial left-sided homonymous hemianopsia and he did undergo formal visual field testing in her ophthalmologist office which documented slight improvement. He has not been driving. He states his blood pressure has been well controlled but it is slightly with elevated in office. He has been having some occasional mild short-term memory difficulties but is doing quite well and is independent in activities of daily living. He still not driving but wife can leave him alone unsupervised for 2-3 hours.   REVIEW OF SYSTEMS: Out of a complete 14 system review of symptoms, the patient complains only of the following symptoms, and all other reviewed systems are negative.  Apnea  ALLERGIES: No Known Allergies  HOME MEDICATIONS: Outpatient Prescriptions Prior to Visit  Medication Sig Dispense Refill  . hydrochlorothiazide (HYDRODIURIL) 25 MG tablet Take 1.5 tablets (  37.5 mg total) by mouth daily. 45 tablet 1  . losartan (COZAAR) 100 MG tablet Take 100 mg by mouth daily.    . metoprolol  (LOPRESSOR) 50 MG tablet Take 50 mg by mouth 2 (two) times daily.    . ranitidine (ZANTAC) 150 MG tablet Take 150 mg by mouth 2 (two) times daily.    . simvastatin (ZOCOR) 40 MG tablet Take 40 mg by mouth daily.    . Travoprost, BAK Free, (TRAVATAN) 0.004 % SOLN ophthalmic solution Place 1 drop into both eyes at bedtime.     No facility-administered medications prior to visit.    PAST MEDICAL HISTORY: Past Medical History  Diagnosis Date  . Hypertension     PAST SURGICAL HISTORY: Past Surgical History  Procedure Laterality Date  . Abdominal surgery      FAMILY HISTORY: Family History  Problem Relation Age of Onset  . Hyperlipidemia Mother   . Hypertension Mother   . Hypertension Father     SOCIAL HISTORY: History   Social History  . Marital Status: Married    Spouse Name: N/A  . Number of Children: N/A  . Years of Education: N/A   Occupational History  . Not on file.   Social History Main Topics  . Smoking status: Never Smoker   . Smokeless tobacco: Never Used  . Alcohol Use: No  . Drug Use: No  . Sexual Activity: Not on file   Other Topics Concern  . Not on file   Social History Narrative   Patient lives at home with his wife [name].   Retired.   Education Retail buyer.   Right handed.   Caffeine two cups of coffee daily.      PHYSICAL EXAM  Filed Vitals:   05/09/14 0943  BP: 164/72  Pulse: 45  Height: 5\' 5"  (1.651 m)  Weight: 168 lb (76.204 kg)   Body mass index is 27.96 kg/(m^2).  Generalized: Well developed, in no acute distress   Neurological examination  Mentation: Alert oriented to time, place, history taking. Follows all commands speech and language fluent Cranial nerve II-XII: Pupils were equal round reactive to light. Extraocular movements were full, visual field were full on confrontational test. Mild Visual loss in the left lower peripheral field. Facial sensation and strength were normal. Uvula tongue midline. Head turning and  shoulder shrug  were normal and symmetric. Motor: The motor testing reveals 5 over 5 strength of all 4 extremities. Good symmetric motor tone is noted throughout.  Sensory: Sensory testing is intact to soft touch on all 4 extremities. No evidence of extinction is noted.  Coordination: Cerebellar testing reveals good finger-nose-finger and heel-to-shin bilaterally.  Gait and station: Gait is normal. Tandem gait is normal. Romberg is negative. No drift is seen.  Reflexes: Deep tendon reflexes are symmetric and normal bilaterally.    DIAGNOSTIC DATA (LABS, IMAGING, TESTING) - I reviewed patient records, labs, notes, testing and imaging myself where available.  Lab Results  Component Value Date   WBC 8.8 12/25/2013   HGB 12.4* 12/25/2013   HCT 37.6* 12/25/2013   MCV 92.8 12/25/2013   PLT 438* 12/25/2013      Component Value Date/Time   NA 134* 12/21/2013 1026   K 4.3 12/21/2013 1026   CL 94* 12/21/2013 1026   CO2 28 12/21/2013 1026   GLUCOSE 129* 12/21/2013 1026   BUN 19 12/21/2013 1026   CREATININE 0.96 12/21/2013 1026   CALCIUM 8.7 12/21/2013 1026   PROT 7.2 12/21/2013  1026   ALBUMIN 2.6* 12/21/2013 1026   AST 28 12/21/2013 1026   ALT 32 12/21/2013 1026   ALKPHOS 70 12/21/2013 1026   BILITOT 0.4 12/21/2013 1026   GFRNONAA 76* 12/21/2013 1026   GFRAA 88* 12/21/2013 1026   Lab Results  Component Value Date   CHOL 147 12/15/2013   HDL 54 12/15/2013   LDLCALC 76 12/15/2013   TRIG 84 12/15/2013   CHOLHDL 2.7 12/15/2013   Lab Results  Component Value Date   HGBA1C 6.4* 12/15/2013   Lab Results  Component Value Date   [id number] 12/15/2013   Lab Results  Component Value Date   TSH 1.820 12/15/2013      ASSESSMENT AND PLAN 81 y.o. year old male  has a past medical history of Hypertension. here with:  1. History of stroke  Continue  aspirin 81 mg daily for secondary stroke prevention and maintain strict control of hypertension with blood pressure goal below  130/90, diabetes with hemoglobin A1c goal below 6.5% and lipids with LDL cholesterol goal below 100 mg/dL. Continue with light exercise and monitor your diet. I will refer you for a sleep study. Consult with her PCP regarding your low heart rate. If you develop any stroke like symptoms call 911 immediately. Follow-up in 6 months or sooner if needed.    Ward Givens, MSN, NP-C 05/09/2014, 9:48 AM [facility] [address] [place], [place] [address] [phone]  Note: This document was prepared with digital dictation and possible smart phrase technology. Any transcriptional errors that result from this process are unintentional.   Agree with history, physical, neuro exam,assessment and plan as stated above.    Sarina Ill, MD Stroke Neurology [phone] [facility]

## 2014-05-09 NOTE — Patient Instructions (Addendum)
Continue aspirin 81 mg daily for secondary stroke prevention and maintain strict control of hypertension with blood pressure goal below 130/90, diabetes with hemoglobin A1c goal below 6.5% and lipids with LDL cholesterol goal below 100 mg/dL. Continue with light exercise and monitor diet. Follow-up with eye MD. If you have any stroke like symptoms call 911 immediately.  Follow- up with PCP regarding low heart rate. Referred for sleep study. Our office will call you.

## 2014-06-04 ENCOUNTER — Ambulatory Visit (INDEPENDENT_AMBULATORY_CARE_PROVIDER_SITE_OTHER): Payer: Medicare Other | Admitting: Neurology

## 2014-06-04 ENCOUNTER — Encounter: Payer: Self-pay | Admitting: Neurology

## 2014-06-04 VITALS — BP 168/69 | HR 48 | Resp 16 | Ht 65.0 in | Wt 170.0 lb

## 2014-06-04 DIAGNOSIS — G4733 Obstructive sleep apnea (adult) (pediatric): Secondary | ICD-10-CM

## 2014-06-04 DIAGNOSIS — I69398 Other sequelae of cerebral infarction: Secondary | ICD-10-CM

## 2014-06-04 DIAGNOSIS — H53469 Homonymous bilateral field defects, unspecified side: Secondary | ICD-10-CM

## 2014-06-04 DIAGNOSIS — I619 Nontraumatic intracerebral hemorrhage, unspecified: Secondary | ICD-10-CM

## 2014-06-04 DIAGNOSIS — I610 Nontraumatic intracerebral hemorrhage in hemisphere, subcortical: Secondary | ICD-10-CM | POA: Diagnosis not present

## 2014-06-04 NOTE — Progress Notes (Addendum)
Subjective:    Patient ID: Brian Wolf is a 81 y.o. male.  HPI     Star Age, MD, PhD [facility] [address], [address] [place], [place] [address]  Dear Jinny Blossom and Mamie Nick,   I saw your patient, Brian Wolf, upon your kind request in my clinic today for initial consultation of his sleep disorder, in particular reevaluation of his obstructive sleep apnea. The patient is accompanied by his wife today. As you know, Brian Wolf is a very friendly 81 year old right-handed gentleman with an underlying medical history of hypertension, overweight state, and stroke secondary to right parietal intracerebral hemorrhage with residual left homonymous hemianopsia, who reports a prior diagnosis of obstructive sleep apnea. He was given a CPAP machine but has not used it in years. In fact, he does not currently have a CPAP machine. His sleep study may have been 35 years ago or so. He came to the [place] at the age of 77 from [place]. 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 He also has cataracts. He had cataract extraction to the left eye in October but then suffered a stroke in November 2015 and his right eye cataract surgery was postponed from December 2 later this month. He has an appointment on 06/18/2014 for cataract surgery but would like to see if he can have a sleep study first. He would be willing to try CPAP again. He does snore. He goes to bed late, around 1 AM typically. He sleeps till 9:30 or 10 or so. He drinks 2 cups of coffee in the morning. He does not smoke. He does not drink alcohol. He likes to cook. He is currently not driving. He is walking without a cane or any walking aid. His metoprolol was reduced recently because of low pulse rate. His blood pressure has crept up a little bit. He takes every day. He has not had any new neurological symptoms. He typically does not have a headache but at the  time of his stroke diagnosis he had a right parietal headache, especially when coughing. I reviewed hospital records from his admission in November 2015. He was admitted on 12/14/2013 through 12/20/2013 and discharged to rehabilitation for he stayed from 12/20/2013 through 12/26/2013. He had an MRI brain without contrast on 12/17/2013: Large RIGHT temporal occipital lobar hemorrhage with surrounding edema. Approximate volume 21 mL. Probable trace subarachnoid and intraventricular blood. Probable lobar hypertensive bleed. Cerebral amyloid angiopathy not excluded. No obvious underlying mass or vascular malformation.  Subcentimeter focus of acute infarction RIGHT lateral temporal lobe, remote from the hemorrhage.  No intracranial flow limiting stenosis or vascular malformation.  He has a family history of stroke. He's not sure if there is a family history of obstructive sleep apnea. He has 2 grown children from his first marriage. He has been married for 29 years to his current wife.  His Epworth sleepiness score is 22 out of 24 today and fatigue score is 55, both elevated.  His Past Medical History Is Significant For: Past Medical History  Diagnosis Date  . Hypertension   . Sleep difficulties   . CVA (cerebral infarction) 12/2013    His Past Surgical History Is Significant For: Past Surgical History  Procedure Laterality Date  . Abdominal surgery    . Cataract extraction Left     His Family History Is Significant For: Family History  Problem Relation Age of Onset  .  Hyperlipidemia Mother   . Hypertension Mother   . Hypertension Father     His Social History Is Significant For: History   Social History  . Marital Status: Married    Spouse Name: [name]  . Number of Children: 2  . Years of Education: college   Occupational History  .      retired   Social History Main Topics  . Smoking status: Never Smoker   . Smokeless tobacco: Never Used  . Alcohol Use: No  .  Drug Use: No  . Sexual Activity: Not on file   Other Topics Concern  . None   Social History Narrative   Patient lives at home with his wife [name].   Retired.   Education Retail buyer.   Right handed.   Caffeine two cups of coffee daily.    His Allergies Are:  No Known Allergies:   His Current Medications Are:  Outpatient Encounter Prescriptions as of 06/04/2014  Medication Sig  . aspirin 81 MG tablet Take 81 mg by mouth daily.  . hydrochlorothiazide (HYDRODIURIL) 25 MG tablet Take 1.5 tablets (37.5 mg total) by mouth daily.  Marland Kitchen losartan (COZAAR) 100 MG tablet Take 100 mg by mouth daily.  . metoprolol (LOPRESSOR) 50 MG tablet Take 50 mg by mouth 2 (two) times daily.  . Probiotic Product (PROBIOTIC DAILY PO) Take by mouth daily.  . ranitidine (ZANTAC) 150 MG tablet Take 150 mg by mouth 2 (two) times daily.  . simvastatin (ZOCOR) 40 MG tablet Take 40 mg by mouth daily.  . Travoprost, BAK Free, (TRAVATAN) 0.004 % SOLN ophthalmic solution Place 1 drop into both eyes at bedtime.   No facility-administered encounter medications on file as of 06/04/2014.  :  Review of Systems:  Out of a complete 14 point review of systems, all are reviewed and negative with the exception of these symptoms as listed below:   Review of Systems  Constitutional: Positive for fever, chills and fatigue.  Respiratory: Positive for wheezing.        Snoring   Neurological:       Wakes up a couple of times during the night, snore, witnessed apnea, day time tiredness    Objective:  Neurologic Exam  Physical Exam Physical Examination:   Filed Vitals:   06/04/14 1443  BP: 168/69  Pulse: 48  Resp: 16   General Examination: The patient is a very pleasant 81 y.o. male in no acute distress. He appears well-developed and well-nourished and well groomed.   HEENT: Normocephalic, atraumatic, pupils are equal, round and reactive to light and accommodation. Funduscopic exam is normal with sharp disc margins  noted on the left. He is status post cataract surgery in the left and has a fairly dense cataract on the right. Extraocular tracking is good without limitation to gaze excursion or nystagmus noted. Normal smooth pursuit is noted. On visual field testing, he has some decrease in the left visual field on finger perimetry. Hearing is grossly intact. Face is symmetric with normal facial animation and normal facial sensation. Speech is clear with no dysarthria noted. There is no hypophonia. There is no lip, neck/head, jaw or voice tremor. Neck is supple with full range of passive and active motion. There are no carotid bruits on auscultation. Oropharynx exam reveals: mild mouth dryness, adequate dental hygiene and moderate airway crowding, due to redundant soft palate. Mallampati is class III. Tongue protrudes centrally and palate elevates symmetrically. Tonsils are absent. Neck size is 15.5 inches. He has  a Mild overbite. Nasal inspection reveals no significant nasal mucosal bogginess or redness and no septal deviation.   Chest: Clear to auscultation without wheezing, rhonchi or crackles noted.  Heart: S1+S2+0, regular and normal without murmurs, rubs or gallops noted.   Abdomen: Soft, non-tender and non-distended with normal bowel sounds appreciated on auscultation.  Extremities: There is no pitting edema in the distal lower extremities bilaterally. Pedal pulses are intact.  Skin: Warm and dry without trophic changes noted. There are no varicose veins.  Musculoskeletal: exam reveals no obvious joint deformities, tenderness or joint swelling or erythema.   Neurologically:  Mental status: The patient is awake, alert and oriented in all 4 spheres. His immediate and remote memory, attention, language skills and fund of knowledge are appropriate. There is no evidence of aphasia, agnosia, apraxia or anomia. Speech is clear with normal prosody and enunciation. Thought process is linear. Mood is normal and  affect is normal.  Cranial nerves II - XII are as described above under HEENT exam. In addition: shoulder shrug is normal with equal shoulder height noted. Motor exam: Normal bulk, strength and tone is noted. There is no drift, tremor or rebound. Romberg is negative. Reflexes are 2+ throughout. Babinski: Toes are flexor bilaterally. Fine motor skills and coordination: intact with normal finger taps, normal hand movements, normal rapid alternating patting, normal foot taps and normal foot agility.  Cerebellar testing: No dysmetria or intention tremor on finger to nose testing. Heel to shin is unremarkable bilaterally. There is no truncal or gait ataxia.  Sensory exam: intact to light touch, pinprick, vibration, temperature sense in the upper and lower extremities.  Gait, station and balance: He stands easily. No veering to one side is noted. No leaning to one side is noted. Posture is age-appropriate and stance is narrow based. Gait shows mild gait insecurity. He has difficulty with tandem walk, balance is generally speaking preserved.  Assessment and Plan:  In summary, Brian Wolf is a very pleasant 81 y.o.-year old male with an underlying medical history of hypertension, overweight state, and stroke secondary to right parietal intracerebral hemorrhage with residual left homonymous hemianopsia, who was diagnosed with obstructive sleep apnea over 3 decades ago. He was given a CPAP machine but it was a large and loud machine, very cumbersome for him to use and he stopped using CPAP many years ago. Given his stroke history and his prior history he would benefit from reevaluation for obstructive sleep apnea and from treatment with CPAP. He has cataract surgery planned for 06/18/2014. He would like to either do a sleep study before or with some delay after sense he will need eyedrops on a regular basis right after surgery. I agree with this. We will try to see when we can schedule him. I will see him back  afterwards. I had a long chat with the patient and his wife about my findings and the diagnosis of OSA, its prognosis and treatment options. We talked about medical treatments, surgical interventions and non-pharmacological approaches. I explained in particular the risks and ramifications of untreated moderate to severe OSA, especially with respect to developing cardiovascular disease down the Road, including congestive heart failure, difficult to treat hypertension, cardiac arrhythmias, or stroke. Even type 2 diabetes has, in part, been linked to untreated OSA. Symptoms of untreated OSA include daytime sleepiness, memory problems, mood irritability and mood disorder such as depression and anxiety, lack of energy, as well as recurrent headaches, especially morning headaches. We talked about trying to maintain  a healthy lifestyle in general, as well as the importance of weight control. I encouraged the patient to eat healthy, exercise daily and keep well hydrated, to keep a scheduled bedtime and wake time routine, to not skip any meals and eat healthy snacks in between meals. I advised the patient not to drive when feeling sleepy, but due to his visual impairment he is currently not driving. I recommended the following at this time: sleep study with potential positive airway pressure titration. (We will score hypopneas at 4% and split the sleep study into diagnostic and treatment portion, if the estimated. 2 hour AHI is >20/h).   I explained the sleep test procedure to the patient and also outlined possible surgical and non-surgical treatment options of OSA, including the use of a custom-made dental device (which would require a referral to a specialist dentist or oral surgeon), upper airway surgical options, such as pillar implants, radiofrequency surgery, tongue base surgery, and UPPP (which would involve a referral to an ENT surgeon). Rarely, jaw surgery such as mandibular advancement may be considered.  I  also explained the CPAP treatment option to the patient, who indicated that he would be willing to try CPAP if the need arises. I explained the importance of being compliant with PAP treatment, not only for insurance purposes but primarily to improve His symptoms, and for the patient's long term health benefit, including to reduce His cardiovascular risks. I answered all their questions today and the patient and his wife were in agreement. I would like to see him back after the sleep study is completed and encouraged him to call with any interim questions, concerns, problems or updates.   Thank you very much for allowing me to participate in the care of this nice patient. If I can be of any further assistance to you please do not hesitate to talk to me.   Sincerely,   Star Age, MD, PhD  I spent 25 minutes in total face-to-face time with the patient, more than 50% of which was spent in counseling and coordination of care, reviewing test results, reviewing medication and discussing or reviewing the diagnosis of OSA, hemorrhagic stroke, the prognosis and treatment options.

## 2014-06-04 NOTE — Patient Instructions (Signed)
Based on your symptoms and your exam I believe you are at risk for obstructive sleep apnea or OSA, and I think we should proceed with a sleep study to determine whether you do or do not have OSA and how severe it is. If you have more than mild OSA, I want you to consider treatment with CPAP. Please remember, the risks and ramifications of moderate to severe obstructive sleep apnea or OSA are: Cardiovascular disease, including congestive heart failure, stroke, difficult to control hypertension, arrhythmias, and even type 2 diabetes has been linked to untreated OSA. Sleep apnea causes disruption of sleep and sleep deprivation in most cases, which, in turn, can cause recurrent headaches, problems with memory, mood, concentration, focus, and vigilance. Most people with untreated sleep apnea report excessive daytime sleepiness, which can affect their ability to drive. Please do not drive if you feel sleepy.  I will see you back after your sleep study to go over the test results and where to go from there. We will call you after your sleep study and to set up an appointment at the time.   Please call Alycia to schedule your sleep study.

## 2014-07-24 ENCOUNTER — Ambulatory Visit (INDEPENDENT_AMBULATORY_CARE_PROVIDER_SITE_OTHER): Payer: Medicare Other | Admitting: Neurology

## 2014-07-24 VITALS — BP 160/77 | HR 57

## 2014-07-24 DIAGNOSIS — G479 Sleep disorder, unspecified: Secondary | ICD-10-CM

## 2014-07-24 DIAGNOSIS — G4710 Hypersomnia, unspecified: Secondary | ICD-10-CM

## 2014-07-24 DIAGNOSIS — G4733 Obstructive sleep apnea (adult) (pediatric): Secondary | ICD-10-CM | POA: Diagnosis not present

## 2014-07-24 DIAGNOSIS — G4761 Periodic limb movement disorder: Secondary | ICD-10-CM

## 2014-07-24 DIAGNOSIS — G4730 Sleep apnea, unspecified: Secondary | ICD-10-CM

## 2014-07-25 NOTE — Sleep Study (Signed)
Please see the scanned sleep study interpretation located in the Procedure tab within the Chart Review section. 

## 2014-07-29 ENCOUNTER — Other Ambulatory Visit: Payer: Self-pay

## 2014-07-30 ENCOUNTER — Telehealth: Payer: Self-pay | Admitting: Neurology

## 2014-07-30 DIAGNOSIS — G4733 Obstructive sleep apnea (adult) (pediatric): Secondary | ICD-10-CM

## 2014-07-30 NOTE — Telephone Encounter (Signed)
Dr. Clydene Fake patient, seen by me on 06/04/14. Split night study on 07/24/14. Ins: [employer]. Beverlee Nims:   Please call and notify patient that the recent sleep study confirmed the diagnosis of moderate OSA. He did well with CPAP during the study with significant improvement of the respiratory events. Therefore, I would like start the patient on CPAP therapy at home by prescribing a machine for home use. I placed the order in the chart. The patient will need a follow up appointment with me in 8 to 10 weeks post set up that has to be scheduled; please go ahead and schedule while you have the patient on the phone and make sure patient understands the importance of keeping this window for the FU appointment, as it is often an insurance requirement and failing to adhere to this may result in losing coverage for sleep apnea treatment. 15 min follow-up should suffice, unless there is a 30 min FU slot available.  Please re-enforce the importance of compliance with treatment and the need for [name] to monitor compliance data - again an insurance requirement and good feedback for the patient as far as how they are doing.  Also remind patient, that any upcoming CPAP machine or mask issues, should be first addressed with the DME company. Please ask if patient has a preference regarding DME company.  Please arrange for CPAP set up at home through a DME company of patient's choice - once you have spoken to the patient - and faxed/routed report to PCP and referring MD (if other than PCP), you can close this encounter, thanks,   Star Age, MD, PhD [facility] ([place])

## 2014-07-31 ENCOUNTER — Telehealth: Payer: Self-pay

## 2014-07-31 NOTE — Telephone Encounter (Signed)
Expand All Collapse All   Dr. Clydene Fake patient, seen by me on 06/04/14. Split night study on 07/24/14. Ins: [employer] Medicare. Beverlee Nims:   Please call and notify patient that the recent sleep study confirmed the diagnosis of moderate OSA. He did well with CPAP during the study with significant improvement of the respiratory events. Therefore, I would like start the patient on CPAP therapy at home by prescribing a machine for home use. I placed the order in the chart. The patient will need a follow up appointment with me in 8 to 10 weeks post set up that has to be scheduled; please go ahead and schedule while you have the patient on the phone and make sure patient understands the importance of keeping this window for the FU appointment, as it is often an insurance requirement and failing to adhere to this may result in losing coverage for sleep apnea treatment. 15 min follow-up should suffice, unless there is a 30 min FU slot available.  Please re-enforce the importance of compliance with treatment and the need for [name] to monitor compliance data - again an insurance requirement and good feedback for the patient as far as how they are doing.  Also remind patient, that any upcoming CPAP machine or mask issues, should be first addressed with the DME company. Please ask if patient has a preference regarding DME company.  Please arrange for CPAP set up at home through a DME company of patient's choice - once you have spoken to the patient - and faxed/routed report to PCP and referring MD (if other than PCP), you can close this encounter, thanks,   Star Age, MD, PhD [facility] ([place])

## 2014-07-31 NOTE — Telephone Encounter (Signed)
I spoke to patient's wife. She is aware of results and states that the patient would like to start treatment at home. I will fax orders to [facility]. I will fax report to PCP, and send to Dr. Leonie Man. I will also send patient a letter stating who we referred patient to and the importance of compliance.

## 2014-11-08 ENCOUNTER — Ambulatory Visit: Payer: Medicare Other | Admitting: Adult Health

## 2014-11-11 ENCOUNTER — Ambulatory Visit (INDEPENDENT_AMBULATORY_CARE_PROVIDER_SITE_OTHER): Payer: Medicare Other | Admitting: Adult Health

## 2014-11-11 ENCOUNTER — Encounter: Payer: Self-pay | Admitting: Adult Health

## 2014-11-11 VITALS — BP 173/69 | HR 48 | Ht 65.0 in | Wt 172.0 lb

## 2014-11-11 DIAGNOSIS — G4733 Obstructive sleep apnea (adult) (pediatric): Secondary | ICD-10-CM | POA: Diagnosis not present

## 2014-11-11 DIAGNOSIS — Z8673 Personal history of transient ischemic attack (TIA), and cerebral infarction without residual deficits: Secondary | ICD-10-CM

## 2014-11-11 DIAGNOSIS — Z9989 Dependence on other enabling machines and devices: Secondary | ICD-10-CM

## 2014-11-11 NOTE — Patient Instructions (Signed)
Continue aspirin 81 mg daily for secondary stroke prevention and maintain strict control of hypertension with blood pressure goal below 130/90, diabetes with hemoglobin A1c goal below 6.5% and lipids with LDL cholesterol goal below 100 mg/dL. Continue using CPAP nightly. If you have any stroke like symptoms please call 911.

## 2014-11-11 NOTE — Progress Notes (Addendum)
PATIENT: Brian Wolf DOB: [birth year]  REASON FOR VISIT: follow up- History of CVA, OSA on CPAP HISTORY FROM: patient  HISTORY OF PRESENT ILLNESS:  Brian Wolf is an 81 year old male with a history of intracerebral hemorrhage in NOV 2015. He returns today for follow-up. The patient is currently taking Aspirin 81 mg daily. The patient's blood pressure is elevated today. The patient is quite anxious and so that may be the reason of his increased pressure. Patient states that his primary care manages his cholesterol and frequently checks his hemoglobin A1c. The patient continues to have some visual loss in the left lower peripheral field. Since the last visit the patient did have a sleep study and was started on CPAP machine. His download today indicates that he used his machine 30 out of 30 days for compliance of 100%. He uses machine greater than 4 hours for 30 days for compliance of 100%. On average he uses his machine 8 hours and 30 minutes. His residual AHI is 2.7 on 6 cm of water with EPR of 1. the patient states that he feels that the CPAP has been beneficial. He states he wakes up and feels that his sleep has been restored. His Epworth sleepiness score is 8 and fatigue severity score is 32. He returns today for an evaluation.  HISTORY 05/09/14: Brian Wolf is an 81 year old male with a history of intracerebral hemorrhage in November 2015. He returns today for follow-up. The patient is currently taking aspirin 81 mg daily. Patient blood pressure is slightly elevated today. He states that his primary care has been managing his blood pressure. He states that the home health nurse that came to visit recommended that his metoprolol dose be decreased due to his heart rate. His heart rate is slightly lower today. Patient's primary care also manages his cholesterol he is currently on Zocor. The patient's left-sided weakness has resolved. He states that he still has some slight visual loss in the left lower  peripheral field. The patient continues to not operate a motor vehicle. He has an appointment with his ophthalmologist next week. He states he may have surgery on the right eye due to cataract. The patient can complete all ADLs independently. Denies any changes with his gait or balance. Denies any falls. Patient also states that he has a history of obstructive sleep apnea he was on the CPAP machine but has not used that for several years. He is wondering if he needs to have another sleep study. He denies any strokelike symptoms. Any new neurological symptoms. Denies any new medical issues. He returns today for follow-up.   REVIEW OF SYSTEMS: Out of a complete 14 system review of symptoms, the patient complains only of the following symptoms, and all other reviewed systems are negative.  A sensitivity, cold intolerance, apnea  ALLERGIES: No Known Allergies  HOME MEDICATIONS: Outpatient Prescriptions Prior to Visit  Medication Sig Dispense Refill  . aspirin 81 MG tablet Take 81 mg by mouth daily.    . hydrochlorothiazide (HYDRODIURIL) 25 MG tablet Take 1.5 tablets (37.5 mg total) by mouth daily. 45 tablet 1  . losartan (COZAAR) 100 MG tablet Take 100 mg by mouth daily.    . metoprolol (LOPRESSOR) 50 MG tablet Take 50 mg by mouth daily.     . Probiotic Product (PROBIOTIC DAILY PO) Take by mouth daily.    . ranitidine (ZANTAC) 150 MG tablet Take 150 mg by mouth 2 (two) times daily.    Marland Kitchen  simvastatin (ZOCOR) 40 MG tablet Take 40 mg by mouth daily.    . Travoprost, BAK Free, (TRAVATAN) 0.004 % SOLN ophthalmic solution Place 1 drop into both eyes at bedtime.     No facility-administered medications prior to visit.    PAST MEDICAL HISTORY: Past Medical History  Diagnosis Date  . Hypertension   . Sleep difficulties   . CVA (cerebral infarction) 12/2013    PAST SURGICAL HISTORY: Past Surgical History  Procedure Laterality Date  . Abdominal surgery    . Cataract extraction Left     FAMILY  HISTORY: Family History  Problem Relation Age of Onset  . Hyperlipidemia Mother   . Hypertension Mother   . Hypertension Father     SOCIAL HISTORY: Social History   Social History  . Marital Status: Married    Spouse Name: [name]  . Number of Children: 2  . Years of Education: college   Occupational History  .      retired   Social History Main Topics  . Smoking status: Never Smoker   . Smokeless tobacco: Never Used  . Alcohol Use: No  . Drug Use: No  . Sexual Activity: Not on file   Other Topics Concern  . Not on file   Social History Narrative   Patient lives at home with his wife [name].   Retired.   Education Retail buyer.   Right handed.   Caffeine two cups of coffee daily.      PHYSICAL EXAM  Filed Vitals:   11/11/14 1255  BP: 181/71  Pulse: 52  Height: 5\' 5"  (1.651 m)  Weight: 172 lb (78.019 kg)   Body mass index is 28.62 kg/(m^2).  Generalized: Well developed, in no acute distress  Neck: Circumference 17 inches, Mallampati 4+  Neurological examination  Mentation: Alert oriented to time, place, history taking. Follows all commands speech and language fluent Cranial nerve II-XII: Pupils were equal round reactive to light. Extraocular movements were full, visual field were full on confrontational test. Facial sensation and strength were normal. Uvula tongue midline. Head turning and shoulder shrug  were normal and symmetric. Motor: The motor testing reveals 5 over 5 strength of all 4 extremities. Good symmetric motor tone is noted throughout.  Sensory: Sensory testing is intact to soft touch on all 4 extremities. No evidence of extinction is noted.  Coordination: Cerebellar testing reveals good finger-nose-finger and heel-to-shin bilaterally.  Gait and station: Gait is normal. Tandem gait is unsteady. Romberg is negative. No drift is seen.  Reflexes: Deep tendon reflexes are symmetric and normal bilaterally.   DIAGNOSTIC DATA (LABS, IMAGING,  TESTING) - I reviewed patient records, labs, notes, testing and imaging myself where available.  Lab Results  Component Value Date   WBC 8.8 12/25/2013   HGB 12.4* 12/25/2013   HCT 37.6* 12/25/2013   MCV 92.8 12/25/2013   PLT 438* 12/25/2013      Component Value Date/Time   NA 134* 12/21/2013 1026   K 4.3 12/21/2013 1026   CL 94* 12/21/2013 1026   CO2 28 12/21/2013 1026   GLUCOSE 129* 12/21/2013 1026   BUN 19 12/21/2013 1026   CREATININE 0.96 12/21/2013 1026   CALCIUM 8.7 12/21/2013 1026   PROT 7.2 12/21/2013 1026   ALBUMIN 2.6* 12/21/2013 1026   AST 28 12/21/2013 1026   ALT 32 12/21/2013 1026   ALKPHOS 70 12/21/2013 1026   BILITOT 0.4 12/21/2013 1026   GFRNONAA 76* 12/21/2013 1026   GFRAA 88* 12/21/2013 1026  Lab Results  Component Value Date   CHOL 147 12/15/2013   HDL 54 12/15/2013   LDLCALC 76 12/15/2013   TRIG 84 12/15/2013   CHOLHDL 2.7 12/15/2013   Lab Results  Component Value Date   HGBA1C 6.4* 12/15/2013   Lab Results  Component Value Date   [id number] 12/15/2013   Lab Results  Component Value Date   TSH 1.820 12/15/2013      ASSESSMENT AND PLAN 81 y.o. year old male  has a past medical history of Hypertension; Sleep difficulties; and CVA (cerebral infarction) (12/2013). here with:  1. History of CVA 2. OSA    Continue aspirin 81 mg daily for secondary stroke prevention and maintain strict control of hypertension with blood pressure goal below 130/90, diabetes with hemoglobin A1c goal below 6.5% and lipids with LDL cholesterol goal below 100 mg/dL. patient's blood pressure is elevated today. He plans to go see his primary care provider after this visit. Patient encouraged to continue using his CPAP nightly. The patient currently does not have a follow-up appointment with Dr. Rexene Alberts. He will follow-up with her in 4-5 months. He will follow-up with Dr. Leonie Man in 7-8 months.    Ward Givens, MSN, NP-C 11/11/2014, 1:06 PM [facility] [address], [place], [place] [address] [phone]   I reviewed the above note and documentation by the Nurse Practitioner and agree with the history, physical exam, assessment and plan as outlined above. I was immediately available for face-to-face consultation. Star Age, MD, PhD [facility])

## 2014-11-12 ENCOUNTER — Telehealth: Payer: Self-pay | Admitting: Adult Health

## 2014-11-12 ENCOUNTER — Ambulatory Visit: Payer: Medicare Other | Admitting: Adult Health

## 2014-11-12 NOTE — Progress Notes (Signed)
I agree with the above plan 

## 2014-11-12 NOTE — Telephone Encounter (Signed)
Noted thanks °

## 2014-11-12 NOTE — Telephone Encounter (Signed)
Pt called in somewhat of an angered state requesting to know if Larey Seat was English as a second language teacher. I replied yes. "Well I need to speak with her". I replied she could be with patients (I forgot she is not here today). She was angered even more and raised her voice more "Have her call me". I asked what it was in reference to and the reply with a yell was "Yesterday's office visit"! Please call and advise at [phone].

## 2014-11-12 NOTE — Telephone Encounter (Signed)
I have spoken with Brian Wolf this morning.  He sts. that he was upset yesterday that automated BP was high--sts. he f/u with his pcp an bp there was ok, so he feels our automated bp machines are not correct, need to be calibrated.  I will pass this along to Megan/fim

## 2014-11-13 NOTE — Telephone Encounter (Signed)
Patient's wife states no one has returned a call even though I see Faith had spoken with the patient.The patient states he spoke with no one. Please call.

## 2014-11-13 NOTE — Telephone Encounter (Signed)
Spoke to spouse. She said patient had forgot that he spoke with Faith on 11/12/14. She explained they think our b/p machines are off b/c pt went to PCP shortly after leaving here and B/P was normal. Advised noted.

## 2015-03-18 ENCOUNTER — Ambulatory Visit: Payer: Medicare Other | Admitting: Neurology

## 2015-04-03 ENCOUNTER — Ambulatory Visit (INDEPENDENT_AMBULATORY_CARE_PROVIDER_SITE_OTHER): Payer: Medicare Other | Admitting: Neurology

## 2015-04-03 ENCOUNTER — Encounter: Payer: Self-pay | Admitting: Neurology

## 2015-04-03 VITALS — BP 136/72 | HR 66 | Resp 16 | Ht 65.0 in | Wt 170.0 lb

## 2015-04-03 DIAGNOSIS — G4733 Obstructive sleep apnea (adult) (pediatric): Secondary | ICD-10-CM | POA: Diagnosis not present

## 2015-04-03 DIAGNOSIS — Z9989 Dependence on other enabling machines and devices: Principal | ICD-10-CM

## 2015-04-03 NOTE — Progress Notes (Signed)
Subjective:    Patient ID: Brian Wolf is a 82 y.o. male.  HPI     Interim history:   Brian Wolf is a very friendly 82 year old right-handed gentleman with an underlying medical history of hypertension, overweight state, and stroke secondary to right parietal intracerebral hemorrhage with residual left homonymous hemianopsia, who presents for follow-up consultation of his obstructive sleep apnea, after his sleep study testing. The patient is accompanied by his wife today. I first met him on 06/04/2014 at the request of Brian Wolf and Dr. Leonie Wolf, at which time the patient reported a prior diagnosis of obstructive sleep apnea but he had not used CPAP in many years. I invited him back for sleep study. He had a split-night sleep study on 07/24/2014. I went over his test results with him in detail today. Sleep efficiency at baseline was 66.4% with a latency to sleep of 24.5 minutes and wake after sleep onset of 30.5 minutes with moderate sleep fragmentation noted. Baseline arousal index was elevated. He had an increased percentage of stage II sleep, slow-wave sleep was 7.8% and REM sleep was 3.7%. REM latency was normal. HeReporno had moderate PLMS with minimal arousals. He had no significant EKG or EEG changes. Mild to moderate snoring was noted. Total AHI was 27.1 per hour, average oxygen saturation was 94%, nadir was 81%. She was then titrated on CPAP. Sleep efficiency was 74.6% with a latency to sleep of 49.5 minutes and wake after sleep onset was 15 minutes with mild sleep fragmentation noted. He had a normal arousal index. He had an increased percentage of stage II sleep, slow-wave sleep at 16.5% in rem sleep was 14.5%. Average oxygen saturation was 94%, nadir was 89%. He had severe PLMS during the treatment portion of the study but minimal arousals. CPAP was titrated from a pressure of 5 cm to 6 cm. AHI was 1 per hour on the final pressure with supine REM sleep achieved. Based on his test results  and in particular in light of his medical history of prescribed CPAP therapy for home use.   Today, 04/03/2015: I reviewed his CPAP compliance data from 03/03/2015 through 04/01/2015 which is a total of 30 days during which time he used his machine every night with percent used days greater than 4 hours at 100%, indicating superb compliance with an average usage of 8 hours and 41 minutes, residual AHI at 2.5 per hour, leak acceptable for the 95th percentile at 15.5 L/m on a pressure of 6 cm.   Today, 04/03/2015: He reports doing well, feels better with CPAP, wife agrees that he has done well with it. He indicates some improvement, and is compliant. He is planning a trip to [place]. He will take his CPAP machine with him.   Previously:   06/04/2014: He reports a prior diagnosis of obstructive sleep apnea. He was given a CPAP machine but has not used it in years. In fact, he does not currently have a CPAP machine. His sleep study may have been 35 years ago or so. He came to the [place] at the age of 37 from [place]. 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 He also has cataracts. He had cataract extraction to the left eye in October but then suffered a stroke in November 2015 and his right eye cataract surgery was postponed from December 2 later this month. He has an appointment on 06/18/2014 for cataract surgery but would  like to see if he can have a sleep study first. He would be willing to try CPAP again. He does snore. He goes to bed late, around 1 AM typically. He sleeps till 9:30 or 10 or so. He drinks 2 cups of coffee in the morning. He does not smoke. He does not drink alcohol. He likes to cook. He is currently not driving. He is walking without a cane or any walking aid. His metoprolol was reduced recently because of low pulse rate. His blood pressure has crept up a little bit. He takes every day. He has not had any new neurological symptoms.  He typically does not have a headache but at the time of his stroke diagnosis he had a right parietal headache, especially when coughing. I reviewed hospital records from his admission in November 2015. He was admitted on 12/14/2013 through 12/20/2013 and discharged to rehabilitation for he stayed from 12/20/2013 through 12/26/2013. He had an MRI brain without contrast on 12/17/2013: Large RIGHT temporal occipital lobar hemorrhage with surrounding edema. Approximate volume 21 mL. Probable trace subarachnoid and intraventricular blood. Probable lobar hypertensive bleed. Cerebral amyloid angiopathy not excluded. No obvious underlying mass or vascular malformation.   Subcentimeter focus of acute infarction RIGHT lateral temporal lobe, remote from the hemorrhage.   No intracranial flow limiting stenosis or vascular malformation.   He has a family history of stroke. He's not sure if there is a family history of obstructive sleep apnea. He has 2 grown children from his first marriage. He has been married for 29 years to his current wife.  His Epworth sleepiness score is 22 out of 24 today and fatigue score is 55, both elevated.  His Past Medical History Is Significant For: Past Medical History  Diagnosis Date  . Hypertension   . Sleep difficulties   . CVA (cerebral infarction) 12/2013    His Past Surgical History Is Significant For: Past Surgical History  Procedure Laterality Date  . Abdominal surgery    . Cataract extraction Left     His Family History Is Significant For: Family History  Problem Relation Age of Onset  . Hyperlipidemia Mother   . Hypertension Mother   . Hypertension Father     His Social History Is Significant For: Social History   Social History  . Marital Status: Married    Spouse Name: Brian Wolf  . Number of Children: 2  . Years of Education: college   Occupational History  .      retired   Social History Main Topics  . Smoking status: Never Smoker    . Smokeless tobacco: Never Used  . Alcohol Use: No  . Drug Use: No  . Sexual Activity: Not Asked   Other Topics Concern  . None   Social History Narrative   Patient lives at home with his wife Brian Wolf.   Retired.   Education Retail buyer.   Right handed.   Caffeine two cups of coffee daily.    His Allergies Are:  No Known Allergies:   His Current Medications Are:  Outpatient Encounter Prescriptions as of 04/03/2015  Medication Sig  . aspirin 81 MG tablet Take 81 mg by mouth daily.  . hydrochlorothiazide (HYDRODIURIL) 25 MG tablet Take 1.5 tablets (37.5 mg total) by mouth daily.  Marland Kitchen losartan (COZAAR) 100 MG tablet Take 100 mg by mouth daily.  . metoprolol (LOPRESSOR) 50 MG tablet Take 50 mg by mouth daily.   . Probiotic Product (PROBIOTIC DAILY PO) Take by mouth daily.  Marland Kitchen  ranitidine (ZANTAC) 150 MG tablet Take 150 mg by mouth 2 (two) times daily.  . simvastatin (ZOCOR) 40 MG tablet Take 40 mg by mouth daily.  . Travoprost, BAK Free, (TRAVATAN) 0.004 % SOLN ophthalmic solution Place 1 drop into both eyes at bedtime.   No facility-administered encounter medications on file as of 04/03/2015.  :  Review of Systems:  Out of a complete 14 point review of systems, all are reviewed and negative with the exception of these symptoms as listed below:   Review of Systems  Neurological:       Patient states that he "loves" his CPAP. Wife feels like he is sleeping better. No new concerns.     Objective:  Neurologic Exam  Physical Exam Physical Examination:   Filed Vitals:   04/03/15 1257  BP: 136/72  Pulse: 66  Resp: 16   General Examination: The patient is a very pleasant 82 y.o. male in no acute distress. He appears well-developed and well-nourished and well groomed.   HEENT: Normocephalic, atraumatic, pupils are equal, round and reactive to light and accommodation. He is status post cataract surgery in the left and has a fairly dense cataract on the right. Extraocular  tracking is good without limitation to gaze excursion or nystagmus noted. Normal smooth pursuit is noted. On visual field testing, he has some decrease in the left visual field on finger perimetry. Hearing is grossly intact. Face is symmetric with normal facial animation and normal facial sensation. Speech is clear with no dysarthria noted. There is no hypophonia. There is no lip, neck/head, jaw or voice tremor. Neck is supple with full range of passive and active motion. There are no carotid bruits on auscultation. Oropharynx exam reveals: mild mouth dryness, adequate dental hygiene and moderate airway crowding, due to redundant soft palate. Mallampati is class III. Tongue protrudes centrally and palate elevates symmetrically. Tonsils are absent. He has a Mild overbite. Nasal inspection reveals no significant nasal mucosal bogginess or redness and no septal deviation. No sores around nostrils.   Chest: Clear to auscultation without wheezing, rhonchi or crackles noted.  Heart: S1+S2+0, regular and normal without murmurs, rubs or gallops noted.   Abdomen: Soft, non-tender and non-distended with normal bowel sounds appreciated on auscultation.  Extremities: There is no pitting edema in the distal lower extremities bilaterally.   Skin: Warm and dry without trophic changes noted. There are no varicose veins.  Musculoskeletal: exam reveals no obvious joint deformities, tenderness or joint swelling or erythema.   Neurologically:  Mental status: The patient is awake, alert and oriented in all 4 spheres. His immediate and remote memory, attention, language skills and fund of knowledge are appropriate. There is no evidence of aphasia, agnosia, apraxia or anomia. Speech is clear with normal prosody and enunciation. Thought process is linear. Mood is normal and affect is normal.  Cranial nerves II - XII are as described above under HEENT exam. In addition: shoulder shrug is normal with equal shoulder height  noted. Motor exam: Normal bulk, strength and tone is noted. There is no drift, tremor or rebound. Romberg is negative. Reflexes are 2+ throughout. Fine motor skills and coordination: intact with normal finger taps, normal hand movements, normal rapid alternating patting, normal foot taps and normal foot agility.  Cerebellar testing: No dysmetria or intention tremor on finger to nose testing. Heel to shin is unremarkable bilaterally. There is no truncal or gait ataxia.  Sensory exam: intact to light touch in the UEs and LEs.  Gait, station  and balance: He stands easily. No veering to one side is noted. No leaning to one side is noted. Posture is age-appropriate and stance is narrow based. Gait shows mild gait insecurity, not able to do tandem walk.  Assessment and Plan:  In summary, Azaan Alyn Jurney is a very pleasant 82 year old male with an underlying medical history of hypertension, overweight state, and stroke secondary to right parietal intracerebral hemorrhage with residual left homonymous hemianopsia, who presents for follow up consultation of his obstructive sleep apnea. He was previously diagnosed with OSA but stopped using his machine. He had a split-night sleep study in June 2016. He did well with CPAP of 6 cm. In fact, he is fully compliant with treatment. We diagnosed him with moderate obstructive sleep apnea. He is congratulated on his superb treatment adherence. His wife believes he has been sleeping better. He does note some improvement of his sleep. He is trying to stay active. He denies any new symptoms. He is planning a trip to [place] and is planning to take his machine with him. His physical exam is stable. We talked about his sleep study results in detail and we also reviewed his compliance data together. I reminded the patient and his wife regarding moderate and severe obstructive sleep apnea and the risks and ramifications of untreated OSA, in particular with respect to  cardiovascular disease risk. I explained the importance of being compliant with PAP treatment, not only for insurance purposes but primarily to improve His symptoms, and for the patient's long term health benefit, including to reduce His cardiovascular risks. See him back in 6 months, sooner if the need arises. I answered all their questions today and the patient and his wife were in agreement. He is reminded of his appointment with Dr. Leonie Wolf in May 2017 as well. I spent 25 minutes in total face-to-face time with the patient, more than 50% of which was spent in counseling and coordination of care, reviewing test results, reviewing medication and discussing or reviewing the diagnosis of OSA, its prognosis and treatment options.

## 2015-04-03 NOTE — Patient Instructions (Addendum)

## 2015-04-08 ENCOUNTER — Ambulatory Visit: Payer: Medicare Other | Admitting: Neurology

## 2015-06-11 ENCOUNTER — Encounter: Payer: Self-pay | Admitting: Neurology

## 2015-06-11 ENCOUNTER — Ambulatory Visit (INDEPENDENT_AMBULATORY_CARE_PROVIDER_SITE_OTHER): Payer: Medicare Other | Admitting: Neurology

## 2015-06-11 VITALS — BP 155/65 | HR 48 | Ht 65.0 in | Wt 171.4 lb

## 2015-06-11 DIAGNOSIS — I6990 Unspecified sequelae of unspecified cerebrovascular disease: Secondary | ICD-10-CM | POA: Diagnosis not present

## 2015-06-11 NOTE — Progress Notes (Signed)
PATIENT: Brian Wolf DOB: [birth year]  REASON FOR VISIT: follow up- History of CVA, OSA on CPAP HISTORY FROM: patient  HISTORY OF PRESENT ILLNESS:  Brian Wolf is an 82 year old male with a history of intracerebral hemorrhage in NOV 2015. He returns today for follow-up. The patient is currently taking Aspirin 81 mg daily. The patient's blood pressure is elevated today. The patient is quite anxious and so that may be the reason of his increased pressure. Patient states that his primary care manages his cholesterol and frequently checks his hemoglobin A1c. The patient continues to have some visual loss in the left lower peripheral field. Since the last visit the patient did have a sleep study and was started on CPAP machine. His download today indicates that he used his machine 30 out of 30 days for compliance of 100%. He uses machine greater than 4 hours for 30 days for compliance of 100%. On average he uses his machine 8 hours and 30 minutes. His residual AHI is 2.7 on 6 cm of water with EPR of 1. the patient states that he feels that the CPAP has been beneficial. He states he wakes up and feels that his sleep has been restored. His Epworth sleepiness score is 8 and fatigue severity score is 32. He returns today for an evaluation.  HISTORY 05/09/14: Brian Wolf is an 82 year old male with a history of intracerebral hemorrhage in November 2015. He returns today for follow-up. The patient is currently taking aspirin 81 mg daily. Patient blood pressure is slightly elevated today. He states that his primary care has been managing his blood pressure. He states that the home health nurse that came to visit recommended that his metoprolol dose be decreased due to his heart rate. His heart rate is slightly lower today. Patient's primary care also manages his cholesterol he is currently on Zocor. The patient's left-sided weakness has resolved. He states that he still has some slight visual loss in the left lower  peripheral field. The patient continues to not operate a motor vehicle. He has an appointment with his ophthalmologist next week. He states he may have surgery on the right eye due to cataract. The patient can complete all ADLs independently. Denies any changes with his gait or balance. Denies any falls. Patient also states that he has a history of obstructive sleep apnea he was on the CPAP machine but has not used that for several years. He is wondering if he needs to have another sleep study. He denies any strokelike symptoms. Any new neurological symptoms. Denies any new medical issues. He returns today for follow-up. Update 06/11/2015 ; he returns for follow-up after last visit in October 2016. The common bile wife. Convinced to do well without recurrent stroke or TIA symptoms. He states his blood pressure is quite well controlled at home but today it is elevated at 155/65 office. He remains on aspirin which is tolerating well without bleeding or bruising. He is on Zocor without side effects and last lipid profile was satisfactory. He has no new complaints today. He recently went on trip to [place] and it went well.  REVIEW OF SYSTEMS: Out of a complete 14 system review of symptoms, the patient complains only of the following symptoms, and all other reviewed systems are negative.  chills, apnea  ALLERGIES: No Known Allergies  HOME MEDICATIONS: Outpatient Prescriptions Prior to Visit  Medication Sig Dispense Refill  . aspirin 81 MG tablet Take 81 mg by mouth daily.    Marland Kitchen  hydrochlorothiazide (HYDRODIURIL) 25 MG tablet Take 1.5 tablets (37.5 mg total) by mouth daily. 45 tablet 1  . losartan (COZAAR) 100 MG tablet Take 100 mg by mouth daily.    . metoprolol (LOPRESSOR) 50 MG tablet Take 50 mg by mouth daily.     . Probiotic Product (PROBIOTIC DAILY PO) Take by mouth daily.    . ranitidine (ZANTAC) 150 MG tablet Take 150 mg by mouth 2 (two) times daily.    . simvastatin (ZOCOR) 40 MG tablet Take 40  mg by mouth daily.    . Travoprost, BAK Free, (TRAVATAN) 0.004 % SOLN ophthalmic solution Place 1 drop into both eyes at bedtime.     No facility-administered medications prior to visit.    PAST MEDICAL HISTORY: Past Medical History  Diagnosis Date  . Hypertension   . Sleep difficulties   . CVA (cerebral infarction) 12/2013  . Stroke [facility])     PAST SURGICAL HISTORY: Past Surgical History  Procedure Laterality Date  . Abdominal surgery    . Cataract extraction Left     FAMILY HISTORY: Family History  Problem Relation Age of Onset  . Hyperlipidemia Mother   . Hypertension Mother   . Hypertension Father     SOCIAL HISTORY: Social History   Social History  . Marital Status: Married    Spouse Name: [name]  . Number of Children: 2  . Years of Education: college   Occupational History  .      retired   Social History Main Topics  . Smoking status: Never Smoker   . Smokeless tobacco: Never Used  . Alcohol Use: No  . Drug Use: No  . Sexual Activity: Not on file   Other Topics Concern  . Not on file   Social History Narrative   Patient lives at home with his wife Brian Wolf.   Retired.   Education Retail buyer.   Right handed.   Caffeine two cups of coffee daily.      PHYSICAL EXAM  Filed Vitals:   06/11/15 1014  BP: 155/65  Pulse: 48  Height: 5\' 5"  (1.651 m)  Weight: 171 lb 6.4 oz (77.747 kg)   Body mass index is 28.52 kg/(m^2).  Generalized: Well developed, in no acute distress  Neck: Circumference 17 inches, Mallampati 4+  Neurological examination  Mentation: Alert oriented to time, place, history taking. Follows all commands speech and language fluent Cranial nerve II-XII: Pupils were equal round reactive to light. Extraocular movements were full, visual field were full on confrontational test. Facial sensation and strength were normal. Uvula tongue midline. Head turning and shoulder shrug  were normal and symmetric. Motor: The motor testing  reveals 5 over 5 strength of all 4 extremities. Good symmetric motor tone is noted throughout.  Sensory: Sensory testing is intact to soft touch on all 4 extremities. No evidence of extinction is noted.  Coordination: Cerebellar testing reveals good finger-nose-finger and heel-to-shin bilaterally.  Gait and station: Gait is normal. Tandem gait is unsteady. Romberg is negative. No drift is seen.  Reflexes: Deep tendon reflexes are symmetric and normal bilaterally.   DIAGNOSTIC DATA (LABS, IMAGING, TESTING) - I reviewed patient records, labs, notes, testing and imaging myself where available.  Lab Results  Component Value Date   WBC 8.8 12/25/2013   HGB 12.4* 12/25/2013   HCT 37.6* 12/25/2013   MCV 92.8 12/25/2013   PLT 438* 12/25/2013      Component Value Date/Time   NA 134* 12/21/2013 1026   K 4.3  12/21/2013 1026   CL 94* 12/21/2013 1026   CO2 28 12/21/2013 1026   GLUCOSE 129* 12/21/2013 1026   BUN 19 12/21/2013 1026   CREATININE 0.96 12/21/2013 1026   CALCIUM 8.7 12/21/2013 1026   PROT 7.2 12/21/2013 1026   ALBUMIN 2.6* 12/21/2013 1026   AST 28 12/21/2013 1026   ALT 32 12/21/2013 1026   ALKPHOS 70 12/21/2013 1026   BILITOT 0.4 12/21/2013 1026   GFRNONAA 76* 12/21/2013 1026   GFRAA 88* 12/21/2013 1026   Lab Results  Component Value Date   CHOL 147 12/15/2013   HDL 54 12/15/2013   LDLCALC 76 12/15/2013   TRIG 84 12/15/2013   CHOLHDL 2.7 12/15/2013   Lab Results  Component Value Date   HGBA1C 6.4* 12/15/2013   Lab Results  Component Value Date   [id number] 12/15/2013   Lab Results  Component Value Date   TSH 1.820 12/15/2013      ASSESSMENT AND PLAN 82 y.o. year old male  has a past medical history of Hypertension; Sleep difficulties; CVA (cerebral infarction) (12/2013); and Stroke ([place]). here with:  1. History of CVA 2. OSA    I had a long d/w patient and his wifeabout his remote brain hemorrhage, risk for recurrent stroke/TIAs, personally  independently reviewed imaging studies and stroke evaluation results and answered questions.Continue aspirin 81 mg daily  for secondary stroke prevention and maintain strict control of hypertension with blood pressure goal below 130/90,   and lipids with LDL cholesterol goal below 70 mg/dL.  Continue follow-up with Dr. Rexene Alberts for sleep apnea Followup in the future with Ward Givens, nurse practitioner in one year or call earlier if necessary Antony Contras, MD  06/11/2015, 10:40 AM [facility] [address] [place], [place] [address] [phone]   I reviewed the above note and documentation by the Nurse Practitioner and agree with the history, physical exam, assessment and plan as outlined above. I was immediately available for face-to-face consultation. Star Age, MD, PhD [facility])

## 2015-06-11 NOTE — Patient Instructions (Signed)
I had a long d/w patient and his wifeabout his remote brain hemorrhage, risk for recurrent stroke/TIAs, personally independently reviewed imaging studies and stroke evaluation results and answered questions.Continue aspirin 81 mg daily  for secondary stroke prevention and maintain strict control of hypertension with blood pressure goal below 130/90,   and lipids with LDL cholesterol goal below 70 mg/dL.  Followup in the future with Ward Givens, nurse practitioner in one year or call earlier if necessary

## 2015-10-14 ENCOUNTER — Encounter: Payer: Self-pay | Admitting: Neurology

## 2015-10-14 ENCOUNTER — Ambulatory Visit (INDEPENDENT_AMBULATORY_CARE_PROVIDER_SITE_OTHER): Payer: Medicare Other | Admitting: Neurology

## 2015-10-14 VITALS — BP 152/80 | HR 60 | Resp 16 | Ht 65.0 in | Wt 166.0 lb

## 2015-10-14 DIAGNOSIS — Z9989 Dependence on other enabling machines and devices: Principal | ICD-10-CM

## 2015-10-14 DIAGNOSIS — Z8673 Personal history of transient ischemic attack (TIA), and cerebral infarction without residual deficits: Secondary | ICD-10-CM

## 2015-10-14 DIAGNOSIS — G4733 Obstructive sleep apnea (adult) (pediatric): Secondary | ICD-10-CM | POA: Diagnosis not present

## 2015-10-14 NOTE — Patient Instructions (Signed)
Try to exercise a little more.  If you have productive cough or fever or shortness of breath, speak with Dr. Marisue Humble.   Keep up the good work! I will see you back in 12 months for sleep apnea check up.

## 2015-10-14 NOTE — Progress Notes (Signed)
Subjective:    Patient ID: Brian Wolf is a 82 y.o. male.  HPI     Interim history:   Brian Wolf is a very friendly 82 year old right-handed gentleman with an underlying medical history of hypertension, overweight state, and stroke secondary to right parietal intracerebral hemorrhage with residual left homonymous hemianopsia, who presents for follow-up consultation of his obstructive sleep apnea, on CPAP therapy. The patient is accompanied by his wife today. I last saw him on 04/03/2015, at which time we talked about his sleep study results. We also went over his compliance data. He was fully compliant with treatment. He reported doing better with CPAP, feeling better rested. I suggested a six-month follow-up.   Today, 10/14/2015: I reviewed his CPAP compliance data from 09/13/2015 through 10/12/2015, which is a total of 30 days, during which time he used his machine every night with percent used days greater than 4 hours at 100%, indicating superb compliance with an average usage of 9 hours and 47 minutes, residual AHI at 1.4 per hour, leak low for the 95th percentile at 14.1 L/m on a pressure of 6 cm.  Today, 10/14/2015: He reports some recent stressors, wife reports that he is not very motivated to get out of bed sometimes, he spends quite a bit of time in bed, overall, he does do his exercises that he was told by physical therapy and occupational therapy to continue at home. He denies any significant depression but is at times easily stressed out. No other recent illness, denies any shortness of breath or chest pain or fevers or chills.  Previously:   I first met him on 06/04/2014 at the request of Brian Wolf and Dr. Leonie Wolf, at which time the patient reported a prior diagnosis of obstructive sleep apnea but he had not used CPAP in many years. I invited him back for sleep study. He had a split-night sleep study on 07/24/2014. I went over his test results with him in detail today. Sleep  efficiency at baseline was 66.4% with a latency to sleep of 24.5 minutes and wake after sleep onset of 30.5 minutes with moderate sleep fragmentation noted. Baseline arousal index was elevated. He had an increased percentage of stage II sleep, slow-wave sleep was 7.8% and REM sleep was 3.7%. REM latency was normal. HeReporno had moderate PLMS with minimal arousals. He had no significant EKG or EEG changes. Mild to moderate snoring was noted. Total AHI was 27.1 per hour, average oxygen saturation was 94%, nadir was 81%. She was then titrated on CPAP. Sleep efficiency was 74.6% with a latency to sleep of 49.5 minutes and wake after sleep onset was 15 minutes with mild sleep fragmentation noted. He had a normal arousal index. He had an increased percentage of stage II sleep, slow-wave sleep at 16.5% in rem sleep was 14.5%. Average oxygen saturation was 94%, nadir was 89%. He had severe PLMS during the treatment portion of the study but minimal arousals. CPAP was titrated from a pressure of 5 cm to 6 cm. AHI was 1 per hour on the final pressure with supine REM sleep achieved. Based on his test results and in particular in light of his medical history of prescribed CPAP therapy for home use.    I reviewed his CPAP compliance data from 03/03/2015 through 04/01/2015 which is a total of 30 days during which time he used his machine every night with percent used days greater than 4 hours at 100%, indicating superb compliance with an average usage of  8 hours and 41 minutes, residual AHI at 2.5 per hour, leak acceptable for the 95th percentile at 15.5 L/m on a pressure of 6 cm.    06/04/2014: He reports a prior diagnosis of obstructive sleep apnea. He was given a CPAP machine but has not used it in years. In fact, he does not currently have a CPAP machine. His sleep study may have been 35 years ago or so. He came to the [place] at the age of 28 from [place]. 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 He's  not been painting since his stroke. He also has cataracts. He had cataract extraction to the left eye in October but then suffered a stroke in November 2015 and his right eye cataract surgery was postponed from December 2 later this month. He has an appointment on 06/18/2014 for cataract surgery but would like to see if he can have a sleep study first. He would be willing to try CPAP again. He does snore. He goes to bed late, around 1 AM typically. He sleeps till 9:30 or 10 or so. He drinks 2 cups of coffee in the morning. He does not smoke. He does not drink alcohol. He likes to cook. He is currently not driving. He is walking without a cane or any walking aid. His metoprolol was reduced recently because of low pulse rate. His blood pressure has crept up a little bit. He takes every day. He has not had any new neurological symptoms. He typically does not have a headache but at the time of his stroke diagnosis he had a right parietal headache, especially when coughing. I reviewed hospital records from his admission in November 2015. He was admitted on 12/14/2013 through 12/20/2013 and discharged to rehabilitation for he stayed from 12/20/2013 through 12/26/2013. He had an MRI brain without contrast on 12/17/2013: Large RIGHT temporal occipital lobar hemorrhage with surrounding edema. Approximate volume 21 mL. Probable trace subarachnoid and intraventricular blood. Probable lobar hypertensive bleed. Cerebral amyloid angiopathy not excluded. No obvious underlying mass or vascular malformation.   Subcentimeter focus of acute infarction RIGHT lateral temporal lobe, remote from the hemorrhage.   No intracranial flow limiting stenosis or vascular malformation.   He has a family history of stroke. He's not sure if there is a family history of obstructive sleep apnea. He has 2 grown children from his first marriage. He has been married for 29 years to his current wife.   His Epworth sleepiness score is 22 out  of 24 today and fatigue score is 55, both elevated.  His Past Medical History Is Significant For: Past Medical History:  Diagnosis Date  . CVA (cerebral infarction) 12/2013  . Hypertension   . Sleep difficulties   . Stroke [facility])     His Past Surgical History Is Significant For: Past Surgical History:  Procedure Laterality Date  . ABDOMINAL SURGERY    . CATARACT EXTRACTION Left     His Family History Is Significant For: Family History  Problem Relation Age of Onset  . Hyperlipidemia Mother   . Hypertension Mother   . Hypertension Father     His Social History Is Significant For: Social History   Social History  . Marital status: Married    Spouse name: [name]  . Number of children: 2  . Years of education: college   Occupational History  .      retired   Social History Main Topics  . Smoking status: Never Smoker  .  Smokeless tobacco: Never Used  . Alcohol use No  . Drug use: No  . Sexual activity: Not Asked   Other Topics Concern  . None   Social History Narrative   Patient lives at home with his wife [name].   Retired.   Education Retail buyer.   Right handed.   Caffeine two cups of coffee daily.    His Allergies Are:  No Known Allergies:   His Current Medications Are:  Outpatient Encounter Prescriptions as of 10/14/2015  Medication Sig  . aspirin 81 MG tablet Take 81 mg by mouth daily.  . fexofenadine (ALLEGRA) 30 MG tablet Take 30 mg by mouth 2 (two) times daily.  . hydrochlorothiazide (HYDRODIURIL) 25 MG tablet Take 1.5 tablets (37.5 mg total) by mouth daily.  Marland Kitchen losartan (COZAAR) 100 MG tablet Take 100 mg by mouth daily.  . metoprolol (LOPRESSOR) 50 MG tablet Take 50 mg by mouth daily.   . Probiotic Product (PROBIOTIC DAILY PO) Take by mouth daily.  . ranitidine (ZANTAC) 150 MG tablet Take 150 mg by mouth 2 (two) times daily.  . simvastatin (ZOCOR) 40 MG tablet Take 40 mg by mouth daily.  . Travoprost, BAK Free, (TRAVATAN) 0.004 % SOLN  ophthalmic solution Place 1 drop into both eyes at bedtime.   No facility-administered encounter medications on file as of 10/14/2015.   :  Review of Systems:  Out of a complete 14 point review of systems, all are reviewed and negative with the exception of these symptoms as listed below:  Review of Systems  Neurological:       Patient states that he is doing well with CPAP. He says that the air from the mask is "a little too cold". Reports that it causes him to "freeze" at night.     Objective:  Neurologic Exam  Physical Exam Physical Examination:   Vitals:   10/14/15 1159  BP: (!) 152/80  Pulse: 60  Resp: 16   General Examination: The patient is a very pleasant 82 y.o. male in no acute distress. He appears well-developed and well-nourished and well groomed. He is in good spirits today.  HEENT: Normocephalic, atraumatic, pupils are equal, round and reactive to light and accommodation. He is status post cataract surgery in the left and has a fairly dense cataract on the right. Extraocular tracking is good without limitation to gaze excursion or nystagmus noted. Normal smooth pursuit is noted. On visual field testing, he has some decrease in the left visual field on finger perimetry. Hearing is grossly intact. Face is symmetric with normal facial animation and normal facial sensation. Speech is clear with no dysarthria noted. There is no hypophonia. There is no lip, neck/head, jaw or voice tremor. Neck is supple with full range of passive and active motion. There are no carotid bruits on auscultation. Oropharynx exam reveals: mild mouth dryness, adequate dental hygiene and moderate airway crowding, due to redundant soft palate. Mallampati is class III. Tongue protrudes centrally and palate elevates symmetrically. Tonsils are absent. He has a Mild overbite. Nasal inspection reveals no significant nasal mucosal bogginess or redness and no septal deviation. No sores around nostrils.   Chest:  Clear to auscultation without wheezing, but mild bibasilar crackles noted.  Heart: S1+S2+0, regular and normal without murmurs, rubs or gallops noted.   Abdomen: Soft, non-tender and non-distended with normal bowel sounds appreciated on auscultation.  Extremities: There is no pitting edema in the distal lower extremities bilaterally.   Skin: Warm and dry without trophic  changes noted. There are no varicose veins.  Musculoskeletal: exam reveals no obvious joint deformities, tenderness or joint swelling or erythema.   Neurologically:  Mental status: The patient is awake, alert and oriented in all 4 spheres. His immediate and remote memory, attention, language skills and fund of knowledge are appropriate. There is no evidence of aphasia, agnosia, apraxia or anomia. Speech is clear with normal prosody and enunciation. Thought process is linear. Mood is normal and affect is normal.  Cranial nerves II - XII are as described above under HEENT exam. In addition: shoulder shrug is normal with equal shoulder height noted. Motor exam: Normal bulk, strength and tone is noted. There is no drift, tremor or rebound. Romberg is negative. Reflexes are 2+ throughout. Fine motor skills and coordination: intact with normal finger taps, normal hand movements, normal rapid alternating patting, normal foot taps and normal foot agility.  Cerebellar testing: No dysmetria or intention tremor on finger to nose testing. Sensory exam: intact to light touch in the UEs and LEs.  Gait, station and balance: He stands easily. No veering to one side is noted. No leaning to one side is noted. Posture is age-appropriate and stance is narrow based. Gait shows mild gait insecurity, not able to do tandem walk.  Assessment and Plan:  In summary, Brysin Rowyn Spilde is a very pleasant 82 year old male with an underlying medical history of hypertension, overweight state, and stroke secondary to right parietal intracerebral hemorrhage with  residual left homonymous hemianopsia, who presents for follow up consultation of his obstructive sleep apnea, Established on CPAP therapy. In the past, he was diagnosed with OSA but stopped using his machine. He had a split-night sleep study in June 2016. He did well with CPAP of 6 cm. In fact, he is fully compliant with treatment. He had evidence of moderate obstructive sleep apnea and good results with low pressure CPAP. He is advised about his compliance again today and physical exam is stable with the exception of mild bibasilar crackles noted. He denies any symptoms. He and his wife are advised that if he should develop productive cough, fever, shortness of breath he should immediately get in touch with his primary care physician. He had a recent checkup with his PCP and everything was stable per his report.  We briefly talked about his prior sleep study results again today and reviewed his most recent compliance data together. I suggested he continue with treatment. He is advised to stay active physically and spend less time in bed as he almost averages 10 hours in bed on average daily basis. I suggested a one-year checkup for sleep apnea. I answered all her questions today and the patient and his wife were in agreement.  I spent 25 minutes in total face-to-face time with the patient, more than 50% of which was spent in counseling and coordination of care, reviewing test results, reviewing medication and discussing or reviewing the diagnosis of OSA, its prognosis and treatment options.

## 2015-10-21 ENCOUNTER — Encounter: Payer: Self-pay | Admitting: *Deleted

## 2016-03-08 ENCOUNTER — Emergency Department (HOSPITAL_COMMUNITY): Payer: Medicare Other

## 2016-03-08 ENCOUNTER — Inpatient Hospital Stay (HOSPITAL_COMMUNITY)
Admission: EM | Admit: 2016-03-08 | Discharge: 2016-03-12 | DRG: 083 | Disposition: A | Discharge status: Home / Self Care | Payer: Medicare Other | Attending: Internal Medicine | Admitting: Internal Medicine

## 2016-03-08 ENCOUNTER — Encounter (HOSPITAL_COMMUNITY): Payer: Self-pay | Admitting: Emergency Medicine

## 2016-03-08 DIAGNOSIS — S2232XA Fracture of one rib, left side, initial encounter for closed fracture: Secondary | ICD-10-CM | POA: Diagnosis present

## 2016-03-08 DIAGNOSIS — S066X9A Traumatic subarachnoid hemorrhage with loss of consciousness of unspecified duration, initial encounter: Principal | ICD-10-CM | POA: Diagnosis present

## 2016-03-08 DIAGNOSIS — R2689 Other abnormalities of gait and mobility: Secondary | ICD-10-CM

## 2016-03-08 DIAGNOSIS — G4730 Sleep apnea, unspecified: Secondary | ICD-10-CM | POA: Diagnosis present

## 2016-03-08 DIAGNOSIS — Z8673 Personal history of transient ischemic attack (TIA), and cerebral infarction without residual deficits: Secondary | ICD-10-CM

## 2016-03-08 DIAGNOSIS — G4733 Obstructive sleep apnea (adult) (pediatric): Secondary | ICD-10-CM | POA: Diagnosis present

## 2016-03-08 DIAGNOSIS — G9340 Encephalopathy, unspecified: Secondary | ICD-10-CM | POA: Diagnosis present

## 2016-03-08 DIAGNOSIS — Z7982 Long term (current) use of aspirin: Secondary | ICD-10-CM

## 2016-03-08 DIAGNOSIS — B952 Enterococcus as the cause of diseases classified elsewhere: Secondary | ICD-10-CM | POA: Diagnosis present

## 2016-03-08 DIAGNOSIS — I609 Nontraumatic subarachnoid hemorrhage, unspecified: Secondary | ICD-10-CM

## 2016-03-08 DIAGNOSIS — W19XXXA Unspecified fall, initial encounter: Secondary | ICD-10-CM

## 2016-03-08 DIAGNOSIS — I69354 Hemiplegia and hemiparesis following cerebral infarction affecting left non-dominant side: Secondary | ICD-10-CM

## 2016-03-08 DIAGNOSIS — Z79899 Other long term (current) drug therapy: Secondary | ICD-10-CM

## 2016-03-08 DIAGNOSIS — Z8249 Family history of ischemic heart disease and other diseases of the circulatory system: Secondary | ICD-10-CM

## 2016-03-08 DIAGNOSIS — R0789 Other chest pain: Secondary | ICD-10-CM

## 2016-03-08 DIAGNOSIS — N390 Urinary tract infection, site not specified: Secondary | ICD-10-CM | POA: Diagnosis present

## 2016-03-08 DIAGNOSIS — I10 Essential (primary) hypertension: Secondary | ICD-10-CM | POA: Diagnosis present

## 2016-03-08 LAB — CBC WITH DIFFERENTIAL/PLATELET
BASOS ABS: 0 10*3/uL (ref 0.0–0.1)
BASOS PCT: 0 %
Eosinophils Absolute: 0 10*3/uL (ref 0.0–0.7)
Eosinophils Relative: 0 %
HCT: 39.5 % (ref 39.0–52.0)
Hemoglobin: 13 g/dL (ref 13.0–17.0)
Lymphocytes Relative: 13 %
Lymphs Abs: 1.8 10*3/uL (ref 0.7–4.0)
MCH: 30.4 pg (ref 26.0–34.0)
MCHC: 32.9 g/dL (ref 30.0–36.0)
MCV: 92.5 fL (ref 78.0–100.0)
MONO ABS: 1.4 10*3/uL — AB (ref 0.1–1.0)
Monocytes Relative: 10 %
NEUTROS ABS: 10.4 10*3/uL — AB (ref 1.7–7.7)
Neutrophils Relative %: 77 %
Platelets: 244 10*3/uL (ref 150–400)
RBC: 4.27 MIL/uL (ref 4.22–5.81)
RDW: 13.5 % (ref 11.5–15.5)
WBC: 13.6 10*3/uL — AB (ref 4.0–10.5)

## 2016-03-08 LAB — COMPREHENSIVE METABOLIC PANEL
ALT: 20 U/L (ref 17–63)
ANION GAP: 13 (ref 5–15)
AST: 29 U/L (ref 15–41)
Albumin: 3.8 g/dL (ref 3.5–5.0)
Alkaline Phosphatase: 39 U/L (ref 38–126)
BILIRUBIN TOTAL: 0.8 mg/dL (ref 0.3–1.2)
BUN: 34 mg/dL — ABNORMAL HIGH (ref 6–20)
CALCIUM: 9.3 mg/dL (ref 8.9–10.3)
CO2: 22 mmol/L (ref 22–32)
Chloride: 102 mmol/L (ref 101–111)
Creatinine, Ser: 1.45 mg/dL — ABNORMAL HIGH (ref 0.61–1.24)
GFR calc non Af Amer: 43 mL/min — ABNORMAL LOW (ref >60)
GFR, EST AFRICAN AMERICAN: 50 mL/min — AB (ref >60)
GLUCOSE: 137 mg/dL — AB (ref 65–99)
Potassium: 3.9 mmol/L (ref 3.5–5.1)
Sodium: 137 mmol/L (ref 135–145)
TOTAL PROTEIN: 7.4 g/dL (ref 6.5–8.1)

## 2016-03-08 LAB — CBG MONITORING, ED: Glucose-Capillary: 127 mg/dL — ABNORMAL HIGH (ref 65–99)

## 2016-03-08 NOTE — ED Provider Notes (Signed)
[place] DEPT Provider Note   CSN: [id number] Arrival date & time: 03/08/16  2100     History   Chief Complaint Chief Complaint  Patient presents with  . Fall    HPI Brian Wolf is a 83 y.o. male.with history of CVA with residual left homonymous hemianopsia, OSA on CPAP at night and hypertension that presents to the ED following a fall that he denies happened.  Wife is at the bedside and states that she heard a loud noise around 7:30pm and walked into the room, turned on the lights to find her husband on the floor.  He was not unconscious when she found him.  She said he had difficulty getting up from the ground and she called 911.  Wife reports patient is more confused than usual.  Patient is oriented to person and place but unable to tell me the year or month.  Wife also reports that the patient has been having a cough and visited his primary care doctor today to rule out the flu, which was negative.   HPI  Past Medical History:  Diagnosis Date  . CVA (cerebral infarction) 12/2013  . Hypertension   . Sleep difficulties   . Stroke [facility])     Patient Active Problem List   Diagnosis Date Noted  . History of stroke 03/09/2016  . Sleep apnea 03/09/2016  . SAH (subarachnoid hemorrhage) ([place]) 03/09/2016  . Subarachnoid bleed ([place]) 03/08/2016  . Hypertension   . Sleep difficulties   . Homonymous hemianopsia 01/14/2014  . Homonymous hemianopsia due to recent cerebral infarction 12/24/2013  . Left-sided neglect 12/24/2013  . Gout flare 12/20/2013  . Benign essential HTN 12/20/2013  . Acute hemorrhagic infarction of brain ([place]) 12/20/2013  . Hemorrhagic stroke ([place])   . Other secondary acute gout of left foot   . Hypokalemia 12/19/2013  . Altered mental status   . Encounter for central line placement   . Encephalopathy acute   . ICH (intracerebral hemorrhage) ([place]) 12/14/2013  . Fever     Past Surgical History:  Procedure Laterality Date  . ABDOMINAL SURGERY      . CATARACT EXTRACTION Left        Home Medications    Prior to Admission medications   Medication Sig Start Date End Date Taking? Authorizing Provider  aspirin 81 MG tablet Take 81 mg by mouth daily.   Yes Historical Provider, MD  fexofenadine (ALLEGRA) 30 MG tablet Take 30 mg by mouth 2 (two) times daily.   Yes Historical Provider, MD  hydrochlorothiazide (HYDRODIURIL) 25 MG tablet Take 1.5 tablets (37.5 mg total) by mouth daily. 12/26/13  Yes Ivan Anchors Love, PA-C  losartan (COZAAR) 100 MG tablet Take 100 mg by mouth daily. 11/30/13  Yes Historical Provider, MD  metoprolol (LOPRESSOR) 50 MG tablet Take 50 mg by mouth daily.  11/30/13  Yes Historical Provider, MD  Probiotic Product (PROBIOTIC DAILY PO) Take 1 capsule by mouth daily.    Yes Historical Provider, MD  ranitidine (ZANTAC) 150 MG tablet Take 150 mg by mouth 2 (two) times daily.   Yes Historical Provider, MD  simvastatin (ZOCOR) 40 MG tablet Take 40 mg by mouth daily. 11/30/13  Yes Historical Provider, MD  Travoprost, BAK Free, (TRAVATAN) 0.004 % SOLN ophthalmic solution Place 1 drop into both eyes at bedtime.   Yes Historical Provider, MD    Family History Family History  Problem Relation Age of Onset  . Hyperlipidemia Mother   . Hypertension Mother   .  Hypertension Father     Social History Social History  Substance Use Topics  . Smoking status: Never Smoker  . Smokeless tobacco: Never Used  . Alcohol use No     Allergies   Patient has no known allergies.   Review of Systems Review of Systems  Respiratory: Negative for shortness of breath.   Cardiovascular: Negative for chest pain.  Gastrointestinal: Negative for abdominal pain.  Musculoskeletal: Negative for back pain and neck pain.     Physical Exam Updated Vital Signs BP 112/74 (BP Location: Left Arm)   Pulse (!) 101   Temp 97.8 F (36.6 C) (Oral)   Resp 18   Ht 5\' 3"  (1.6 m)   Wt 57.4 kg   SpO2 95%   BMI 22.43 kg/m   Physical Exam   Eyes: EOM are normal. Pupils are equal, round, and reactive to light.  Small abrasion over right eyebrow Right eye with red conjunctiva   Neck: Normal range of motion.  Cardiovascular: Normal rate, regular rhythm and normal heart sounds.  Exam reveals no gallop and no friction rub.   No murmur heard. Pulmonary/Chest: Effort normal and breath sounds normal. No respiratory distress. He has no wheezes. He has no rales.  Abdominal: Soft. He exhibits no distension. There is no tenderness.  Neurological: No cranial nerve deficit.  Oriented to person and place but not time      ED Treatments / Results  Labs (all labs ordered are listed, but only abnormal results are displayed) Labs Reviewed  URINALYSIS, ROUTINE W REFLEX MICROSCOPIC - Abnormal; Notable for the following:       Result Value   APPearance HAZY (*)    Protein, ur 30 (*)    Leukocytes, UA MODERATE (*)    Bacteria, UA RARE (*)    Squamous Epithelial / LPF 0-5 (*)    All other components within normal limits  COMPREHENSIVE METABOLIC PANEL - Abnormal; Notable for the following:    Glucose, Bld 137 (*)    BUN 34 (*)    Creatinine, Ser 1.45 (*)    GFR calc non Af Amer 43 (*)    GFR calc Af Amer 50 (*)    All other components within normal limits  CBC WITH DIFFERENTIAL/PLATELET - Abnormal; Notable for the following:    WBC 13.6 (*)    Neutro Abs 10.4 (*)    Monocytes Absolute 1.4 (*)    All other components within normal limits  COMPREHENSIVE METABOLIC PANEL - Abnormal; Notable for the following:    Glucose, Bld 132 (*)    BUN 33 (*)    Creatinine, Ser 1.42 (*)    Albumin 3.4 (*)    GFR calc non Af Amer 44 (*)    GFR calc Af Amer 52 (*)    All other components within normal limits  CBC WITH DIFFERENTIAL/PLATELET - Abnormal; Notable for the following:    WBC 11.5 (*)    RBC 4.01 (*)    Hemoglobin 12.1 (*)    HCT 37.1 (*)    Monocytes Absolute 1.3 (*)    All other components within normal limits  CBG MONITORING, ED -  Abnormal; Notable for the following:    Glucose-Capillary 127 (*)    All other components within normal limits  URINE CULTURE    EKG  EKG Interpretation None       Radiology Dg Chest 2 View  Result Date: 03/08/2016 CLINICAL DATA:  Cough EXAM: CHEST  2 VIEW COMPARISON:  Chest  radiograph 12/16/2013 FINDINGS: Previously seen central venous catheter is been removed. The cardiomediastinal silhouette is unchanged. There is no focal airspace consolidation or pulmonary edema. No pneumothorax or pleural effusion. Lung inflation shallow. Bibasilar atelectasis. IMPRESSION: Shallow lung inflation and bibasilar atelectasis. No focal airspace consolidation or pulmonary edema. Electronically Signed   By: Ulyses Jarred M.D.   On: 03/08/2016 22:51   Ct Head Wo Contrast  Result Date: 03/08/2016 CLINICAL DATA:  Fall with abrasion to the left eyelid. EXAM: CT HEAD WITHOUT CONTRAST TECHNIQUE: Contiguous axial images were obtained from the base of the skull through the vertex without intravenous contrast. COMPARISON:  Brain MRI 12/17/2013 FINDINGS: Brain: There is a small focus of hemorrhage along the medial right temporal lobe, measuring approximately 14 x 9 x 7 mm. There is no other evidence of acute hemorrhage. There is no midline shift or mass effect. There is right occipital encephalomalacia at the site of prior hemorrhagic event. There is no evidence of acute cortical infarct. There is periventricular hypoattenuation compatible with chronic microvascular disease. Vascular: Atherosclerotic calcification of the vertebral and internal carotid arteries at the skull base. Skull: Normal. Negative for fracture or focal lesion. Sinuses/Orbits: There is a small left frontal sinus osteoma. No paranasal sinus fluid levels or advanced mucosal thickening. No mastoid effusion. Bilateral lens replacements. Other: None. IMPRESSION: 1. Small focus of hemorrhage along the medial right temporal lobe, favored to be subarachnoid,  however an intraparenchymal location would be difficult to exclude. The hematoma measures approximately 14 x 9 x 7 mm without associated mass effect. 2. Right parietal encephalomalacia at the site of prior hemorrhagic insult. Critical Value/emergent results were called by telephone at the time of interpretation on 03/08/2016 at 11:02 pm to Dr. Dayna Barker, who verbally acknowledged these results. Electronically Signed   By: Ulyses Jarred M.D.   On: 03/08/2016 23:03    Procedures Procedures (including critical care time)  Medications Ordered in ED Medications  loratadine (CLARITIN) tablet 10 mg (10 mg Oral Given 03/09/16 0821)  famotidine (PEPCID) tablet 20 mg (20 mg Oral Given 03/09/16 0821)  metoprolol (LOPRESSOR) tablet 50 mg (50 mg Oral Given 03/09/16 0821)  simvastatin (ZOCOR) tablet 40 mg (40 mg Oral Given 03/09/16 0821)  latanoprost (XALATAN) 0.005 % ophthalmic solution 1 drop (not administered)  hydrALAZINE (APRESOLINE) injection 10 mg (not administered)  acetaminophen (TYLENOL) tablet 650 mg (not administered)    Or  acetaminophen (TYLENOL) suppository 650 mg (not administered)  ondansetron (ZOFRAN) tablet 4 mg (not administered)    Or  ondansetron (ZOFRAN) injection 4 mg (not administered)  0.9 %  sodium chloride infusion (75 mL/hr Intravenous New Bag/Given 03/09/16 0438)  cefTRIAXone (ROCEPHIN) 1 g in dextrose 5 % 50 mL IVPB (1 g Intravenous Given 03/09/16 3007)     Initial Impression / Assessment and Plan / ED Course  I have reviewed the triage vital signs and the nursing notes.  Pertinent labs & imaging results that were available during my care of the patient were reviewed by me and considered in my medical decision making (see chart for details).    Patient presents to the ED following a fall that he denies happened.  Wife reports she found him on the floor after hearing a loud noise.  He appeared more weak and confused from baseline.  CT head showed a Small focus of hemorrhage along the  medial right temporal lobe, favored to be subarachnoid.  Discussed case with neurosurgery and no interventions required at this time.  Discussed the case  with triad hospitalist and agreeable to admit for observation.   Final Clinical Impressions(s) / ED Diagnoses   Final diagnoses:  Imbalance    New Prescriptions Current Discharge Medication List       Valinda Party, DO 03/09/16 1033    Merrily Pew, MD 03/09/16 [phone]

## 2016-03-08 NOTE — ED Triage Notes (Signed)
Per EMS: Pt fell and has abrasion on left eye lid. Wife wants pt checked out. Some neuro deficits from previous strokes. Pt is currently at baseline. Pt alert and oriented x4.

## 2016-03-09 ENCOUNTER — Observation Stay (HOSPITAL_BASED_OUTPATIENT_CLINIC_OR_DEPARTMENT_OTHER): Payer: Medicare Other

## 2016-03-09 ENCOUNTER — Observation Stay (HOSPITAL_BASED_OUTPATIENT_CLINIC_OR_DEPARTMENT_OTHER)
Admit: 2016-03-09 | Discharge: 2016-03-09 | Disposition: A | Discharge status: Home / Self Care | Payer: Medicare Other | Attending: Internal Medicine | Admitting: Internal Medicine

## 2016-03-09 ENCOUNTER — Encounter (HOSPITAL_COMMUNITY): Payer: Self-pay | Admitting: *Deleted

## 2016-03-09 DIAGNOSIS — I69354 Hemiplegia and hemiparesis following cerebral infarction affecting left non-dominant side: Secondary | ICD-10-CM | POA: Diagnosis not present

## 2016-03-09 DIAGNOSIS — S066X9A Traumatic subarachnoid hemorrhage with loss of consciousness of unspecified duration, initial encounter: Secondary | ICD-10-CM | POA: Diagnosis present

## 2016-03-09 DIAGNOSIS — G9340 Encephalopathy, unspecified: Secondary | ICD-10-CM

## 2016-03-09 DIAGNOSIS — I609 Nontraumatic subarachnoid hemorrhage, unspecified: Secondary | ICD-10-CM | POA: Diagnosis not present

## 2016-03-09 DIAGNOSIS — G4730 Sleep apnea, unspecified: Secondary | ICD-10-CM | POA: Diagnosis present

## 2016-03-09 DIAGNOSIS — N390 Urinary tract infection, site not specified: Secondary | ICD-10-CM | POA: Diagnosis present

## 2016-03-09 DIAGNOSIS — Z7982 Long term (current) use of aspirin: Secondary | ICD-10-CM | POA: Diagnosis not present

## 2016-03-09 DIAGNOSIS — Z79899 Other long term (current) drug therapy: Secondary | ICD-10-CM | POA: Diagnosis not present

## 2016-03-09 DIAGNOSIS — I10 Essential (primary) hypertension: Secondary | ICD-10-CM | POA: Diagnosis not present

## 2016-03-09 DIAGNOSIS — W19XXXD Unspecified fall, subsequent encounter: Secondary | ICD-10-CM | POA: Diagnosis not present

## 2016-03-09 DIAGNOSIS — G4733 Obstructive sleep apnea (adult) (pediatric): Secondary | ICD-10-CM | POA: Diagnosis present

## 2016-03-09 DIAGNOSIS — R55 Syncope and collapse: Secondary | ICD-10-CM | POA: Diagnosis not present

## 2016-03-09 DIAGNOSIS — W19XXXA Unspecified fall, initial encounter: Secondary | ICD-10-CM | POA: Diagnosis not present

## 2016-03-09 DIAGNOSIS — S2232XA Fracture of one rib, left side, initial encounter for closed fracture: Secondary | ICD-10-CM | POA: Diagnosis present

## 2016-03-09 DIAGNOSIS — Z8249 Family history of ischemic heart disease and other diseases of the circulatory system: Secondary | ICD-10-CM | POA: Diagnosis not present

## 2016-03-09 DIAGNOSIS — B952 Enterococcus as the cause of diseases classified elsewhere: Secondary | ICD-10-CM | POA: Diagnosis present

## 2016-03-09 DIAGNOSIS — Z8673 Personal history of transient ischemic attack (TIA), and cerebral infarction without residual deficits: Secondary | ICD-10-CM

## 2016-03-09 LAB — COMPREHENSIVE METABOLIC PANEL
ALBUMIN: 3.4 g/dL — AB (ref 3.5–5.0)
ALK PHOS: 38 U/L (ref 38–126)
ALT: 19 U/L (ref 17–63)
AST: 26 U/L (ref 15–41)
Anion gap: 11 (ref 5–15)
BILIRUBIN TOTAL: 0.7 mg/dL (ref 0.3–1.2)
BUN: 33 mg/dL — AB (ref 6–20)
CALCIUM: 9 mg/dL (ref 8.9–10.3)
CO2: 26 mmol/L (ref 22–32)
CREATININE: 1.42 mg/dL — AB (ref 0.61–1.24)
Chloride: 102 mmol/L (ref 101–111)
GFR calc Af Amer: 52 mL/min — ABNORMAL LOW (ref >60)
GFR, EST NON AFRICAN AMERICAN: 44 mL/min — AB (ref >60)
GLUCOSE: 132 mg/dL — AB (ref 65–99)
POTASSIUM: 3.8 mmol/L (ref 3.5–5.1)
Sodium: 139 mmol/L (ref 135–145)
TOTAL PROTEIN: 7 g/dL (ref 6.5–8.1)

## 2016-03-09 LAB — URINALYSIS, ROUTINE W REFLEX MICROSCOPIC
BILIRUBIN URINE: NEGATIVE
GLUCOSE, UA: NEGATIVE mg/dL
HGB URINE DIPSTICK: NEGATIVE
Ketones, ur: NEGATIVE mg/dL
Nitrite: NEGATIVE
PROTEIN: 30 mg/dL — AB
Specific Gravity, Urine: 1.015 (ref 1.005–1.030)
pH: 5 (ref 5.0–8.0)

## 2016-03-09 LAB — ECHOCARDIOGRAM COMPLETE
Height: 63 in
Weight: 2025.6 oz

## 2016-03-09 LAB — CBC WITH DIFFERENTIAL/PLATELET
BASOS ABS: 0 10*3/uL (ref 0.0–0.1)
Basophils Relative: 0 %
EOS PCT: 0 %
Eosinophils Absolute: 0 10*3/uL (ref 0.0–0.7)
HCT: 37.1 % — ABNORMAL LOW (ref 39.0–52.0)
Hemoglobin: 12.1 g/dL — ABNORMAL LOW (ref 13.0–17.0)
LYMPHS PCT: 22 %
Lymphs Abs: 2.5 10*3/uL (ref 0.7–4.0)
MCH: 30.2 pg (ref 26.0–34.0)
MCHC: 32.6 g/dL (ref 30.0–36.0)
MCV: 92.5 fL (ref 78.0–100.0)
MONO ABS: 1.3 10*3/uL — AB (ref 0.1–1.0)
Monocytes Relative: 12 %
Neutro Abs: 7.6 10*3/uL (ref 1.7–7.7)
Neutrophils Relative %: 66 %
PLATELETS: 232 10*3/uL (ref 150–400)
RBC: 4.01 MIL/uL — ABNORMAL LOW (ref 4.22–5.81)
RDW: 13.6 % (ref 11.5–15.5)
WBC: 11.5 10*3/uL — ABNORMAL HIGH (ref 4.0–10.5)

## 2016-03-09 MED ORDER — SIMVASTATIN 40 MG PO TABS
40.0000 mg | ORAL_TABLET | Freq: Every day | ORAL | Status: DC
  Administered 2016-03-09 – 2016-03-12 (×4): 40 mg via ORAL
  Filled 2016-03-09 (×4): qty 1

## 2016-03-09 MED ORDER — PERFLUTREN LIPID MICROSPHERE
1.0000 mL | INTRAVENOUS | Status: AC | PRN
  Administered 2016-03-09: 2 mL via INTRAVENOUS
  Filled 2016-03-09: qty 10

## 2016-03-09 MED ORDER — ACETAMINOPHEN 325 MG PO TABS
650.0000 mg | ORAL_TABLET | Freq: Four times a day (QID) | ORAL | Status: DC | PRN
  Administered 2016-03-11 – 2016-03-12 (×4): 650 mg via ORAL
  Filled 2016-03-09 (×4): qty 2

## 2016-03-09 MED ORDER — ONDANSETRON HCL 4 MG/2ML IJ SOLN: 4.0000 mg | Freq: Four times a day (QID) | INTRAMUSCULAR | Status: DC | PRN

## 2016-03-09 MED ORDER — SODIUM CHLORIDE 0.9 % IV SOLN
INTRAVENOUS | Status: AC
  Administered 2016-03-09: 75 mL/h via INTRAVENOUS

## 2016-03-09 MED ORDER — LATANOPROST 0.005 % OP SOLN
1.0000 [drp] | Freq: Every day | OPHTHALMIC | Status: DC
  Administered 2016-03-10 – 2016-03-11 (×2): 1 [drp] via OPHTHALMIC
  Filled 2016-03-09: qty 2.5

## 2016-03-09 MED ORDER — LOSARTAN POTASSIUM 50 MG PO TABS: 100.0000 mg | ORAL_TABLET | Freq: Every day | ORAL | Status: DC

## 2016-03-09 MED ORDER — HYDRALAZINE HCL 20 MG/ML IJ SOLN: 10.0000 mg | INTRAMUSCULAR | Status: DC | PRN

## 2016-03-09 MED ORDER — ONDANSETRON HCL 4 MG PO TABS
4.0000 mg | ORAL_TABLET | Freq: Four times a day (QID) | ORAL | Status: DC | PRN
Start: 2016-03-09 — End: 2016-03-12

## 2016-03-09 MED ORDER — METOPROLOL TARTRATE 50 MG PO TABS
50.0000 mg | ORAL_TABLET | Freq: Every day | ORAL | Status: DC
  Administered 2016-03-09 – 2016-03-12 (×4): 50 mg via ORAL
  Filled 2016-03-09 (×4): qty 1

## 2016-03-09 MED ORDER — ACETAMINOPHEN 650 MG RE SUPP: 650.0000 mg | Freq: Four times a day (QID) | RECTAL | Status: DC | PRN

## 2016-03-09 MED ORDER — FAMOTIDINE 20 MG PO TABS
20.0000 mg | ORAL_TABLET | Freq: Two times a day (BID) | ORAL | Status: DC
  Administered 2016-03-09 – 2016-03-12 (×7): 20 mg via ORAL
  Filled 2016-03-09 (×7): qty 1

## 2016-03-09 MED ORDER — DEXTROSE 5 % IV SOLN
1.0000 g | Freq: Every day | INTRAVENOUS | Status: DC
  Administered 2016-03-09 – 2016-03-12 (×4): 1 g via INTRAVENOUS
  Filled 2016-03-09 (×4): qty 10

## 2016-03-09 MED ORDER — LORATADINE 10 MG PO TABS
10.0000 mg | ORAL_TABLET | Freq: Every day | ORAL | Status: DC
  Administered 2016-03-09 – 2016-03-12 (×4): 10 mg via ORAL
  Filled 2016-03-09 (×4): qty 1

## 2016-03-09 NOTE — Care Management Note (Signed)
Case Management Note  Patient Details  Name: Brian Wolf MRN: [id number] Date of Birth: [birth year]  Subjective/Objective:      Subarachnoid bleed, HTN              Action/Plan: Discharge Planning: NCM spoke to pt and wife at bedside. Pt states he goes to the [facility] and requested dc summary faxed to [place] at dc. Will continue to follow for dc needs.   PCP Gaynelle Arabian MD    Expected Discharge Date:                  Expected Discharge Plan:  Home/Self Care  In-House Referral:  NA  Discharge planning Services  CM Consult  Post Acute Care Choice:    Choice offered to:     DME Arranged:    DME Agency:     HH Arranged:    HH Agency:     Status of Service:  In process, will continue to follow  If discussed at Long Length of Stay Meetings, dates discussed:    Additional Comments:  Erenest Rasher, RN 03/09/2016, 7:30 PM

## 2016-03-09 NOTE — Care Management Obs Status (Signed)
[place] NOTIFICATION   Patient Details  Name: Brian Wolf MRN: [id number] Date of Birth: 02/19/1931   Medicare Observation Status Notification Given:  Yes    Erenest Rasher, RN 03/09/2016, 7:22 PM

## 2016-03-09 NOTE — Progress Notes (Signed)
PROGRESS NOTE    Nino Amano  DGU:[id number] DOB: 02/11/1931 DOA: 03/08/2016 PCP: Simona Huh, MD    Brief Narrative:  This is an 83 year old male who has history of CVA with left-sided hemiparesis. He presents after sustaining a fall. The fall was unwitnessed, patient was found down, apparently her wife found him unconscious immediately after the fall, he recovered his consciousness slowly, was noted to be confused. CT head showed subarachnoid bleed. Neurosurgery contacted the recommendations for admission observation.  Assessment & Plan:   Principal Problem:   Subarachnoid bleed ([place]) Active Problems:   Encephalopathy acute   Hypertension   History of stroke   Sleep apnea   SAH (subarachnoid hemorrhage) ([place])  1. Medial right temporal lobe subarachnoid bleed, hematoma measuring 1497 mm without associated mass effect. Patient has been admitted to the medical floor, continue neuro checks, avoid antiplatelets or anticoagulants. Follow neurosurgery recommendations. Echocardiogram with normal LV function.  2. Acute encephalopathy. We'll follow up on EEG report.  3. Acute renal failure. Holding on diuretics and ARB. Gentle hydration with IV fluids.  4. Hypertension. Currently holding antihypertensive agents. Use hydralazine as needed.    DVT prophylaxis: scd  Code Status: Full Family Communication: No family at the bedside  Disposition Plan: home    Consultants:   Neurology   Procedures:    Antimicrobials:       Subjective: Patient feeling well, does not recall the detail events of what happen last night. No tongue biting, unclear loss of consciousness.   Objective: Vitals:   03/09/16 0500 03/09/16 1115 03/09/16 1318 03/09/16 1458  BP: 112/74 (!) 120/50 (!) 125/54 (!) 133/52  Pulse: (!) 101 (!) 58 61 67  Resp: 18 17  18   Temp: 97.8 F (36.6 C) 98.6 F (37 C) 98.1 F (36.7 C) 98.3 F (36.8 C)  TempSrc: Oral Oral Oral Oral  SpO2: 95% 98% 100% 100%   Weight:      Height:        Intake/Output Summary (Last 24 hours) at 03/09/16 1612 Last data filed at 03/09/16 1108  Gross per 24 hour  Intake            827.5 ml  Output              250 ml  Net            577.5 ml   Filed Weights   03/09/16 0124  Weight: 57.4 kg (126 lb 9.6 oz)    Examination:  General exam: Patient not in pain or dyspnea E ENT. No pallor or icterus Respiratory system: Clear to auscultation. Respiratory effort normal. Cardiovascular system: S1 & S2 heard, RRR. No JVD, murmurs, rubs, gallops or clicks. No pedal edema. Gastrointestinal system: Abdomen is nondistended, soft and nontender. No organomegaly or masses felt. Normal bowel sounds heard. Central nervous system: Alert and oriented. No focal neurological deficits. Extremities: Symmetric 5 x 5 power. Skin: No rashes, lesions or ulcers. Mild echymmosis on the right face.    Data Reviewed: I have personally reviewed following labs and imaging studies  CBC:  Recent Labs Lab 03/08/16 2303 03/09/16 0512  WBC 13.6* 11.5*  NEUTROABS 10.4* 7.6  HGB 13.0 12.1*  HCT 39.5 37.1*  MCV 92.5 92.5  PLT 244 956   Basic Metabolic Panel:  Recent Labs Lab 03/08/16 2303 03/09/16 0512  NA 137 139  K 3.9 3.8  CL 102 102  CO2 22 26  GLUCOSE 137* 132*  BUN 34* 33*  CREATININE 1.45*  1.42*  CALCIUM 9.3 9.0   GFR: Estimated Creatinine Clearance: 32.3 mL/min (by C-G formula based on SCr of 1.42 mg/dL (H)). Liver Function Tests:  Recent Labs Lab 03/08/16 2303 03/09/16 0512  AST 29 26  ALT 20 19  ALKPHOS 39 38  BILITOT 0.8 0.7  PROT 7.4 7.0  ALBUMIN 3.8 3.4*   No results for input(s): LIPASE, AMYLASE in the last 168 hours. No results for input(s): AMMONIA in the last 168 hours. Coagulation Profile: No results for input(s): INR, PROTIME in the last 168 hours. Cardiac Enzymes: No results for input(s): CKTOTAL, CKMB, CKMBINDEX, TROPONINI in the last 168 hours. BNP (last 3 results) No results for  input(s): PROBNP in the last 8760 hours. HbA1C: No results for input(s): HGBA1C in the last 72 hours. CBG:  Recent Labs Lab 03/08/16 2114  GLUCAP 127*   Lipid Profile: No results for input(s): CHOL, HDL, LDLCALC, TRIG, CHOLHDL, LDLDIRECT in the last 72 hours. Thyroid Function Tests: No results for input(s): TSH, T4TOTAL, FREET4, T3FREE, THYROIDAB in the last 72 hours. Anemia Panel: No results for input(s): VITAMINB12, FOLATE, FERRITIN, TIBC, IRON, RETICCTPCT in the last 72 hours. Sepsis Labs: No results for input(s): PROCALCITON, LATICACIDVEN in the last 168 hours.  No results found for this or any previous visit (from the past 240 hour(s)).       Radiology Studies: Dg Chest 2 View  Result Date: 03/08/2016 CLINICAL DATA:  Cough EXAM: CHEST  2 VIEW COMPARISON:  Chest radiograph 12/16/2013 FINDINGS: Previously seen central venous catheter is been removed. The cardiomediastinal silhouette is unchanged. There is no focal airspace consolidation or pulmonary edema. No pneumothorax or pleural effusion. Lung inflation shallow. Bibasilar atelectasis. IMPRESSION: Shallow lung inflation and bibasilar atelectasis. No focal airspace consolidation or pulmonary edema. Electronically Signed   By: Ulyses Jarred M.D.   On: 03/08/2016 22:51   Ct Head Wo Contrast  Result Date: 03/08/2016 CLINICAL DATA:  Fall with abrasion to the left eyelid. EXAM: CT HEAD WITHOUT CONTRAST TECHNIQUE: Contiguous axial images were obtained from the base of the skull through the vertex without intravenous contrast. COMPARISON:  Brain MRI 12/17/2013 FINDINGS: Brain: There is a small focus of hemorrhage along the medial right temporal lobe, measuring approximately 14 x 9 x 7 mm. There is no other evidence of acute hemorrhage. There is no midline shift or mass effect. There is right occipital encephalomalacia at the site of prior hemorrhagic event. There is no evidence of acute cortical infarct. There is periventricular  hypoattenuation compatible with chronic microvascular disease. Vascular: Atherosclerotic calcification of the vertebral and internal carotid arteries at the skull base. Skull: Normal. Negative for fracture or focal lesion. Sinuses/Orbits: There is a small left frontal sinus osteoma. No paranasal sinus fluid levels or advanced mucosal thickening. No mastoid effusion. Bilateral lens replacements. Other: None. IMPRESSION: 1. Small focus of hemorrhage along the medial right temporal lobe, favored to be subarachnoid, however an intraparenchymal location would be difficult to exclude. The hematoma measures approximately 14 x 9 x 7 mm without associated mass effect. 2. Right parietal encephalomalacia at the site of prior hemorrhagic insult. Critical Value/emergent results were called by telephone at the time of interpretation on 03/08/2016 at 11:02 pm to Dr. Dayna Barker, who verbally acknowledged these results. Electronically Signed   By: Ulyses Jarred M.D.   On: 03/08/2016 23:03        Scheduled Meds: . cefTRIAXone (ROCEPHIN)  IV  1 g Intravenous Daily  . famotidine  20 mg Oral BID  .  latanoprost  1 drop Both Eyes QHS  . loratadine  10 mg Oral Daily  . metoprolol  50 mg Oral Daily  . simvastatin  40 mg Oral Daily   Continuous Infusions: . sodium chloride 75 mL/hr (03/09/16 0438)     LOS: 0 days        Thurley Francesconi Gerome Apley, MD [facility] Pager [phone]  If 7PM-7AM, please contact night-coverage [email or web address] Password [email or web address] 03/09/2016, 4:12 PM

## 2016-03-09 NOTE — H&P (Signed)
History and Physical    Brian Wolf WEX:[id number] DOB: 1931/04/15 DOA: 03/08/2016  PCP: Simona Huh, MD  Patient coming from: Home.  History obtained from patient's wife.  Chief Complaint: Fall.  HPI: Brian Wolf is a 83 y.o. male with history of stroke with left-sided hemiparesis, sleep apnea, hypertension was brought to the ER the patient had a fall. As per the patient's wife patient had an unwitnessed fall while patient was in his room. Patient does not recall how he fell. Patient's wife immediately went to the patient's room and found him on the floor but was conscious. Did not have any incontinence of urine or bowel. But patient looks confused. In the ER patient had CT head which showed subarachnoid bleed and Dr. Trenton Gammon on call neurosurgery was consulted and Dr. Trenton Gammon advised observation. By the time I examined the patient patient has become alert awake oriented and patient's wife states patient is back to baseline. Patient has known history of left-sided hemiparesis from previous stroke but otherwise nonfocal.   ED Course: CT head shows subarachnoid bleed. Labs show acute renal failure and possible UTI.  Review of Systems: As per HPI, rest all negative.   Past Medical History:  Diagnosis Date  . CVA (cerebral infarction) 12/2013  . Hypertension   . Sleep difficulties   . Stroke [facility])     Past Surgical History:  Procedure Laterality Date  . ABDOMINAL SURGERY    . CATARACT EXTRACTION Left      reports that he has never smoked. He has never used smokeless tobacco. He reports that he does not drink alcohol or use drugs.  No Known Allergies  Family History  Problem Relation Age of Onset  . Hyperlipidemia Mother   . Hypertension Mother   . Hypertension Father     Prior to Admission medications   Medication Sig Start Date End Date Taking? Authorizing Provider  aspirin 81 MG tablet Take 81 mg by mouth daily.   Yes Historical Provider, MD  fexofenadine  (ALLEGRA) 30 MG tablet Take 30 mg by mouth 2 (two) times daily.   Yes Historical Provider, MD  hydrochlorothiazide (HYDRODIURIL) 25 MG tablet Take 1.5 tablets (37.5 mg total) by mouth daily. 12/26/13  Yes Ivan Anchors Love, PA-C  losartan (COZAAR) 100 MG tablet Take 100 mg by mouth daily. 11/30/13  Yes Historical Provider, MD  metoprolol (LOPRESSOR) 50 MG tablet Take 50 mg by mouth daily.  11/30/13  Yes Historical Provider, MD  Probiotic Product (PROBIOTIC DAILY PO) Take 1 capsule by mouth daily.    Yes Historical Provider, MD  ranitidine (ZANTAC) 150 MG tablet Take 150 mg by mouth 2 (two) times daily.   Yes Historical Provider, MD  simvastatin (ZOCOR) 40 MG tablet Take 40 mg by mouth daily. 11/30/13  Yes Historical Provider, MD  Travoprost, BAK Free, (TRAVATAN) 0.004 % SOLN ophthalmic solution Place 1 drop into both eyes at bedtime.   Yes Historical Provider, MD    Physical Exam: Vitals:   03/09/16 0000 03/09/16 0045 03/09/16 0049 03/09/16 0124  BP: 118/69 115/72  (!) 122/53  Pulse: 103 95  94  Resp: 25 24  20   Temp:   98.6 F (37 C) 97.6 F (36.4 C)  TempSrc:    Oral  SpO2: 94% 93%  94%  Weight:    57.4 kg (126 lb 9.6 oz)  Height:    5\' 3"  (1.6 m)      Constitutional: Moderately built and nourished. Vitals:   03/09/16  0000 03/09/16 0045 03/09/16 0049 03/09/16 0124  BP: 118/69 115/72  (!) 122/53  Pulse: 103 95  94  Resp: 25 24  20   Temp:   98.6 F (37 C) 97.6 F (36.4 C)  TempSrc:    Oral  SpO2: 94% 93%  94%  Weight:    57.4 kg (126 lb 9.6 oz)  Height:    5\' 3"  (1.6 m)   Eyes: Anicteric no pallor. ENMT: No discharge from the ears eyes nose or mouth. Neck: No mass or. No neck rigidity. Respiratory: No rhonchi or crepitations. Cardiovascular: S1-S2 no murmurs appreciated. Abdomen: Soft nontender bowel sounds present. No guarding or rigidity. Musculoskeletal: No edema. No joint effusion. Skin: No rash. Skin appears warm. Neurologic: Alert awake oriented to time place and  person. Left-sided hemiparesis from previous stroke. Psychiatric: Appears normal. Normal affect.   Labs on Admission: I have personally reviewed following labs and imaging studies  CBC:  Recent Labs Lab 03/08/16 2303  WBC 13.6*  NEUTROABS 10.4*  HGB 13.0  HCT 39.5  MCV 92.5  PLT 956   Basic Metabolic Panel:  Recent Labs Lab 03/08/16 2303  NA 137  K 3.9  CL 102  CO2 22  GLUCOSE 137*  BUN 34*  CREATININE 1.45*  CALCIUM 9.3   GFR: Estimated Creatinine Clearance: 31.6 mL/min (by C-G formula based on SCr of 1.45 mg/dL (H)). Liver Function Tests:  Recent Labs Lab 03/08/16 2303  AST 29  ALT 20  ALKPHOS 39  BILITOT 0.8  PROT 7.4  ALBUMIN 3.8   No results for input(s): LIPASE, AMYLASE in the last 168 hours. No results for input(s): AMMONIA in the last 168 hours. Coagulation Profile: No results for input(s): INR, PROTIME in the last 168 hours. Cardiac Enzymes: No results for input(s): CKTOTAL, CKMB, CKMBINDEX, TROPONINI in the last 168 hours. BNP (last 3 results) No results for input(s): PROBNP in the last 8760 hours. HbA1C: No results for input(s): HGBA1C in the last 72 hours. CBG:  Recent Labs Lab 03/08/16 2114  GLUCAP 127*   Lipid Profile: No results for input(s): CHOL, HDL, LDLCALC, TRIG, CHOLHDL, LDLDIRECT in the last 72 hours. Thyroid Function Tests: No results for input(s): TSH, T4TOTAL, FREET4, T3FREE, THYROIDAB in the last 72 hours. Anemia Panel: No results for input(s): VITAMINB12, FOLATE, FERRITIN, TIBC, IRON, RETICCTPCT in the last 72 hours. Urine analysis:    Component Value Date/Time   COLORURINE YELLOW 03/09/2016 0158   APPEARANCEUR HAZY (A) 03/09/2016 0158   LABSPEC 1.015 03/09/2016 0158   PHURINE 5.0 03/09/2016 0158   GLUCOSEU NEGATIVE 03/09/2016 0158   HGBUR NEGATIVE 03/09/2016 0158   BILIRUBINUR NEGATIVE 03/09/2016 0158   KETONESUR NEGATIVE 03/09/2016 0158   PROTEINUR 30 (A) 03/09/2016 0158   UROBILINOGEN 0.2 12/16/2013 0929    NITRITE NEGATIVE 03/09/2016 0158   LEUKOCYTESUR MODERATE (A) 03/09/2016 0158   Sepsis Labs: @LABRCNTIP (procalcitonin:4,lacticidven:4) )No results found for this or any previous visit (from the past 240 hour(s)).   Radiological Exams on Admission: Dg Chest 2 View  Result Date: 03/08/2016 CLINICAL DATA:  Cough EXAM: CHEST  2 VIEW COMPARISON:  Chest radiograph 12/16/2013 FINDINGS: Previously seen central venous catheter is been removed. The cardiomediastinal silhouette is unchanged. There is no focal airspace consolidation or pulmonary edema. No pneumothorax or pleural effusion. Lung inflation shallow. Bibasilar atelectasis. IMPRESSION: Shallow lung inflation and bibasilar atelectasis. No focal airspace consolidation or pulmonary edema. Electronically Signed   By: Ulyses Jarred M.D.   On: 03/08/2016 22:51   Ct Head  Wo Contrast  Result Date: 03/08/2016 CLINICAL DATA:  Fall with abrasion to the left eyelid. EXAM: CT HEAD WITHOUT CONTRAST TECHNIQUE: Contiguous axial images were obtained from the base of the skull through the vertex without intravenous contrast. COMPARISON:  Brain MRI 12/17/2013 FINDINGS: Brain: There is a small focus of hemorrhage along the medial right temporal lobe, measuring approximately 14 x 9 x 7 mm. There is no other evidence of acute hemorrhage. There is no midline shift or mass effect. There is right occipital encephalomalacia at the site of prior hemorrhagic event. There is no evidence of acute cortical infarct. There is periventricular hypoattenuation compatible with chronic microvascular disease. Vascular: Atherosclerotic calcification of the vertebral and internal carotid arteries at the skull base. Skull: Normal. Negative for fracture or focal lesion. Sinuses/Orbits: There is a small left frontal sinus osteoma. No paranasal sinus fluid levels or advanced mucosal thickening. No mastoid effusion. Bilateral lens replacements. Other: None. IMPRESSION: 1. Small focus of hemorrhage  along the medial right temporal lobe, favored to be subarachnoid, however an intraparenchymal location would be difficult to exclude. The hematoma measures approximately 14 x 9 x 7 mm without associated mass effect. 2. Right parietal encephalomalacia at the site of prior hemorrhagic insult. Critical Value/emergent results were called by telephone at the time of interpretation on 03/08/2016 at 11:02 pm to Dr. Dayna Barker, who verbally acknowledged these results. Electronically Signed   By: Ulyses Jarred M.D.   On: 03/08/2016 23:03    EKG: Independently reviewed. Normal sinus rhythm. QTC of 450 ms.  Assessment/Plan Principal Problem:   Subarachnoid bleed (HCC) Active Problems:   Encephalopathy acute   Hypertension   History of stroke   Sleep apnea   SAH (subarachnoid hemorrhage) ([facility])    1. Subarachnoid bleed status post fall - as per Dr. Trenton Gammon on call neurosurgery patient is to be observed overnight. Please discuss with Dr. Trenton Gammon in a.m. for further recommendations. Hold aspirin. 2. Acute encephalopathy - most likely postconcussion. We'll also check EEG. Patient does have possible UTI which could also be contributing. By the time I examined patient is back to baseline. 3. Acute renal failure - not sure of the cause. At this time we will hold off hydrochlorothiazide ARB and gently hydrate. Follow metabolic panel. 4. Hypertension - since patient has acute renal failure holding off HCTZ and ARB. I have placed patient on when necessary IV hydralazine for systolic blood pressure more than 140. Continue metoprolol. 5. History of stroke - holding off aspirin due to subarachnoid hemorrhage. Continue statins. 6. Sleep apnea on CPAP.   DVT prophylaxis: SCDs. Code Status: Full code.  Family Communication: Patient's wife.  Disposition Plan: Home.  Consults called: ER physician discussed with neurosurgery.  Admission status: Observation.    Rise Patience MD [facility] Pager [phone].  If 7PM-7AM, please contact night-coverage [email or web address] Password [email or web address]  03/09/2016, 4:15 AM

## 2016-03-09 NOTE — Progress Notes (Signed)
EEG Completed; Results Pending  

## 2016-03-09 NOTE — Progress Notes (Signed)
  Echocardiogram 2D Echocardiogram with Definity has been performed.  Diamond Nickel 03/09/2016, 12:39 PM

## 2016-03-09 NOTE — Progress Notes (Signed)
Rn response to alarm and call light per pt request to use toilet. Upon entering room, pt noted attempting to get out of bed. At this time, call light and urinal within reach, bed alarm activated, yellow sock on. Pt with standby assist to bathroom and request to be alone. Per pt, he slid to floor while pulling up his underpants. Family at bedside. VSS. Pt denied any pain and request to leave him alone so he can wash up. Staff reiterate the importance of assistance due to his recent fall. Attending MD paged and notified. Will continue to monitor.   Ave Filter, RN

## 2016-03-09 NOTE — Evaluation (Signed)
Physical Therapy Evaluation Patient Details Name: Brian Wolf MRN: [id number] DOB: 1931/06/08 Today's Date: 03/09/2016   History of Present Illness  Pt is 83 y.o. male with history of stroke with left-sided hemiparesis, sleep apnea, hypertension was brought to the ER s/p unwitnessed fall while patient was in his room. Patient does not recall how he fell. In the ER patient had CT head which showed subarachnoid bleed.  Clinical Impression  Pt may benefit from OPPT to fully return to prior level of function. Pt however, declines follow up services as he is already having resolution of symptoms and he walks regularly at home for exercise. Will continue to follow patient while on this venue of care to progress mobility. Therapy will continue to follow to assist with discharge planning and follow up recommendations.    Follow Up Recommendations No PT follow up;Outpatient PT (pt does not want f/u therapies although OPPT recomended)    Equipment Recommendations  None recommended by PT    Recommendations for Other Services       Precautions / Restrictions Precautions Precautions: Fall Precaution Comments: prior fall at home Restrictions Weight Bearing Restrictions: No      Mobility  Bed Mobility Overal bed mobility: Modified Independent             General bed mobility comments: raised HOB and use of rail supine to sit, independent sit to supine  Transfers Overall transfer level: Needs assistance Equipment used: None Transfers: Sit to/from Stand Sit to Stand: Supervision         General transfer comment: good safety, supervision secondary to recent fall  Ambulation/Gait Ambulation/Gait assistance: Supervision Ambulation Distance (Feet): 150 Feet Assistive device: None Gait Pattern/deviations: Step-through pattern;Decreased step length - right;Decreased step length - left (decr trunk rot and decr environmental scanning) Gait velocity: 22' in 14.08 sec =1.56  ft/sec Gait velocity interpretation: <1.8 ft/sec, indicative of risk for recurrent falls    Stairs Stairs: Yes Stairs assistance: Min guard Stair Management: Two rails;Step to pattern;Forwards Number of Stairs: 11 General stair comments: reciprocating pattern while ascending, step to pattern while descend with Rt leg lead  Wheelchair Mobility    Modified Rankin (Stroke Patients Only) Modified Rankin (Stroke Patients Only) Pre-Morbid Rankin Score: No symptoms Modified Rankin: No significant disability     Balance Overall balance assessment: History of Falls;Needs assistance Sitting-balance support: No upper extremity supported;Feet supported Sitting balance-[name]: Good Sitting balance - Comments: able to manipulate socks in sitting   Standing balance support: No upper extremity supported Standing balance-[name]: Fair                               Pertinent Vitals/Pain Pain Assessment: No/denies pain    Home Living Family/patient expects to be discharged to:: Private residence Living Arrangements: Spouse/significant other Available Help at Discharge: Family;Available 24 hours/day Type of Home: House Home Access: Stairs to enter Entrance Stairs-Rails: Can reach both Entrance Stairs-Number of Steps: 10 Home Layout: One level Home Equipment: None      Prior Function Level of Independence: Independent               Hand Dominance   Dominant Hand: Right    Extremity/Trunk Assessment   Upper Extremity Assessment Upper Extremity Assessment: Overall WFL for tasks assessed (4+/5 bil shoulder flexion/abduct, elbow flexion)    Lower Extremity Assessment Lower Extremity Assessment: Overall WFL for tasks assessed (4+/5 bil hip flexion)    Cervical /  Trunk Assessment Cervical / Trunk Assessment: Kyphotic  Communication   Communication: No difficulties  Cognition Arousal/Alertness: Awake/alert Behavior During Therapy: WFL for tasks  assessed/performed Overall Cognitive Status: Within Functional Limits for tasks assessed                      General Comments General comments (skin integrity, edema, etc.): post exercise HR 88 bpm    Exercises     Assessment/Plan    PT Assessment Patient needs continued PT services  PT Problem List Decreased activity tolerance;Decreased balance;Decreased mobility          PT Treatment Interventions      PT Goals (Current goals can be found in the Care Plan section)  Acute Rehab PT Goals Patient Stated Goal: return home PT Goal Formulation: With patient Time For Goal Achievement: 03/13/16 Potential to Achieve Goals: Good    Frequency Min 4X/week   Barriers to discharge        Co-evaluation               End of Session Equipment Utilized During Treatment: Gait belt Activity Tolerance: Patient tolerated treatment well Patient left: in bed;with nursing/sitter in room Nurse Communication: Mobility status    Functional Assessment Tool Used: gait, activity tolerance Functional Limitation: Mobility: Walking and moving around Mobility: Walking and Moving Around Current Status (H6073): At least 1 percent but less than 20 percent impaired, limited or restricted Mobility: Walking and Moving Around Goal Status [phone]): 0 percent impaired, limited or restricted    Time: 6948-5462 PT Time Calculation (min) (ACUTE ONLY): 19 min   Charges:   PT Evaluation $PT Eval Low Complexity: 1 Procedure     PT G Codes:   PT G-Codes **NOT FOR INPATIENT CLASS** Functional Assessment Tool Used: gait, activity tolerance Functional Limitation: Mobility: Walking and moving around Mobility: Walking and Moving Around Current Status (V0350): At least 1 percent but less than 20 percent impaired, limited or restricted Mobility: Walking and Moving Around Goal Status [phone]): 0 percent impaired, limited or restricted   Brian Wolf, [place]  Brian Wolf 03/09/2016,  10:15 AM

## 2016-03-09 NOTE — Progress Notes (Signed)
Patient refused CPAP for tonight. He stated he doesn't wear it often at home and that he did not bring his with him.  RT informed patient that we have CPAP units available and to have RN call if he decided he wants to wear one. RT will monitor as needed.

## 2016-03-09 NOTE — Procedures (Signed)
ELECTROENCEPHALOGRAM REPORT  Date of Study: 03/09/2016  Patient's Name: Brian Wolf MRN: [id number] Date of Birth: [birth year]  Referring Provider: Rise Patience, MD  Clinical History: 83 year old man with prior right hemispheric stroke who presents status post unwitnessed fall.  Medications: acetaminophen (TYLENOL) cefTRIAXone (ROCEPHIN) 1 g i  famotidine (PEPCID) tablet 20 mg  hydrALAZINE (APRESOLINE) injection 10 mg  lloratadine (CLARITIN) tablet 10 mg  metoprolol (LOPRESSOR) tablet 50 mg  ondansetron (ZOFRAN)  simvastatin (ZOCOR) tablet 40 mg  Technical Summary: A multichannel digital EEG recording measured by the international 10-20 system with electrodes applied with paste and impedances below 5000 ohms performed in our laboratory with EKG monitoring in an awake and drowsy patient.  Hyperventilation and photic stimulation were not performed.  The digital EEG was referentially recorded, reformatted, and digitally filtered in a variety of bipolar and referential montages for optimal display.    Description: The patient is awake and drowsy during the recording.  During maximal wakefulness, there is a symmetric, medium voltage 8-9 Hz posterior dominant rhythm that attenuates with eye opening.  The record is symmetric.  During drowsiness, there is an increase in theta slowing of the background.  Vertex waves and symmetric sleep spindles were not seen.  There were no epileptiform discharges or electrographic seizures seen.    EKG lead was unremarkable.  Impression: This awake and drowsy EEG is normal.    Clinical Correlation: A normal EEG does not exclude a clinical diagnosis of epilepsy.  If further clinical questions remain, prolonged EEG may be helpful.  Clinical correlation is advised.   Metta Clines, DO

## 2016-03-10 ENCOUNTER — Inpatient Hospital Stay (HOSPITAL_COMMUNITY): Payer: Medicare Other

## 2016-03-10 LAB — CBC WITH DIFFERENTIAL/PLATELET
BASOS ABS: 0 10*3/uL (ref 0.0–0.1)
BASOS PCT: 0 %
EOS ABS: 0.2 10*3/uL (ref 0.0–0.7)
EOS PCT: 2 %
HCT: 37.8 % — ABNORMAL LOW (ref 39.0–52.0)
Hemoglobin: 12.1 g/dL — ABNORMAL LOW (ref 13.0–17.0)
Lymphocytes Relative: 26 %
Lymphs Abs: 2.8 10*3/uL (ref 0.7–4.0)
MCH: 29.8 pg (ref 26.0–34.0)
MCHC: 32 g/dL (ref 30.0–36.0)
MCV: 93.1 fL (ref 78.0–100.0)
MONO ABS: 1.1 10*3/uL — AB (ref 0.1–1.0)
Monocytes Relative: 11 %
NEUTROS ABS: 6.5 10*3/uL (ref 1.7–7.7)
Neutrophils Relative %: 61 %
PLATELETS: 229 10*3/uL (ref 150–400)
RBC: 4.06 MIL/uL — ABNORMAL LOW (ref 4.22–5.81)
RDW: 13.8 % (ref 11.5–15.5)
WBC: 10.7 10*3/uL — ABNORMAL HIGH (ref 4.0–10.5)

## 2016-03-10 LAB — BASIC METABOLIC PANEL
ANION GAP: 12 (ref 5–15)
BUN: 37 mg/dL — ABNORMAL HIGH (ref 6–20)
CALCIUM: 8.6 mg/dL — AB (ref 8.9–10.3)
CO2: 24 mmol/L (ref 22–32)
CREATININE: 1.32 mg/dL — AB (ref 0.61–1.24)
Chloride: 101 mmol/L (ref 101–111)
GFR, EST AFRICAN AMERICAN: 56 mL/min — AB (ref >60)
GFR, EST NON AFRICAN AMERICAN: 49 mL/min — AB (ref >60)
Glucose, Bld: 104 mg/dL — ABNORMAL HIGH (ref 65–99)
Potassium: 3.7 mmol/L (ref 3.5–5.1)
Sodium: 137 mmol/L (ref 135–145)

## 2016-03-10 NOTE — Progress Notes (Signed)
Physical Therapy Treatment Patient Details Name: Brian Wolf MRN: [id number] DOB: 26-May-1931 Today's Date: 03/10/2016    History of Present Illness Pt is 83 y.o. male with history of stroke with left-sided hemiparesis, sleep apnea, hypertension was brought to the ER s/p unwitnessed fall while patient was in his room. Patient does not recall how he fell. In the ER patient had CT head which showed subarachnoid bleed.    PT Comments    Pt ambulates with guarded gait with decreased trunk rotation.  He would benefit from outpatient PT, but is not interested.    Follow Up Recommendations  No PT follow up;Outpatient PT;Other (comment) (pt does not want follow up PT, although pt would benefit from OPPT)     Equipment Recommendations  None recommended by PT    Recommendations for Other Services       Precautions / Restrictions Precautions Precautions: Fall Restrictions Weight Bearing Restrictions: No    Mobility  Bed Mobility Overal bed mobility: Modified Independent                Transfers Overall transfer level: Needs assistance Equipment used: None Transfers: Sit to/from Stand Sit to Stand: Supervision         General transfer comment: S  Ambulation/Gait Ambulation/Gait assistance: Supervision Ambulation Distance (Feet): 250 Feet Assistive device: None Gait Pattern/deviations: Step-through pattern     General Gait Details: Pt with guarded type gait with decreased trunk rotation.  Incorporated aspects of dynamic gait index and when cued to look side to side he said he was, but very minimal movement in head.   Stairs Stairs: Yes   Stair Management: Two rails;Alternating pattern;Step to pattern;Forwards Number of Stairs: 11 General stair comments: alternating with ascent and S  and step to pattern with descent with min/guard  Wheelchair Mobility    Modified Rankin (Stroke Patients Only) Modified Rankin (Stroke Patients Only) Pre-Morbid Rankin  Score: No symptoms Modified Rankin: No significant disability     Balance Overall balance assessment: History of Falls;Needs assistance         Standing balance support: No upper extremity supported Standing balance-[name]: Fair                      Cognition Arousal/Alertness: Awake/alert Behavior During Therapy: WFL for tasks assessed/performed Overall Cognitive Status: Within Functional Limits for tasks assessed                      Exercises      General Comments        Pertinent Vitals/Pain Pain Assessment: No/denies pain    Home Living                      Prior Function            PT Goals (current goals can now be found in the care plan section) Acute Rehab PT Goals Patient Stated Goal: return home PT Goal Formulation: With patient Time For Goal Achievement: 03/13/16 Potential to Achieve Goals: Good    Frequency    Min 4X/week      PT Plan Current plan remains appropriate    Co-evaluation             End of Session Equipment Utilized During Treatment: Gait belt Activity Tolerance: Patient tolerated treatment well Patient left: in chair;with call bell/phone within reach;with chair alarm set     Time: 7989-2119 PT Time Calculation (min) (ACUTE ONLY): 19 min  Charges:  $Gait Training: 8-22 mins                    G Codes:      Hershey Knauer LUBECK 03/10/2016, 1:05 PM

## 2016-03-10 NOTE — Progress Notes (Signed)
PROGRESS NOTE    Brian Wolf  QQV:[id number] DOB: 04/13/1931 DOA: 03/08/2016 PCP: Simona Huh, MD    Brief Narrative:  This is an 83 year old male who has history of CVA with left-sided hemiparesis. He presents after sustaining a fall. The fall was unwitnessed, patient was found down, apparently her wife found him unconscious immediately after the fall, he recovered his consciousness slowly, was noted to be confused. CT head showed subarachnoid bleed. Neurosurgery contacted the recommendations for admission observation.  Assessment & Plan:   Principal Problem:   Subarachnoid bleed (HCC) Active Problems:   Encephalopathy acute   Essential hypertension   History of stroke   Sleep apnea   SAH (subarachnoid hemorrhage) ([place])  1. Medial right temporal lobe subarachnoid bleed, hematoma measuring 1497 mm without associated mass effect. Patient has been admitted to the medical floor, continue neuro checks, avoid antiplatelets or anticoagulants. Dr. Trenton Gammon on call neurosurgery was consulted and Dr. Trenton Gammon advised observation. Echocardiogram with normal LV function. Repeat MRI of the brain to rule out acute CVA, patient had an unwitnessed fall with previous history of strokes. Hold antiplatelet therapy for now, but may need to resume in the next 1-2 weeks. No evidence of cardiac arrhythmias.  2. Acute encephalopathy. EEG within normal limits. Mental status back to baseline.  3. Acute renal failure. Holding on diuretics and ARB. Gentle hydration with IV fluids.  4. Hypertension. Currently holding antihypertensive agents. Use hydralazine as needed.    DVT prophylaxis: scd  Code Status: Full Family Communication: No family at the bedside  Disposition Plan: home 2/8. PT OT evaluation. MRI. May need to reconsult neurosurgery today   Consultants:   Neurology   Procedures:    Antimicrobials:       Subjective: Patient feeling well, does not recall the detail events of what  happen last night. No tongue biting, unclear loss of consciousness.   Objective: Vitals:   03/09/16 2045 03/10/16 0100 03/10/16 0500 03/10/16 0921  BP: (!) 145/59 (!) 124/39 (!) 138/55 (!) 100/42  Pulse: 66 73 68 72  Resp: 16 17 17 18   Temp: 98.7 F (37.1 C) 98.9 F (37.2 C) 98.5 F (36.9 C) 97.7 F (36.5 C)  TempSrc: Oral Oral Oral Oral  SpO2: 99% 99% 99% 98%  Weight:      Height:        Intake/Output Summary (Last 24 hours) at 03/10/16 0953 Last data filed at 03/09/16 1749  Gross per 24 hour  Intake              840 ml  Output                0 ml  Net              840 ml   Filed Weights   03/09/16 0124  Weight: 57.4 kg (126 lb 9.6 oz)    Examination:  General exam: Patient not in pain or dyspnea E ENT. No pallor or icterus Respiratory system: Clear to auscultation. Respiratory effort normal. Cardiovascular system: S1 & S2 heard, RRR. No JVD, murmurs, rubs, gallops or clicks. No pedal edema. Gastrointestinal system: Abdomen is nondistended, soft and nontender. No organomegaly or masses felt. Normal bowel sounds heard. Central nervous system: Alert and oriented. No focal neurological deficits. Extremities: Symmetric 5 x 5 power. Skin: No rashes, lesions or ulcers. Mild echymmosis on the right face.    Data Reviewed: I have personally reviewed following labs and imaging studies  CBC:  Recent Labs Lab  03/08/16 2303 03/09/16 0512 03/10/16 0424  WBC 13.6* 11.5* 10.7*  NEUTROABS 10.4* 7.6 6.5  HGB 13.0 12.1* 12.1*  HCT 39.5 37.1* 37.8*  MCV 92.5 92.5 93.1  PLT 244 232 502   Basic Metabolic Panel:  Recent Labs Lab 03/08/16 2303 03/09/16 0512 03/10/16 0424  NA 137 139 137  K 3.9 3.8 3.7  CL 102 102 101  CO2 22 26 24   GLUCOSE 137* 132* 104*  BUN 34* 33* 37*  CREATININE 1.45* 1.42* 1.32*  CALCIUM 9.3 9.0 8.6*   GFR: Estimated Creatinine Clearance: 34.7 mL/min (by C-G formula based on SCr of 1.32 mg/dL (H)). Liver Function Tests:  Recent Labs Lab  03/08/16 2303 03/09/16 0512  AST 29 26  ALT 20 19  ALKPHOS 39 38  BILITOT 0.8 0.7  PROT 7.4 7.0  ALBUMIN 3.8 3.4*   No results for input(s): LIPASE, AMYLASE in the last 168 hours. No results for input(s): AMMONIA in the last 168 hours. Coagulation Profile: No results for input(s): INR, PROTIME in the last 168 hours. Cardiac Enzymes: No results for input(s): CKTOTAL, CKMB, CKMBINDEX, TROPONINI in the last 168 hours. BNP (last 3 results) No results for input(s): PROBNP in the last 8760 hours. HbA1C: No results for input(s): HGBA1C in the last 72 hours. CBG:  Recent Labs Lab 03/08/16 2114  GLUCAP 127*   Lipid Profile: No results for input(s): CHOL, HDL, LDLCALC, TRIG, CHOLHDL, LDLDIRECT in the last 72 hours. Thyroid Function Tests: No results for input(s): TSH, T4TOTAL, FREET4, T3FREE, THYROIDAB in the last 72 hours. Anemia Panel: No results for input(s): VITAMINB12, FOLATE, FERRITIN, TIBC, IRON, RETICCTPCT in the last 72 hours. Sepsis Labs: No results for input(s): PROCALCITON, LATICACIDVEN in the last 168 hours.  No results found for this or any previous visit (from the past 240 hour(s)).       Radiology Studies: Dg Chest 2 View  Result Date: 03/08/2016 CLINICAL DATA:  Cough EXAM: CHEST  2 VIEW COMPARISON:  Chest radiograph 12/16/2013 FINDINGS: Previously seen central venous catheter is been removed. The cardiomediastinal silhouette is unchanged. There is no focal airspace consolidation or pulmonary edema. No pneumothorax or pleural effusion. Lung inflation shallow. Bibasilar atelectasis. IMPRESSION: Shallow lung inflation and bibasilar atelectasis. No focal airspace consolidation or pulmonary edema. Electronically Signed   By: Ulyses Jarred M.D.   On: 03/08/2016 22:51   Ct Head Wo Contrast  Result Date: 03/08/2016 CLINICAL DATA:  Fall with abrasion to the left eyelid. EXAM: CT HEAD WITHOUT CONTRAST TECHNIQUE: Contiguous axial images were obtained from the base of the  skull through the vertex without intravenous contrast. COMPARISON:  Brain MRI 12/17/2013 FINDINGS: Brain: There is a small focus of hemorrhage along the medial right temporal lobe, measuring approximately 14 x 9 x 7 mm. There is no other evidence of acute hemorrhage. There is no midline shift or mass effect. There is right occipital encephalomalacia at the site of prior hemorrhagic event. There is no evidence of acute cortical infarct. There is periventricular hypoattenuation compatible with chronic microvascular disease. Vascular: Atherosclerotic calcification of the vertebral and internal carotid arteries at the skull base. Skull: Normal. Negative for fracture or focal lesion. Sinuses/Orbits: There is a small left frontal sinus osteoma. No paranasal sinus fluid levels or advanced mucosal thickening. No mastoid effusion. Bilateral lens replacements. Other: None. IMPRESSION: 1. Small focus of hemorrhage along the medial right temporal lobe, favored to be subarachnoid, however an intraparenchymal location would be difficult to exclude. The hematoma measures approximately 14 x 9  x 7 mm without associated mass effect. 2. Right parietal encephalomalacia at the site of prior hemorrhagic insult. Critical Value/emergent results were called by telephone at the time of interpretation on 03/08/2016 at 11:02 pm to Dr. Dayna Barker, who verbally acknowledged these results. Electronically Signed   By: Ulyses Jarred M.D.   On: 03/08/2016 23:03        Scheduled Meds: . cefTRIAXone (ROCEPHIN)  IV  1 g Intravenous Daily  . famotidine  20 mg Oral BID  . latanoprost  1 drop Both Eyes QHS  . loratadine  10 mg Oral Daily  . metoprolol  50 mg Oral Daily  . simvastatin  40 mg Oral Daily   Continuous Infusions:    LOS: 1 day        Reyne Dumas, MD [facility] Pager [phone]  If 7PM-7AM, please contact night-coverage [email or web address] Password [facility] 03/10/2016, 9:53 AM

## 2016-03-11 ENCOUNTER — Inpatient Hospital Stay (HOSPITAL_COMMUNITY): Payer: Medicare Other

## 2016-03-11 DIAGNOSIS — I609 Nontraumatic subarachnoid hemorrhage, unspecified: Secondary | ICD-10-CM

## 2016-03-11 DIAGNOSIS — W19XXXA Unspecified fall, initial encounter: Secondary | ICD-10-CM

## 2016-03-11 LAB — URINE CULTURE

## 2016-03-11 MED ORDER — LOSARTAN POTASSIUM 100 MG PO TABS: 100.0000 mg | ORAL_TABLET | Freq: Every day | ORAL | 0 refills | Status: DC

## 2016-03-11 MED ORDER — ASPIRIN EC 81 MG PO TBEC
81.0000 mg | DELAYED_RELEASE_TABLET | Freq: Every day | ORAL | Status: DC
  Administered 2016-03-12: 81 mg via ORAL
  Filled 2016-03-11 (×2): qty 1

## 2016-03-11 MED ORDER — CIPROFLOXACIN HCL 500 MG PO TABS: 500.0000 mg | ORAL_TABLET | Freq: Two times a day (BID) | ORAL | 0 refills | Status: AC

## 2016-03-11 NOTE — Progress Notes (Addendum)
Pt. Is reporting increase pain under left arm. Tylenol 650 mg administered, will reassess in an hr. DR Allyson Sabal notified

## 2016-03-11 NOTE — Evaluation (Signed)
Occupational Therapy Evaluation Patient Details Name: Brian Wolf MRN: [id number] DOB: 03/01/1931 Today's Date: 03/11/2016    History of Present Illness Pt is 83 y.o. male with history of stroke with left-sided hemiparesis, sleep apnea, hypertension was brought to the ER s/p unwitnessed fall while patient was in his room. Patient does not recall how he fell. In the ER patient had CT head which showed subarachnoid bleed.   Clinical Impression   Pt admitted with the above diagnoses and presents with below problem list. Pt will benefit from continued acute OT to address the below listed deficits and maximize independence with basic ADLs prior to d/c home. PTA pt was independent with ADLs. Pt is currently supervision to min guard with ADLs. Pt reporting sharp, sudden pain in anterior-lateral L chest with cough and with activation of musculature in that area. Nursing notified.       Follow Up Recommendations  No OT follow up    Equipment Recommendations  None recommended by OT    Recommendations for Other Services       Precautions / Restrictions Precautions Precautions: Fall Precaution Comments: prior fall at home Restrictions Weight Bearing Restrictions: No      Mobility Bed Mobility               General bed mobility comments: up in chair  Transfers Overall transfer level: Needs assistance Equipment used: None Transfers: Sit to/from Stand Sit to Stand: Supervision              Balance Overall balance assessment: History of Falls;Needs assistance         Standing balance support: No upper extremity supported;Single extremity supported Standing balance-[name]: Fair Standing balance comment: stood at sink to complete oral care, intermittent external support of sink, wide BOS                            ADL Overall ADL's : Needs assistance/impaired Eating/Feeding: Set up;Sitting   Grooming: Supervision/safety;Standing;Oral  care Grooming Details (indicate cue type and reason): sink for intermittent external support Upper Body Bathing: Set up;Sitting   Lower Body Bathing: Min guard;Sit to/from stand   Upper Body Dressing : Set up;Sitting   Lower Body Dressing: Min guard;Sit to/from stand   Toilet Transfer: Supervision/safety;Ambulation;Regular Museum/gallery exhibitions officer and Hygiene: Min guard;Sit to/from stand   Tub/ Shower Transfer: Supervision/safety;Min guard;Walk-in shower;Ambulation   Functional mobility during ADLs: Supervision/safety General ADL Comments: Pt completed oral care standing at sink, walk-in shower transfer, and in-room functional mobility. Spouse present during session. Wide BOS noted but pt and spouse report this is baseline. Educated on general fall prevention tips.      Vision     Perception     Praxis      Pertinent Vitals/Pain Pain Assessment: Faces Faces Pain Scale: Hurts whole lot Pain Location: L anterior-lateral chest with coughing and activiation of musculature (pushing up from armrest to stand). Pain Descriptors / Indicators: Sharp Pain Intervention(s): Limited activity within patient's tolerance;Monitored during session;Other (comment) (educated on holding pillow when coughing)     Hand Dominance Right   Extremity/Trunk Assessment Upper Extremity Assessment Upper Extremity Assessment: Overall WFL for tasks assessed (4+/5 bil shoulder flexion/abduct, elbow flexion)   Lower Extremity Assessment Lower Extremity Assessment: Defer to PT evaluation   Cervical / Trunk Assessment Cervical / Trunk Assessment: Kyphotic   Communication Communication Communication: No difficulties   Cognition Arousal/Alertness: Awake/alert Behavior During Therapy: [employer] [facility] [employer]  for tasks assessed/performed Overall Cognitive Status: Within Functional Limits for tasks assessed                 General Comments: Pt and spouse report pt at/near baseline.    General  Comments       Exercises       Shoulder Instructions      Home Living Family/patient expects to be discharged to:: Private residence Living Arrangements: Spouse/significant other Available Help at Discharge: Family;Available 24 hours/day Type of Home: House Home Access: Stairs to enter [employer] of Steps: 10 Entrance Stairs-Rails: Can reach both Home Layout: One level     Bathroom Shower/Tub: Walk-in shower;Tub/shower unit   [employer]: Standard     Home Equipment: Shower seat;Grab bars - tub/shower          Prior Functioning/Environment Level of Independence: Independent        Comments: does not drive        OT Problem List: Impaired balance (sitting and/or standing);Decreased knowledge of use of DME or AE;Decreased knowledge of precautions;Pain   OT Treatment/Interventions: Self-care/ADL training;DME and/or AE instruction;Therapeutic activities;Patient/family education;Balance training    OT Goals(Current goals can be found in the care plan section) Acute Rehab OT Goals Patient Stated Goal: return home OT Goal Formulation: With patient/family Time For Goal Achievement: 03/18/16 Potential to Achieve Goals: Good ADL Goals Pt Will Perform Grooming: Independently;standing (including gathering items) Pt Will Perform Lower Body Bathing: Independently;sit to/from stand Pt Will Perform Lower Body Dressing: Independently;sit to/from stand  OT Frequency: Min 1X/week   Barriers to D/C:            Co-evaluation              End of Session Nurse Communication: Other (comment) (pain on left side of chest)  Activity Tolerance: Patient tolerated treatment well Patient left: in chair;with call bell/phone within reach;with chair alarm set;with family/visitor present   Time: 6701-4103 OT Time Calculation (min): 20 min Charges:  OT General Charges $OT Visit: 1 Procedure OT Evaluation $OT Eval Low Complexity: 1 Procedure G-Codes:     Hortencia Pilar 04/06/16, 10:01 AM

## 2016-03-11 NOTE — Consult Note (Signed)
NEURO HOSPITALIST CONSULT NOTE   Requestig physician: Dr. Allyson Sabal, [facility]    Reason for Consult: Subarachnoid bleed, possible new infarcts on MRI   History obtained from:  Patient and Chart    HPI:                                                                                                                                          Brian Wolf is an 83 y.o. man with PMHx of prior temporal lobe hemorrhagic CVA in 2015, OSA, and HTN presenting after an unwitnessed fall at home. He was admitted on 03/08/16. Patient states he doesn't recall all of the details of the fall, but does not remember losing consciousness. He states he had been feeling dizzy while standing up that day. He reports falling and having trouble getting up off the floor. His wife heard him fall and was able to help him up. He denies any sudden onset weakness, changes in speech, changes in vision, difficulty swallowing, or numbness/tingling. On CT head in the ED he was found to have small medial right temporal lobe hemorrhage, likely subarachnoid hemorrhage. Neurosurgery had been consulted by the ED and recommended observation. An MRI brain was then performed which showed possibly 2 new infarcts in the hippocampal region.   Past Medical History:  Diagnosis Date  . CVA (cerebral infarction) 12/2013  . Hypertension   . Sleep difficulties   . Stroke [facility])     Past Surgical History:  Procedure Laterality Date  . ABDOMINAL SURGERY    . CATARACT EXTRACTION Left     Family History  Problem Relation Age of Onset  . Hyperlipidemia Mother   . Hypertension Mother   . Hypertension Father     Family History: Mother HTN, hyperlipidemia Father HTN  Social History:  reports that he has never smoked. He has never used smokeless tobacco. He reports that he does not drink alcohol or use drugs.  No Known Allergies  MEDICATIONS:                                                                                                                      I have reviewed the patient's current medications.   ROS:  History obtained from the patient  General ROS: negative for - chills, fatigue, fever, night sweats, weight gain or weight loss Psychological ROS: negative for - behavioral disorder, hallucinations, memory difficulties, mood swings or suicidal ideation Ophthalmic ROS: negative for - blurry vision, double vision, eye pain or loss of vision ENT ROS: negative for - epistaxis, nasal discharge, oral lesions, sore throat, tinnitus or vertigo Allergy and Immunology ROS: negative for - hives or itchy/watery eyes Hematological and Lymphatic ROS: negative for - bleeding problems, bruising or swollen lymph nodes Endocrine ROS: negative for - galactorrhea, hair pattern changes, polydipsia/polyuria or temperature intolerance Respiratory ROS: negative for - cough, hemoptysis, shortness of breath or wheezing Cardiovascular ROS: negative for - chest pain, dyspnea on exertion, edema or irregular heartbeat Gastrointestinal ROS: negative for - abdominal pain, diarrhea, hematemesis, nausea/vomiting or stool incontinence Genito-Urinary ROS: negative for - dysuria, hematuria, incontinence or urinary frequency/urgency Musculoskeletal ROS: negative for - joint swelling or muscular weakness Neurological ROS: as noted in HPI Dermatological ROS: negative for rash and skin lesion changes   Blood pressure (!) 108/59, pulse 76, temperature 98.3 F (36.8 C), temperature source Oral, resp. rate 16, height 5\' 3"  (1.6 m), weight 126 lb 9.6 oz (57.4 kg), SpO2 97 %.   Neurologic Examination:                                                                                                      HEENT-  [place], EOMI, sclera anicteric, mucus membranes moist Cardiovascular- RRR, no m/g/r  Lungs- CTA  bilaterally, breaths non-labored Abdomen- BS+, soft, non-tender Extremities- no peripheral edema, moves all extremities  Lymph-no adenopathy palpable Musculoskeletal-no joint tenderness, deformity or swelling, no muscular tenderness noted, full range of motion without pain Skin- Abrasion/bruising under right eye due to fall  Neurological Examination Mental Status: Alert, oriented, thought content appropriate.  Speech fluent without evidence of aphasia.  Able to follow 3 step commands without difficulty. Cranial Nerves: II: Visual fields grossly normal, PERRLA III,IV, VI: ptosis not present, extra-ocular motions intact bilaterally V,VII: smile symmetric, facial light touch sensation normal bilaterally VIII: hearing normal bilaterally IX,X: uvula rises symmetrically XI: bilateral shoulder shrug XII: midline tongue extension Motor: Right : Upper extremity   5/5    Left:     Upper extremity   5/5  Lower extremity   5/5     Lower extremity   5/5 Tone and bulk:normal tone throughout; no atrophy noted Sensory: Light touch intact throughout, bilaterally Deep Tendon Reflexes: 2+ and symmetric throughout Plantars: Right: downgoing   Left: downgoing Cerebellar: normal finger-to-nose and normal heel-to-shin test Gait: gait slow and cautious, but steady      Lab Results: Basic Metabolic Panel:  Recent Labs Lab 03/08/16 2303 03/09/16 0512 03/10/16 0424  NA 137 139 137  K 3.9 3.8 3.7  CL 102 102 101  CO2 22 26 24   GLUCOSE 137* 132* 104*  BUN 34* 33* 37*  CREATININE 1.45* 1.42* 1.32*  CALCIUM 9.3 9.0 8.6*    Liver Function Tests:  Recent Labs Lab 03/08/16 2303 03/09/16 0512  AST 29  26  ALT 20 19  ALKPHOS 39 38  BILITOT 0.8 0.7  PROT 7.4 7.0  ALBUMIN 3.8 3.4*    CBC:  Recent Labs Lab 03/08/16 2303 03/09/16 0512 03/10/16 0424  WBC 13.6* 11.5* 10.7*  NEUTROABS 10.4* 7.6 6.5  HGB 13.0 12.1* 12.1*  HCT 39.5 37.1* 37.8*  MCV 92.5 92.5 93.1  PLT 244 232 229     CBG:  Recent Labs Lab 03/08/16 2114  GLUCAP 127*    Microbiology: Results for orders placed or performed during the hospital encounter of 03/08/16  Culture, Urine     Status: Abnormal   Collection Time: 03/09/16  4:44 PM  Result Value Ref Range Status   Specimen Description URINE, RANDOM  Final   Special Requests NONE  Final   Culture >=100,000 COLONIES/mL ENTEROBACTER CLOACAE (A)  Final   Report Status 03/11/2016 FINAL  Final   Organism ID, Bacteria ENTEROBACTER CLOACAE (A)  Final      Susceptibility   Enterobacter cloacae - MIC*    CEFAZOLIN >=64 RESISTANT Resistant     CEFTRIAXONE <=1 SENSITIVE Sensitive     CIPROFLOXACIN <=0.25 SENSITIVE Sensitive     GENTAMICIN <=1 SENSITIVE Sensitive     IMIPENEM 1 SENSITIVE Sensitive     NITROFURANTOIN 32 SENSITIVE Sensitive     TRIMETH/SULFA <=20 SENSITIVE Sensitive     PIP/TAZO <=4 SENSITIVE Sensitive     * >=100,000 COLONIES/mL ENTEROBACTER CLOACAE     Imaging: Mr Brain 93 Contrast  Result Date: 03/10/2016 CLINICAL DATA:  83 year old male found down unconscious by wife. Right posterior hippocampal region intraparenchymal versus subarachnoid hemorrhage on noncontrast head CT. Initial encounter. EXAM: MRI HEAD WITHOUT CONTRAST TECHNIQUE: Multiplanar, multiecho pulse sequences of the brain and surrounding structures were obtained without intravenous contrast. COMPARISON:  Head CT without contrast 03/08/2016. Brain MRI 12/17/2013 and earlier. FINDINGS: Brain: Posterior right hemisphere encephalomalacia with interval expected evolution of the large parenchymal hemorrhage which occurred in 2015. Ex vacuo enlargement of the right occipital horn and posterior aspect of the right temporal horn. No residual regional mass effect. There is a small area also of hemosiderin along the posterior hippocampal formation seen on T2* imaging (series 8, image 8) and corresponding to the recent CT finding. This does appear to be intra-axial (series 10,  image 10), with associated diffusion signal heterogeneity which is probably susceptibility related (series 7, image 11) no definite surrounding edema and no regional mass effect. There appears to be atrophy of the tail of the right hippocampus (series 10, image 9). There is also a small 1-2 mm focus of cortical restricted diffusion at the right occipital pole (series 4, image 16). This is just medial to the area of encephalomalacia, with no significant T2 or FLAIR hyperintensity. Also, a similar focus of diffusion signal was present here in 2015 such that this is not suspected to be acute ischemia. No other convincing area of restricted diffusion. No midline shift, mass effect, evidence of mass lesion, ventriculomegaly, or extra-axial collection. Cervicomedullary junction within normal limits. Chronic partially empty sella. Outside of the above signal changes there is chronic widespread Patchy and confluent nonspecific cerebral white matter T2 and FLAIR hyperintensity. Chronic T2 heterogeneity in the deep gray matter nuclei is stable since 2015. Negative brainstem and cerebellum. Vascular: Major intracranial vascular flow voids are stable since 2015 with a degree of generalized intracranial artery dolichoectasia. Skull and upper cervical spine: Negative. Normal bone marrow signal. Sinuses/Orbits: Interval postoperative changes to the right globe. Otherwise negative orbits soft tissues.  Mild right ethmoid and maxillary sinus mucosal thickening. Small volume retained secretions in the nasopharynx. Interval improved right mastoid aeration. Other: Visible internal auditory structures appear normal. Negative scalp soft tissues. IMPRESSION: 1. Expected encephalomalacia in the area of the large posterior right hemisphere hemorrhage that occurred in 2015. 2. Superimposed signal abnormality at the right hippocampal formation corresponding to the recent CT finding appears to be a trace amount of intra-axial hemorrhage without  surrounding edema or mass effect. This could be the sequelae of a recent small hemorrhagic lacunar infarct, or alternatively could be chronic right hippocampal micohemorrhage. There is superimposed atrophy of the tail of the right hippocampus. 3. Underlying widespread signal changes in both hemispheres favored to reflect advanced chronic small vessel disease. Electronically Signed   By: Genevie Ann M.D.   On: 03/10/2016 12:47     Assessment/Plan:  Brian Wolf is a 83yo man with prior hemorrhagic stroke presenting after a fall at home.  Subarachnoid Hemorrhage with Hippocampal Abnormalities: MRI brain was reviewed with Dr. Alver Fisher and he will provide his interpretation. Appears as if the lesions noted in the hippocampal area and occipital area are likely chronic infarcts as they are seen on the old MRI in 2015. However, on the diffusion weighted images these lesions show restricted diffusion which would not be expected if they are chronic. Would like to perform a repeat CT scan tomorrow after restarting aspirin to ensure these areas do not represent an acute process. I have restarted his home ASA 81 mg daily.   Enterobacter cloacae UTI: Likely responsible for his recent dizziness. Continue Ceftriaxone as organism is sensitive to this.   Attending note to follow  Albin Felling, MD, MPH Internal Medicine Resident, PGY-III Pager: [phone]

## 2016-03-11 NOTE — Discharge Summary (Addendum)
Physician Discharge Summary  Staley Lunz MRN: [id number] DOB/AGE: [birth year] 83 y.o.  PCP: Simona Huh, MD   Admit date: 03/08/2016 Discharge date: 03/11/2016  Discharge Diagnoses:    Principal Problem:   Subarachnoid bleed [facility]) Active Problems:   Encephalopathy acute   Essential hypertension   History of stroke   Sleep apnea   SAH (subarachnoid hemorrhage) ([place]) Enterococcal UTI  Addendum Discharge is being placed on hold pending neurology recommendations Neurology to formally evaluate patient for ischemic stroke Patient's son who is a physician has been updated about the plan of care  Follow-up recommendations Follow-up with PCP in 3-5 days , including all  additional recommended appointments as below Follow-up CBC, CMP in 3-5 days Patient is followed by Antony Contras, MD, for intracerebral hemorrhage in NOV 2015 Patient has been recommended to follow-up with him following this admission to discuss, timing of resumption, need for continuation of antiplatelet treatment possible recurrent intracranial hemorrhage, Patient has been set up for outpatient physical therapy     Current Discharge Medication List    START taking these medications   Details  ciprofloxacin (CIPRO) 500 MG tablet Take 1 tablet (500 mg total) by mouth 2 (two) times daily. Qty: 14 tablet, Refills: 0      CONTINUE these medications which have CHANGED   Details  losartan (COZAAR) 100 MG tablet Take 1 tablet (100 mg total) by mouth daily. Qty: 30 tablet, Refills: 0      CONTINUE these medications which have NOT CHANGED   Details  fexofenadine (ALLEGRA) 30 MG tablet Take 30 mg by mouth 2 (two) times daily.    hydrochlorothiazide (HYDRODIURIL) 25 MG tablet Take 1.5 tablets (37.5 mg total) by mouth daily. Qty: 45 tablet, Refills: 1    metoprolol (LOPRESSOR) 50 MG tablet Take 50 mg by mouth daily.     Probiotic Product (PROBIOTIC DAILY PO) Take 1 capsule by mouth daily.     ranitidine  (ZANTAC) 150 MG tablet Take 150 mg by mouth 2 (two) times daily.    simvastatin (ZOCOR) 40 MG tablet Take 40 mg by mouth daily.    Travoprost, BAK Free, (TRAVATAN) 0.004 % SOLN ophthalmic solution Place 1 drop into both eyes at bedtime.      STOP taking these medications     aspirin 81 MG tablet          Discharge Condition: Stable  Discharge Instructions Get Medicines reviewed and adjusted: Please take all your medications with you for your next visit with your Primary MD  Please request your Primary MD to go over all hospital tests and procedure/radiological results at the follow up, please ask your Primary MD to get all Hospital records sent to his/her office.  If you experience worsening of your admission symptoms, develop shortness of breath, life threatening emergency, suicidal or homicidal thoughts you must seek medical attention immediately by calling 911 or calling your MD immediately if symptoms less severe.  You must read complete instructions/literature along with all the possible adverse reactions/side effects for all the Medicines you take and that have been prescribed to you. Take any new Medicines after you have completely understood and accpet all the possible adverse reactions/side effects.   Do not drive when taking Pain medications.   Do not take more than prescribed Pain, Sleep and Anxiety Medications  Special Instructions: If you have smoked or chewed Tobacco in the last 2 yrs please stop smoking, stop any regular Alcohol and or any Recreational drug use.  Wear Seat  belts while driving.  Please note  You were cared for by a hospitalist during your hospital stay. Once you are discharged, your primary care physician will handle any further medical issues. Please note that NO REFILLS for any discharge medications will be authorized once you are discharged, as it is imperative that you return to your primary care physician (or establish a relationship with a  primary care physician if you do not have one) for your aftercare needs so that they can reassess your need for medications and monitor your lab values.     No Known Allergies    Disposition: 06-Home-Health Care Svc   Consults:  Telephone consultation with neurology, Dr. Verita Lamb   Significant Diagnostic Studies:  Dg Chest 2 View  Result Date: 03/08/2016 CLINICAL DATA:  Cough EXAM: CHEST  2 VIEW COMPARISON:  Chest radiograph 12/16/2013 FINDINGS: Previously seen central venous catheter is been removed. The cardiomediastinal silhouette is unchanged. There is no focal airspace consolidation or pulmonary edema. No pneumothorax or pleural effusion. Lung inflation shallow. Bibasilar atelectasis. IMPRESSION: Shallow lung inflation and bibasilar atelectasis. No focal airspace consolidation or pulmonary edema. Electronically Signed   By: Ulyses Jarred M.D.   On: 03/08/2016 22:51   Ct Head Wo Contrast  Result Date: 03/08/2016 CLINICAL DATA:  Fall with abrasion to the left eyelid. EXAM: CT HEAD WITHOUT CONTRAST TECHNIQUE: Contiguous axial images were obtained from the base of the skull through the vertex without intravenous contrast. COMPARISON:  Brain MRI 12/17/2013 FINDINGS: Brain: There is a small focus of hemorrhage along the medial right temporal lobe, measuring approximately 14 x 9 x 7 mm. There is no other evidence of acute hemorrhage. There is no midline shift or mass effect. There is right occipital encephalomalacia at the site of prior hemorrhagic event. There is no evidence of acute cortical infarct. There is periventricular hypoattenuation compatible with chronic microvascular disease. Vascular: Atherosclerotic calcification of the vertebral and internal carotid arteries at the skull base. Skull: Normal. Negative for fracture or focal lesion. Sinuses/Orbits: There is a small left frontal sinus osteoma. No paranasal sinus fluid levels or advanced mucosal thickening. No mastoid effusion. Bilateral  lens replacements. Other: None. IMPRESSION: 1. Small focus of hemorrhage along the medial right temporal lobe, favored to be subarachnoid, however an intraparenchymal location would be difficult to exclude. The hematoma measures approximately 14 x 9 x 7 mm without associated mass effect. 2. Right parietal encephalomalacia at the site of prior hemorrhagic insult. Critical Value/emergent results were called by telephone at the time of interpretation on 03/08/2016 at 11:02 pm to Dr. Dayna Barker, who verbally acknowledged these results. Electronically Signed   By: Ulyses Jarred M.D.   On: 03/08/2016 23:03   Mr Brain Wo Contrast  Result Date: 03/10/2016 CLINICAL DATA:  83 year old male found down unconscious by wife. Right posterior hippocampal region intraparenchymal versus subarachnoid hemorrhage on noncontrast head CT. Initial encounter. EXAM: MRI HEAD WITHOUT CONTRAST TECHNIQUE: Multiplanar, multiecho pulse sequences of the brain and surrounding structures were obtained without intravenous contrast. COMPARISON:  Head CT without contrast 03/08/2016. Brain MRI 12/17/2013 and earlier. FINDINGS: Brain: Posterior right hemisphere encephalomalacia with interval expected evolution of the large parenchymal hemorrhage which occurred in 2015. Ex vacuo enlargement of the right occipital horn and posterior aspect of the right temporal horn. No residual regional mass effect. There is a small area also of hemosiderin along the posterior hippocampal formation seen on T2* imaging (series 8, image 8) and corresponding to the recent CT finding. This does appear to  be intra-axial (series 10, image 10), with associated diffusion signal heterogeneity which is probably susceptibility related (series 7, image 11) no definite surrounding edema and no regional mass effect. There appears to be atrophy of the tail of the right hippocampus (series 10, image 9). There is also a small 1-2 mm focus of cortical restricted diffusion at the right  occipital pole (series 4, image 16). This is just medial to the area of encephalomalacia, with no significant T2 or FLAIR hyperintensity. Also, a similar focus of diffusion signal was present here in 2015 such that this is not suspected to be acute ischemia. No other convincing area of restricted diffusion. No midline shift, mass effect, evidence of mass lesion, ventriculomegaly, or extra-axial collection. Cervicomedullary junction within normal limits. Chronic partially empty sella. Outside of the above signal changes there is chronic widespread Patchy and confluent nonspecific cerebral white matter T2 and FLAIR hyperintensity. Chronic T2 heterogeneity in the deep gray matter nuclei is stable since 2015. Negative brainstem and cerebellum. Vascular: Major intracranial vascular flow voids are stable since 2015 with a degree of generalized intracranial artery dolichoectasia. Skull and upper cervical spine: Negative. Normal bone marrow signal. Sinuses/Orbits: Interval postoperative changes to the right globe. Otherwise negative orbits soft tissues. Mild right ethmoid and maxillary sinus mucosal thickening. Small volume retained secretions in the nasopharynx. Interval improved right mastoid aeration. Other: Visible internal auditory structures appear normal. Negative scalp soft tissues. IMPRESSION: 1. Expected encephalomalacia in the area of the large posterior right hemisphere hemorrhage that occurred in 2015. 2. Superimposed signal abnormality at the right hippocampal formation corresponding to the recent CT finding appears to be a trace amount of intra-axial hemorrhage without surrounding edema or mass effect. This could be the sequelae of a recent small hemorrhagic lacunar infarct, or alternatively could be chronic right hippocampal micohemorrhage. There is superimposed atrophy of the tail of the right hippocampus. 3. Underlying widespread signal changes in both hemispheres favored to reflect advanced chronic small  vessel disease. Electronically Signed   By: Genevie Ann M.D.   On: 03/10/2016 12:47        Filed Weights   03/09/16 0124  Weight: 57.4 kg (126 lb 9.6 oz)     Microbiology: Recent Results (from the past 240 hour(s))  Culture, Urine     Status: Abnormal   Collection Time: 03/09/16  4:44 PM  Result Value Ref Range Status   Specimen Description URINE, RANDOM  Final   Special Requests NONE  Final   Culture >=100,000 COLONIES/mL ENTEROBACTER CLOACAE (A)  Final   Report Status 03/11/2016 FINAL  Final   Organism ID, Bacteria ENTEROBACTER CLOACAE (A)  Final      Susceptibility   Enterobacter cloacae - MIC*    CEFAZOLIN >=64 RESISTANT Resistant     CEFTRIAXONE <=1 SENSITIVE Sensitive     CIPROFLOXACIN <=0.25 SENSITIVE Sensitive     GENTAMICIN <=1 SENSITIVE Sensitive     IMIPENEM 1 SENSITIVE Sensitive     NITROFURANTOIN 32 SENSITIVE Sensitive     TRIMETH/SULFA <=20 SENSITIVE Sensitive     PIP/TAZO <=4 SENSITIVE Sensitive     * >=100,000 COLONIES/mL ENTEROBACTER CLOACAE       Blood Culture    Component Value Date/Time   SDES URINE, RANDOM 03/09/2016 1644   SPECREQUEST NONE 03/09/2016 1644   CULT >=100,000 COLONIES/mL ENTEROBACTER CLOACAE (A) 03/09/2016 1644   REPTSTATUS 03/11/2016 FINAL 03/09/2016 1644      Labs: Results for orders placed or performed during the hospital encounter of 03/08/16 (from  the past 48 hour(s))  Culture, Urine     Status: Abnormal   Collection Time: 03/09/16  4:44 PM  Result Value Ref Range   Specimen Description URINE, RANDOM    Special Requests NONE    Culture >=100,000 COLONIES/mL ENTEROBACTER CLOACAE (A)    Report Status 03/11/2016 FINAL    Organism ID, Bacteria ENTEROBACTER CLOACAE (A)       Susceptibility   Enterobacter cloacae - MIC*    CEFAZOLIN >=64 RESISTANT Resistant     CEFTRIAXONE <=1 SENSITIVE Sensitive     CIPROFLOXACIN <=0.25 SENSITIVE Sensitive     GENTAMICIN <=1 SENSITIVE Sensitive     IMIPENEM 1 SENSITIVE Sensitive      NITROFURANTOIN 32 SENSITIVE Sensitive     TRIMETH/SULFA <=20 SENSITIVE Sensitive     PIP/TAZO <=4 SENSITIVE Sensitive     * >=100,000 COLONIES/mL ENTEROBACTER CLOACAE  CBC with Differential/Platelet     Status: Abnormal   Collection Time: 03/10/16  4:24 AM  Result Value Ref Range   WBC 10.7 (H) 4.0 - 10.5 K/uL   RBC 4.06 (L) 4.22 - 5.81 MIL/uL   Hemoglobin 12.1 (L) 13.0 - 17.0 g/dL   HCT 37.8 (L) 39.0 - 52.0 %   MCV 93.1 78.0 - 100.0 fL   MCH 29.8 26.0 - 34.0 pg   MCHC 32.0 30.0 - 36.0 g/dL   RDW 13.8 11.5 - 15.5 %   Platelets 229 150 - 400 K/uL   Neutrophils Relative % 61 %   Neutro Abs 6.5 1.7 - 7.7 K/uL   Lymphocytes Relative 26 %   Lymphs Abs 2.8 0.7 - 4.0 K/uL   Monocytes Relative 11 %   Monocytes Absolute 1.1 (H) 0.1 - 1.0 K/uL   Eosinophils Relative 2 %   Eosinophils Absolute 0.2 0.0 - 0.7 K/uL   Basophils Relative 0 %   Basophils Absolute 0.0 0.0 - 0.1 K/uL  Basic metabolic panel     Status: Abnormal   Collection Time: 03/10/16  4:24 AM  Result Value Ref Range   Sodium 137 135 - 145 mmol/L   Potassium 3.7 3.5 - 5.1 mmol/L   Chloride 101 101 - 111 mmol/L   CO2 24 22 - 32 mmol/L   Glucose, Bld 104 (H) 65 - 99 mg/dL   BUN 37 (H) 6 - 20 mg/dL   Creatinine, Ser 1.32 (H) 0.61 - 1.24 mg/dL   Calcium 8.6 (L) 8.9 - 10.3 mg/dL   GFR calc non Af Amer 49 (L) >60 mL/min   GFR calc Af Amer 56 (L) >60 mL/min    Comment: (NOTE) The eGFR has been calculated using the CKD EPI equation. This calculation has not been validated in all clinical situations. eGFR's persistently <60 mL/min signify possible Chronic Kidney Disease.    Anion gap 12 5 - 15     Lipid Panel     Component Value Date/Time   CHOL 147 12/15/2013 0342   TRIG 84 12/15/2013 0342   HDL 54 12/15/2013 0342   CHOLHDL 2.7 12/15/2013 0342   VLDL 17 12/15/2013 0342   LDLCALC 76 12/15/2013 0342     Lab Results  Component Value Date   HGBA1C 6.4 (H) 12/15/2013     Lab Results  Component Value Date    LDLCALC 76 12/15/2013   CREATININE 1.32 (H) 03/10/2016     HPI   Brian Wolf is an 83 year old male with a history of intracerebral hemorrhage in NOV 2015, hypertension, sleep apnea. He presents after sustaining  a fall. The fall was unwitnessed, patient was found down, apparently his wife found him unconscious immediately after the fall, he recovered his consciousness slowly, was noted to be confused. CT head showed subarachnoid bleed. Neurosurgery contacted the recommendations for admission observation,.   The patient is currently taking Aspirin 81 mg daily   HOSPITAL COURSE:   1. Unwitnessed fall, Medial right temporal lobe subarachnoid bleed, hematoma measuring 1497 mm without associated mass effect. Patient has been admitted to the medical floor,  neuro checks remained stable, held antiplatelets or anticoagulants. Dr. Trenton Gammon on call neurosurgery was consulted by EDP and Dr. Trenton Gammon advised observation. Echocardiogram with normal LV function. MRI of the brain to rule out acute CVA, showed abnormality of the right hippocampal formation which appeared to be a trace amount of intra-axial hemorrhage without surrounding edema or mass effect. Patient had an unwitnessed fall with previous history of strokes. Hold antiplatelet therapy for now, but may need to resume in the next 1-2 weeks. Neurology consulted for recommendations as the MRI is concerning for an acute small infarct as well as a small hemorrhage. No evidence of cardiac arrhythmias. Discharge is being placed on hold pending neurology recommendations  2. Acute encephalopathy. EEG within normal limits. Mental status back to baseline.  3. Acute renal failure.  held diuretics and ARB. We'll continue to hold ARB for now and resume other medications for blood pressure control. Creatinine 1.32 at the time of discharge. Previously 0.9  4. Hypertension. Currently holding antihypertensive agents. Use hydralazine as needed.  5. Enterococcal UTI-patient  has been started on ciprofloxacin  Discharge Exam: *  Blood pressure (!) 132/45, pulse 65, temperature 98.6 F (37 C), temperature source Oral, resp. rate 16, height _0  (1.6 m), weight 57.4 kg (126 lb 9.6 oz), SpO2 97 %. General exam: Patient not in pain or dyspnea E ENT. No pallor or icterus Respiratory system: Clear to auscultation. Respiratory effort normal. Cardiovascular system: S1 & S2 heard, RRR. No JVD, murmurs, rubs, gallops or clicks. No pedal edema. Gastrointestinal system: Abdomen is nondistended, soft and nontender. No organomegaly or masses felt. Normal bowel sounds heard. Central nervous system: Alert and oriented. No focal neurological deficits. Extremities: Symmetric 5 x 5 power. Skin: No rashes, lesions or ulcers. Mild echymmosis on the right face.      Follow-up Information    Simona Huh, MD. Call.   Specialty:  Family Medicine Why:  To make follow-up appointment in 3-5 days, hospital follow-up Contact information: [address]  [place] [address] [phone]        Antony Contras, MD. Call.   Specialties:  Neurology, Radiology Why:  To make follow-up appointment, recent admission for trace amount of intra-axial hemorrhage  Contact information: [place] [address] [phone]           Signed: Reyne Dumas 03/11/2016, 9:17 AM        Time spent >45 mins

## 2016-03-11 NOTE — Progress Notes (Signed)
RT spoke with patient about wearing CPAP and the patient stated that he doesn't want to wear CPAP at this time.  Patient stated that he can't wear the type of mask that is provided. Pt encouraged to have family bring his mask if possible. RT will continue to monitor.   RT advised patient that if he changed his mind that he could have the RN notify respiratory to bring machine and mask.

## 2016-03-12 ENCOUNTER — Inpatient Hospital Stay (HOSPITAL_COMMUNITY): Payer: Medicare Other

## 2016-03-12 MED ORDER — GUAIFENESIN-DM 100-10 MG/5ML PO SYRP: 5.0000 mL | ORAL_SOLUTION | ORAL | 0 refills | Status: DC | PRN

## 2016-03-12 MED ORDER — OXYCODONE HCL 5 MG/5ML PO SOLN: 5.0000 mg | Freq: Four times a day (QID) | ORAL | Status: DC | PRN

## 2016-03-12 NOTE — Discharge Summary (Addendum)
Physician Discharge Summary  Brian Wolf MRN: [id number] DOB/AGE: 03/01/1931 83 y.o.  PCP: Simona Huh, MD   Admit date: 03/08/2016 Discharge date: 03/12/2016  Discharge Diagnoses:    Principal Problem:   Subarachnoid bleed [facility]) Active Problems:   Encephalopathy acute   Essential hypertension   History of stroke   Sleep apnea   SAH (subarachnoid hemorrhage) ([place])   Fall Enterococcal UTI L 3rd rib fracture   Follow-up recommendations Follow-up with PCP in 3-5 days , including all  additional recommended appointments as below Follow-up CBC, CMP in 3-5 days Patient is followed by Antony Contras, MD,       Current Discharge Medication List    START taking these medications   Details  ciprofloxacin (CIPRO) 500 MG tablet Take 1 tablet (500 mg total) by mouth 2 (two) times daily. Qty: 14 tablet, Refills: 0    guaiFENesin-dextromethorphan (ROBITUSSIN DM) 100-10 MG/5ML syrup Take 5 mLs by mouth every 4 (four) hours as needed for cough. Qty: 118 mL, Refills: 0      CONTINUE these medications which have CHANGED   Details  losartan (COZAAR) 100 MG tablet Take 1 tablet (100 mg total) by mouth daily. Qty: 30 tablet, Refills: 0      CONTINUE these medications which have NOT CHANGED   Details  aspirin 81 MG tablet Take 81 mg by mouth daily.    fexofenadine (ALLEGRA) 30 MG tablet Take 30 mg by mouth 2 (two) times daily.    hydrochlorothiazide (HYDRODIURIL) 25 MG tablet Take 1.5 tablets (37.5 mg total) by mouth daily. Qty: 45 tablet, Refills: 1    metoprolol (LOPRESSOR) 50 MG tablet Take 50 mg by mouth daily.     Probiotic Product (PROBIOTIC DAILY PO) Take 1 capsule by mouth daily.     ranitidine (ZANTAC) 150 MG tablet Take 150 mg by mouth 2 (two) times daily.    simvastatin (ZOCOR) 40 MG tablet Take 40 mg by mouth daily.    Travoprost, BAK Free, (TRAVATAN) 0.004 % SOLN ophthalmic solution Place 1 drop into both eyes at bedtime.        Discharge Condition:  Stable  Discharge Instructions Get Medicines reviewed and adjusted: Please take all your medications with you for your next visit with your Primary MD  Please request your Primary MD to go over all hospital tests and procedure/radiological results at the follow up, please ask your Primary MD to get all Hospital records sent to his/her office.  If you experience worsening of your admission symptoms, develop shortness of breath, life threatening emergency, suicidal or homicidal thoughts you must seek medical attention immediately by calling 911 or calling your MD immediately if symptoms less severe.  You must read complete instructions/literature along with all the possible adverse reactions/side effects for all the Medicines you take and that have been prescribed to you. Take any new Medicines after you have completely understood and accpet all the possible adverse reactions/side effects.   Do not drive when taking Pain medications.   Do not take more than prescribed Pain, Sleep and Anxiety Medications  Special Instructions: If you have smoked or chewed Tobacco in the last 2 yrs please stop smoking, stop any regular Alcohol and or any Recreational drug use.  Wear Seat belts while driving.  Please note  You were cared for by a hospitalist during your hospital stay. Once you are discharged, your primary care physician will handle any further medical issues. Please note that NO REFILLS for any discharge medications will be  authorized once you are discharged, as it is imperative that you return to your primary care physician (or establish a relationship with a primary care physician if you do not have one) for your aftercare needs so that they can reassess your need for medications and monitor your lab values.  Discharge Instructions    Ambulatory referral to Occupational Therapy    Complete by:  As directed    Ambulatory referral to Physical Therapy    Complete by:  As directed    Diet -  low sodium heart healthy    Complete by:  As directed    Increase activity slowly    Complete by:  As directed        No Known Allergies    Disposition: 06-Home-Health Care Svc   Consults:  Telephone consultation with neurology, Dr. Verita Lamb   Significant Diagnostic Studies:  Dg Chest 2 View  Result Date: 03/11/2016 CLINICAL DATA:  Left-sided chest pain. EXAM: CHEST  2 VIEW COMPARISON:  March 08, 2016 FINDINGS: Mild bibasilar atelectasis. The heart, hila, mediastinum, lungs, and pleura are otherwise unchanged with no acute abnormalities. IMPRESSION: No active cardiopulmonary disease. Electronically Signed   By: Dorise Bullion III M.D   On: 03/11/2016 14:48   Dg Chest 2 View  Result Date: 03/08/2016 CLINICAL DATA:  Cough EXAM: CHEST  2 VIEW COMPARISON:  Chest radiograph 12/16/2013 FINDINGS: Previously seen central venous catheter is been removed. The cardiomediastinal silhouette is unchanged. There is no focal airspace consolidation or pulmonary edema. No pneumothorax or pleural effusion. Lung inflation shallow. Bibasilar atelectasis. IMPRESSION: Shallow lung inflation and bibasilar atelectasis. No focal airspace consolidation or pulmonary edema. Electronically Signed   By: Ulyses Jarred M.D.   On: 03/08/2016 22:51   Dg Ribs Unilateral Left  Result Date: 03/11/2016 CLINICAL DATA:  Status post fall. Left-sided anterior chest wall pain. EXAM: LEFT RIBS - 2 VIEW COMPARISON:  None. FINDINGS: Nondisplaced left third anterolateral rib fracture. No other rib fracture. No pneumothorax. IMPRESSION: Nondisplaced left third anterolateral rib fracture. Electronically Signed   By: Kathreen Devoid   On: 03/11/2016 14:50   Ct Head Wo Contrast  Result Date: 03/12/2016 CLINICAL DATA:  83 year old male found down unconscious by wife. Right posterior hippocampal region hemorrhage, question acute or chronic. 2015 large posterior right hemisphere intra-axial hemorrhage. Initial encounter. EXAM: CT HEAD  WITHOUT CONTRAST TECHNIQUE: Contiguous axial images were obtained from the base of the skull through the vertex without intravenous contrast. COMPARISON:  Brain MRI 03/10/2016.  Head CT 03/08/2016. FINDINGS: Brain: Compared to the CT on 03/08/2016 hyperdensity in the mid body of the right hippocampal formation has mildly regressed (compare coronal image 40 today to previous coronal image 40, and sagittal images 27 and 28 today to the same sagittal images previously). As before there is no significant regional mass effect. No extra-axial extension. No new intracranial hemorrhage identified. Encephalomalacia in the posterior right hemisphere and confluent bilateral cerebral white matter changes re- demonstrated. No intracranial mass effect. No ventriculomegaly. No acute cortically based infarct. Vascular: Calcified atherosclerosis at the skull base. Skull: Stable. No acute osseous abnormality identified. Sinuses/Orbits: Not significantly changed; right ethmoid and mild right maxillary sinus mucosal thickening. Incidental left frontal sinus osteoma. Tympanic cavities and mastoids remain well pneumatized. Other: No acute orbit or scalp soft tissue finding. IMPRESSION: 1. There has been partial regression of the small right hippocampal hemorrhage since the 03/08/2016 CT, indicating that it was acute at the time of presentation. There remains no regional mass effect and  no extra-axial or intraventricular extension. 2. No new intracranial abnormality. 3. Encephalomalacia related to the large 2015 right posterior hemisphere hemorrhage and advanced chronic small vessel disease. Electronically Signed   By: Genevie Ann M.D.   On: 03/12/2016 11:07   Ct Head Wo Contrast  Result Date: 03/08/2016 CLINICAL DATA:  Fall with abrasion to the left eyelid. EXAM: CT HEAD WITHOUT CONTRAST TECHNIQUE: Contiguous axial images were obtained from the base of the skull through the vertex without intravenous contrast. COMPARISON:  Brain MRI  12/17/2013 FINDINGS: Brain: There is a small focus of hemorrhage along the medial right temporal lobe, measuring approximately 14 x 9 x 7 mm. There is no other evidence of acute hemorrhage. There is no midline shift or mass effect. There is right occipital encephalomalacia at the site of prior hemorrhagic event. There is no evidence of acute cortical infarct. There is periventricular hypoattenuation compatible with chronic microvascular disease. Vascular: Atherosclerotic calcification of the vertebral and internal carotid arteries at the skull base. Skull: Normal. Negative for fracture or focal lesion. Sinuses/Orbits: There is a small left frontal sinus osteoma. No paranasal sinus fluid levels or advanced mucosal thickening. No mastoid effusion. Bilateral lens replacements. Other: None. IMPRESSION: 1. Small focus of hemorrhage along the medial right temporal lobe, favored to be subarachnoid, however an intraparenchymal location would be difficult to exclude. The hematoma measures approximately 14 x 9 x 7 mm without associated mass effect. 2. Right parietal encephalomalacia at the site of prior hemorrhagic insult. Critical Value/emergent results were called by telephone at the time of interpretation on 03/08/2016 at 11:02 pm to Dr. Dayna Barker, who verbally acknowledged these results. Electronically Signed   By: Ulyses Jarred M.D.   On: 03/08/2016 23:03   Mr Brain Wo Contrast  Result Date: 03/10/2016 CLINICAL DATA:  83 year old male found down unconscious by wife. Right posterior hippocampal region intraparenchymal versus subarachnoid hemorrhage on noncontrast head CT. Initial encounter. EXAM: MRI HEAD WITHOUT CONTRAST TECHNIQUE: Multiplanar, multiecho pulse sequences of the brain and surrounding structures were obtained without intravenous contrast. COMPARISON:  Head CT without contrast 03/08/2016. Brain MRI 12/17/2013 and earlier. FINDINGS: Brain: Posterior right hemisphere encephalomalacia with interval expected  evolution of the large parenchymal hemorrhage which occurred in 2015. Ex vacuo enlargement of the right occipital horn and posterior aspect of the right temporal horn. No residual regional mass effect. There is a small area also of hemosiderin along the posterior hippocampal formation seen on T2* imaging (series 8, image 8) and corresponding to the recent CT finding. This does appear to be intra-axial (series 10, image 10), with associated diffusion signal heterogeneity which is probably susceptibility related (series 7, image 11) no definite surrounding edema and no regional mass effect. There appears to be atrophy of the tail of the right hippocampus (series 10, image 9). There is also a small 1-2 mm focus of cortical restricted diffusion at the right occipital pole (series 4, image 16). This is just medial to the area of encephalomalacia, with no significant T2 or FLAIR hyperintensity. Also, a similar focus of diffusion signal was present here in 2015 such that this is not suspected to be acute ischemia. No other convincing area of restricted diffusion. No midline shift, mass effect, evidence of mass lesion, ventriculomegaly, or extra-axial collection. Cervicomedullary junction within normal limits. Chronic partially empty sella. Outside of the above signal changes there is chronic widespread Patchy and confluent nonspecific cerebral white matter T2 and FLAIR hyperintensity. Chronic T2 heterogeneity in the deep gray matter nuclei is stable  since 2015. Negative brainstem and cerebellum. Vascular: Major intracranial vascular flow voids are stable since 2015 with a degree of generalized intracranial artery dolichoectasia. Skull and upper cervical spine: Negative. Normal bone marrow signal. Sinuses/Orbits: Interval postoperative changes to the right globe. Otherwise negative orbits soft tissues. Mild right ethmoid and maxillary sinus mucosal thickening. Small volume retained secretions in the nasopharynx. Interval  improved right mastoid aeration. Other: Visible internal auditory structures appear normal. Negative scalp soft tissues. IMPRESSION: 1. Expected encephalomalacia in the area of the large posterior right hemisphere hemorrhage that occurred in 2015. 2. Superimposed signal abnormality at the right hippocampal formation corresponding to the recent CT finding appears to be a trace amount of intra-axial hemorrhage without surrounding edema or mass effect. This could be the sequelae of a recent small hemorrhagic lacunar infarct, or alternatively could be chronic right hippocampal micohemorrhage. There is superimposed atrophy of the tail of the right hippocampus. 3. Underlying widespread signal changes in both hemispheres favored to reflect advanced chronic small vessel disease. Electronically Signed   By: Genevie Ann M.D.   On: 03/10/2016 12:47        Filed Weights   03/09/16 0124  Weight: 57.4 kg (126 lb 9.6 oz)     Microbiology: Recent Results (from the past 240 hour(s))  Culture, Urine     Status: Abnormal   Collection Time: 03/09/16  4:44 PM  Result Value Ref Range Status   Specimen Description URINE, RANDOM  Final   Special Requests NONE  Final   Culture >=100,000 COLONIES/mL ENTEROBACTER CLOACAE (A)  Final   Report Status 03/11/2016 FINAL  Final   Organism ID, Bacteria ENTEROBACTER CLOACAE (A)  Final      Susceptibility   Enterobacter cloacae - MIC*    CEFAZOLIN >=64 RESISTANT Resistant     CEFTRIAXONE <=1 SENSITIVE Sensitive     CIPROFLOXACIN <=0.25 SENSITIVE Sensitive     GENTAMICIN <=1 SENSITIVE Sensitive     IMIPENEM 1 SENSITIVE Sensitive     NITROFURANTOIN 32 SENSITIVE Sensitive     TRIMETH/SULFA <=20 SENSITIVE Sensitive     PIP/TAZO <=4 SENSITIVE Sensitive     * >=100,000 COLONIES/mL ENTEROBACTER CLOACAE       Blood Culture    Component Value Date/Time   SDES URINE, RANDOM 03/09/2016 1644   SPECREQUEST NONE 03/09/2016 1644   CULT >=100,000 COLONIES/mL ENTEROBACTER CLOACAE  (A) 03/09/2016 1644   REPTSTATUS 03/11/2016 FINAL 03/09/2016 1644      Labs: No results found for this or any previous visit (from the past 48 hour(s)).   Lipid Panel     Component Value Date/Time   CHOL 147 12/15/2013 0342   TRIG 84 12/15/2013 0342   HDL 54 12/15/2013 0342   CHOLHDL 2.7 12/15/2013 0342   VLDL 17 12/15/2013 0342   LDLCALC 76 12/15/2013 0342     Lab Results  Component Value Date   HGBA1C 6.4 (H) 12/15/2013     Lab Results  Component Value Date   LDLCALC 76 12/15/2013   CREATININE 1.32 (H) 03/10/2016     HPI   Brian Wolf is an 83 year old male with a history of intracerebral hemorrhage in NOV 2015, hypertension, sleep apnea. He presents after sustaining a fall. The fall was unwitnessed, patient was found down, apparently his wife found him unconscious immediately after the fall, he recovered his consciousness slowly, was noted to be confused. CT head showed subarachnoid bleed. Neurosurgery contacted the recommendations for admission observation,.   The patient is currently taking  Aspirin 81 mg daily   HOSPITAL COURSE:   1. Unwitnessed fall, Medial right temporal lobe subarachnoid bleed, hematoma measuring 1497 mm without associated mass effect. Patient has been admitted to the medical floor,  neuro checks remained stable, held antiplatelets or anticoagulants. Dr. Trenton Gammon on call neurosurgery was consulted by EDP and Dr. Trenton Gammon advised observation. Echocardiogram with normal LV function. MRI of the brain to rule out acute CVA, showed abnormality of the right hippocampal formation which appeared to be a trace amount of intra-axial hemorrhage without surrounding edema or mass effect. Patient had an unwitnessed fall with previous history of strokes.  Neurology consulted for recommendations as the MRI is concerning for an acute small infarct as well as a small hemorrhage. No evidence of cardiac arrhythmias. Neurology feels that  right medial temporal hemorrhage after a  fall suspicious for fall related hemorrhage rather than a primary hemorrhage. Repeat CT head after being on aspirin for one day shows regression of the lesion.EEG is normal and not suggestive of any seizures. No further recommendations from Neurology at this time. As per neurology, aspirin will be continued at DC and patient to follow up with Dr Leonie Man   2. Acute encephalopathy. EEG within normal limits. Mental status back to baseline.  3. Acute renal failure.  held diuretics and ARB. We'll continue to hold ARB for now and resume other medications for blood pressure control. Creatinine 1.32 at the time of discharge. Previously 0.9  4. Hypertension. Currently holding antihypertensive agents. Use hydralazine as needed.  5. Enterococcal UTI-patient has been started on ciprofloxacin  6. Left 3 rd rib fracture - recommend pain control with nonsedating meds, and cough medicine      Discharge Exam: *  Blood pressure 136/68, pulse (!) 58, temperature 98.9 F (37.2 C), temperature source Oral, resp. rate 18, height 5\' 3"  (1.6 m), weight 57.4 kg (126 lb 9.6 oz), SpO2 99 %. General exam: Patient not in pain or dyspnea E ENT. No pallor or icterus Respiratory system: Clear to auscultation. Respiratory effort normal. Cardiovascular system: S1 & S2 heard, RRR. No JVD, murmurs, rubs, gallops or clicks. No pedal edema. Gastrointestinal system: Abdomen is nondistended, soft and nontender. No organomegaly or masses felt. Normal bowel sounds heard. Central nervous system: Alert and oriented. No focal neurological deficits. Extremities: Symmetric 5 x 5 power. Skin: No rashes, lesions or ulcers. Mild echymmosis on the right face.      Follow-up Information    Simona Huh, MD. Call.   Specialty:  Family Medicine Why:  To make follow-up appointment in 3-5 days, hospital follow-up Contact information: [address] [place] [address] [phone]        Antony Contras, MD.  Call.   Specialties:  Neurology, Radiology Why:  To make follow-up appointment, recent admission for trace amount of intra-axial hemorrhage  Contact information: [place] [address] [phone]           Signed: Reyne Dumas 03/12/2016, 3:27 PM        Time spent >45 mins

## 2016-03-12 NOTE — Care Management Note (Signed)
Case Management Note  Patient Details  Name: Eula Mazzola MRN: [id number] Date of Birth: Oct 28, 1931  Subjective/Objective:                    Action/Plan: CM consulted for outpatient therapy. CM met with the patient and his wife and asked about outpatient therapy. The patient was not interested but his wife was. CM provided them the orders for the outpatient therapy so they could arrange after d/c if he decides he would like to attend outpatient therapy. The wife was interested in a rehab at [place] and is going to try and convince the patient to attend.   Expected Discharge Date:                  Expected Discharge Plan:  Home/Self Care  In-House Referral:  NA  Discharge planning Services  CM Consult  Post Acute Care Choice:    Choice offered to:     DME Arranged:    DME Agency:     HH Arranged:    HH Agency:     Status of Service:  Completed, signed off  If discussed at [employer] of Stay Meetings, dates discussed:    Additional Comments:  Pollie Friar, RN 03/12/2016, 2:48 PM

## 2016-03-12 NOTE — Progress Notes (Signed)
Pt D/C home, alert and oriented, no new concerns, D/C education and instructions with teach back provided, Pt/family member verbalize understanding.

## 2016-03-12 NOTE — Care Management Important Message (Signed)
Important Message  Patient Details  Name: Brian Wolf MRN: [id number] Date of Birth: [birth year]   Medicare Important Message Given:  Yes    Orbie Pyo 03/12/2016, 2:44 PM

## 2016-03-12 NOTE — Progress Notes (Signed)
Subjective: No acute events overnight. Complains of left-sided rib-cage pain.   Objective: Current vital signs: BP 132/61 (BP Location: Right Arm)   Pulse 63   Temp 97.8 F (36.6 C) (Oral)   Resp 18   Ht 5\' 3"  (1.6 m)   Wt 126 lb 9.6 oz (57.4 kg)   SpO2 96%   BMI 22.43 kg/m  Vital signs in last 24 hours: Temp:  [97.8 F (36.6 C)-99.2 F (37.3 C)] 97.8 F (36.6 C) (02/09 1008) Pulse Rate:  [56-64] 63 (02/09 1008) Resp:  [16-18] 18 (02/09 1008) BP: (109-144)/(50-67) 132/61 (02/09 1008) SpO2:  [96 %-98 %] 96 % (02/09 1008)  Intake/Output from previous day: 02/08 0701 - 02/09 0700 In: 340 [P.O.:240; IV Piggyback:100] Out: -  Intake/Output this shift: Total I/O In: 240 [P.O.:240] Out: -  Nutritional status: Diet Heart Room service appropriate? Yes; Fluid consistency: Thin  ROS:                                                                                                                                       History obtained from the patient  General ROS: negative for - chills, fatigue, fever, night sweats, weight gain or weight loss Psychological ROS: negative for - behavioral disorder, hallucinations, memory difficulties, mood swings or suicidal ideation Ophthalmic ROS: negative for - blurry vision, double vision, eye pain or loss of vision ENT ROS: negative for - epistaxis, nasal discharge, oral lesions, sore throat, tinnitus or vertigo Allergy and Immunology ROS: negative for - hives or itchy/watery eyes Hematological and Lymphatic ROS: negative for - bleeding problems, bruising or swollen lymph nodes Endocrine ROS: negative for - galactorrhea, hair pattern changes, polydipsia/polyuria or temperature intolerance Respiratory ROS: negative for - cough, hemoptysis, shortness of breath or wheezing Cardiovascular ROS: negative for - dyspnea on exertion, edema or irregular heartbeat Gastrointestinal ROS: negative for - abdominal pain, diarrhea, hematemesis, nausea/vomiting or  stool incontinence Genito-Urinary ROS: negative for - dysuria, hematuria, incontinence or urinary frequency/urgency Musculoskeletal ROS: negative for - joint swelling or muscular weakness Neurological ROS: as noted in HPI Dermatological ROS: negative for rash and skin lesion changes    Neurologic Exam: General: NAD Mental Status: Alert and oriented x 3. Speech fluent. Thought content appropriate. Follows commands.  Cranial Nerves: II:  Visual fields grossly normal, PERRLA III,IV, VI: ptosis not present, extra-ocular motions intact bilaterally V,VII: smile symmetric, facial light touch sensation normal bilaterally VIII: hearing normal bilaterally IX,X: uvula rises symmetrically XI: bilateral shoulder shrug XII: midline tongue extension without atrophy or fasciculations  Motor: Right : Upper extremity   5/5    Left:     Upper extremity   5/5  Lower extremity   5/5     Lower extremity   5/5 Tone and bulk:normal tone throughout; no atrophy noted Sensory: Light touch intact throughout, bilaterally Deep Tendon Reflexes: 2+ throughout Plantars: Right: downgoing   Left: downgoing Cerebellar: normal  finger-to-nose,  normal heel-to-shin test Gait: Did not perform this morning as getting ready to go for CT scan  Lab Results: Basic Metabolic Panel:  Recent Labs Lab 03/08/16 2303 03/09/16 0512 03/10/16 0424  NA 137 139 137  K 3.9 3.8 3.7  CL 102 102 101  CO2 22 26 24   GLUCOSE 137* 132* 104*  BUN 34* 33* 37*  CREATININE 1.45* 1.42* 1.32*  CALCIUM 9.3 9.0 8.6*    Liver Function Tests:  Recent Labs Lab 03/08/16 2303 03/09/16 0512  AST 29 26  ALT 20 19  ALKPHOS 39 38  BILITOT 0.8 0.7  PROT 7.4 7.0  ALBUMIN 3.8 3.4*   CBC:  Recent Labs Lab 03/08/16 2303 03/09/16 0512 03/10/16 0424  WBC 13.6* 11.5* 10.7*  NEUTROABS 10.4* 7.6 6.5  HGB 13.0 12.1* 12.1*  HCT 39.5 37.1* 37.8*  MCV 92.5 92.5 93.1  PLT 244 232 229    CBG:  Recent Labs Lab 03/08/16 2114   GLUCAP 127*    Microbiology: Results for orders placed or performed during the hospital encounter of 03/08/16  Culture, Urine     Status: Abnormal   Collection Time: 03/09/16  4:44 PM  Result Value Ref Range Status   Specimen Description URINE, RANDOM  Final   Special Requests NONE  Final   Culture >=100,000 COLONIES/mL ENTEROBACTER CLOACAE (A)  Final   Report Status 03/11/2016 FINAL  Final   Organism ID, Bacteria ENTEROBACTER CLOACAE (A)  Final      Susceptibility   Enterobacter cloacae - MIC*    CEFAZOLIN >=64 RESISTANT Resistant     CEFTRIAXONE <=1 SENSITIVE Sensitive     CIPROFLOXACIN <=0.25 SENSITIVE Sensitive     GENTAMICIN <=1 SENSITIVE Sensitive     IMIPENEM 1 SENSITIVE Sensitive     NITROFURANTOIN 32 SENSITIVE Sensitive     TRIMETH/SULFA <=20 SENSITIVE Sensitive     PIP/TAZO <=4 SENSITIVE Sensitive     * >=100,000 COLONIES/mL ENTEROBACTER CLOACAE    Imaging: Dg Chest 2 View  Result Date: 03/11/2016 CLINICAL DATA:  Left-sided chest pain. EXAM: CHEST  2 VIEW COMPARISON:  March 08, 2016 FINDINGS: Mild bibasilar atelectasis. The heart, hila, mediastinum, lungs, and pleura are otherwise unchanged with no acute abnormalities. IMPRESSION: No active cardiopulmonary disease. Electronically Signed   By: Dorise Bullion III M.D   On: 03/11/2016 14:48   Dg Ribs Unilateral Left  Result Date: 03/11/2016 CLINICAL DATA:  Status post fall. Left-sided anterior chest wall pain. EXAM: LEFT RIBS - 2 VIEW COMPARISON:  None. FINDINGS: Nondisplaced left third anterolateral rib fracture. No other rib fracture. No pneumothorax. IMPRESSION: Nondisplaced left third anterolateral rib fracture. Electronically Signed   By: Kathreen Devoid   On: 03/11/2016 14:50   Mr Brain Wo Contrast  Result Date: 03/10/2016 CLINICAL DATA:  83 year old male found down unconscious by wife. Right posterior hippocampal region intraparenchymal versus subarachnoid hemorrhage on noncontrast head CT. Initial encounter. EXAM:  MRI HEAD WITHOUT CONTRAST TECHNIQUE: Multiplanar, multiecho pulse sequences of the brain and surrounding structures were obtained without intravenous contrast. COMPARISON:  Head CT without contrast 03/08/2016. Brain MRI 12/17/2013 and earlier. FINDINGS: Brain: Posterior right hemisphere encephalomalacia with interval expected evolution of the large parenchymal hemorrhage which occurred in 2015. Ex vacuo enlargement of the right occipital horn and posterior aspect of the right temporal horn. No residual regional mass effect. There is a small area also of hemosiderin along the posterior hippocampal formation seen on T2* imaging (series 8, image 8) and corresponding to the recent CT  finding. This does appear to be intra-axial (series 10, image 10), with associated diffusion signal heterogeneity which is probably susceptibility related (series 7, image 11) no definite surrounding edema and no regional mass effect. There appears to be atrophy of the tail of the right hippocampus (series 10, image 9). There is also a small 1-2 mm focus of cortical restricted diffusion at the right occipital pole (series 4, image 16). This is just medial to the area of encephalomalacia, with no significant T2 or FLAIR hyperintensity. Also, a similar focus of diffusion signal was present here in 2015 such that this is not suspected to be acute ischemia. No other convincing area of restricted diffusion. No midline shift, mass effect, evidence of mass lesion, ventriculomegaly, or extra-axial collection. Cervicomedullary junction within normal limits. Chronic partially empty sella. Outside of the above signal changes there is chronic widespread Patchy and confluent nonspecific cerebral white matter T2 and FLAIR hyperintensity. Chronic T2 heterogeneity in the deep gray matter nuclei is stable since 2015. Negative brainstem and cerebellum. Vascular: Major intracranial vascular flow voids are stable since 2015 with a degree of generalized  intracranial artery dolichoectasia. Skull and upper cervical spine: Negative. Normal bone marrow signal. Sinuses/Orbits: Interval postoperative changes to the right globe. Otherwise negative orbits soft tissues. Mild right ethmoid and maxillary sinus mucosal thickening. Small volume retained secretions in the nasopharynx. Interval improved right mastoid aeration. Other: Visible internal auditory structures appear normal. Negative scalp soft tissues. IMPRESSION: 1. Expected encephalomalacia in the area of the large posterior right hemisphere hemorrhage that occurred in 2015. 2. Superimposed signal abnormality at the right hippocampal formation corresponding to the recent CT finding appears to be a trace amount of intra-axial hemorrhage without surrounding edema or mass effect. This could be the sequelae of a recent small hemorrhagic lacunar infarct, or alternatively could be chronic right hippocampal micohemorrhage. There is superimposed atrophy of the tail of the right hippocampus. 3. Underlying widespread signal changes in both hemispheres favored to reflect advanced chronic small vessel disease. Electronically Signed   By: Genevie Ann M.D.   On: 03/10/2016 12:47    Medications: I have reviewed the patient's current medications.  Assessment/Plan:  83yo man with previous hx of right temporal hemorrhage who presented after a fall at home found to have a small anterior temporal hemorrhage.   Anterior Temporal Hemorrhage: Aspirin restarted yesterday. Will obtain repeat CT head without contrast this morning to ensure no additional bleeding. His exam is completely normal. Expect that he will be able to go home later today pending CT results.   UTI: Per primary team   Albin Felling, MD, MPH Internal Medicine Resident, PGY-III Pager: [phone] 03/12/2016, 10:52 AM

## 2016-03-12 NOTE — Progress Notes (Signed)
Physical Therapy Treatment Patient Details Name: Brian Wolf MRN: [id number] DOB: 1931-12-16 Today's Date: 03/12/2016    History of Present Illness Pt is 83 y.o. male with history of stroke with left-sided hemiparesis, sleep apnea, hypertension was brought to the ER s/p unwitnessed fall while patient was in his room. Patient does not recall how he fell. In the ER patient had CT head which showed subarachnoid bleed. L 3rd rib fracture.     PT Comments    Pt progressing well with mobility. He is independent with bed mobility, transfers, and ambulating 500' without an assistive device. Instructed pt to brace L side with pillow 2* rib fx pain. PT goals met, will sign off. Pt was encouraged to walk in halls at least BID.    Follow Up Recommendations  No PT follow up     Equipment Recommendations  None recommended by PT    Recommendations for Other Services       Precautions / Restrictions Precautions Precautions: Fall Precaution Comments: prior fall at home Restrictions Weight Bearing Restrictions: No    Mobility  Bed Mobility Overal bed mobility: Modified Independent                Transfers Overall transfer level: Independent Equipment used: None Transfers: Sit to/from Stand Sit to Stand: Independent         General transfer comment: steady, no LOB  Ambulation/Gait Ambulation/Gait assistance: Independent Ambulation Distance (Feet): 500 Feet Assistive device: None Gait Pattern/deviations: Step-through pattern;Wide base of support Gait velocity: WFL   General Gait Details: steady without LOB, instructed pt to brace ribs with pillow under LUE while walking 2* rib pain   Stairs            Wheelchair Mobility    Modified Rankin (Stroke Patients Only) Modified Rankin (Stroke Patients Only) Pre-Morbid Rankin Score: No symptoms Modified Rankin: No significant disability     Balance     Sitting balance-[name]: Normal       Standing  balance-[name]: Good                      Cognition Arousal/Alertness: Awake/alert Behavior During Therapy: WFL for tasks assessed/performed Overall Cognitive Status: Within Functional Limits for tasks assessed                      Exercises      General Comments        Pertinent Vitals/Pain Faces Pain Scale: Hurts even more Pain Location: L lateral chest  Pain Descriptors / Indicators: Sore Pain Intervention(s): Limited activity within patient's tolerance;Monitored during session (pt declined pain medication)    Home Living                      Prior Function            PT Goals (current goals can now be found in the care plan section) Acute Rehab PT Goals Patient 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, pt is a Therapist, nutritional PT Goal Formulation: With patient/family Time For Goal Achievement: 03/13/16 Potential to Achieve Goals: Good Progress towards PT goals: Goals met/education completed, patient discharged from PT    Frequency    Min 4X/week      PT Plan Current plan remains appropriate    Co-evaluation             End of Session Equipment Utilized During Treatment: Gait belt Activity Tolerance: Patient tolerated treatment well Patient  left: in chair;with call bell/phone within reach;with nursing/sitter in room     Time: 1250-1305 PT Time Calculation (min) (ACUTE ONLY): 15 min  Charges:  $Gait Training: 8-22 mins                    G Codes:      Blondell Reveal Kistler 03/12/2016, 1:13 PM [phone]

## 2016-03-12 NOTE — Progress Notes (Signed)
Nutrition Brief Note  Patient identified on the Malnutrition Screening Tool (MST) Report  Wt Readings from Last 15 Encounters:  03/09/16 126 lb 9.6 oz (57.4 kg)  10/14/15 166 lb (75.3 kg)  06/11/15 171 lb 6.4 oz (77.7 kg)  04/03/15 170 lb (77.1 kg)  11/11/14 172 lb (78 kg)  06/04/14 170 lb (77.1 kg)  05/09/14 168 lb (76.2 kg)  03/05/14 170 lb 9.6 oz (77.4 kg)  01/14/14 171 lb 9.6 oz (77.8 kg)  12/16/13 170 lb 6.7 oz (77.3 kg)    Body mass index is 22.43 kg/m. Patient meets criteria for Normal Weight based on current BMI. Weight history shows a 40 lbs wt loss in the past 5 months. Pt states that recent weight is inaccurate. He states that he has been eating healthy and avoiding late night snacking and has lost 5-6 lbs in the past 6 months. He states that he avoids salt and steams vegetables. He denies any nutrition questions or concerns at this time.   Current diet order is Heart Healthy, patient is consuming approximately 100% of meals at this time. Labs and medications reviewed.   No nutrition interventions warranted at this time. If nutrition issues arise, please consult RD.   Scarlette Ar RD, CSP, LDN Inpatient Clinical Dietitian Pager: [phone] After Hours Pager: [phone]

## 2016-06-10 ENCOUNTER — Ambulatory Visit: Payer: Medicare Other | Admitting: Neurology

## 2016-08-05 ENCOUNTER — Encounter: Payer: Self-pay | Admitting: Neurology

## 2016-08-05 ENCOUNTER — Ambulatory Visit (INDEPENDENT_AMBULATORY_CARE_PROVIDER_SITE_OTHER): Payer: Medicare Other | Admitting: Neurology

## 2016-08-05 VITALS — BP 156/62 | HR 48 | Ht 65.0 in | Wt 167.8 lb

## 2016-08-05 DIAGNOSIS — I6990 Unspecified sequelae of unspecified cerebrovascular disease: Secondary | ICD-10-CM | POA: Diagnosis not present

## 2016-08-05 NOTE — Progress Notes (Addendum)
PATIENT: Brian Wolf DOB: Nov 28, 1931  REASON FOR VISIT: follow up- History of CVA, OSA on CPAP HISTORY FROM: patient  HISTORY OF PRESENT ILLNESS:  Brian Wolf is an 83 year old male with a history of intracerebral hemorrhage in NOV 2015. He returns today for follow-up. The patient is currently taking Aspirin 81 mg daily. The patient's blood pressure is elevated today. The patient is quite anxious and so that may be the reason of his increased pressure. Patient states that his primary care manages his cholesterol and frequently checks his hemoglobin A1c. The patient continues to have some visual loss in the left lower peripheral field. Since the last visit the patient did have a sleep study and was started on CPAP machine. His download today indicates that he used his machine 30 out of 30 days for compliance of 100%. He uses machine greater than 4 hours for 30 days for compliance of 100%. On average he uses his machine 8 hours and 30 minutes. His residual AHI is 2.7 on 6 cm of water with EPR of 1. the patient states that he feels that the CPAP has been beneficial. He states he wakes up and feels that his sleep has been restored. His Epworth sleepiness score is 8 and fatigue severity score is 32. He returns today for an evaluation.  HISTORY 05/09/14: Brian Wolf is an 83 year old male with a history of intracerebral hemorrhage in November 2015. He returns today for follow-up. The patient is currently taking aspirin 81 mg daily. Patient blood pressure is slightly elevated today. He states that his primary care has been managing his blood pressure. He states that the home health nurse that came to visit recommended that his metoprolol dose be decreased due to his heart rate. His heart rate is slightly lower today. Patient's primary care also manages his cholesterol he is currently on Zocor. The patient's left-sided weakness has resolved. He states that he still has some slight visual loss in the left lower  peripheral field. The patient continues to not operate a motor vehicle. He has an appointment with his ophthalmologist next week. He states he may have surgery on the right eye due to cataract. The patient can complete all ADLs independently. Denies any changes with his gait or balance. Denies any falls. Patient also states that he has a history of obstructive sleep apnea he was on the CPAP machine but has not used that for several years. He is wondering if he needs to have another sleep study. He denies any strokelike symptoms. Any new neurological symptoms. Denies any new medical issues. He returns today for follow-up. Update 06/11/2015 ; he returns for follow-up after last visit in October 2016.accompanied by his  wife. Convinced to do well without recurrent stroke or TIA symptoms. He states his blood pressure is quite well controlled at home but today it is elevated at 155/65 office. He remains on aspirin which is tolerating well without bleeding or bruising. He is on Zocor without side effects and last lipid profile was satisfactory. He has no new complaints today. He recently went on trip to [place] and it went well. Update 08/05/2016 ; he returns for follow-up after last visit a year ago. He is accompanied by his wife. He continues to do well without recurrent stroke or TIA symptoms since 2011. He still has peripheral vision loss on the left but at last visit with eye doctor a month ago he had noticed improvement. He has also developed in turning  lower eyelid on the left and has had a stitch and has a tape to straighten it out. He was admitted in February for 5 days to the hospital with a urinary tract infection. He states overall he has poor energy and gets tired easily. His blood pressure is well controlled though today it is slightly elevated at 156/62 in office. He remains on aspirin which is tolerating well without bruising or bleeding. His tolerating Zocor well without side effects and last about  profile checked by primary physician was satisfactory. His   memory difficulties are  unchanged. He has no new neurological complaints. REVIEW OF SYSTEMS: Out of a complete 14 system review of symptoms, the patient complains only of the following symptoms, and all other reviewed systems are negative.  Recent UTI, vision defect and all other systems negative  ALLERGIES: No Known Allergies  HOME MEDICATIONS: Outpatient Medications Prior to Visit  Medication Sig Dispense Refill  . aspirin 81 MG tablet Take 81 mg by mouth daily.    . fexofenadine (ALLEGRA) 30 MG tablet Take 30 mg by mouth 2 (two) times daily.    . hydrochlorothiazide (HYDRODIURIL) 25 MG tablet Take 1.5 tablets (37.5 mg total) by mouth daily. 45 tablet 1  . losartan (COZAAR) 100 MG tablet Take 1 tablet (100 mg total) by mouth daily. 30 tablet 0  . metoprolol (LOPRESSOR) 50 MG tablet Take 50 mg by mouth daily.     . Probiotic Product (PROBIOTIC DAILY PO) Take 1 capsule by mouth daily.     . ranitidine (ZANTAC) 150 MG tablet Take 150 mg by mouth 2 (two) times daily.    . simvastatin (ZOCOR) 40 MG tablet Take 40 mg by mouth daily.    . Travoprost, BAK Free, (TRAVATAN) 0.004 % SOLN ophthalmic solution Place 1 drop into both eyes at bedtime.    Marland Kitchen guaiFENesin-dextromethorphan (ROBITUSSIN DM) 100-10 MG/5ML syrup Take 5 mLs by mouth every 4 (four) hours as needed for cough. (Patient not taking: Reported on 08/05/2016) 118 mL 0   No facility-administered medications prior to visit.     PAST MEDICAL HISTORY: Past Medical History:  Diagnosis Date  . CVA (cerebral infarction) 12/2013  . Hypertension   . Sleep difficulties   . Stroke [facility])     PAST SURGICAL HISTORY: Past Surgical History:  Procedure Laterality Date  . ABDOMINAL SURGERY    . CATARACT EXTRACTION Left     FAMILY HISTORY: Family History  Problem Relation Age of Onset  . Hyperlipidemia Mother   . Hypertension Mother   . Hypertension Father     SOCIAL  HISTORY: Social History   Social History  . Marital status: Married    Spouse name: [name]  . Number of children: 2  . Years of education: college   Occupational History  .      retired   Social History Main Topics  . Smoking status: Never Smoker  . Smokeless tobacco: Never Used  . Alcohol use No  . Drug use: No  . Sexual activity: Not on file   Other Topics Concern  . Not on file   Social History Narrative   Patient lives at home with his wife [name].   Retired.   Education Retail buyer.   Right handed.   Caffeine two cups of coffee daily.      PHYSICAL EXAM  Vitals:   08/05/16 1541  BP: (!) 156/62  Pulse: (!) 48  Weight: 167 lb 12.8 oz (76.1 kg)  Height: 5\' 5"  (  1.651 m)   Body mass index is 27.92 kg/m.  Generalized:  Pleasant slightly overweight Asian elderly male not in distress.  . Afebrile. Head is nontraumatic. Neck is supple without bruit.    Cardiac exam no murmur or gallop. Lungs are clear to auscultation. Distal pulses are well felt.  Neurological examination  Mentation: Alert oriented to time, place, history taking. Follows all commands speech and language fluent Cranial nerve II-XII: Pupils were equal round reactive to light. Extraocular movements were full, visual fields show partial left hemianopsia on confrontational test. Facial sensation and strength were normal. Uvula tongue midline. Head turning and shoulder shrug  were normal and symmetric. Motor: The motor testing reveals 5 over 5 strength of all 4 extremities. Good symmetric motor tone is noted throughout.  Sensory: Sensory testing is intact to soft touch on all 4 extremities. No evidence of extinction is noted.  Coordination: Cerebellar testing reveals good finger-nose-finger and heel-to-shin bilaterally.  Gait and station: Gait is normal. Tandem gait is unsteady. Romberg is negative. No drift is seen.  Reflexes: Deep tendon reflexes are symmetric and normal bilaterally.   DIAGNOSTIC  DATA (LABS, IMAGING, TESTING) - I reviewed patient records, labs, notes, testing and imaging myself where available.  Lab Results  Component Value Date   WBC 10.7 (H) 03/10/2016   HGB 12.1 (L) 03/10/2016   HCT 37.8 (L) 03/10/2016   MCV 93.1 03/10/2016   PLT 229 03/10/2016      Component Value Date/Time   NA 137 03/10/2016 0424   K 3.7 03/10/2016 0424   CL 101 03/10/2016 0424   CO2 24 03/10/2016 0424   GLUCOSE 104 (H) 03/10/2016 0424   BUN 37 (H) 03/10/2016 0424   CREATININE 1.32 (H) 03/10/2016 0424   CALCIUM 8.6 (L) 03/10/2016 0424   PROT 7.0 03/09/2016 0512   ALBUMIN 3.4 (L) 03/09/2016 0512   AST 26 03/09/2016 0512   ALT 19 03/09/2016 0512   ALKPHOS 38 03/09/2016 0512   BILITOT 0.7 03/09/2016 0512   GFRNONAA 49 (L) 03/10/2016 0424   GFRAA 56 (L) 03/10/2016 0424   Lab Results  Component Value Date   CHOL 147 12/15/2013   HDL 54 12/15/2013   LDLCALC 76 12/15/2013   TRIG 84 12/15/2013   CHOLHDL 2.7 12/15/2013   Lab Results  Component Value Date   HGBA1C 6.4 (H) 12/15/2013   Lab Results  Component Value Date   TIWPYKDX83 382 12/15/2013   Lab Results  Component Value Date   TSH 1.820 12/15/2013      ASSESSMENT AND PLAN 83 y.o. year old male  has a past medical history of CVA (cerebral infarction) (12/2013); Hypertension; Sleep difficulties; and Stroke [facility]). here with:  1. History of CVA 2. OSA    I had a long d/w patient and his wife about his remote brain hemorrhage, risk for recurrent stroke/TIAs, personally independently reviewed imaging studies and stroke evaluation results and answered questions.Continue aspirin 81 mg daily  for secondary stroke prevention and maintain strict control of hypertension with blood pressure goal below 130/90,   and lipids with LDL cholesterol goal below 70 mg/dL. since it has been several years since his intracerebral hemorrhage do not believe scheduled routine follow-up appointment with me is necessary. He may be referred  back to me by his primary care physician in the future as needed.Greater than 50% time during this 25 minute visit was spent on counseling and coordination of care about his remote stroke, mild memory impairment and answering questions Diona Peregoy  Leonie Man, MD  08/05/2016, 4:14 PM [facility] [address] [place], [place] [address] [phone]   I reviewed the above note and documentation by the Nurse Practitioner and agree with the history, physical exam, assessment and plan as outlined above. I was immediately available for face-to-face consultation. Star Age, MD, PhD [facility])

## 2016-08-05 NOTE — Patient Instructions (Signed)
I had a long d/w patient and his wife about his remote brain hemorrhage, risk for recurrent stroke/TIAs, personally independently reviewed imaging studies and stroke evaluation results and answered questions.Continue aspirin 81 mg daily  for secondary stroke prevention and maintain strict control of hypertension with blood pressure goal below 130/90,   and lipids with LDL cholesterol goal below 70 mg/dL. since it has been several years since his intracerebral hemorrhage do not believe scheduled routine follow-up appointment with me is necessary. He may be referred back to me by his primary care physician in the future as needed.

## 2016-10-12 ENCOUNTER — Encounter: Payer: Self-pay | Admitting: Neurology

## 2016-10-14 ENCOUNTER — Ambulatory Visit (INDEPENDENT_AMBULATORY_CARE_PROVIDER_SITE_OTHER): Payer: Medicare Other | Admitting: Neurology

## 2016-10-14 ENCOUNTER — Encounter: Payer: Self-pay | Admitting: Neurology

## 2016-10-14 VITALS — BP 150/69 | HR 60 | Ht 65.0 in | Wt 166.0 lb

## 2016-10-14 DIAGNOSIS — G4733 Obstructive sleep apnea (adult) (pediatric): Secondary | ICD-10-CM | POA: Diagnosis not present

## 2016-10-14 DIAGNOSIS — Z9989 Dependence on other enabling machines and devices: Secondary | ICD-10-CM | POA: Diagnosis not present

## 2016-10-14 NOTE — Progress Notes (Signed)
Subjective:    Patient ID: Brian Wolf is a 83 y.o. male.  HPI     Interim history:   Brian Wolf is a very friendly 83 year old right-handed gentleman with an underlying medical history of hypertension, overweight state, and stroke secondary to right parietal intracerebral hemorrhage with residual left homonymous hemianopsia, who presents for follow-up consultation of his obstructive sleep apnea, on CPAP therapy. The patient is accompanied by his wife today. I last saw him on 10/14/2015, at which time he was fully compliant with CPAP therapy. He had some recent stressors. He was not as motivated per wife. He was spending a lot of time in bed. He was trying to do some exercises that he was taught by the therapist in the past. I suggested a one-year checkup for sleep apnea.   Today, 10/14/2016 (all dictated new, as well as above notes, some dictation done in note pad or Word, outside of chart, may appear as copied):   I reviewed his CPAP compliance data from 09/13/2016 through 10/12/2016 which is a total of 30 days, during which time he used his CPAP 29 days with percent used days greater than 4 hours at 77%, indicating adequate compliance with an average usage of 4 hours and 59 minutes, residual AHI low at 1.4 per hour, leak acceptable with the 95th percentile at 12.8 L/m on a pressure of 6 cm with EPR of 1. He reports doing okay, sometimes, does not use the machine when there is thunder. Of note, he was hospitalized earlier this year in February after a fall. I reviewed the hospital records including discharge summary. He was suspected to have sustained a new subarachnoid hemorrhage but it was not fully clear. EEG was negative for seizures. He was diagnosed and treated for UTI. He had a follow-up in the interim with Dr. Leonie Man in July 2018.    The patient's allergies, current medications, family history, past medical history, past social history, past surgical history and problem list were reviewed  and updated as appropriate.   Previously (copied from previous notes for reference):   I saw him on 04/03/2015, at which time we talked about his sleep study results. We also went over his compliance data. He was fully compliant with treatment. He reported doing better with CPAP, feeling better rested. I suggested a six-month follow-up.    I reviewed his CPAP compliance data from 09/13/2015 through 10/12/2015, which is a total of 30 days, during which time he used his machine every night with percent used days greater than 4 hours at 100%, indicating superb compliance with an average usage of 9 hours and 47 minutes, residual AHI at 1.4 per hour, leak low for the 95th percentile at 14.1 L/m on a pressure of 6 cm.   I first met him on 06/04/2014 at the request of Ward Givens and Dr. Leonie Man, at which time the patient reported a prior diagnosis of obstructive sleep apnea but he had not used CPAP in many years. I invited him back for sleep study. He had a split-night sleep study on 07/24/2014. I went over his test results with him in detail today. Sleep efficiency at baseline was 66.4% with a latency to sleep of 24.5 minutes and wake after sleep onset of 30.5 minutes with moderate sleep fragmentation noted. Baseline arousal index was elevated. He had an increased percentage of stage II sleep, slow-wave sleep was 7.8% and REM sleep was 3.7%. REM latency was normal. HeReporno had moderate PLMS with minimal arousals. He  had no significant EKG or EEG changes. Mild to moderate snoring was noted. Total AHI was 27.1 per hour, average oxygen saturation was 94%, nadir was 81%. She was then titrated on CPAP. Sleep efficiency was 74.6% with a latency to sleep of 49.5 minutes and wake after sleep onset was 15 minutes with mild sleep fragmentation noted. He had a normal arousal index. He had an increased percentage of stage II sleep, slow-wave sleep at 16.5% in rem sleep was 14.5%. Average oxygen saturation was 94%, nadir  was 89%. He had severe PLMS during the treatment portion of the study but minimal arousals. CPAP was titrated from a pressure of 5 cm to 6 cm. AHI was 1 per hour on the final pressure with supine REM sleep achieved. Based on his test results and in particular in light of his medical history of prescribed CPAP therapy for home use.    I reviewed his CPAP compliance data from 03/03/2015 through 04/01/2015 which is a total of 30 days during which time he used his machine every night with percent used days greater than 4 hours at 100%, indicating superb compliance with an average usage of 8 hours and 41 minutes, residual AHI at 2.5 per hour, leak acceptable for the 95th percentile at 15.5 L/m on a pressure of 6 cm.    06/04/2014: He reports a prior diagnosis of obstructive sleep apnea. He was given a CPAP machine but has not used it in years. In fact, he does not currently have a CPAP machine. His sleep study may have been 35 years ago or so. He came to the [place] at the age of 47 from [place]. 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 He also has cataracts. He had cataract extraction to the left eye in October but then suffered a stroke in November 2015 and his right eye cataract surgery was postponed from December 2 later this month. He has an appointment on 06/18/2014 for cataract surgery but would like to see if he can have a sleep study first. He would be willing to try CPAP again. He does snore. He goes to bed late, around 1 AM typically. He sleeps till 9:30 or 10 or so. He drinks 2 cups of coffee in the morning. He does not smoke. He does not drink alcohol. He likes to cook. He is currently not driving. He is walking without a cane or any walking aid. His metoprolol was reduced recently because of low pulse rate. His blood pressure has crept up a little bit. He takes every day. He has not had any new neurological symptoms. He typically does not have a  headache but at the time of his stroke diagnosis he had a right parietal headache, especially when coughing. I reviewed hospital records from his admission in November 2015. He was admitted on 12/14/2013 through 12/20/2013 and discharged to rehabilitation for he stayed from 12/20/2013 through 12/26/2013. He had an MRI brain without contrast on 12/17/2013: Large RIGHT temporal occipital lobar hemorrhage with surrounding edema. Approximate volume 21 mL. Probable trace subarachnoid and intraventricular blood. Probable lobar hypertensive bleed. Cerebral amyloid angiopathy not excluded. No obvious underlying mass or vascular malformation.   Subcentimeter focus of acute infarction RIGHT lateral temporal lobe, remote from the hemorrhage.   No intracranial flow limiting stenosis or vascular malformation.   He has a family history of stroke. He's not sure if there is a family history of obstructive sleep  apnea. He has 2 grown children from his first marriage. He has been married for 29 years to his current wife.   His Epworth sleepiness score is 22 out of 24 today and fatigue score is 55, both elevated.  His Past Medical History Is Significant For: Past Medical History:  Diagnosis Date  . CVA (cerebral infarction) 12/2013  . Hypertension   . Sleep difficulties   . Stroke [facility])     His Past Surgical History Is Significant For: Past Surgical History:  Procedure Laterality Date  . ABDOMINAL SURGERY    . CATARACT EXTRACTION Left     His Family History Is Significant For: Family History  Problem Relation Age of Onset  . Hyperlipidemia Mother   . Hypertension Mother   . Hypertension Father     His Social History Is Significant For: Social History   Social History  . Marital status: Married    Spouse name: Brian Wolf  . Number of children: 2  . Years of education: college   Occupational History  .      retired   Social History Main Topics  . Smoking status: Never Smoker  .  Smokeless tobacco: Never Used  . Alcohol use No  . Drug use: No  . Sexual activity: Not Asked   Other Topics Concern  . None   Social History Narrative   Patient lives at home with his wife Brian Wolf.   Retired.   Education Retail buyer.   Right handed.   Caffeine two cups of coffee daily.    His Allergies Are:  No Known Allergies:   His Current Medications Are:  Outpatient Encounter Prescriptions as of 10/14/2016  Medication Sig  . aspirin 81 MG tablet Take 81 mg by mouth daily.  . fexofenadine (ALLEGRA) 30 MG tablet Take 30 mg by mouth 2 (two) times daily.  . hydrochlorothiazide (HYDRODIURIL) 25 MG tablet Take 1.5 tablets (37.5 mg total) by mouth daily.  . metoprolol (LOPRESSOR) 50 MG tablet Take 50 mg by mouth daily.   . Probiotic Product (PROBIOTIC DAILY PO) Take 1 capsule by mouth daily.   . ranitidine (ZANTAC) 150 MG tablet Take 150 mg by mouth 2 (two) times daily.  . simvastatin (ZOCOR) 40 MG tablet Take 40 mg by mouth daily.  . tamsulosin (FLOMAX) 0.4 MG CAPS capsule   . Travoprost, BAK Free, (TRAVATAN) 0.004 % SOLN ophthalmic solution Place 1 drop into both eyes at bedtime.  Marland Kitchen losartan (COZAAR) 100 MG tablet Take 1 tablet (100 mg total) by mouth daily.   No facility-administered encounter medications on file as of 10/14/2016.   :  Review of Systems:  Out of a complete 14 point review of systems, all are reviewed and negative with the exception of these symptoms as listed below: Review of Systems  Neurological:       Pt presents today to discuss his cpap. Pt's wife reports that he is using his cpap compliantly.    Objective:  Neurological Exam  Physical Exam Physical Examination:   Vitals:   10/14/16 1000  BP: (!) 150/69  Pulse: 60   General Examination: The patient is a very pleasant 83 y.o. male in no acute distress. He appears well-developed and well-nourished and well groomed. Good spirits.   HEENT: Normocephalic, atraumatic, pupils are equal, round  and reactive to light and accommodation. Small surgical tape under his left eyelid because of turning in of the lower left eyelid. He may need surgery for this. Extraocular tracking is good without  limitation to gaze excursion or nystagmus noted. Normal smooth pursuit is noted. Hearing is grossly intact. Face is symmetric with normal facial animation and normal facial sensation. Speech is clear with no dysarthria noted. There is no hypophonia. There is no lip, neck/head, jaw or voice tremor. Neck is supple with full range of passive and active motion. There are no carotid bruits on auscultation. Oropharynx exam reveals: mild mouth dryness, adequate dental hygiene and moderate airway crowding, due to redundant soft palate. Mallampati is class III. Tongue protrudes centrally and palate elevates symmetrically. Tonsils are absent.    Chest: Clear to auscultation without wheezing, but mild bibasilar crackles noted.  Heart: S1+S2+0, regular and normal without murmurs, rubs or gallops noted.   Abdomen: Soft, non-tender and non-distended with normal bowel sounds appreciated on auscultation.  Extremities: There is no pitting edema in the distal lower extremities bilaterally.   Skin: Warm and dry without trophic changes noted. There are no varicose veins.  Musculoskeletal: exam reveals no obvious joint deformities, tenderness or joint swelling or erythema.   Neurologically:  Mental status: The patient is awake, alert and oriented in all 4 spheres. His immediate and remote memory, attention, language skills and fund of knowledge are appropriate. There is no evidence of aphasia, agnosia, apraxia or anomia. Speech is clear with normal prosody and enunciation. Thought process is linear. Mood is normal and affect is normal.  Cranial nerves II - XII are as described above under HEENT exam. In addition: shoulder shrug is normal with equal shoulder height noted. Motor exam: Normal bulk, strength and tone is  noted. There is no drift, tremor or rebound. Romberg is negative. Reflexes are 2+ throughout. Fine motor skills and coordination: intact with normal finger taps, normal hand movements, normal rapid alternating patting, normal foot taps and normal foot agility.  Cerebellar testing: No dysmetria or intention tremor. Sensory exam: intact to light touch in the UEs and LEs.   Gait, station and balance: He stands easily. No veering to one side is noted. No leaning to one side is noted. Posture is age-appropriate and stance is narrow based. Gait shows mild gait insecurity, not able to do tandem walk.  Assessment and Plan:  In summary, Brian Wolf is a very pleasant 83 year old male with an underlying medical history of hypertension, overweight state, and stroke secondary to right parietal intracerebral hemorrhage with residual left homonymous hemianopsia, who presents for follow up consultation of his obstructive sleep apnea, well established on CPAP therapy. In the past, he was diagnosed with OSA but stopped using his machine. He had a split-night sleep study in June 2016. He did well with CPAP of 6 cm and has been compliant ever since. His wife reports that he does not tend to use his machine when there is a 31st on. Other than that he is compliant with treatment and has ongoing good results. He had a fall earlier this year and has recuperated well thankfully. He is advised to continue with his current treatment settings and has received updated supplies from his DME company. I updated his CPAP supply order and we will send to his DME company[facility]. I suggested a one-year recheck, sooner as needed. I answered all their questions today and the patient and his wife were in agreement.  I spent 25 minutes in total face-to-face time with the patient, more than 50% of which was spent in counseling and coordination of care, reviewing test results, reviewing medication and discussing or reviewing the  diagnosis of OSA, its prognosis and treatment options. Pertinent laboratory and imaging test results that were available during this visit with the patient were reviewed by me and considered in my medical decision making (see chart for details).

## 2016-10-14 NOTE — Patient Instructions (Addendum)

## 2017-10-12 ENCOUNTER — Encounter: Payer: Self-pay | Admitting: Neurology

## 2017-10-17 ENCOUNTER — Encounter: Payer: Self-pay | Admitting: Neurology

## 2017-10-17 ENCOUNTER — Ambulatory Visit: Payer: Medicare Other | Admitting: Neurology

## 2017-10-17 VITALS — BP 184/73 | HR 47 | Ht 63.0 in | Wt 161.0 lb

## 2017-10-17 DIAGNOSIS — G4733 Obstructive sleep apnea (adult) (pediatric): Secondary | ICD-10-CM | POA: Diagnosis not present

## 2017-10-17 DIAGNOSIS — Z9989 Dependence on other enabling machines and devices: Secondary | ICD-10-CM

## 2017-10-17 NOTE — Progress Notes (Signed)
Subjective:    Patient ID: Brian Wolf is a 84 y.o. male.  HPI     Interim history:   Brian Wolf is a very friendly 84 year old right-handed gentleman with an underlying medical history of hypertension, overweight state, and stroke secondary to right parietal intracerebral hemorrhage with residual left homonymous hemianopsia, who presents for follow-up consultation of his obstructive sleep apnea, on CPAP therapy. The patient is accompanied by his wife today. I last saw him on 10/14/2016, at which time he was compliant with his CPAP. He would not use it when there was thunder outside. He had a fall in February 2018 for which he was hospitalized he had an EEG which was negative for any seizure activity, he was suspected to have had a subarachnoid hemorrhage but it was not fully clear. He had a follow-up in stroke clinic in July 2018 in the interim.  Today, 10/17/2017:   I reviewed his CPAP compliance data from 09/13/2017 through 10/12/2017 which is a total of 30 days, during which time he used his CPAP 29 days with percent used days greater than 4 hours at 53%, indicating suboptimal compliance with an average usage of 4 hours and 24 minutes, residual AHI 1.6 per hour, leak on the higher end of acceptable at 16.8 L/m for the 95th percentile with a CPAP pressure of 6 cm. In the past 90 days his compliance was less than this. He reports doing okay, his wife adds that he does have a tendency to not put his CPAP back on after he comes back from his bathroom break on a typical night. He may need to replace some of his supplies they both admit. He does have new supplies available. His DME company is [facility]. He tries to exercise, typically works in the yard when possible. He tries to hydrate well. He is not much of a breakfast eater. He has lost a little bit of weight on purpose.   The patient's allergies, current medications, family history, past medical history, past social history, past  surgical history and problem list were reviewed and updated as appropriate.    Previously:    I saw him on 10/14/2015, at which time he was fully compliant with CPAP therapy. He had some recent stressors. He was not as motivated per wife. He was spending a lot of time in bed. He was trying to do some exercises that he was taught by the therapist in the past. I suggested a one-year checkup for sleep apnea.    I reviewed his CPAP compliance data from 09/13/2016 through 10/12/2016 which is a total of 30 days, during which time he used his CPAP 29 days with percent used days greater than 4 hours at 77%, indicating adequate compliance with an average usage of 4 hours and 59 minutes, residual AHI low at 1.4 per hour, leak acceptable with the 95th percentile at 12.8 L/m on a pressure of 6 cm with EPR of 1.     I saw him on 04/03/2015, at which time we talked about his sleep study results. We also went over his compliance data. He was fully compliant with treatment. He reported doing better with CPAP, feeling better rested. I suggested a six-month follow-up.    I reviewed his CPAP compliance data from 09/13/2015 through 10/12/2015, which is a total of 30 days, during which time he used his machine every night with percent used days greater than 4 hours at 100%, indicating superb compliance with an average usage  of 9 hours and 47 minutes, residual AHI at 1.4 per hour, leak low for the 95th percentile at 14.1 L/m on a pressure of 6 cm.   I first met him on 06/04/2014 at the request of Ward Givens and Dr. Leonie Man, at which time the patient reported a prior diagnosis of obstructive sleep apnea but he had not used CPAP in many years. I invited him back for sleep study. He had a split-night sleep study on 07/24/2014. I went over his test results with him in detail today. Sleep efficiency at baseline was 66.4% with a latency to sleep of 24.5 minutes and wake after sleep onset of 30.5 minutes with moderate sleep  fragmentation noted. Baseline arousal index was elevated. He had an increased percentage of stage II sleep, slow-wave sleep was 7.8% and REM sleep was 3.7%. REM latency was normal. HeReporno had moderate PLMS with minimal arousals. He had no significant EKG or EEG changes. Mild to moderate snoring was noted. Total AHI was 27.1 per hour, average oxygen saturation was 94%, nadir was 81%. She was then titrated on CPAP. Sleep efficiency was 74.6% with a latency to sleep of 49.5 minutes and wake after sleep onset was 15 minutes with mild sleep fragmentation noted. He had a normal arousal index. He had an increased percentage of stage II sleep, slow-wave sleep at 16.5% in rem sleep was 14.5%. Average oxygen saturation was 94%, nadir was 89%. He had severe PLMS during the treatment portion of the study but minimal arousals. CPAP was titrated from a pressure of 5 cm to 6 cm. AHI was 1 per hour on the final pressure with supine REM sleep achieved. Based on his test results and in particular in light of his medical history of prescribed CPAP therapy for home use.    I reviewed his CPAP compliance data from 03/03/2015 through 04/01/2015 which is a total of 30 days during which time he used his machine every night with percent used days greater than 4 hours at 100%, indicating superb compliance with an average usage of 8 hours and 41 minutes, residual AHI at 2.5 per hour, leak acceptable for the 95th percentile at 15.5 L/m on a pressure of 6 cm.    06/04/2014: He reports a prior diagnosis of obstructive sleep apnea. He was given a CPAP machine but has not used it in years. In fact, he does not currently have a CPAP machine. His sleep study may have been 35 years ago or so. He came to the [place] at the age of 81 from [place]. 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 He also has cataracts. He had cataract extraction to the left eye in October but then suffered a  stroke in November 2015 and his right eye cataract surgery was postponed from December 2 later this month. He has an appointment on 06/18/2014 for cataract surgery but would like to see if he can have a sleep study first. He would be willing to try CPAP again. He does snore. He goes to bed late, around 1 AM typically. He sleeps till 9:30 or 10 or so. He drinks 2 cups of coffee in the morning. He does not smoke. He does not drink alcohol. He likes to cook. He is currently not driving. He is walking without a cane or any walking aid. His metoprolol was reduced recently because of low pulse rate. His blood pressure has crept up a little bit.  He takes every day. He has not had any new neurological symptoms. He typically does not have a headache but at the time of his stroke diagnosis he had a right parietal headache, especially when coughing. I reviewed hospital records from his admission in November 2015. He was admitted on 12/14/2013 through 12/20/2013 and discharged to rehabilitation for he stayed from 12/20/2013 through 12/26/2013. He had an MRI brain without contrast on 12/17/2013: Large RIGHT temporal occipital lobar hemorrhage with surrounding edema. Approximate volume 21 mL. Probable trace subarachnoid and intraventricular blood. Probable lobar hypertensive bleed. Cerebral amyloid angiopathy not excluded. No obvious underlying mass or vascular malformation.   Subcentimeter focus of acute infarction RIGHT lateral temporal lobe, remote from the hemorrhage.   No intracranial flow limiting stenosis or vascular malformation.   He has a family history of stroke. He's not sure if there is a family history of obstructive sleep apnea. He has 2 grown children from his first marriage. He has been married for 29 years to his current wife.   His Epworth sleepiness score is 22 out of 24 today and fatigue score is 55, both elevated.  His Past Medical History Is Significant For: Past Medical History:   Diagnosis Date  . CVA (cerebral infarction) 12/2013  . Hypertension   . Sleep difficulties   . Stroke [facility])     His Past Surgical History Is Significant For: Past Surgical History:  Procedure Laterality Date  . ABDOMINAL SURGERY    . CATARACT EXTRACTION Left     His Family History Is Significant For: Family History  Problem Relation Age of Onset  . Hyperlipidemia Mother   . Hypertension Mother   . Hypertension Father     His Social History Is Significant For: Social History   Socioeconomic History  . Marital status: Married    Spouse name: [name]  . Number of children: 2  . Years of education: college  . Highest education level: Not on file  Occupational History    Comment: retired  Scientific laboratory technician  . Financial resource strain: Not on file  . Food insecurity:    Worry: Not on file    Inability: Not on file  . Transportation needs:    Medical: Not on file    Non-medical: Not on file  Tobacco Use  . Smoking status: Never Smoker  . Smokeless tobacco: Never Used  Substance and Sexual Activity  . Alcohol use: No    Alcohol/week: 0.0 standard drinks  . Drug use: No  . Sexual activity: Not on file  Lifestyle  . Physical activity:    Days per week: Not on file    Minutes per session: Not on file  . Stress: Not on file  Relationships  . Social connections:    Talks on phone: Not on file    Gets together: Not on file    Attends religious service: Not on file    Active member of club or organization: Not on file    Attends meetings of clubs or organizations: Not on file    Relationship status: Not on file  Other Topics Concern  . Not on file  Social History Narrative   Patient lives at home with his wife [name].   Retired.   Education Retail buyer.   Right handed.   Caffeine two cups of coffee daily.    His Allergies Are:  No Known Allergies:   His Current Medications Are:  Outpatient Encounter Medications as of 10/17/2017  Medication Sig  .  aspirin  81 MG tablet Take 81 mg by mouth daily.  . Cyanocobalamin (VITAMIN B-12 PO) Take by mouth.  . fexofenadine (ALLEGRA) 30 MG tablet Take 30 mg by mouth 2 (two) times daily.  . hydrochlorothiazide (HYDRODIURIL) 25 MG tablet Take 1.5 tablets (37.5 mg total) by mouth daily.  . metoprolol (LOPRESSOR) 50 MG tablet Take 50 mg by mouth daily.   . Probiotic Product (PROBIOTIC DAILY PO) Take 1 capsule by mouth daily.   . ranitidine (ZANTAC) 150 MG tablet Take 150 mg by mouth 2 (two) times daily.  . simvastatin (ZOCOR) 40 MG tablet Take 40 mg by mouth daily.  . tamsulosin (FLOMAX) 0.4 MG CAPS capsule   . Travoprost, BAK Free, (TRAVATAN) 0.004 % SOLN ophthalmic solution 1 drop at bedtime.  . [DISCONTINUED] Travoprost, BAK Free, (TRAVATAN) 0.004 % SOLN ophthalmic solution Place 1 drop into both eyes at bedtime.  Marland Kitchen losartan (COZAAR) 100 MG tablet Take 1 tablet (100 mg total) by mouth daily.   No facility-administered encounter medications on file as of 10/17/2017.   :  Review of Systems:  Out of a complete 14 point review of systems, all are reviewed and negative with the exception of these symptoms as listed below:  Review of Systems  Neurological:       Pt presents today to discuss his cpap. Pt reports that his cpap is going well.    Objective:  Neurological Exam  Physical Exam Physical Examination:   Vitals:   10/17/17 1034  BP: (!) 184/73  Pulse: (!) 47   General Examination: The patient is a very pleasant 84 y.o. male in no acute distress. He appears well-developed and well-nourished and well groomed.   HEENT:Normocephalic, atraumatic, pupils are equal, round and reactive to light and accommodation. Small surgical tape under his left eyelid because of turning in of the lower left eyelid. He may need surgery for this. Extraocular tracking is good without limitation to gaze excursion or nystagmus noted. Normal smooth pursuit is noted. Hearing is grossly intact. Face is symmetric with normal  facial animation and normal facial sensation. Speech is clear with no dysarthria noted. There is no hypophonia. There is no lip, neck/head, jaw or voice tremor. Neck is supple with full range of passive and active motion. There are no carotid bruits on auscultation. Oropharynx exam reveals: mild mouth dryness, adequate dental hygiene and moderate airway crowding, due to redundant soft palate. Mallampati is class III. Tongue protrudes centrally and palate elevates symmetrically. Tonsils are absent.    Chest:Clear to auscultation without wheezing or crackles noted.  Heart:S1+S2+0, regular and normal without murmurs, rubs or gallops noted.   Abdomen:Soft, non-tender and non-distended.  Extremities:There is no pitting edema in the distal lower extremities bilaterally.   Skin: Warm and dry without trophic changes noted.   Musculoskeletal: exam reveals no obvious joint deformities, tenderness or joint swelling or erythema.   Neurologically:  Mental status: The patient is awake, alert and oriented in all 4 spheres. His immediate and remote memory, attention, language skills and fund of knowledge are appropriate. There is no evidence of aphasia, agnosia, apraxia or anomia. Speech is clear with normal prosody and enunciation. Thought process is linear. Mood is normal and affect is normal.  Cranial nerves II - XII are as described above under HEENT exam. Motor exam: Normal bulk, strength and tone is noted. There is no drift, tremor or rebound. Reflexes are 1+ throughout. Fine motor skills and coordination: intact with normal finger taps, normal hand movements,  normal rapid alternating patting, normal foot taps and normal foot agility.  Cerebellar testing: No dysmetria or intention tremor. Sensory exam: intact to light touch in the UEs and LEs.   Gait, station and balance: He stands easily. No veering to one side is noted. No leaning to one side is noted. Posture is age-appropriate and stance is  narrow based. Gait shows mild gait insecurity, not able to do tandem walk.  Assessment and Plan:  In summary, Brian Wolf is a very pleasant 84 year old male with an underlying medical history of hypertension, overweight state, and stroke secondary to right parietal intracerebral hemorrhage with residual left homonymous hemianopsia, who presents for follow up consultation of his obstructive sleep apnea, well established on CPAP therapy. In the past, he was diagnosed with OSA but stopped using his machine. He had a split-night sleep study in June 2016. He did well with CPAP of 6 cm and has been compliant ever since. compared to last year he has had a slight decline in his overall usage, averaging little over 4 hours, whereas he was closer to 5 hours last year around this time. His wife reports that he has a tendency to not put the mask back on after he has a bathroom break in the middle of the night. Nevertheless, he is motivated to continue with treatment and agreeable to trying to be more consistent with his CPAP usage. He is advised to follow-up routinely in one year, he can see Vaughan Browner next time. I answered all their questions today and the patient and his wife were in agreement.  I spent 20 minutes in total face-to-face time with the patient, more than 50% of which was spent in counseling and coordination of care, reviewing test results, reviewing medication and discussing or reviewing the diagnosis of OSA, its prognosis and treatment options. Pertinent laboratory and imaging test results that were available during this visit with the patient were reviewed by me and considered in my medical decision making (see chart for details).

## 2017-10-17 NOTE — Patient Instructions (Addendum)
You have continued to do well on CPAP, please be sure to change your filter every 1 month, your mask about every 1-2 months, hose about 6 months, humidifier chamber about yearly. Some restrictions are imposed by her insurance carrier with regard to how frequently you can get certain supplies.  Please continue using your CPAP regularly. While your insurance requires that you use CPAP at least 4 hours each night on 70% of the nights, I recommend, that you not skip any nights and use it throughout the night if you can. Getting used to CPAP and staying with the treatment long term does take time and patience and discipline. Untreated obstructive sleep apnea when it is moderate to severe can have an adverse impact on cardiovascular health and raise her risk for heart disease, arrhythmias, hypertension, congestive heart failure, stroke and diabetes. Untreated obstructive sleep apnea causes sleep disruption, nonrestorative sleep, and sleep deprivation. This can have an impact on your day to day functioning and cause daytime sleepiness and impairment of cognitive function, memory loss, mood disturbance, and problems focussing. Using CPAP regularly can improve these symptoms.  We can see you in 1 year, you can Ward Givens next time.

## 2018-02-20 DIAGNOSIS — Z961 Presence of intraocular lens: Secondary | ICD-10-CM | POA: Diagnosis not present

## 2018-02-20 DIAGNOSIS — H401122 Primary open-angle glaucoma, left eye, moderate stage: Secondary | ICD-10-CM | POA: Diagnosis not present

## 2018-02-20 DIAGNOSIS — H401111 Primary open-angle glaucoma, right eye, mild stage: Secondary | ICD-10-CM | POA: Diagnosis not present

## 2018-02-20 DIAGNOSIS — H04123 Dry eye syndrome of bilateral lacrimal glands: Secondary | ICD-10-CM | POA: Diagnosis not present

## 2018-05-31 DIAGNOSIS — Z0000 Encounter for general adult medical examination without abnormal findings: Secondary | ICD-10-CM | POA: Diagnosis not present

## 2018-05-31 DIAGNOSIS — E785 Hyperlipidemia, unspecified: Secondary | ICD-10-CM | POA: Diagnosis not present

## 2018-06-07 DIAGNOSIS — Z0000 Encounter for general adult medical examination without abnormal findings: Secondary | ICD-10-CM | POA: Diagnosis not present

## 2018-06-07 DIAGNOSIS — G4733 Obstructive sleep apnea (adult) (pediatric): Secondary | ICD-10-CM | POA: Diagnosis not present

## 2018-06-07 DIAGNOSIS — E785 Hyperlipidemia, unspecified: Secondary | ICD-10-CM | POA: Diagnosis not present

## 2018-06-07 DIAGNOSIS — Z8673 Personal history of transient ischemic attack (TIA), and cerebral infarction without residual deficits: Secondary | ICD-10-CM | POA: Diagnosis not present

## 2018-06-07 DIAGNOSIS — I10 Essential (primary) hypertension: Secondary | ICD-10-CM | POA: Diagnosis not present

## 2018-08-02 DIAGNOSIS — M79675 Pain in left toe(s): Secondary | ICD-10-CM | POA: Diagnosis not present

## 2018-08-02 DIAGNOSIS — M109 Gout, unspecified: Secondary | ICD-10-CM | POA: Diagnosis not present

## 2018-08-22 DIAGNOSIS — H401131 Primary open-angle glaucoma, bilateral, mild stage: Secondary | ICD-10-CM | POA: Diagnosis not present

## 2018-08-22 DIAGNOSIS — Z9889 Other specified postprocedural states: Secondary | ICD-10-CM | POA: Diagnosis not present

## 2018-08-22 DIAGNOSIS — H53462 Homonymous bilateral field defects, left side: Secondary | ICD-10-CM | POA: Diagnosis not present

## 2018-09-29 DIAGNOSIS — Z23 Encounter for immunization: Secondary | ICD-10-CM | POA: Diagnosis not present

## 2018-10-04 DIAGNOSIS — E785 Hyperlipidemia, unspecified: Secondary | ICD-10-CM | POA: Diagnosis not present

## 2018-10-04 DIAGNOSIS — D649 Anemia, unspecified: Secondary | ICD-10-CM | POA: Diagnosis not present

## 2018-10-04 DIAGNOSIS — I10 Essential (primary) hypertension: Secondary | ICD-10-CM | POA: Diagnosis not present

## 2018-10-11 DIAGNOSIS — N189 Chronic kidney disease, unspecified: Secondary | ICD-10-CM | POA: Diagnosis not present

## 2018-10-11 DIAGNOSIS — R350 Frequency of micturition: Secondary | ICD-10-CM | POA: Diagnosis not present

## 2018-10-11 DIAGNOSIS — I10 Essential (primary) hypertension: Secondary | ICD-10-CM | POA: Diagnosis not present

## 2018-10-11 DIAGNOSIS — E785 Hyperlipidemia, unspecified: Secondary | ICD-10-CM | POA: Diagnosis not present

## 2018-10-11 DIAGNOSIS — M109 Gout, unspecified: Secondary | ICD-10-CM | POA: Diagnosis not present

## 2018-10-18 ENCOUNTER — Encounter: Payer: Self-pay | Admitting: Neurology

## 2018-10-23 ENCOUNTER — Encounter: Payer: Self-pay | Admitting: Adult Health

## 2018-10-23 ENCOUNTER — Ambulatory Visit (INDEPENDENT_AMBULATORY_CARE_PROVIDER_SITE_OTHER): Payer: Medicare Other | Admitting: Adult Health

## 2018-10-23 ENCOUNTER — Other Ambulatory Visit: Payer: Self-pay

## 2018-10-23 VITALS — BP 138/76 | HR 82 | Temp 97.7°F | Ht 63.0 in | Wt 165.4 lb

## 2018-10-23 DIAGNOSIS — Z9989 Dependence on other enabling machines and devices: Secondary | ICD-10-CM | POA: Diagnosis not present

## 2018-10-23 DIAGNOSIS — G4733 Obstructive sleep apnea (adult) (pediatric): Secondary | ICD-10-CM

## 2018-10-23 NOTE — Progress Notes (Addendum)
PATIENT: Brian Wolf DOB: 06/12/1931  REASON FOR VISIT: follow up HISTORY FROM: patient  HISTORY OF PRESENT ILLNESS: Today 10/23/18:  Brian Wolf is an 85 year old male with a history of obstructive sleep apnea on CPAP.  His download indicates that he uses machine 29 out of 30 days for compliance of 97%.  He uses machine greater than 4 hours 28 days for compliance of 93%.  On average he uses his machine 9 hours and 11 minutes.  His residual AHI is 1.7 on 6 cm of water with EPR of 1.  His leak in the 95th percentile is 26.6 L/min.  He denies any new symptoms.  He returns today for evaluation.  HISTORY 10/17/2017:   I reviewed his CPAP compliance data from 09/13/2017 through 10/12/2017 which is a total of 30 days, during which time he used his CPAP 29 days with percent used days greater than 4 hours at 53%, indicating suboptimal compliance with an average usage of 4 hours and 24 minutes, residual AHI 1.6 per hour, leak on the higher end of acceptable at 16.8 L/m for the 95th percentile with a CPAP pressure of 6 cm. In the past 90 days his compliance was less than this. He reports doing okay, his wife adds that he does have a tendency to not put his CPAP back on after he comes back from his bathroom break on a typical night. He may need to replace some of his supplies they both admit. He does have new supplies available. His DME company is [facility]. He tries to exercise, typically works in the yard when possible. He tries to hydrate well. He is not much of a breakfast eater. He has lost a little bit of weight on purpose.  REVIEW OF SYSTEMS: Out of a complete 14 system review of symptoms, the patient complains only of the following symptoms, and all other reviewed systems are negative.  ALLERGIES: No Known Allergies  HOME MEDICATIONS: Outpatient Medications Prior to Visit  Medication Sig Dispense Refill  . aspirin 81 MG tablet Take 81 mg by mouth daily.    . fexofenadine  (ALLEGRA) 30 MG tablet Take 30 mg by mouth 2 (two) times daily.    Marland Kitchen losartan (COZAAR) 100 MG tablet Take 100 mg by mouth daily.    . metoprolol (LOPRESSOR) 50 MG tablet Take 50 mg by mouth daily.     . Probiotic Product (PROBIOTIC DAILY PO) Take 1 capsule by mouth daily.     . simvastatin (ZOCOR) 40 MG tablet Take 40 mg by mouth daily.    . tamsulosin (FLOMAX) 0.4 MG CAPS capsule     . Travoprost, BAK Free, (TRAVATAN) 0.004 % SOLN ophthalmic solution 1 drop at bedtime.    . Cyanocobalamin (VITAMIN B-12 PO) Take by mouth.    . hydrochlorothiazide (HYDRODIURIL) 25 MG tablet Take 1.5 tablets (37.5 mg total) by mouth daily. (Patient not taking: Reported on 10/23/2018) 45 tablet 1  . ranitidine (ZANTAC) 150 MG tablet Take 150 mg by mouth 2 (two) times daily.    Marland Kitchen losartan (COZAAR) 100 MG tablet Take 1 tablet (100 mg total) by mouth daily. 30 tablet 0   No facility-administered medications prior to visit.     PAST MEDICAL HISTORY: Past Medical History:  Diagnosis Date  . CVA (cerebral infarction) 12/2013  . Hypertension   . Sleep difficulties   . Stroke [facility])     PAST SURGICAL HISTORY: Past Surgical History:  Procedure Laterality Date  .  ABDOMINAL SURGERY    . CATARACT EXTRACTION Left     FAMILY HISTORY: Family History  Problem Relation Age of Onset  . Hyperlipidemia Mother   . Hypertension Mother   . Hypertension Father     SOCIAL HISTORY: Social History   Socioeconomic History  . Marital status: Married    Spouse name: [name]  . Number of children: 2  . Years of education: college  . Highest education level: Not on file  Occupational History    Comment: retired  Scientific laboratory technician  . Financial resource strain: Not on file  . Food insecurity    Worry: Not on file    Inability: Not on file  . Transportation needs    Medical: Not on file    Non-medical: Not on file  Tobacco Use  . Smoking status: Never Smoker  . Smokeless tobacco: Never Used  Substance and Sexual  Activity  . Alcohol use: No    Alcohol/week: 0.0 standard drinks  . Drug use: No  . Sexual activity: Not on file  Lifestyle  . Physical activity    Days per week: Not on file    Minutes per session: Not on file  . Stress: Not on file  Relationships  . Social Herbalist on phone: Not on file    Gets together: Not on file    Attends religious service: Not on file    Active member of club or organization: Not on file    Attends meetings of clubs or organizations: Not on file    Relationship status: Not on file  . Intimate partner violence    Fear of current or ex partner: Not on file    Emotionally abused: Not on file    Physically abused: Not on file    Forced sexual activity: Not on file  Other Topics Concern  . Not on file  Social History Narrative   Patient lives at home with his wife [name].   Retired.   Education Retail buyer.   Right handed.   Caffeine two cups of coffee daily.      PHYSICAL EXAM  Vitals:   10/23/18 1051  BP: 138/76  Pulse: 82  Temp: 97.7 F (36.5 C)  TempSrc: Oral  Weight: 165 lb 6.4 oz (75 kg)  Height: 5\' 3"  (1.6 m)   Body mass index is 29.3 kg/m.  Generalized: Well developed, in no acute distress  Chest: Lungs clear to auscultation bilaterally  Neurological examination  Mentation: Alert oriented to time, place, history taking. Follows all commands speech and language fluent Cranial nerve II-XII: Extraocular movements were full, visual field were full on confrontational test Head turning and shoulder shrug  were normal and symmetric. Motor: The motor testing reveals 5 over 5 strength of all 4 extremities. Good symmetric motor tone is noted throughout.  Sensory: Sensory testing is intact to soft touch on all 4 extremities. No evidence of extinction is noted.  Gait and station: Gait is normal.   DIAGNOSTIC DATA (LABS, IMAGING, TESTING) - I reviewed patient records, labs, notes, testing and imaging myself where available.   Lab Results  Component Value Date   WBC 10.7 (H) 03/10/2016   HGB 12.1 (L) 03/10/2016   HCT 37.8 (L) 03/10/2016   MCV 93.1 03/10/2016   PLT 229 03/10/2016      Component Value Date/Time   NA 137 03/10/2016 0424   K 3.7 03/10/2016 0424   CL 101 03/10/2016 0424   CO2 24 03/10/2016  0424   GLUCOSE 104 (H) 03/10/2016 0424   BUN 37 (H) 03/10/2016 0424   CREATININE 1.32 (H) 03/10/2016 0424   CALCIUM 8.6 (L) 03/10/2016 0424   PROT 7.0 03/09/2016 0512   ALBUMIN 3.4 (L) 03/09/2016 0512   AST 26 03/09/2016 0512   ALT 19 03/09/2016 0512   ALKPHOS 38 03/09/2016 0512   BILITOT 0.7 03/09/2016 0512   GFRNONAA 49 (L) 03/10/2016 0424   GFRAA 56 (L) 03/10/2016 0424   Lab Results  Component Value Date   CHOL 147 12/15/2013   HDL 54 12/15/2013   LDLCALC 76 12/15/2013   TRIG 84 12/15/2013   CHOLHDL 2.7 12/15/2013   Lab Results  Component Value Date   HGBA1C 6.4 (H) 12/15/2013   Lab Results  Component Value Date   [id number] 563 12/15/2013   Lab Results  Component Value Date   TSH 1.820 12/15/2013      ASSESSMENT AND PLAN 85 y.o. year old male  has a past medical history of CVA (cerebral infarction) (12/2013), Hypertension, Sleep difficulties, and Stroke ([place]). here with:  1. Obstructive sleep apnea on CPAP  The patient's CPAP download shows excellent compliance and good treatment of his apnea.  He is encouraged to continue using CPAP nightly and greater than 4 hours each night.  He is advised that if his symptoms worsen or he develops new symptoms he should let us know.  He will follow-up in 1 year or sooner if needed   I spent 15 minutes with the patient. 50% of this time was spent discussing CPAP download   Ward Givens, MSN, NP-C 10/23/2018, 11:09 AM [facility] [address], [place], [place] [address] [phone]  I reviewed the above note and documentation by the Nurse Practitioner and agree with the history, exam, assessment and plan as  outlined above. I was immediately available for consultation. Star Age, MD, PhD [facility])

## 2019-01-31 ENCOUNTER — Ambulatory Visit: Payer: Medicare Other | Attending: Internal Medicine

## 2019-01-31 DIAGNOSIS — Z20822 Contact with and (suspected) exposure to covid-19: Secondary | ICD-10-CM

## 2019-02-01 LAB — NOVEL CORONAVIRUS, NAA: SARS-CoV-2, NAA: NOT DETECTED

## 2019-03-01 DIAGNOSIS — H53462 Homonymous bilateral field defects, left side: Secondary | ICD-10-CM | POA: Diagnosis not present

## 2019-03-01 DIAGNOSIS — Z9889 Other specified postprocedural states: Secondary | ICD-10-CM | POA: Diagnosis not present

## 2019-03-01 DIAGNOSIS — H401131 Primary open-angle glaucoma, bilateral, mild stage: Secondary | ICD-10-CM | POA: Diagnosis not present

## 2019-03-01 DIAGNOSIS — Z961 Presence of intraocular lens: Secondary | ICD-10-CM | POA: Diagnosis not present

## 2019-03-18 ENCOUNTER — Ambulatory Visit: Payer: Medicare Other

## 2019-05-22 DIAGNOSIS — Z8673 Personal history of transient ischemic attack (TIA), and cerebral infarction without residual deficits: Secondary | ICD-10-CM | POA: Diagnosis not present

## 2019-05-22 DIAGNOSIS — R413 Other amnesia: Secondary | ICD-10-CM | POA: Diagnosis not present

## 2019-06-25 DIAGNOSIS — R7989 Other specified abnormal findings of blood chemistry: Secondary | ICD-10-CM | POA: Diagnosis not present

## 2019-06-25 DIAGNOSIS — R413 Other amnesia: Secondary | ICD-10-CM | POA: Diagnosis not present

## 2019-06-25 DIAGNOSIS — I10 Essential (primary) hypertension: Secondary | ICD-10-CM | POA: Diagnosis not present

## 2019-06-25 DIAGNOSIS — R350 Frequency of micturition: Secondary | ICD-10-CM | POA: Diagnosis not present

## 2019-06-25 DIAGNOSIS — Z8673 Personal history of transient ischemic attack (TIA), and cerebral infarction without residual deficits: Secondary | ICD-10-CM | POA: Diagnosis not present

## 2019-06-26 ENCOUNTER — Encounter (HOSPITAL_COMMUNITY): Payer: Self-pay | Admitting: Emergency Medicine

## 2019-06-26 ENCOUNTER — Other Ambulatory Visit: Payer: Self-pay

## 2019-06-26 ENCOUNTER — Emergency Department (HOSPITAL_COMMUNITY)
Admission: EM | Admit: 2019-06-26 | Discharge: 2019-06-27 | Disposition: A | Discharge status: Home / Self Care | Payer: Medicare Other | Attending: Emergency Medicine | Admitting: Emergency Medicine

## 2019-06-26 DIAGNOSIS — Z7982 Long term (current) use of aspirin: Secondary | ICD-10-CM | POA: Insufficient documentation

## 2019-06-26 DIAGNOSIS — N289 Disorder of kidney and ureter, unspecified: Secondary | ICD-10-CM | POA: Diagnosis not present

## 2019-06-26 DIAGNOSIS — Z79899 Other long term (current) drug therapy: Secondary | ICD-10-CM | POA: Diagnosis not present

## 2019-06-26 DIAGNOSIS — R609 Edema, unspecified: Secondary | ICD-10-CM | POA: Insufficient documentation

## 2019-06-26 DIAGNOSIS — I10 Essential (primary) hypertension: Secondary | ICD-10-CM | POA: Diagnosis not present

## 2019-06-26 DIAGNOSIS — D649 Anemia, unspecified: Secondary | ICD-10-CM | POA: Insufficient documentation

## 2019-06-26 DIAGNOSIS — I160 Hypertensive urgency: Secondary | ICD-10-CM | POA: Diagnosis not present

## 2019-06-26 LAB — CBC WITH DIFFERENTIAL/PLATELET
Abs Immature Granulocytes: 0.06 10*3/uL (ref 0.00–0.07)
Basophils Absolute: 0.1 10*3/uL (ref 0.0–0.1)
Basophils Relative: 1 %
Eosinophils Absolute: 0.3 10*3/uL (ref 0.0–0.5)
Eosinophils Relative: 3 %
HCT: 39.4 % (ref 39.0–52.0)
Hemoglobin: 12.4 g/dL — ABNORMAL LOW (ref 13.0–17.0)
Immature Granulocytes: 1 %
Lymphocytes Relative: 32 %
Lymphs Abs: 3.1 10*3/uL (ref 0.7–4.0)
MCH: 29.9 pg (ref 26.0–34.0)
MCHC: 31.5 g/dL (ref 30.0–36.0)
MCV: 94.9 fL (ref 80.0–100.0)
Monocytes Absolute: 1 10*3/uL (ref 0.1–1.0)
Monocytes Relative: 11 %
Neutro Abs: 5.1 10*3/uL (ref 1.7–7.7)
Neutrophils Relative %: 52 %
Platelets: 214 10*3/uL (ref 150–400)
RBC: 4.15 MIL/uL — ABNORMAL LOW (ref 4.22–5.81)
RDW: 14.2 % (ref 11.5–15.5)
WBC: 9.5 10*3/uL (ref 4.0–10.5)
nRBC: 0 % (ref 0.0–0.2)

## 2019-06-26 LAB — BASIC METABOLIC PANEL
Anion gap: 11 (ref 5–15)
BUN: 30 mg/dL — ABNORMAL HIGH (ref 8–23)
CO2: 23 mmol/L (ref 22–32)
Calcium: 8.7 mg/dL — ABNORMAL LOW (ref 8.9–10.3)
Chloride: 107 mmol/L (ref 98–111)
Creatinine, Ser: 1.87 mg/dL — ABNORMAL HIGH (ref 0.61–1.24)
GFR calc Af Amer: 37 mL/min — ABNORMAL LOW (ref >60)
GFR calc non Af Amer: 32 mL/min — ABNORMAL LOW (ref >60)
Glucose, Bld: 106 mg/dL — ABNORMAL HIGH (ref 70–99)
Potassium: 4.5 mmol/L (ref 3.5–5.1)
Sodium: 141 mmol/L (ref 135–145)

## 2019-06-26 NOTE — ED Triage Notes (Signed)
Pt. Stated, for the last 2 days my wife says my BP is up. I have no symptoms.

## 2019-06-27 MED ORDER — CLONIDINE HCL 0.1 MG PO TABS
0.1000 mg | ORAL_TABLET | Freq: Once | ORAL | Status: AC
  Administered 2019-06-27: 0.1 mg via ORAL
  Filled 2019-06-27: qty 1

## 2019-06-27 MED ORDER — FUROSEMIDE 40 MG PO TABS
40.0000 mg | ORAL_TABLET | Freq: Every day | ORAL | 0 refills | Status: DC
Start: 2019-06-27 — End: 2019-09-03

## 2019-06-27 MED ORDER — CLONIDINE HCL 0.1 MG PO TABS
0.1000 mg | ORAL_TABLET | Freq: Two times a day (BID) | ORAL | 0 refills | Status: DC
Start: 2019-06-27 — End: 2019-09-03

## 2019-06-27 MED ORDER — HYDRALAZINE HCL 25 MG PO TABS
25.0000 mg | ORAL_TABLET | Freq: Once | ORAL | Status: AC
  Administered 2019-06-27: 25 mg via ORAL
  Filled 2019-06-27: qty 1

## 2019-06-27 NOTE — Discharge Instructions (Signed)
Stop taking the new prescription - hydralazine. It was not working.   Please take your blood pressure once a day and keep a record of it.  Take that record with you when you see your primary care provider.

## 2019-06-27 NOTE — ED Provider Notes (Addendum)
[facility] EMERGENCY DEPARTMENT Provider Note   CSN: [id number] Arrival date & time: 06/26/19  1745   History Chief Complaint  Patient presents with  . Hypertension    Brian Wolf is a 86 y.o. male.  The history is provided by the patient.  Hypertension  He has history of hypertension, stroke and comes in because of elevated blood pressure. He had seen his primary care provider yesterday and blood pressure was noted to be elevated and he was given a prescription for hydralazine 10 mg. He took a dose of that this afternoon, blood pressure continued to stay elevated to greater than 993 systolic over greater than 716 diastolic. Also, home health nurse had noted some leg swelling which she was not aware of. Previously, blood pressure has been well controlled and last checked at the beginning of this month. He denies headache, epistaxis, tinnitus.  Past Medical History:  Diagnosis Date  . CVA (cerebral infarction) 12/2013  . Hypertension   . Sleep difficulties   . Stroke [facility])     Patient Active Problem List   Diagnosis Date Noted  . Fall   . History of stroke 03/09/2016  . Sleep apnea 03/09/2016  . SAH (subarachnoid hemorrhage) ([place]) 03/09/2016  . Subarachnoid bleed ([place]) 03/08/2016  . Essential hypertension   . Sleep difficulties   . Homonymous hemianopsia 01/14/2014  . Homonymous hemianopsia due to recent cerebral infarction 12/24/2013  . Left-sided neglect 12/24/2013  . Gout flare 12/20/2013  . Benign essential HTN 12/20/2013  . Acute hemorrhagic infarction of brain ([place]) 12/20/2013  . Hemorrhagic stroke ([place])   . Other secondary acute gout of left foot   . Hypokalemia 12/19/2013  . Altered mental status   . Encounter for central line placement   . Encephalopathy acute   . ICH (intracerebral hemorrhage) ([place]) 12/14/2013  . Fever     Past Surgical History:  Procedure Laterality Date  . ABDOMINAL SURGERY    . CATARACT EXTRACTION Left         Family History  Problem Relation Age of Onset  . Hyperlipidemia Mother   . Hypertension Mother   . Hypertension Father     Social History   Tobacco Use  . Smoking status: Never Smoker  . Smokeless tobacco: Never Used  Substance Use Topics  . Alcohol use: No    Alcohol/week: 0.0 standard drinks  . Drug use: No    Home Medications Prior to Admission medications   Medication Sig Start Date End Date Taking? Authorizing Provider  aspirin 81 MG tablet Take 81 mg by mouth daily.    [provider]  Cyanocobalamin (VITAMIN B-12 PO) Take by mouth.    [provider]  fexofenadine (ALLEGRA) 30 MG tablet Take 30 mg by mouth 2 (two) times daily.    [provider]  hydrochlorothiazide (HYDRODIURIL) 25 MG tablet Take 1.5 tablets (37.5 mg total) by mouth daily. Patient not taking: Reported on 10/23/2018 12/26/13   Love, Ivan Anchors, PA-C  losartan (COZAAR) 100 MG tablet Take 100 mg by mouth daily.    [provider]  metoprolol (LOPRESSOR) 50 MG tablet Take 50 mg by mouth daily.  11/30/13   [provider]  Probiotic Product (PROBIOTIC DAILY PO) Take 1 capsule by mouth daily.     [provider]  ranitidine (ZANTAC) 150 MG tablet Take 150 mg by mouth 2 (two) times daily.    [provider]  simvastatin (ZOCOR) 40 MG tablet Take 40 mg  by mouth daily. 11/30/13   [provider]  tamsulosin (FLOMAX) 0.4 MG CAPS capsule  06/03/16   [provider]  Travoprost, BAK Free, (TRAVATAN) 0.004 % SOLN ophthalmic solution 1 drop at bedtime.    [provider]    Allergies    Patient has no known allergies.  Review of Systems   Review of Systems  All other systems reviewed and are negative.   Physical Exam Updated Vital Signs BP (!) 210/78 (BP Location: Left Arm)   Pulse 60   Temp 97.8 F (36.6 C) (Oral)   Resp 18   Ht 5\' 5"  (1.651 m)   SpO2 98%   BMI 27.52 kg/m   Physical Exam Vitals and  nursing note reviewed.   86 year old male, resting comfortably and in no acute distress. Vital signs are significant for elevated systolic blood pressure. Oxygen saturation is 98%, which is normal. Head is normocephalic and atraumatic. PERRLA, EOMI. Oropharynx is clear. Neck is nontender and supple without adenopathy or JVD. Back is nontender and there is no CVA tenderness. Lungs are clear without rales, wheezes, or rhonchi. Chest is nontender. Heart has regular rate and rhythm without murmur. Abdomen is soft, flat, nontender without masses or hepatosplenomegaly and peristalsis is normoactive. Extremities have 2+ pedal edema, full range of motion is present. Skin is warm and dry without rash. Neurologic: Mental status is normal, cranial nerves are intact, there are no motor or sensory deficits.  ED Results / Procedures / Treatments   Labs (all labs ordered are listed, but only abnormal results are displayed) Labs Reviewed  CBC WITH DIFFERENTIAL/PLATELET - Abnormal; Notable for the following components:      Result Value   RBC 4.15 (*)    Hemoglobin 12.4 (*)    All other components within normal limits  BASIC METABOLIC PANEL - Abnormal; Notable for the following components:   Glucose, Bld 106 (*)    BUN 30 (*)    Creatinine, Ser 1.87 (*)    Calcium 8.7 (*)    GFR calc non Af Amer 32 (*)    GFR calc Af Amer 37 (*)    All other components within normal limits   Procedures Procedures  CRITICAL CARE Performed by: Delora Fuel Total critical care time: 353 minutes Critical care time was exclusive of separately billable procedures and treating other patients. Critical care was necessary to treat or prevent imminent or life-threatening deterioration. Critical care was time spent personally by me on the following activities: development of treatment plan with patient and/or surrogate as well as nursing, discussions with consultants, evaluation of patient's response to treatment,  examination of patient, obtaining history from patient or surrogate, ordering and performing treatments and interventions, ordering and review of laboratory studies, ordering and review of radiographic studies, pulse oximetry and re-evaluation of patient's condition.  Medications Ordered in ED Medications  hydrALAZINE (APRESOLINE) tablet 25 mg (25 mg Oral Given 06/27/19 0041)  cloNIDine (CATAPRES) tablet 0.1 mg (0.1 mg Oral Given 06/27/19 0215)    ED Course  I have reviewed the triage vital signs and the nursing notes.  Pertinent lab results that were available during my care of the patient were reviewed by me and considered in my medical decision making (see chart for details).  MDM Rules/Calculators/A&P Poorly controlled blood pressure. Peripheral edema. I reviewed his antihypertensive regimen and he is on maximum dose of losartan. Heart rate is 60, so I would not increase his metoprolol dose. Also on hydrochlorothiazide. He clearly  needs additional diuretic for control of edema, and this may help with blood pressure control. Also, clearly hydralazine 10 mg is insufficient. Labs here show renal insufficiency which has progressed since 2018. Also, mild anemia which is stable. He will be given a dose of hydralazine 25 mg to see if it helps lower his blood pressure and he will need to be discharged with prescription for furosemide for control of edema.  1:41 AM There was very little response to hydralazine 25 mg.  We will give a dose of clonidine.  Clonidine has brought the blood pressure down to a much more reasonable level of 174/72.  He is discharged on prescriptions for furosemide and clonidine and is to follow-up with PCP in 1 week.  Final Clinical Impression(s) / ED Diagnoses Final diagnoses:  Poorly-controlled hypertension  Peripheral edema  Renal insufficiency  Normochromic normocytic anemia    Rx / DC Orders ED Discharge Orders         Ordered    furosemide (LASIX) 40 MG tablet   Daily     06/27/19 0039    cloNIDine (CATAPRES) 0.1 MG tablet  2 times daily     06/27/19 6116           Delora Fuel, MD 43/53/91 2258    Delora Fuel, MD 34/62/19 [phone]

## 2019-07-05 DIAGNOSIS — I10 Essential (primary) hypertension: Secondary | ICD-10-CM | POA: Diagnosis not present

## 2019-07-17 DIAGNOSIS — I10 Essential (primary) hypertension: Secondary | ICD-10-CM | POA: Diagnosis not present

## 2019-08-30 DIAGNOSIS — Z9889 Other specified postprocedural states: Secondary | ICD-10-CM | POA: Diagnosis not present

## 2019-08-30 DIAGNOSIS — H53462 Homonymous bilateral field defects, left side: Secondary | ICD-10-CM | POA: Diagnosis not present

## 2019-08-30 DIAGNOSIS — H401131 Primary open-angle glaucoma, bilateral, mild stage: Secondary | ICD-10-CM | POA: Diagnosis not present

## 2019-09-03 ENCOUNTER — Ambulatory Visit: Payer: Medicare Other | Admitting: Cardiology

## 2019-09-03 ENCOUNTER — Encounter: Payer: Self-pay | Admitting: Cardiology

## 2019-09-03 ENCOUNTER — Other Ambulatory Visit: Payer: Self-pay

## 2019-09-03 VITALS — BP 197/65 | HR 47 | Resp 16 | Ht 65.0 in | Wt 156.2 lb

## 2019-09-03 DIAGNOSIS — I440 Atrioventricular block, first degree: Secondary | ICD-10-CM | POA: Diagnosis not present

## 2019-09-03 DIAGNOSIS — N1832 Chronic kidney disease, stage 3b: Secondary | ICD-10-CM | POA: Diagnosis not present

## 2019-09-03 DIAGNOSIS — I10 Essential (primary) hypertension: Secondary | ICD-10-CM

## 2019-09-03 DIAGNOSIS — R001 Bradycardia, unspecified: Secondary | ICD-10-CM | POA: Diagnosis not present

## 2019-09-03 MED ORDER — HYDRALAZINE HCL 50 MG PO TABS: 50.0000 mg | ORAL_TABLET | Freq: Three times a day (TID) | ORAL | 3 refills | Status: DC

## 2019-09-03 MED ORDER — AMLODIPINE BESYLATE 5 MG PO TABS: 5.0000 mg | ORAL_TABLET | Freq: Every day | ORAL | 2 refills | Status: DC

## 2019-09-03 MED ORDER — ISOSORBIDE DINITRATE 30 MG PO TABS: 30.0000 mg | ORAL_TABLET | Freq: Three times a day (TID) | ORAL | 2 refills | Status: AC

## 2019-09-03 NOTE — Progress Notes (Signed)
Primary Physician/Referring:  [name], DO  Patient ID: Brian Wolf, male    DOB: 1931/04/21, 86 y.o.   MRN: [id number]  Chief Complaint  Patient presents with  . Hypertension  . New Patient (Initial Visit)    Referred by [name]   HPI:    Brian Wolf  is a 86 y.o. Caucasian male with history of stroke in 2015 with no recurrence and has mild residual left facial and also left upper extremity weakness, difficult to control hypertension, hyperlipidemia referred to me for evaluation and management of hypertension.  Patient does activities of daily living without any difficulty, denies chest pain or shortness of breath, no dizziness or syncope.  Accompanied by his wife.  Past Medical History:  Diagnosis Date  . CVA (cerebral infarction) 12/2013  . Hypertension   . Sleep difficulties   . Stroke [facility])    Past Surgical History:  Procedure Laterality Date  . ABDOMINAL SURGERY    . CATARACT EXTRACTION Left    Family History  Problem Relation Age of Onset  . Hyperlipidemia Mother   . Hypertension Mother   . Hypertension Father     Social History   Tobacco Use  . Smoking status: Never Smoker  . Smokeless tobacco: Never Used  Substance Use Topics  . Alcohol use: No    Alcohol/week: 0.0 standard drinks   Marital Status: Married  ROS  Review of Systems  Cardiovascular: Negative for chest pain, dyspnea on exertion and leg swelling.  Gastrointestinal: Negative for melena.   Objective  Blood pressure (!) 197/65, pulse 47, resp. rate 16, height '5\' 5"'  (1.651 m), weight 156 lb 3.2 oz (70.9 kg), SpO2 98 %.  Vitals with BMI 09/03/2019 06/27/2019 06/27/2019  Height '5\' 5"'  - -  Weight 156 lbs 3 oz - -  BMI 03.55 - -  Systolic 974 163 845  Diastolic 65 72 66  Pulse 47 78 -     Physical Exam Cardiovascular:     Rate and Rhythm: Normal rate and regular rhythm.     Pulses: Intact distal pulses.     Heart sounds: Normal heart sounds. No murmur heard.  No gallop.       Comments: No leg edema, no JVD. Pulmonary:     Effort: Pulmonary effort is normal.     Breath sounds: Normal breath sounds.  Abdominal:     General: Bowel sounds are normal.     Palpations: Abdomen is soft.    Laboratory examination:   Recent Labs    06/26/19 1834  NA 141  K 4.5  CL 107  CO2 23  GLUCOSE 106*  BUN 30*  CREATININE 1.87*  CALCIUM 8.7*  GFRNONAA 32*  GFRAA 37*   CrCl cannot be calculated (Patient's most recent lab result is older than the maximum 21 days allowed.).  CMP Latest Ref Rng & Units 06/26/2019 03/10/2016 03/09/2016  Glucose 70 - 99 mg/dL 106(H) 104(H) 132(H)  BUN 8 - 23 mg/dL 30(H) 37(H) 33(H)  Creatinine 0.61 - 1.24 mg/dL 1.87(H) 1.32(H) 1.42(H)  Sodium 135 - 145 mmol/L 141 137 139  Potassium 3.5 - 5.1 mmol/L 4.5 3.7 3.8  Chloride 98 - 111 mmol/L 107 101 102  CO2 22 - 32 mmol/L '23 24 26  ' Calcium 8.9 - 10.3 mg/dL 8.7(L) 8.6(L) 9.0  Total Protein 6.5 - 8.1 g/dL - - 7.0  Total Bilirubin 0.3 - 1.2 mg/dL - - 0.7  Alkaline Phos 38 - 126 U/L - - 38  AST 15 - 41 U/L - - 26  ALT 17 - 63 U/L - - 19   CBC Latest Ref Rng & Units 06/26/2019 03/10/2016 03/09/2016  WBC 4.0 - 10.5 K/uL 9.5 10.7(H) 11.5(H)  Hemoglobin 13.0 - 17.0 g/dL 12.4(L) 12.1(L) 12.1(L)  Hematocrit 39 - 52 % 39.4 37.8(L) 37.1(L)  Platelets 150 - 400 K/uL 214 229 232    Lipid Panel No results for input(s): CHOL, TRIG, LDLCALC, VLDL, HDL, CHOLHDL, LDLDIRECT in the last 8760 hours.  HEMOGLOBIN A1C Lab Results  Component Value Date   HGBA1C 6.4 (H) 12/15/2013   MPG 137 (H) 12/15/2013   TSH No results for input(s): TSH in the last 8760 hours.  External labs:   Labs 07/17/2019: Hb 12.1/HCT 37.9, platelets 219.  Normal indicis.  Serum glucose 98 mg, BUN 30, creatinine 1.36, EGFR 45 mill, potassium 5.4.  Sodium 144.  CMP otherwise normal.  TSH normal at 4.220.  Medications and allergies  No Known Allergies   Current Outpatient Medications  Medication Instructions  . amLODipine  (NORVASC) 5 mg, Oral, Daily  . aspirin 81 mg, Oral, Daily  . carvedilol (COREG) 12.5 mg, Oral, 2 times daily  . fexofenadine (ALLEGRA) 30 mg, Oral, 2 times daily  . hydrALAZINE (APRESOLINE) 50 mg, Oral, 3 times daily  . isosorbide dinitrate (ISORDIL) 30 mg, Oral, 3 times daily  . Probiotic Product (PROBIOTIC DAILY PO) 1 capsule, Oral, Daily  . simvastatin (ZOCOR) 40 mg, Oral, Daily  . spironolactone (ALDACTONE) 25 mg, Oral, Daily  . tamsulosin (FLOMAX) 0.4 MG CAPS capsule No dose, route, or frequency recorded.  . Travoprost, BAK Free, (TRAVATAN) 0.004 % SOLN ophthalmic solution 1 drop, Daily at bedtime    Radiology:   No results found.  Cardiac Studies:   Echocardiogram 03/09/2016:  Left ventricle: The cavity size was normal. Wall thickness was  increased in a pattern of mild LVH. Systolic function was  vigorous. The estimated ejection fraction was in the range of 65%  to 70%. Wall motion was normal; there were no regional wall  motion abnormalities. Doppler parameters are consistent with abnormal left ventricular relaxation (grade 1 diastolic dysfunction).  - Aortic valve: Trileaflet; mildly thickened, mildly calcified leaflets.  - Left atrium: The atrium was mildly dilated  EKG  EKG 09/03/2019: Marked sinus bradycardia at the rate of 49 bpm with first-degree AV block, normal axis, nonspecific T abnormality.  This is   Assessment     ICD-10-CM   1. Essential hypertension  I10 EKG 12-Lead    hydrALAZINE (APRESOLINE) 50 MG tablet    isosorbide dinitrate (ISORDIL) 30 MG tablet    amLODipine (NORVASC) 5 MG tablet  2. Bradycardia on ECG  R00.1   3. 1st degree AV block  I44.0   4. Stage 3b chronic kidney disease  N18.32      Medications Discontinued During This Encounter  Medication Reason  . cloNIDine (CATAPRES) 0.1 MG tablet Error  . ranitidine (ZANTAC) 150 MG tablet Error  . metoprolol (LOPRESSOR) 50 MG tablet Error  . furosemide (LASIX) 40 MG tablet Error    . Cyanocobalamin (VITAMIN B-12 PO) Error  . hydrochlorothiazide (HYDRODIURIL) 25 MG tablet Error  . diltiazem (CARDIZEM) 30 MG tablet Discontinued by provider  . hydrALAZINE (APRESOLINE) 50 MG tablet Reorder  . losartan (COZAAR) 100 MG tablet Discontinued by provider     Recommendations:   Brian Wolf  is a  86 y.o. Caucasian male with history of stroke in 2015 with no recurrence and has  mild residual left facial and also left upper extremity weakness, difficult to control hypertension, hyperlipidemia referred to me for evaluation and management of hypertension.  Patient's labs reviewed, regular patient has underlying stages 3B chronic kidney disease, he has prior stroke, marked bradycardia on the EKG with first-degree AV block.  Given his advanced age, will discontinue diltiazem, reduce the dose of carvedilol from 25 mg p.o. twice daily to 12.5 mg p.o. twice daily.  We will discontinue losartan as patient is also on spironolactone and in view of advanced kidney disease.  I will start the patient on amlodipine 5 mg daily, continue hydralazine 50 mg p.o. 3 times daily but add isosorbide dinitrate 30 mg p.o. 3 times daily.  I would like to see him back in 3 to 4 weeks for follow-up of hypertension.  Suspect mild bradycardia may lead to eventual severe systolic hypertension in view of increased stroke volume.  Hopefully by increasing his heart rate, he may be able to better achieve this blood pressure control.    Adrian Prows, MD, [facility] 09/03/2019, 10:15 AM Office: [phone]

## 2019-09-07 ENCOUNTER — Telehealth: Payer: Self-pay

## 2019-09-07 DIAGNOSIS — I10 Essential (primary) hypertension: Secondary | ICD-10-CM

## 2019-09-07 MED ORDER — AMLODIPINE BESYLATE 10 MG PO TABS: 10.0000 mg | ORAL_TABLET | Freq: Every day | ORAL | 2 refills | Status: DC

## 2019-09-07 NOTE — Telephone Encounter (Signed)
Patient wife called and stated that patient's BP this morning after waking up was 192/68 50HR and she is concerned because patient is extremely fatigued and doesn't have energy to do anything.   09/04/2019 :  9:50am 192/70  54HR 1:05pm 160/58  52HR 9:45pm 188/73  52HR  09/05/2019 :  8:20am 185/68  47HR 8:05pm 173/63  58HR  09/06/2019 :  9:10am - 192/67  57HR 11:30am - 131/53  50HR 8:20pm - 185/63  58HR  Patient wife would like for you to give her a call to see what she needs to do to help patient get his BP down and about his fatigue.    [place]

## 2019-09-07 NOTE — Telephone Encounter (Signed)
I advised the patient to increase amlodipine from 5 mg to 10 mg in the morning.  Also advised that he can take 1 extra dose of hydralazine if SBP >150 mmHg.    ICD-10-CM   1. Essential hypertension  I10 amLODipine (NORVASC) 10 MG tablet     Adrian Prows, MD, [facility] 09/07/2019, 5:29 PM Office: [phone]

## 2019-09-12 ENCOUNTER — Other Ambulatory Visit: Payer: Self-pay

## 2019-09-12 DIAGNOSIS — I10 Essential (primary) hypertension: Secondary | ICD-10-CM

## 2019-09-12 MED ORDER — HYDRALAZINE HCL 50 MG PO TABS: 50.0000 mg | ORAL_TABLET | Freq: Three times a day (TID) | ORAL | 0 refills | Status: DC

## 2019-09-13 ENCOUNTER — Ambulatory Visit: Payer: Self-pay | Admitting: Cardiology

## 2019-10-03 ENCOUNTER — Telehealth: Payer: Self-pay

## 2019-10-03 NOTE — Telephone Encounter (Signed)
Pt wife called and said that her husbands BP is very low and his heart rate is also.  160/58 HR 44 180/58 HR 43 136/53 HR  32 This morning bp was 113/48 Hr did not measure on her machine. His wife has cut some of his meds down I corrected his med list to what she has been giving him

## 2019-10-03 NOTE — Telephone Encounter (Signed)
My chart message them and state they can continue to monitor and let us know. You can mark the message to alert you in one day if they do not see it. I have sent them a message JG

## 2019-10-04 ENCOUNTER — Other Ambulatory Visit: Payer: Self-pay

## 2019-10-04 ENCOUNTER — Ambulatory Visit: Payer: Medicare Other | Admitting: Student

## 2019-10-04 ENCOUNTER — Encounter: Payer: Self-pay | Admitting: Student

## 2019-10-04 VITALS — BP 149/68 | HR 36 | Resp 16 | Ht 65.0 in | Wt 156.0 lb

## 2019-10-04 DIAGNOSIS — I495 Sick sinus syndrome: Secondary | ICD-10-CM

## 2019-10-04 DIAGNOSIS — N1832 Chronic kidney disease, stage 3b: Secondary | ICD-10-CM | POA: Diagnosis not present

## 2019-10-04 DIAGNOSIS — R001 Bradycardia, unspecified: Secondary | ICD-10-CM

## 2019-10-04 DIAGNOSIS — I129 Hypertensive chronic kidney disease with stage 1 through stage 4 chronic kidney disease, or unspecified chronic kidney disease: Secondary | ICD-10-CM | POA: Diagnosis not present

## 2019-10-04 DIAGNOSIS — I10 Essential (primary) hypertension: Secondary | ICD-10-CM

## 2019-10-04 NOTE — Progress Notes (Signed)
Primary Physician/Referring:  [name], DO  Patient ID: Brian Wolf, male    DOB: 1932/01/01, 86 y.o.   MRN: [id number]  Chief Complaint  Patient presents with  . Hypertension  . Follow-up   HPI:    Brian Wolf  is a 86 y.o. Caucasian male with history of stroke in 2015 with no recurrence and has mild residual left facial and also left upper extremity weakness, difficult to control hypertension, hyperlipidemia referred to me for evaluation and management of hypertension.  Patient presents today for 4-week follow-up of bradycardia and hypertension.  At last visit stopped diltiazem and losartan, reduced carvedilol to 12.55m twice daily, started amlodipine 10 mg, started isosorbide dinitrate, and continued hydralazine 3 times daily.  Today patient reports increasing fatigue and shortness of breath over the last 3 to 4 days. He has also been experiencing dyspnea on exertion that is limiting his activity. Patient's wife states she believes he is also feeling dizzy.  Denies swelling, syncope.   Past Medical History:  Diagnosis Date  . CVA (cerebral infarction) 12/2013  . Hypertension   . Sleep difficulties   . Stroke ([facility]    Past Surgical History:  Procedure Laterality Date  . ABDOMINAL SURGERY    . CATARACT EXTRACTION Left    Family History  Problem Relation Age of Onset  . Hyperlipidemia Mother   . Hypertension Mother   . Hypertension Father     Social History   Tobacco Use  . Smoking status: Never Smoker  . Smokeless tobacco: Never Used  Substance Use Topics  . Alcohol use: No    Alcohol/week: 0.0 standard drinks   Marital Status: Married   ROS  Review of Systems  Constitutional: Positive for malaise/fatigue.  Cardiovascular: Positive for dyspnea on exertion. Negative for chest pain, leg swelling, orthopnea, palpitations, paroxysmal nocturnal dyspnea and syncope.  Respiratory: Positive for shortness of breath.   Gastrointestinal: Negative for  melena.  Neurological: Positive for dizziness.   Objective  Blood pressure (!) 149/68, pulse (!) 36, resp. rate 16, height '5\' 5"'  (1.651 m), weight 156 lb (70.8 kg), SpO2 98 %.  Vitals with BMI 10/04/2019 09/03/2019 06/27/2019  Height '5\' 5"'  '5\' 5"'  -  Weight 156 lbs 156 lbs 3 oz -  BMI 219.50293.26-  Systolic [id number] Diastolic 68 65 72  Pulse 36 47 78    Physical Exam Cardiovascular:     Rate and Rhythm: Regular rhythm. Bradycardia present.     Pulses: Intact distal pulses.          Carotid pulses are 2+ on the right side and 2+ on the left side.      Radial pulses are 2+ on the right side and 2+ on the left side.     Heart sounds: Normal heart sounds. No murmur heard.  No gallop.      Comments: Bilateral legs 1+ pitting edema. No JVD. Patient appears fatigued.  Pulmonary:     Effort: Pulmonary effort is normal.     Breath sounds: Normal breath sounds.  Abdominal:     General: Bowel sounds are normal.     Palpations: Abdomen is soft.    Laboratory examination:   Recent Labs    06/26/19 1834  NA 141  K 4.5  CL 107  CO2 23  GLUCOSE 106*  BUN 30*  CREATININE 1.87*  CALCIUM 8.7*  GFRNONAA 32*  GFRAA 37*   CrCl cannot be calculated (Patient's most recent lab result  is older than the maximum 21 days allowed.).  CMP Latest Ref Rng & Units 06/26/2019 03/10/2016 03/09/2016  Glucose 70 - 99 mg/dL 106(H) 104(H) 132(H)  BUN 8 - 23 mg/dL 30(H) 37(H) 33(H)  Creatinine 0.61 - 1.24 mg/dL 1.87(H) 1.32(H) 1.42(H)  Sodium 135 - 145 mmol/L 141 137 139  Potassium 3.5 - 5.1 mmol/L 4.5 3.7 3.8  Chloride 98 - 111 mmol/L 107 101 102  CO2 22 - 32 mmol/L '23 24 26  ' Calcium 8.9 - 10.3 mg/dL 8.7(L) 8.6(L) 9.0  Total Protein 6.5 - 8.1 g/dL - - 7.0  Total Bilirubin 0.3 - 1.2 mg/dL - - 0.7  Alkaline Phos 38 - 126 U/L - - 38  AST 15 - 41 U/L - - 26  ALT 17 - 63 U/L - - 19   CBC Latest Ref Rng & Units 06/26/2019 03/10/2016 03/09/2016  WBC 4.0 - 10.5 K/uL 9.5 10.7(H) 11.5(H)  Hemoglobin 13.0 - 17.0  g/dL 12.4(L) 12.1(L) 12.1(L)  Hematocrit 39 - 52 % 39.4 37.8(L) 37.1(L)  Platelets 150 - 400 K/uL 214 229 232    Lipid Panel No results for input(s): CHOL, TRIG, LDLCALC, VLDL, HDL, CHOLHDL, LDLDIRECT in the last 8760 hours.  HEMOGLOBIN A1C Lab Results  Component Value Date   HGBA1C 6.4 (H) 12/15/2013   MPG 137 (H) 12/15/2013   TSH No results for input(s): TSH in the last 8760 hours.  External labs:   Labs 07/17/2019: Hb 12.1/HCT 37.9, platelets 219.  Normal indicis.  Serum glucose 98 mg, BUN 30, creatinine 1.36, EGFR 45 mill, potassium 5.4.  Sodium 144.  CMP otherwise normal.  TSH normal at 4.220.  Medications and allergies  No Known Allergies   Current Outpatient Medications  Medication Instructions  . aspirin 81 mg, Oral, Daily  . fexofenadine (ALLEGRA) 30 mg, Oral, 2 times daily  . hydrALAZINE (APRESOLINE) 50 mg, Oral, 3 times daily  . isosorbide dinitrate (ISORDIL) 30 mg, Oral, 3 times daily  . Probiotic Product (PROBIOTIC DAILY PO) 1 capsule, Oral, Daily  . simvastatin (ZOCOR) 40 mg, Oral, Daily  . spironolactone (ALDACTONE) 25 mg, Oral, Daily  . tamsulosin (FLOMAX) 0.4 MG CAPS capsule No dose, route, or frequency recorded.  . Travoprost, BAK Free, (TRAVATAN) 0.004 % SOLN ophthalmic solution 1 drop, Daily at bedtime    Radiology:   No results found.  Cardiac Studies:   Echocardiogram 03/09/2016:  Left ventricle: The cavity size was normal. Wall thickness was  increased in a pattern of mild LVH. Systolic function was  vigorous. The estimated ejection fraction was in the range of 65%  to 70%. Wall motion was normal; there were no regional wall  motion abnormalities. Doppler parameters are consistent with abnormal left ventricular relaxation (grade 1 diastolic dysfunction).  - Aortic valve: Trileaflet; mildly thickened, mildly calcified leaflets.  - Left atrium: The atrium was mildly dilated  EKG EKG 10/04/2019: Sinus node dysfunction with  severe bradycardia at 36 bpm. Cannot exclude underlying atrial fibrillation. Will likely need pacemaker.   EKG 09/03/2019: Marked sinus bradycardia at the rate of 49 bpm with first-degree AV block, normal axis, nonspecific T abnormality.  This is   Assessment     ICD-10-CM   1. Sinus node dysfunction (HCC)  I49.5   2. Bradycardia on ECG  R00.1 Ambulatory referral to Cardiac Electrophysiology  3. Stage 3b chronic kidney disease  N18.32   4. Essential hypertension  I10 EKG 12-Lead     Medications Discontinued During This Encounter  Medication Reason  .  carvedilol (COREG) 25 MG tablet Discontinued by provider  . amLODipine (NORVASC) 10 MG tablet Discontinued by provider     Recommendations:   Brian Wolf  is a  86 y.o. Caucasian male with history of stroke in 2015 with no recurrence and has mild residual left facial and also left upper extremity weakness, difficult to control hypertension, hyperlipidemia referred to me for evaluation and management of hypertension.  Patient presents today with sinus node dysfunction with severe bradycardia. There are questionable P waves noted in EKG, however cannot exclude underlying atrial fibrillation.  Patient will likely need a pacemaker, will send urgent referral to electrophysiologist.  Despite possible underlying atrial fibrillation will refrain from starting anticoagulation today in light of urgent referral to electrophysiologist, will await their opinion.  Have spoken directly with EP physician, patient is scheduled to be seen on Tuesday.    He is currently only on one negative chronotropic agent. In view of severe bradycardia will stop both carvedilol and amlodipine today.  Advised patient to go to the emergency department if symptoms acutely worsen.   In regard to hypertension, blood pressure is elevated today. However, will focus first on addressing sinus node dysfunction, as improving heart rate may also improve blood pressure control.     Patient was seen in collaboration with Dr. Einar Gip. He also reviewed patient's chart and examined the patient. Dr. Einar Gip is in agreement of the plan.     Alethia Berthold, MD, [facility] 10/04/2019, 12:39 PM Office: [phone]

## 2019-10-05 ENCOUNTER — Ambulatory Visit: Payer: Medicare Other | Admitting: Cardiology

## 2019-10-09 ENCOUNTER — Other Ambulatory Visit (INDEPENDENT_AMBULATORY_CARE_PROVIDER_SITE_OTHER): Payer: Medicare Other

## 2019-10-09 ENCOUNTER — Other Ambulatory Visit: Payer: Self-pay

## 2019-10-09 ENCOUNTER — Encounter: Payer: Self-pay | Admitting: Cardiology

## 2019-10-09 ENCOUNTER — Ambulatory Visit: Payer: Medicare Other | Admitting: Cardiology

## 2019-10-09 VITALS — BP 146/62 | HR 56 | Ht 65.0 in | Wt 148.2 lb

## 2019-10-09 DIAGNOSIS — R001 Bradycardia, unspecified: Secondary | ICD-10-CM | POA: Diagnosis not present

## 2019-10-09 NOTE — Patient Instructions (Signed)
Medication Instructions:  Your physician recommends that you continue on your current medications as directed. Please refer to the Current Medication list given to you today.  *If you need a refill on your cardiac medications before your next appointment, please call your pharmacy*   Lab Work: None ordered   Testing/Procedures: Your physician has recommended that you wear a 2 week holter monitor. Holter monitors are medical devices that record the hearts electrical activity. Doctors most often use these monitors to diagnose arrhythmias. Arrhythmias are problems with the speed or rhythm of the heartbeat. The monitor is a small, portable device. You can wear one while you do your normal daily activities. This is usually used to diagnose what is causing palpitations/syncope (passing out).   Follow-Up: At [facility], you and your health needs are our priority.  As part of our continuing mission to provide you with exceptional heart care, we have created designated Provider Care Teams.  These Care Teams include your primary Cardiologist (physician) and Advanced Practice Providers (APPs -  Physician Assistants and Nurse Practitioners) who all work together to provide you with the care you need, when you need it.  We recommend signing up for the patient portal called "MyChart".  Sign up information is provided on this After Visit Summary.  MyChart is used to connect with patients for Virtual Visits (Telemedicine).  Patients are able to view lab/test results, encounter notes, upcoming appointments, etc.  Non-urgent messages can be sent to your provider as well.   To learn more about what you can do with MyChart, go to [email or web address].    Your next appointment:    to be determined  The format for your next appointment:   In Person  Provider:   Allegra Lai, MD   Thank you for choosing [facility]!!   Trinidad Curet, RN [phone]    Other Instructions  ZIO XT- Long  Term Monitor Instructions   Your physician has requested you wear your ZIO patch monitor_______days.   This is a single patch monitor.  [employer] supplies one patch monitor per enrollment.  Additional stickers are not available.   Please do not apply patch if you will be having a Nuclear Stress Test, Echocardiogram, Cardiac CT, MRI, or Chest Xray during the time frame you would be wearing the monitor. The patch cannot be worn during these tests.  You cannot remove and re-apply the ZIO XT patch monitor.   Your ZIO patch monitor will be sent USPS Priority mail from [facility] directly to your home address. The monitor may also be mailed to a PO BOX if home delivery is not available.   It may take 3-5 days to receive your monitor after you have been enrolled.   Once you have received you monitor, please review enclosed instructions.  Your monitor has already been registered assigning a specific monitor serial # to you.   Applying the monitor   Shave hair from upper left chest.   Hold abrader disc by orange tab.  Rub abrader in 40 strokes over left upper chest as indicated in your monitor instructions.   Clean area with 4 enclosed alcohol pads .  Use all pads to assure are is cleaned thoroughly.  Let dry.   Apply patch as indicated in monitor instructions.  Patch will be place under collarbone on left side of chest with arrow pointing upward.   Rub patch adhesive wings for 2 minutes.Remove white label marked "1".  Remove white label marked "2".  Rub  patch adhesive wings for 2 additional minutes.   While looking in a mirror, press and release button in center of patch.  A small green light will flash 3-4 times .  This will be your only indicator the monitor has been turned on.     Do not shower for the first 24 hours.  You may shower after the first 24 hours.   Press button if you feel a symptom. You will hear a small click.  Record Date, Time and Symptom in the Patient Log Book.     When you are ready to remove patch, follow instructions on last 2 pages of Patient Log Book.  Stick patch monitor onto last page of Patient Log Book.   Place Patient Log Book in [place] box.  Use locking tab on box and tape box closed securely.  The [employer] and [employer] has [employer] on it.  Please place in mailbox as soon as possible.  Your physician should have your test results approximately 7 days after the monitor has been mailed back to [facility].   Call [employer] at [phone] if you have questions regarding your ZIO XT patch monitor.  Call them immediately if you see an orange light blinking on your monitor.   If your monitor falls off in less than 4 days contact our Monitor department at [phone].  If your monitor becomes loose or falls off after 4 days call [employer] at [phone] for suggestions on securing your monitor.

## 2019-10-09 NOTE — Progress Notes (Signed)
Electrophysiology Office Note   Date:  10/09/2019   ID:  Brian Wolf, DOB 03-Apr-1931, MRN [id number]  PCP:  [name], DO  Cardiologist: Einar Gip Primary Electrophysiologist:  Jeralyn Nolden Meredith Leeds, MD    Chief Complaint: Bradycardia   History of Present Illness: Brian Wolf is a 86 y.o. male who is being seen today for the evaluation of bradycardia at the request of Adrian Prows. Presenting today for electrophysiology evaluation.  He has a history of hypertension, CVA.  The stroke occurred in 2015.  He has had some mild left facial and left upper extremity weakness.  He presented to cardiology clinic with 4 weeks of bradycardia and hypertension.  At prior visits, his diltiazem and carvedilol were reduced.  He reported with increasing fatigue and shortness of breath over 3 to 4 days.  On that day, he was found to have significant bradycardia.  His carvedilol and amlodipine were both stopped.  Today, he denies symptoms of palpitations, chest pain, shortness of breath, orthopnea, PND, lower extremity edema, claudication, dizziness, presyncope, syncope, bleeding, or neurologic sequela. The patient is tolerating medications without difficulties.  Per the patient's wife, he has been quite fatigued for the past few months.  She states that in May, his heart rate was slow.  He has had multiple medication changes since then.  She brings in heart rate and blood pressure recordings which have shown that heart rates since stopping his carvedilol and amlodipine have been in the 50s.  Past Medical History:  Diagnosis Date  . CVA (cerebral infarction) 12/2013  . Hypertension   . Sleep difficulties   . Stroke [facility])    Past Surgical History:  Procedure Laterality Date  . ABDOMINAL SURGERY    . CATARACT EXTRACTION Left      Current Outpatient Medications  Medication Sig Dispense Refill  . aspirin 81 MG tablet Take 81 mg by mouth daily.    . fexofenadine (ALLEGRA) 30 MG tablet Take 30 mg  by mouth 2 (two) times daily.    . hydrALAZINE (APRESOLINE) 50 MG tablet Take 1 tablet (50 mg total) by mouth 3 (three) times daily. 30 tablet 0  . isosorbide dinitrate (ISORDIL) 30 MG tablet Take 1 tablet (30 mg total) by mouth 3 (three) times daily. 90 tablet 2  . Probiotic Product (PROBIOTIC DAILY PO) Take 1 capsule by mouth daily.     . simvastatin (ZOCOR) 40 MG tablet Take 40 mg by mouth daily.    Marland Kitchen spironolactone (ALDACTONE) 25 MG tablet Take 25 mg by mouth daily.    . tamsulosin (FLOMAX) 0.4 MG CAPS capsule     . Travoprost, BAK Free, (TRAVATAN) 0.004 % SOLN ophthalmic solution 1 drop at bedtime.     No current facility-administered medications for this visit.    Allergies:   Patient has no known allergies.   Social History:  The patient  reports that he has never smoked. He has never used smokeless tobacco. He reports that he does not drink alcohol and does not use drugs.   Family History:  The patient's family history includes Hyperlipidemia in his mother; Hypertension in his father and mother.    ROS:  Please see the history of present illness.   Otherwise, review of systems is positive for none.   All other systems are reviewed and negative.    PHYSICAL EXAM: VS:  BP (!) 146/62   Pulse (!) 56   Ht 5\' 5"  (1.651 m)   Wt 148 lb 3.2 oz (  67.2 kg)   SpO2 96%   BMI 24.66 kg/m  , BMI Body mass index is 24.66 kg/m. GEN: Well nourished, well developed, in no acute distress  HEENT: normal  Neck: no JVD, carotid bruits, or masses Cardiac: RRR; no murmurs, rubs, or gallops,no edema  Respiratory:  clear to auscultation bilaterally, normal work of breathing GI: soft, nontender, nondistended, + BS MS: no deformity or atrophy  Skin: warm and dry Neuro:  Strength and sensation are intact Psych: euthymic mood, full affect  EKG:  EKG is ordered today. Personal review of the ekg ordered shows ectopic atrial rhythm, rate 56  Recent Labs: 06/26/2019: BUN 30; Creatinine, Ser 1.87;  Hemoglobin 12.4; Platelets 214; Potassium 4.5; Sodium 141    Lipid Panel     Component Value Date/Time   CHOL 147 12/15/2013 0342   TRIG 84 12/15/2013 0342   HDL 54 12/15/2013 0342   CHOLHDL 2.7 12/15/2013 0342   VLDL 17 12/15/2013 0342   LDLCALC 76 12/15/2013 0342     Wt Readings from Last 3 Encounters:  10/09/19 148 lb 3.2 oz (67.2 kg)  10/04/19 156 lb (70.8 kg)  09/03/19 156 lb 3.2 oz (70.9 kg)      Other studies Reviewed: Additional studies/ records that were reviewed today include: TTE 03/09/2016 Review of the above records today demonstrates:  - Left ventricle: The cavity size was normal. Wall thickness was  increased in a pattern of mild LVH. Systolic function was  vigorous. The estimated ejection fraction was in the range of 65%  to 70%. Wall motion was normal; there were no regional wall  motion abnormalities. Doppler parameters are consistent with  abnormal left ventricular relaxation (grade 1 diastolic  dysfunction).  - Aortic valve: Trileaflet; mildly thickened, mildly calcified  leaflets.  - Left atrium: The atrium was mildly dilated.    ASSESSMENT AND PLAN:  1.  Sinus node dysfunction: Patient has had significant bradycardia on high doses of carvedilol.  His carvedilol and amlodipine were stopped, and his ECG today shows an ectopic atrial rhythm with heart rates in the 50s.  He does continue to have his similar episodes of fatigue, though they are mildly improved.  At this point, I do not feel there is an indication for pacemaker implant, as his heart rates have been more normal and he is no longer in junctional rhythm.  Would hold off on anticoagulation as well.  I Brian Wolf put on a 2-week monitor to further determine his rhythm as an outpatient.  If he shows significant bradycardia, pacemaker would be indicated.  2.  Hypertension: Mildly elevated today.  Plan per primary cardiology.  Case discussed with primary cardiology  Current medicines are  reviewed at length with the patient today.   The patient does not have concerns regarding his medicines.  The following changes were made today:  none  Labs/ tests ordered today include:  Orders Placed This Encounter  Procedures  . LONG TERM MONITOR (3-14 DAYS)  . EKG 12-Lead     Disposition:   FU with Stashia Sia pending cardiac monitor  Signed, Baron Parmelee Meredith Leeds, MD  10/09/2019 5:02 PM     [address] [place] [address] [phone] (office) [phone] (fax)

## 2019-10-10 NOTE — Progress Notes (Signed)
Primary Physician/Referring:  [name], DO  Patient ID: Brian Wolf, male    DOB: [birth year], 86 y.o.   MRN: [id number]  No chief complaint on file.  HPI:    Brian Wolf  is a 86 y.o. caucasian male with history of stroke in 2015 with no recurrence and has mild residual left facial and also left upper extremity weakness, difficult to control hypertension, hyperlipidemia referred to me for evaluation and management of hypertension.  At last visit stopped both carvedilol and amlodipine in light of severe bradycardia.  Since last visit patient was seen by Dr. Curt Bears of electrophysiology. Dr. Curt Bears noted that patient's heart rate has improved to the 50s since stopping his carvedilol and amlodipine.  He put on a 2-week monitor to further determine patient's rhythm, notes that if significant bradycardia appears on monitor pacemaker would be indicated.  However in light of patient's heart rate in the 50s and no longer in junctional rhythm, pacemaker is not indicated at this point.  Patient presents for follow-up on sinus node dysfunction and hypertension.  Patient monitors blood pressure at home and it is still elevated averaging 160-170/60-70.  Home heart rate has ranged from 50 to 60 bpm.  Patient still experiencing fatigue and mild shortness of breath however it has improved since visit on 10/04/2019.  Denies dizziness, swelling, syncope.  Overall he is feeling much improved since last visit to our office.    Past Medical History:  Diagnosis Date  . CVA (cerebral infarction) 12/2013  . Hypertension   . Sleep difficulties   . Stroke [facility])    Past Surgical History:  Procedure Laterality Date  . ABDOMINAL SURGERY    . CATARACT EXTRACTION Left    Family History  Problem Relation Age of Onset  . Hyperlipidemia Mother   . Hypertension Mother   . Hypertension Father     Social History   Tobacco Use  . Smoking status: Never Smoker  . Smokeless tobacco: Never Used   Substance Use Topics  . Alcohol use: No    Alcohol/week: 0.0 standard drinks   Marital Status: Married  ROS  Review of Systems  Constitutional: Positive for malaise/fatigue.  Cardiovascular: Positive for dyspnea on exertion (improved). Negative for chest pain, leg swelling, orthopnea, palpitations, paroxysmal nocturnal dyspnea and syncope.  Respiratory: Positive for shortness of breath (improved).   Gastrointestinal: Negative for melena.  Neurological: Negative for dizziness.   Objective  There were no vitals taken for this visit.  Vitals with BMI 10/09/2019 10/04/2019 09/03/2019  Height _0  _1  _2   Weight 148 lbs 3 oz 156 lbs 156 lbs 3 oz  BMI 24.66 40.98 11.91  Systolic 478 295 621  Diastolic 62 68 65  Pulse 56 36 47   Physical Exam Constitutional:      General: He is not in acute distress. Cardiovascular:     Rate and Rhythm: Normal rate and regular rhythm.     Pulses: Intact distal pulses.          Carotid pulses are 2+ on the right side and 2+ on the left side.      Radial pulses are 2+ on the right side and 2+ on the left side.     Heart sounds: Normal heart sounds. No murmur heard.  No gallop.      Comments: Trace edema bilateral ankle. No JVD. Patient appears fatigued, but improved compared to last visit.   Pulmonary:     Effort: Pulmonary effort is  normal.     Breath sounds: Normal breath sounds.  Abdominal:     General: Bowel sounds are normal.     Palpations: Abdomen is soft.  Neurological:     Mental Status: He is alert.    Laboratory examination:   Recent Labs    06/26/19 1834  NA 141  K 4.5  CL 107  CO2 23  GLUCOSE 106*  BUN 30*  CREATININE 1.87*  CALCIUM 8.7*  GFRNONAA 32*  GFRAA 37*   CrCl cannot be calculated (Patient's most recent lab result is older than the maximum 21 days allowed.).  CMP Latest Ref Rng & Units 06/26/2019 03/10/2016 03/09/2016  Glucose 70 - 99 mg/dL 106(H) 104(H) 132(H)  BUN 8 - 23 mg/dL 30(H) 37(H) 33(H)  Creatinine  0.61 - 1.24 mg/dL 1.87(H) 1.32(H) 1.42(H)  Sodium 135 - 145 mmol/L 141 137 139  Potassium 3.5 - 5.1 mmol/L 4.5 3.7 3.8  Chloride 98 - 111 mmol/L 107 101 102  CO2 22 - 32 mmol/L _0 Calcium 8.9 - 10.3 mg/dL 8.7(L) 8.6(L) 9.0  Total Protein 6.5 - 8.1 g/dL - - 7.0  Total Bilirubin 0.3 - 1.2 mg/dL - - 0.7  Alkaline Phos 38 - 126 U/L - - 38  AST 15 - 41 U/L - - 26  ALT 17 - 63 U/L - - 19   CBC Latest Ref Rng & Units 06/26/2019 03/10/2016 03/09/2016  WBC 4.0 - 10.5 K/uL 9.5 10.7(H) 11.5(H)  Hemoglobin 13.0 - 17.0 g/dL 12.4(L) 12.1(L) 12.1(L)  Hematocrit 39 - 52 % 39.4 37.8(L) 37.1(L)  Platelets 150 - 400 K/uL 214 229 232    Lipid Panel No results for input(s): CHOL, TRIG, LDLCALC, VLDL, HDL, CHOLHDL, LDLDIRECT in the last 8760 hours.  HEMOGLOBIN A1C Lab Results  Component Value Date   HGBA1C 6.4 (H) 12/15/2013   MPG 137 (H) 12/15/2013   TSH No results for input(s): TSH in the last 8760 hours.  External labs:   Labs 07/17/2019: Hb 12.1/HCT 37.9, platelets 219.  Normal indicis.  Serum glucose 98 mg, BUN 30, creatinine 1.36, EGFR 45 mill, potassium 5.4.  Sodium 144.  CMP otherwise normal.  TSH normal at 4.220.  Medications and allergies  No Known Allergies   Current Outpatient Medications  Medication Instructions  . aspirin 81 mg, Oral, Daily  . fexofenadine (ALLEGRA) 30 mg, Oral, 2 times daily  . hydrALAZINE (APRESOLINE) 50 mg, Oral, 3 times daily  . isosorbide dinitrate (ISORDIL) 30 mg, Oral, 3 times daily  . Probiotic Product (PROBIOTIC DAILY PO) 1 capsule, Oral, Daily  . simvastatin (ZOCOR) 40 mg, Oral, Daily  . spironolactone (ALDACTONE) 25 mg, Oral, Daily  . tamsulosin (FLOMAX) 0.4 MG CAPS capsule No dose, route, or frequency recorded.  . Travoprost, BAK Free, (TRAVATAN) 0.004 % SOLN ophthalmic solution 1 drop, Daily at bedtime    Radiology:   No results found.  Cardiac Studies:   Echocardiogram 03/09/2016:  Left ventricle: The cavity size was normal. Wall  thickness was  increased in a pattern of mild LVH. Systolic function was  vigorous. The estimated ejection fraction was in the range of 65%  to 70%. Wall motion was normal; there were no regional wall  motion abnormalities. Doppler parameters are consistent with abnormal left ventricular relaxation (grade 1 diastolic dysfunction).  - Aortic valve: Trileaflet; mildly thickened, mildly calcified leaflets.  - Left atrium: The atrium was mildly dilated  EKG EKG 10/11/2019: Ectopic atrial rhythm at 59 bpm. Left axis deviation. Poor  R wave progression, cannot exclude anteroseptal infarct old.   EKG 10/04/2019: Sinus node dysfunction with severe bradycardia at 36 bpm. Cannot exclude underlying atrial fibrillation. Will likely need pacemaker.   EKG 09/03/2019: Marked sinus bradycardia at the rate of 49 bpm with first-degree AV block, normal axis, nonspecific T abnormality.  This is   Assessment     ICD-10-CM   1. Sinus node dysfunction (HCC)  I49.5   2. Essential hypertension  I10   3. Stage 3b chronic kidney disease  N18.32      There are no discontinued medications.   Recommendations:   Brian Wolf  is a  86 y.o. Caucasian male with history of stroke in 2015 with no recurrence and has mild residual left facial and also left upper extremity weakness, difficult to control hypertension, hyperlipidemia referred to me for evaluation and management of hypertension.  Patient presents today for follow-up of sinus node dysfunction with severe bradycardia and hypertension.  Today EKG revealed patient with ectopic atrial rhythm at 58 bpm, he is no longer in junctional rhythm.  Patient is presently wearing a 2 week heart monitor as ordered by Dr. Curt Bears.  Results of this monitor will determine indication for pacemaker.    At this time patient's blood pressure is still elevated above goal, however in light of ongoing bradycardia will not change antihypertensive medications at this  time.  Patient remains symptomatic but stable, however discussed if symptoms worsen or patient becomes unstable to go to the nearest emergency department for evaluation.   We will follow up in 2 weeks for sinus node dysfunction, hypertension, and to discuss electrophysiologist recommendations.  Patient was seen in collaboration with Dr. Einar Gip. He also reviewed patient's chart and is in agreement of the plan.    Brian Berthold, PA-C 10/11/2019, 2:33 PM Office: [phone]

## 2019-10-11 ENCOUNTER — Encounter: Payer: Self-pay | Admitting: Student

## 2019-10-11 ENCOUNTER — Ambulatory Visit: Payer: Medicare Other | Admitting: Student

## 2019-10-11 ENCOUNTER — Other Ambulatory Visit: Payer: Self-pay

## 2019-10-11 VITALS — BP 158/66 | HR 62 | Resp 16 | Ht 65.0 in | Wt 147.0 lb

## 2019-10-11 DIAGNOSIS — N1832 Chronic kidney disease, stage 3b: Secondary | ICD-10-CM

## 2019-10-11 DIAGNOSIS — I10 Essential (primary) hypertension: Secondary | ICD-10-CM

## 2019-10-11 DIAGNOSIS — I495 Sick sinus syndrome: Secondary | ICD-10-CM

## 2019-10-16 ENCOUNTER — Telehealth: Payer: Self-pay

## 2019-10-16 NOTE — Telephone Encounter (Signed)
Any update on his monitor. Looks like HR hovering around 50s. JG

## 2019-10-16 NOTE — Telephone Encounter (Signed)
Pt wife called to inform us about pt bp, pt wife mention he looks tired and SOB.   09/12: 170/65 pulse 58 10:45am 09/12 171/65 pulse 59 9:20pm 09/13 154/64 pulse 51 11:30am 09/13 121/67 pulse 61 9:30pm 09/14 157/60 pulse 51 9:20am 09/14 140/60 pulse 61 10:45 Please advise.

## 2019-10-16 NOTE — Telephone Encounter (Signed)
Still pending, thanks

## 2019-10-17 ENCOUNTER — Inpatient Hospital Stay (HOSPITAL_COMMUNITY)
Admission: EM | Admit: 2019-10-17 | Discharge: 2019-10-22 | DRG: 377 | Disposition: A | Discharge status: Skilled Nursing Facility | Payer: Medicare Other | Attending: Family Medicine | Admitting: Family Medicine

## 2019-10-17 ENCOUNTER — Encounter (HOSPITAL_COMMUNITY): Payer: Self-pay | Admitting: Internal Medicine

## 2019-10-17 ENCOUNTER — Other Ambulatory Visit: Payer: Self-pay

## 2019-10-17 ENCOUNTER — Telehealth: Payer: Self-pay | Admitting: Cardiology

## 2019-10-17 DIAGNOSIS — F0390 Unspecified dementia without behavioral disturbance: Secondary | ICD-10-CM | POA: Diagnosis present

## 2019-10-17 DIAGNOSIS — K219 Gastro-esophageal reflux disease without esophagitis: Secondary | ICD-10-CM | POA: Diagnosis not present

## 2019-10-17 DIAGNOSIS — N184 Chronic kidney disease, stage 4 (severe): Secondary | ICD-10-CM | POA: Diagnosis present

## 2019-10-17 DIAGNOSIS — K921 Melena: Secondary | ICD-10-CM | POA: Diagnosis not present

## 2019-10-17 DIAGNOSIS — K922 Gastrointestinal hemorrhage, unspecified: Secondary | ICD-10-CM | POA: Diagnosis present

## 2019-10-17 DIAGNOSIS — Z79899 Other long term (current) drug therapy: Secondary | ICD-10-CM | POA: Diagnosis not present

## 2019-10-17 DIAGNOSIS — E875 Hyperkalemia: Secondary | ICD-10-CM | POA: Diagnosis present

## 2019-10-17 DIAGNOSIS — R58 Hemorrhage, not elsewhere classified: Secondary | ICD-10-CM | POA: Diagnosis not present

## 2019-10-17 DIAGNOSIS — R404 Transient alteration of awareness: Secondary | ICD-10-CM | POA: Diagnosis not present

## 2019-10-17 DIAGNOSIS — D649 Anemia, unspecified: Secondary | ICD-10-CM | POA: Diagnosis not present

## 2019-10-17 DIAGNOSIS — K2981 Duodenitis with bleeding: Secondary | ICD-10-CM | POA: Diagnosis present

## 2019-10-17 DIAGNOSIS — I69354 Hemiplegia and hemiparesis following cerebral infarction affecting left non-dominant side: Secondary | ICD-10-CM

## 2019-10-17 DIAGNOSIS — R278 Other lack of coordination: Secondary | ICD-10-CM | POA: Diagnosis not present

## 2019-10-17 DIAGNOSIS — M6281 Muscle weakness (generalized): Secondary | ICD-10-CM | POA: Diagnosis not present

## 2019-10-17 DIAGNOSIS — N189 Chronic kidney disease, unspecified: Secondary | ICD-10-CM | POA: Diagnosis present

## 2019-10-17 DIAGNOSIS — Z20822 Contact with and (suspected) exposure to covid-19: Secondary | ICD-10-CM | POA: Diagnosis present

## 2019-10-17 DIAGNOSIS — I495 Sick sinus syndrome: Secondary | ICD-10-CM | POA: Diagnosis not present

## 2019-10-17 DIAGNOSIS — K920 Hematemesis: Secondary | ICD-10-CM | POA: Diagnosis not present

## 2019-10-17 DIAGNOSIS — E876 Hypokalemia: Secondary | ICD-10-CM | POA: Diagnosis not present

## 2019-10-17 DIAGNOSIS — R2681 Unsteadiness on feet: Secondary | ICD-10-CM | POA: Diagnosis not present

## 2019-10-17 DIAGNOSIS — N400 Enlarged prostate without lower urinary tract symptoms: Secondary | ICD-10-CM | POA: Diagnosis present

## 2019-10-17 DIAGNOSIS — K264 Chronic or unspecified duodenal ulcer with hemorrhage: Secondary | ICD-10-CM | POA: Diagnosis not present

## 2019-10-17 DIAGNOSIS — Z7982 Long term (current) use of aspirin: Secondary | ICD-10-CM

## 2019-10-17 DIAGNOSIS — R410 Disorientation, unspecified: Secondary | ICD-10-CM | POA: Diagnosis not present

## 2019-10-17 DIAGNOSIS — R6889 Other general symptoms and signs: Secondary | ICD-10-CM | POA: Diagnosis not present

## 2019-10-17 DIAGNOSIS — N179 Acute kidney failure, unspecified: Secondary | ICD-10-CM | POA: Diagnosis present

## 2019-10-17 DIAGNOSIS — I2510 Atherosclerotic heart disease of native coronary artery without angina pectoris: Secondary | ICD-10-CM | POA: Diagnosis not present

## 2019-10-17 DIAGNOSIS — G9341 Metabolic encephalopathy: Secondary | ICD-10-CM | POA: Diagnosis present

## 2019-10-17 DIAGNOSIS — N19 Unspecified kidney failure: Secondary | ICD-10-CM

## 2019-10-17 DIAGNOSIS — Z8673 Personal history of transient ischemic attack (TIA), and cerebral infarction without residual deficits: Secondary | ICD-10-CM

## 2019-10-17 DIAGNOSIS — R392 Extrarenal uremia: Secondary | ICD-10-CM | POA: Diagnosis not present

## 2019-10-17 DIAGNOSIS — D62 Acute posthemorrhagic anemia: Secondary | ICD-10-CM | POA: Diagnosis present

## 2019-10-17 DIAGNOSIS — M2550 Pain in unspecified joint: Secondary | ICD-10-CM | POA: Diagnosis not present

## 2019-10-17 DIAGNOSIS — I499 Cardiac arrhythmia, unspecified: Secondary | ICD-10-CM | POA: Diagnosis not present

## 2019-10-17 DIAGNOSIS — K30 Functional dyspepsia: Secondary | ICD-10-CM | POA: Diagnosis not present

## 2019-10-17 DIAGNOSIS — Z66 Do not resuscitate: Secondary | ICD-10-CM | POA: Diagnosis present

## 2019-10-17 DIAGNOSIS — I10 Essential (primary) hypertension: Secondary | ICD-10-CM | POA: Diagnosis not present

## 2019-10-17 DIAGNOSIS — T7840XD Allergy, unspecified, subsequent encounter: Secondary | ICD-10-CM | POA: Diagnosis not present

## 2019-10-17 DIAGNOSIS — Z7401 Bed confinement status: Secondary | ICD-10-CM | POA: Diagnosis not present

## 2019-10-17 DIAGNOSIS — I129 Hypertensive chronic kidney disease with stage 1 through stage 4 chronic kidney disease, or unspecified chronic kidney disease: Secondary | ICD-10-CM | POA: Diagnosis present

## 2019-10-17 DIAGNOSIS — K269 Duodenal ulcer, unspecified as acute or chronic, without hemorrhage or perforation: Secondary | ICD-10-CM | POA: Diagnosis not present

## 2019-10-17 DIAGNOSIS — Z743 Need for continuous supervision: Secondary | ICD-10-CM | POA: Diagnosis not present

## 2019-10-17 DIAGNOSIS — G4730 Sleep apnea, unspecified: Secondary | ICD-10-CM | POA: Diagnosis not present

## 2019-10-17 HISTORY — DX: Unspecified dementia, unspecified severity, without behavioral disturbance, psychotic disturbance, mood disturbance, and anxiety: F03.90

## 2019-10-17 HISTORY — DX: Gastric ulcer, unspecified as acute or chronic, without hemorrhage or perforation: K25.9

## 2019-10-17 LAB — BASIC METABOLIC PANEL
Anion gap: 9 (ref 5–15)
BUN: 120 mg/dL — ABNORMAL HIGH (ref 8–23)
CO2: 15 mmol/L — ABNORMAL LOW (ref 22–32)
Calcium: 7.9 mg/dL — ABNORMAL LOW (ref 8.9–10.3)
Chloride: 113 mmol/L — ABNORMAL HIGH (ref 98–111)
Creatinine, Ser: 2.09 mg/dL — ABNORMAL HIGH (ref 0.61–1.24)
GFR calc Af Amer: 32 mL/min — ABNORMAL LOW (ref >60)
GFR calc non Af Amer: 28 mL/min — ABNORMAL LOW (ref >60)
Glucose, Bld: 107 mg/dL — ABNORMAL HIGH (ref 70–99)
Potassium: 5.9 mmol/L — ABNORMAL HIGH (ref 3.5–5.1)
Sodium: 137 mmol/L (ref 135–145)

## 2019-10-17 LAB — CBC WITH DIFFERENTIAL/PLATELET
Abs Immature Granulocytes: 0.26 10*3/uL — ABNORMAL HIGH (ref 0.00–0.07)
Basophils Absolute: 0 10*3/uL (ref 0.0–0.1)
Basophils Relative: 0 %
Eosinophils Absolute: 0.2 10*3/uL (ref 0.0–0.5)
Eosinophils Relative: 2 %
HCT: 21.7 % — ABNORMAL LOW (ref 39.0–52.0)
Hemoglobin: 6.9 g/dL — CL (ref 13.0–17.0)
Immature Granulocytes: 2 %
Lymphocytes Relative: 21 %
Lymphs Abs: 2.2 10*3/uL (ref 0.7–4.0)
MCH: 30.7 pg (ref 26.0–34.0)
MCHC: 31.8 g/dL (ref 30.0–36.0)
MCV: 96.4 fL (ref 80.0–100.0)
Monocytes Absolute: 0.8 10*3/uL (ref 0.1–1.0)
Monocytes Relative: 7 %
Neutro Abs: 7.3 10*3/uL (ref 1.7–7.7)
Neutrophils Relative %: 68 %
Platelets: 231 10*3/uL (ref 150–400)
RBC: 2.25 MIL/uL — ABNORMAL LOW (ref 4.22–5.81)
RDW: 15.9 % — ABNORMAL HIGH (ref 11.5–15.5)
WBC: 10.7 10*3/uL — ABNORMAL HIGH (ref 4.0–10.5)
nRBC: 0 % (ref 0.0–0.2)

## 2019-10-17 LAB — CBC
HCT: 20.7 % — ABNORMAL LOW (ref 39.0–52.0)
Hemoglobin: 6.6 g/dL — CL (ref 13.0–17.0)
MCH: 30.1 pg (ref 26.0–34.0)
MCHC: 31.9 g/dL (ref 30.0–36.0)
MCV: 94.5 fL (ref 80.0–100.0)
Platelets: 171 10*3/uL (ref 150–400)
RBC: 2.19 MIL/uL — ABNORMAL LOW (ref 4.22–5.81)
RDW: 15.9 % — ABNORMAL HIGH (ref 11.5–15.5)
WBC: 13.6 10*3/uL — ABNORMAL HIGH (ref 4.0–10.5)
nRBC: 0 % (ref 0.0–0.2)

## 2019-10-17 LAB — COMPREHENSIVE METABOLIC PANEL
ALT: 28 U/L (ref 0–44)
AST: 31 U/L (ref 15–41)
Albumin: 2.5 g/dL — ABNORMAL LOW (ref 3.5–5.0)
Alkaline Phosphatase: 64 U/L (ref 38–126)
Anion gap: 7 (ref 5–15)
BUN: 120 mg/dL — ABNORMAL HIGH (ref 8–23)
CO2: 15 mmol/L — ABNORMAL LOW (ref 22–32)
Calcium: 8.1 mg/dL — ABNORMAL LOW (ref 8.9–10.3)
Chloride: 113 mmol/L — ABNORMAL HIGH (ref 98–111)
Creatinine, Ser: 2.12 mg/dL — ABNORMAL HIGH (ref 0.61–1.24)
GFR calc Af Amer: 32 mL/min — ABNORMAL LOW (ref >60)
GFR calc non Af Amer: 27 mL/min — ABNORMAL LOW (ref >60)
Glucose, Bld: 122 mg/dL — ABNORMAL HIGH (ref 70–99)
Potassium: 6.5 mmol/L (ref 3.5–5.1)
Sodium: 135 mmol/L (ref 135–145)
Total Bilirubin: 0.4 mg/dL (ref 0.3–1.2)
Total Protein: 5 g/dL — ABNORMAL LOW (ref 6.5–8.1)

## 2019-10-17 LAB — SARS CORONAVIRUS 2 BY RT PCR (HOSPITAL ORDER, PERFORMED IN ~~LOC~~ HOSPITAL LAB): SARS Coronavirus 2: NEGATIVE

## 2019-10-17 LAB — I-STAT CHEM 8, ED
BUN: 128 mg/dL — ABNORMAL HIGH (ref 8–23)
Calcium, Ion: 1.15 mmol/L (ref 1.15–1.40)
Chloride: 112 mmol/L — ABNORMAL HIGH (ref 98–111)
Creatinine, Ser: 2.2 mg/dL — ABNORMAL HIGH (ref 0.61–1.24)
Glucose, Bld: 116 mg/dL — ABNORMAL HIGH (ref 70–99)
HCT: 17 % — ABNORMAL LOW (ref 39.0–52.0)
Hemoglobin: 5.8 g/dL — CL (ref 13.0–17.0)
Potassium: 6.4 mmol/L (ref 3.5–5.1)
Sodium: 136 mmol/L (ref 135–145)
TCO2: 16 mmol/L — ABNORMAL LOW (ref 22–32)

## 2019-10-17 LAB — PREPARE RBC (CROSSMATCH)

## 2019-10-17 LAB — ABO/RH: ABO/RH(D): O POS

## 2019-10-17 MED ORDER — SODIUM CHLORIDE 0.9 % IV SOLN
8.0000 mg/h | INTRAVENOUS | Status: DC
  Administered 2019-10-17 – 2019-10-19 (×4): 8 mg/h via INTRAVENOUS
  Filled 2019-10-17 (×6): qty 80

## 2019-10-17 MED ORDER — TAMSULOSIN HCL 0.4 MG PO CAPS
0.4000 mg | ORAL_CAPSULE | Freq: Every day | ORAL | Status: DC
  Administered 2019-10-18 – 2019-10-22 (×5): 0.4 mg via ORAL
  Filled 2019-10-17 (×5): qty 1

## 2019-10-17 MED ORDER — PANTOPRAZOLE SODIUM 40 MG IV SOLR: 40.0000 mg | Freq: Once | INTRAVENOUS | Status: DC

## 2019-10-17 MED ORDER — SIMVASTATIN 20 MG PO TABS
40.0000 mg | ORAL_TABLET | Freq: Every day | ORAL | Status: DC
  Administered 2019-10-19 – 2019-10-22 (×4): 40 mg via ORAL
  Filled 2019-10-17 (×5): qty 2

## 2019-10-17 MED ORDER — SODIUM CHLORIDE 0.9% FLUSH
3.0000 mL | Freq: Two times a day (BID) | INTRAVENOUS | Status: DC
  Administered 2019-10-18 – 2019-10-22 (×6): 3 mL via INTRAVENOUS

## 2019-10-17 MED ORDER — HYDRALAZINE HCL 50 MG PO TABS
50.0000 mg | ORAL_TABLET | Freq: Three times a day (TID) | ORAL | Status: DC
  Administered 2019-10-17 – 2019-10-22 (×16): 50 mg via ORAL
  Filled 2019-10-17 (×4): qty 1
  Filled 2019-10-17: qty 2
  Filled 2019-10-17 (×3): qty 1
  Filled 2019-10-17: qty 2
  Filled 2019-10-17 (×7): qty 1

## 2019-10-17 MED ORDER — PANTOPRAZOLE SODIUM 40 MG IV SOLR
40.0000 mg | Freq: Two times a day (BID) | INTRAVENOUS | Status: DC
  Administered 2019-10-17: 40 mg via INTRAVENOUS
  Filled 2019-10-17: qty 40

## 2019-10-17 MED ORDER — PANTOPRAZOLE SODIUM 40 MG IV SOLR: 40.0000 mg | Freq: Two times a day (BID) | INTRAVENOUS | Status: DC

## 2019-10-17 MED ORDER — LACTATED RINGERS IV SOLN: INTRAVENOUS | Status: DC

## 2019-10-17 MED ORDER — SODIUM CHLORIDE 0.9 % IV SOLN
10.0000 mL/h | Freq: Once | INTRAVENOUS | Status: AC
  Administered 2019-10-17: 10 mL/h via INTRAVENOUS

## 2019-10-17 MED ORDER — ACETAMINOPHEN 650 MG RE SUPP: 650.0000 mg | Freq: Four times a day (QID) | RECTAL | Status: DC | PRN

## 2019-10-17 MED ORDER — ONDANSETRON HCL 4 MG/2ML IJ SOLN
4.0000 mg | Freq: Once | INTRAMUSCULAR | Status: AC
  Administered 2019-10-17: 4 mg via INTRAVENOUS
  Filled 2019-10-17: qty 2

## 2019-10-17 MED ORDER — ISOSORBIDE DINITRATE 10 MG PO TABS
30.0000 mg | ORAL_TABLET | Freq: Three times a day (TID) | ORAL | Status: DC
  Administered 2019-10-17 – 2019-10-21 (×13): 30 mg via ORAL
  Administered 2019-10-21: 10 mg via ORAL
  Administered 2019-10-22 (×2): 30 mg via ORAL
  Filled 2019-10-17: qty 3
  Filled 2019-10-17 (×2): qty 1
  Filled 2019-10-17 (×7): qty 3
  Filled 2019-10-17: qty 1
  Filled 2019-10-17 (×6): qty 3

## 2019-10-17 MED ORDER — ONDANSETRON HCL 4 MG PO TABS: 4.0000 mg | ORAL_TABLET | Freq: Four times a day (QID) | ORAL | Status: DC | PRN

## 2019-10-17 MED ORDER — SODIUM CHLORIDE 0.9 % IV BOLUS
1000.0000 mL | Freq: Once | INTRAVENOUS | Status: AC
  Administered 2019-10-17: 1000 mL via INTRAVENOUS

## 2019-10-17 MED ORDER — ACETAMINOPHEN 325 MG PO TABS: 650.0000 mg | ORAL_TABLET | Freq: Four times a day (QID) | ORAL | Status: DC | PRN

## 2019-10-17 MED ORDER — ONDANSETRON HCL 4 MG/2ML IJ SOLN
4.0000 mg | Freq: Four times a day (QID) | INTRAMUSCULAR | Status: DC | PRN
  Administered 2019-10-18: 4 mg via INTRAVENOUS

## 2019-10-17 MED ORDER — SODIUM ZIRCONIUM CYCLOSILICATE 10 G PO PACK
10.0000 g | PACK | Freq: Once | ORAL | Status: AC
  Administered 2019-10-17: 10 g via ORAL
  Filled 2019-10-17: qty 1

## 2019-10-17 MED ORDER — LATANOPROST 0.005 % OP SOLN
1.0000 [drp] | Freq: Every day | OPHTHALMIC | Status: DC
  Administered 2019-10-17 – 2019-10-21 (×5): 1 [drp] via OPHTHALMIC
  Filled 2019-10-17: qty 2.5

## 2019-10-17 NOTE — ED Notes (Signed)
Critical potassium results given to Dr Tyrone Nine

## 2019-10-17 NOTE — Telephone Encounter (Signed)
done

## 2019-10-17 NOTE — ED Notes (Signed)
Date and time results received: 10/17/19 1018 (use smartphrase ".now" to insert current time)  Test: potassium  Critical Value: 6.5  Name of Provider Notified: DR Lorin Mercy   Orders Received? Or Actions Taken?: Orders Received - See Orders for details

## 2019-10-17 NOTE — ED Notes (Signed)
Pt getting is blood transfusion that started a 1153, unable to collect any labs until 2 hours after blood transfusion completed, per blood transfusion policy.

## 2019-10-17 NOTE — Consult Note (Signed)
Subjective:   HPI  The patient is an 86 year old male who was seen today in the emergency department in consultation.  His wife is present also.  He came to the emergency department because of melenic stool he had this morning and coffee-ground emesis.  Wife states he passed out at home.  Currently feels fine.  He does have a history of peptic ulcer disease in years past.  Review of Systems  Denies chest pain or shortness of breath  Past Medical History:  Diagnosis Date  . CVA (cerebral infarction) 12/2013  . Dementia ([place])   . Gastric ulcer    prior to 2000  . Hypertension   . Sleep difficulties    Past Surgical History:  Procedure Laterality Date  . ABDOMINAL SURGERY    . CATARACT EXTRACTION Left    Social History   Socioeconomic History  . Marital status: Married    Spouse name: [name]  . Number of children: 2  . Years of education: college  . Highest education level: Not on file  Occupational History    Comment: retired  Tobacco Use  . Smoking status: Never Smoker  . Smokeless tobacco: Never Used  Vaping Use  . Vaping Use: Never used  Substance and Sexual Activity  . Alcohol use: No    Alcohol/week: 0.0 standard drinks  . Drug use: No  . Sexual activity: Not on file  Other Topics Concern  . Not on file  Social History Narrative   Patient lives at home with his wife Brian Wolf.   Retired.   Education Retail buyer.   Right handed.   Caffeine two cups of coffee daily.   Social Determinants of Health   Financial Resource Strain:   . Difficulty of Paying Living Expenses: Not on file  Food Insecurity:   . Worried About Charity fundraiser in the Last Year: Not on file  . Ran Out of Food in the Last Year: Not on file  Transportation Needs:   . Lack of Transportation (Medical): Not on file  . Lack of Transportation (Non-Medical): Not on file  Physical Activity:   . Days of Exercise per Week: Not on file  . Minutes of Exercise per Session: Not on file   Stress:   . Feeling of Stress : Not on file  Social Connections:   . Frequency of Communication with Friends and Family: Not on file  . Frequency of Social Gatherings with Friends and Family: Not on file  . Attends Religious Services: Not on file  . Active Member of Clubs or Organizations: Not on file  . Attends Archivist Meetings: Not on file  . Marital Status: Not on file  Intimate Partner Violence:   . Fear of Current or Ex-Partner: Not on file  . Emotionally Abused: Not on file  . Physically Abused: Not on file  . Sexually Abused: Not on file   family history includes Hyperlipidemia in his mother; Hypertension in his father and mother.  Current Facility-Administered Medications:  .  acetaminophen (TYLENOL) tablet 650 mg, 650 mg, Oral, Q6H PRN **OR** acetaminophen (TYLENOL) suppository 650 mg, 650 mg, Rectal, Q6H PRN, Karmen Bongo, MD .  hydrALAZINE (APRESOLINE) tablet 50 mg, 50 mg, Oral, TID, Karmen Bongo, MD .  isosorbide dinitrate (ISORDIL) tablet 30 mg, 30 mg, Oral, TID, Karmen Bongo, MD .  lactated ringers infusion, , Intravenous, Continuous, Karmen Bongo, MD .  latanoprost (XALATAN) 0.005 % ophthalmic solution 1 drop, 1 drop, Both Eyes, QHS, Karmen Bongo,  MD .  ondansetron (ZOFRAN) tablet 4 mg, 4 mg, Oral, Q6H PRN **OR** ondansetron (ZOFRAN) injection 4 mg, 4 mg, Intravenous, Q6H PRN, Karmen Bongo, MD .  pantoprazole (PROTONIX) 80 mg in sodium chloride 0.9 % 100 mL (0.8 mg/mL) infusion, 8 mg/hr, Intravenous, Continuous, Karmen Bongo, MD .  Derrill Memo ON 10/20/2019] pantoprazole (PROTONIX) injection 40 mg, 40 mg, Intravenous, Q12H, Karmen Bongo, MD .  pantoprazole (PROTONIX) injection 40 mg, 40 mg, Intravenous, Once, Karmen Bongo, MD .  simvastatin (ZOCOR) tablet 40 mg, 40 mg, Oral, Daily, Karmen Bongo, MD .  sodium chloride flush (NS) 0.9 % injection 3 mL, 3 mL, Intravenous, Q12H, Karmen Bongo, MD .  tamsulosin [facility]) capsule 0.4 mg,  0.4 mg, Oral, Daily, Karmen Bongo, MD  Current Outpatient Medications:  .  aspirin 81 MG tablet, Take 81 mg by mouth daily., Disp: , Rfl:  .  fexofenadine (ALLEGRA) 30 MG tablet, Take 30 mg by mouth daily as needed (allergies). , Disp: , Rfl:  .  hydrALAZINE (APRESOLINE) 50 MG tablet, Take 1 tablet (50 mg total) by mouth 3 (three) times daily., Disp: 30 tablet, Rfl: 0 .  isosorbide dinitrate (ISORDIL) 30 MG tablet, Take 1 tablet (30 mg total) by mouth 3 (three) times daily., Disp: 90 tablet, Rfl: 2 .  Probiotic Product (PROBIOTIC DAILY PO), Take 1 capsule by mouth daily. , Disp: , Rfl:  .  simvastatin (ZOCOR) 40 MG tablet, Take 40 mg by mouth daily., Disp: , Rfl:  .  spironolactone (ALDACTONE) 25 MG tablet, Take 25 mg by mouth daily., Disp: , Rfl:  .  tamsulosin (FLOMAX) 0.4 MG CAPS capsule, Take 0.4 mg by mouth daily. 30 minutes after each meal, Disp: , Rfl:  .  Travoprost, BAK Free, (TRAVATAN) 0.004 % SOLN ophthalmic solution, Place 1 drop into both eyes at bedtime. , Disp: , Rfl:  No Known Allergies   Objective:     BP (!) 113/46   Pulse 84   Temp 99.5 F (37.5 C) (Oral)   Resp 18   Ht 5\' 5"  (1.651 m)   Wt 66.7 kg   SpO2 99%   BMI 24.46 kg/m   No distress  Heart regular rhythm no murmurs  Lungs clear  Abdomen soft and nontender  Laboratory No components found for: D1    Assessment:     Upper GI bleed characterized by coffee-ground emesis and melena      Plan:     Stabilize patient.  IV fluids.  PPI.  We will plan endoscopy later today.

## 2019-10-17 NOTE — ED Notes (Signed)
Help get patient straighten up in the bed patient used the urinal patient is now resting with family and call bell at bedside

## 2019-10-17 NOTE — ED Notes (Signed)
Pt provided multiple warm blankets at this time, pt to be placed on bear hugger at this time.

## 2019-10-17 NOTE — Progress Notes (Signed)
Plan for EGD tomorrow morning

## 2019-10-17 NOTE — ED Provider Notes (Signed)
[facility] EMERGENCY DEPARTMENT Provider Note   CSN: [id number] Arrival date & time: 10/17/19  8185     History Chief Complaint  Patient presents with  . Altered Mental Status  . GI Bleeding    Brian Wolf is a 86 y.o. male.  86 yo M with a cc of dark and tarry stools and coffee ground emesis.  Not sure when this started.  Denies abdominal pain. Not sure if this has happened before, not sure if he has a GI doc. Doesn't think he has liver dysfunction.  Level V caveat dementia.   The history is provided by the patient.  Altered Mental Status Presenting symptoms: no confusion   Associated symptoms: nausea and vomiting   Associated symptoms: no abdominal pain, no fever, no headaches, no palpitations and no rash   Illness Severity:  Moderate Onset quality:  Sudden Duration:  12 hours Timing:  Constant Progression:  Unchanged Chronicity:  New Associated symptoms: nausea and vomiting   Associated symptoms: no abdominal pain, no chest pain, no congestion, no diarrhea, no fever, no headaches, no myalgias, no rash and no shortness of breath        Past Medical History:  Diagnosis Date  . CVA (cerebral infarction) 12/2013  . Hypertension   . Sleep difficulties     Patient Active Problem List   Diagnosis Date Noted  . Acute upper GI bleed 10/17/2019  . Fall   . History of stroke 03/09/2016  . Sleep apnea 03/09/2016  . SAH (subarachnoid hemorrhage) ([place]) 03/09/2016  . Subarachnoid bleed ([place]) 03/08/2016  . Essential hypertension   . Sleep difficulties   . Homonymous hemianopsia 01/14/2014  . Homonymous hemianopsia due to recent cerebral infarction 12/24/2013  . Left-sided neglect 12/24/2013  . Gout flare 12/20/2013  . Benign essential HTN 12/20/2013  . Acute hemorrhagic infarction of brain ([place]) 12/20/2013  . Hemorrhagic stroke ([place])   . Other secondary acute gout of left foot   . Hypokalemia 12/19/2013  . Altered mental status   .  Encounter for central line placement   . Encephalopathy acute   . ICH (intracerebral hemorrhage) ([place]) 12/14/2013  . Fever     Past Surgical History:  Procedure Laterality Date  . ABDOMINAL SURGERY    . CATARACT EXTRACTION Left        Family History  Problem Relation Age of Onset  . Hyperlipidemia Mother   . Hypertension Mother   . Hypertension Father     Social History   Tobacco Use  . Smoking status: Never Smoker  . Smokeless tobacco: Never Used  Vaping Use  . Vaping Use: Never used  Substance Use Topics  . Alcohol use: No    Alcohol/week: 0.0 standard drinks  . Drug use: No    Home Medications Prior to Admission medications   Medication Sig Start Date End Date Taking? Authorizing Provider  aspirin 81 MG tablet Take 81 mg by mouth daily.    [provider]  fexofenadine (ALLEGRA) 30 MG tablet Take 30 mg by mouth 2 (two) times daily.    [provider]  hydrALAZINE (APRESOLINE) 50 MG tablet Take 1 tablet (50 mg total) by mouth 3 (three) times daily. 09/12/19   Adrian Prows, MD  isosorbide dinitrate (ISORDIL) 30 MG tablet Take 1 tablet (30 mg total) by mouth 3 (three) times daily. 09/03/19   Adrian Prows, MD  Probiotic Product (PROBIOTIC DAILY PO) Take 1 capsule by mouth daily.     [provider]  simvastatin (ZOCOR) 40 MG tablet Take 40 mg by mouth daily. 11/30/13   [provider]  spironolactone (ALDACTONE) 25 MG tablet Take 25 mg by mouth daily. 08/20/19   [provider]  tamsulosin (FLOMAX) 0.4 MG CAPS capsule Take 0.4 mg by mouth daily. 30 minutes after each meal 06/03/16   [provider]  Travoprost, BAK Free, (TRAVATAN) 0.004 % SOLN ophthalmic solution 1 drop at bedtime.    [provider]    Allergies    Patient has no known allergies.  Review of Systems   Review of Systems  Constitutional: Negative for chills and fever.  HENT: Negative for congestion and facial swelling.   Eyes: Negative for  discharge and visual disturbance.  Respiratory: Negative for shortness of breath.   Cardiovascular: Negative for chest pain and palpitations.  Gastrointestinal: Positive for blood in stool, nausea and vomiting. Negative for abdominal pain and diarrhea.  Musculoskeletal: Negative for arthralgias and myalgias.  Skin: Negative for color change and rash.  Neurological: Negative for tremors, syncope and headaches.  Psychiatric/Behavioral: Negative for confusion and dysphoric mood.    Physical Exam Updated Vital Signs BP (!) 158/57   Pulse 77   Temp (!) 96.2 F (35.7 C) (Axillary)   Resp 19   Ht 5\' 5"  (1.651 m)   Wt 66.7 kg   SpO2 100%   BMI 24.46 kg/m   Physical Exam Vitals and nursing note reviewed.  Constitutional:      Appearance: He is well-developed.     Comments: pale  HENT:     Head: Normocephalic and atraumatic.  Eyes:     Pupils: Pupils are equal, round, and reactive to light.  Neck:     Vascular: No JVD.  Cardiovascular:     Rate and Rhythm: Normal rate and regular rhythm.     Heart sounds: No murmur heard.  No friction rub. No gallop.   Pulmonary:     Effort: No respiratory distress.     Breath sounds: No wheezing.  Abdominal:     General: There is no distension.     Tenderness: There is no abdominal tenderness. There is no guarding or rebound.  Genitourinary:    Comments: No appreciable hemorrhoids, melena on exam Musculoskeletal:        General: Normal range of motion.     Cervical back: Normal range of motion and neck supple.  Skin:    Coloration: Skin is not pale.     Findings: No rash.  Neurological:     Mental Status: He is alert.     Comments: Confused response  Psychiatric:        Behavior: Behavior normal.     ED Results / Procedures / Treatments   Labs (all labs ordered are listed, but only abnormal results are displayed) Labs Reviewed  CBC WITH DIFFERENTIAL/PLATELET - Abnormal; Notable for the following components:      Result Value    WBC 10.7 (*)    RBC 2.25 (*)    Hemoglobin 6.9 (*)    HCT 21.7 (*)    RDW 15.9 (*)    Abs Immature Granulocytes 0.26 (*)    All other components within normal limits  I-STAT CHEM 8, ED - Abnormal; Notable for the following components:   Potassium 6.4 (*)    Chloride 112 (*)    BUN 128 (*)    Creatinine, Ser 2.20 (*)    Glucose, Bld 116 (*)    TCO2 16 (*)  Hemoglobin 5.8 (*)    HCT 17.0 (*)    All other components within normal limits  SARS CORONAVIRUS 2 BY RT PCR (HOSPITAL ORDER, [facility])  COMPREHENSIVE METABOLIC PANEL  TYPE AND SCREEN  ABO/RH  PREPARE RBC (CROSSMATCH)    EKG EKG Interpretation  Date/Time:  Wednesday October 17 2019 06:48:26 EDT Ventricular Rate:  71 PR Interval:    QRS Duration: 93 QT Interval:  393 QTC Calculation: 428 R Axis:   13 Text Interpretation: Sinus or ectopic atrial rhythm Atrial premature complex Inferior infarct, old Lateral leads are also involved No significant change since last tracing Confirmed by Deno Etienne [id number]) on 10/17/2019 7:09:49 AM   Radiology No results found.  Procedures Procedures (including critical care time)  Medications Ordered in ED Medications  pantoprazole (PROTONIX) injection 40 mg (40 mg Intravenous Given 10/17/19 0740)  0.9 %  sodium chloride infusion (has no administration in time range)  sodium chloride 0.9 % bolus 1,000 mL (0 mLs Intravenous Stopped 10/17/19 0906)  ondansetron (ZOFRAN) injection 4 mg (4 mg Intravenous Given 10/17/19 0739)  sodium zirconium cyclosilicate (LOKELMA) packet 10 g (10 g Oral Given 10/17/19 0944)    ED Course  I have reviewed the triage vital signs and the nursing notes.  Pertinent labs & imaging results that were available during my care of the patient were reviewed by me and considered in my medical decision making (see chart for details).    MDM Rules/Calculators/A&P                          86 yo M with a chief complaints of dark and  started stool and coffee-ground emesis.  He is not sure when this started but would for EMS started this morning.  He is hypothermic here.  Will place some warm blankets.  Obtain a laboratory evaluation, start on Protonix.  Reassess.  Family was able to provide some further history.  They had not noticed any dark stool or blood in his stool though states that he has been not really communicated with her about these issues.  She is not sure exactly how long its been going on.  States that he has been confused a bit today that she thinks it is due to the amount of blood loss.  She found him in the bathroom today and he had apparently syncopized and had an episode of coffee-ground emesis and there was dark and tarry stool in the bowl.  He is on a baby aspirin but no other blood thinner.  Unfortunately initial CMP was unable to be run.  Blood redrawn and sent. Message sent to [facility] for consult per protocol.   Istat chem 8 with aki, uremia.  No ecg changes concerning for hyperkalemia.  Give dose of Lokelma.  Will discuss with medicine for admission.  CRITICAL CARE Performed by: Cecilio Asper   Total critical care time: 35 minutes  Critical care time was exclusive of separately billable procedures and treating other patients.  Critical care was necessary to treat or prevent imminent or life-threatening deterioration.  Critical care was time spent personally by me on the following activities: development of treatment plan with patient and/or surrogate as well as nursing, discussions with consultants, evaluation of patient's response to treatment, examination of patient, obtaining history from patient or surrogate, ordering and performing treatments and interventions, ordering and review of laboratory studies, ordering and review of radiographic studies, pulse oximetry  and re-evaluation of patient's condition.  The patients results and plan were reviewed and discussed.   Any x-rays performed  were independently reviewed by myself.   Differential diagnosis were considered with the presenting HPI.  Medications  pantoprazole (PROTONIX) injection 40 mg (40 mg Intravenous Given 10/17/19 0740)  0.9 %  sodium chloride infusion (has no administration in time range)  sodium chloride 0.9 % bolus 1,000 mL (0 mLs Intravenous Stopped 10/17/19 0906)  ondansetron (ZOFRAN) injection 4 mg (4 mg Intravenous Given 10/17/19 0739)  sodium zirconium cyclosilicate (LOKELMA) packet 10 g (10 g Oral Given 10/17/19 0944)    Vitals:   10/17/19 0745 10/17/19 0800 10/17/19 0815 10/17/19 0830  BP: 138/63 (!) 152/60 (!) 155/59 (!) 158/57  Pulse: 89 86 77 77  Resp: 18 18 18 19   Temp:      TempSrc:      SpO2: 100% 99% 98% 100%  Weight:      Height:        Final diagnoses:  Upper GI bleed  Symptomatic anemia  AKI (acute kidney injury) ([place])  Uremia  Hyperkalemia    Admission/ observation were discussed with the admitting physician, patient and/or family and they are comfortable with the plan.    Final Clinical Impression(s) / ED Diagnoses Final diagnoses:  Upper GI bleed  Symptomatic anemia  AKI (acute kidney injury) ([place])  Uremia  Hyperkalemia    Rx / DC Orders ED Discharge Orders    None       Deno Etienne, DO 10/17/19 [phone]

## 2019-10-17 NOTE — H&P (Addendum)
History and Physical    Brian Wolf DVV:[id number] DOB: July 25, 1931 DOA: 10/17/2019  PCP: [name], DO Consultants:  [place] - cardiology; [facility]letta [facility] - GI Patient coming from:  Home - lives with wife; NOK: Wife, [phone]  Chief Complaint: GI bleeding  HPI: Brian Wolf is a 86 y.o. male with medical history significant of CVA (2015); HTN; bradycardia due to sinus node dysfunction; possible dementia; stage 3b CKD; and OSA on CPAP presenting with GI bleeding.  He has had BP problems since the middle of May - refractory, mostly associated with elevated BP but some lability.  He has seen cardiology and EP.  He is wearing a monitor for 2 weeks (1 week in).  He was "lazy" yesterday and laid around all day.  He got up about 0400 this AM and he went to bathroom and again about 0430.  He had a BM and passed out and she called 911.  He also vomiting - with blood in both stool and emesis.  Total emesis x 2-3 times.  No further BMs in the ER.  He does not complain - ever.  He generally sleeps a lot and is cold all the time, has been slowing down physically since May.    ED Course:   Syncopal event on the toilet.  Uncertain about length of bleeding.  Mildly confused - blood loss and/or dementia.  Melena at home with coffee ground emesis.  Gross melena on exam.  Given blood x 1 unit and IV Protonix.  [facility] (Dr. Penelope Coop) is seeing now.  On ASA only from prior CVA.  Review of Systems: As per HPI; otherwise review of systems reviewed and negative.   Ambulatory Status:  Ambulates without assistance  COVID Vaccine Status:   Complete  Past Medical History:  Diagnosis Date  . CVA (cerebral infarction) 12/2013  . Dementia ([place])   . Gastric ulcer    prior to 2000  . Hypertension   . Sleep difficulties     Past Surgical History:  Procedure Laterality Date  . ABDOMINAL SURGERY    . CATARACT EXTRACTION Left     Social History   Socioeconomic History  . Marital status: Married     Spouse name: Brian Wolf  . Number of children: 2  . Years of education: college  . Highest education level: Not on file  Occupational History    Comment: retired  Tobacco Use  . Smoking status: Never Smoker  . Smokeless tobacco: Never Used  Vaping Use  . Vaping Use: Never used  Substance and Sexual Activity  . Alcohol use: No    Alcohol/week: 0.0 standard drinks  . Drug use: No  . Sexual activity: Not on file  Other Topics Concern  . Not on file  Social History Narrative   Patient lives at home with his wife Brian Wolf.   Retired.   Education Retail buyer.   Right handed.   Caffeine two cups of coffee daily.   Social Determinants of Health   Financial Resource Strain:   . Difficulty of Paying Living Expenses: Not on file  Food Insecurity:   . Worried About Charity fundraiser in the Last Year: Not on file  . Ran Out of Food in the Last Year: Not on file  Transportation Needs:   . Lack of Transportation (Medical): Not on file  . Lack of Transportation (Non-Medical): Not on file  Physical Activity:   . Days of Exercise per Week: Not on file  . Minutes  of Exercise per Session: Not on file  Stress:   . Feeling of Stress : Not on file  Social Connections:   . Frequency of Communication with Friends and Family: Not on file  . Frequency of Social Gatherings with Friends and Family: Not on file  . Attends Religious Services: Not on file  . Active Member of Clubs or Organizations: Not on file  . Attends Archivist Meetings: Not on file  . Marital Status: Not on file  Intimate Partner Violence:   . Fear of Current or Ex-Partner: Not on file  . Emotionally Abused: Not on file  . Physically Abused: Not on file  . Sexually Abused: Not on file    No Known Allergies  Family History  Problem Relation Age of Onset  . Hyperlipidemia Mother   . Hypertension Mother   . Hypertension Father     Prior to Admission medications   Medication Sig Start Date End Date Taking?  Authorizing Provider  aspirin 81 MG tablet Take 81 mg by mouth daily.    [provider]  fexofenadine (ALLEGRA) 30 MG tablet Take 30 mg by mouth 2 (two) times daily.    [provider]  hydrALAZINE (APRESOLINE) 50 MG tablet Take 1 tablet (50 mg total) by mouth 3 (three) times daily. 09/12/19   Adrian Prows, MD  isosorbide dinitrate (ISORDIL) 30 MG tablet Take 1 tablet (30 mg total) by mouth 3 (three) times daily. 09/03/19   Adrian Prows, MD  Probiotic Product (PROBIOTIC DAILY PO) Take 1 capsule by mouth daily.     [provider]  simvastatin (ZOCOR) 40 MG tablet Take 40 mg by mouth daily. 11/30/13   [provider]  spironolactone (ALDACTONE) 25 MG tablet Take 25 mg by mouth daily. 08/20/19   [provider]  tamsulosin (FLOMAX) 0.4 MG CAPS capsule Take 0.4 mg by mouth daily. 30 minutes after each meal 06/03/16   [provider]  Travoprost, BAK Free, (TRAVATAN) 0.004 % SOLN ophthalmic solution 1 drop at bedtime.    [provider]    Physical Exam: Vitals:   10/17/19 1245 10/17/19 1305 10/17/19 1310 10/17/19 1315  BP: (!) 147/64 (!) 149/54    Pulse: 68 71 74 88  Resp: 19 (!) 21 (!) 22 (!) 22  Temp:      TempSrc:      SpO2: 96% 99% 100% 99%  Weight:      Height:         . General:  Appears calm and comfortable and is NAD . Eyes:  PERRL, EOMI, normal lids, iris . ENT:  grossly normal hearing, lips & tongue, mmm . Neck:  no LAD, masses or thyromegaly . Cardiovascular:  RRR, no m/r/g. No LE edema.  Marland Kitchen Respiratory:   CTA bilaterally with no wheezes/rales/rhonchi.  Normal respiratory effort. . Abdomen:  soft, NT, ND, NABS . Skin:  no rash or induration seen on limited exam . Musculoskeletal:  grossly normal tone BUE/BLE, good ROM, no bony abnormality . Psychiatric:  blunted mood and affect, speech fluent and appropriate, AOx2 . Neurologic:  CN 2-12 grossly intact, moves all extremities in coordinated fashion    Radiological  Exams on Admission: No results found.  EKG: Independently reviewed.  NSR with rate 71; nonspecific ST changes with no evidence of acute ischemia; NSCSLT   Labs on Admission: I have personally reviewed the available labs and imaging studies at the time of the admission.  Pertinent labs:   K+ 6.4  Glucose 116 BUN 128/Creatinine 2.20; 30/1.87/32 on 5/25 WBC 10.7 Hgb 6.9; 12.4 on 5/25 COVID negative   Assessment/Plan Principal Problem:   Acute upper GI bleed Active Problems:   Benign essential HTN   History of stroke   Hyperkalemia   Acute kidney injury superimposed on CKD ([place])   DNR (do not resuscitate)    Acute upper GI bleed, ABLA -Patient's syncopal episode on the commode was most likely caused by anemia secondary to upper GI bleeding.  -Patient has history of a very remote gastric ulcer; wife denies use of NSAIDs -Most likely diagnosis is gastric or duodenal ulcer, although the patient has no abdominal pain.  -His Hgb decreased from 812.4 on 5/25 to 6.9 today.  -The patient is not tachycardic with normal blood pressure, suggesting subacute volume loss.  -Type and screen were done in ED.  -One unit of blood was ordered by ED.  -will admit to progressive bed (was hypothermic and required Bair hugger on arrival) -GI consulted by ED, will follow up recommendations -NPO for possible EGD -NS at 100 mL/hr -Start IV pantoprazole bolus and infusion -Zofran IV for nausea -Avoid NSAIDs and SQ heparin -Maintain IV access (2 large bore IVs if possible). -Monitor closely and follow cbc, transfuse as necessary. -Hold ASA for now  AKI on stage 3b CKD -Likely associated with acute or subacute volume loss in the setting of GI bleed -Will provide gentle IVF hydration and recheck later this PM and in AM tomorrow -Hold Aldactone  Hyperkalemia -Possibly associated with AKI -Patient was given Lokelma in the ER as well as IVF -Will recheck this PM and in AM -Hold  Aldactone  HTN -Continue Hydralazine -Hold Aldactone  H/o CVA -Hold ASA while actively bleeding -Continue Isordil -Continue Zocor     Note: This patient has been tested and is negative for the novel coronavirus COVID-19.  DVT prophylaxis:  SCDs Code Status:  DNR - confirmed with family Family Communication: Wife was present throughout evaluation Disposition Plan:  The patient is from: home  Anticipated d/c is to: home without [facility] services  Anticipated d/c date will depend on clinical response to treatment, but likely 2-3 days  Patient is currently: acutely ill Consults called: GI Admission status:  Admit - It is my clinical opinion that admission to INPATIENT is reasonable and necessary because of the expectation that this patient will require hospital care that crosses at least 2 midnights to treat this condition based on the medical complexity of the problems presented.  Given the aforementioned information, the predictability of an adverse outcome is felt to be significant.    Karmen Bongo MD [facility]   How to contact the [facility] Attending or Consulting provider [place] or covering provider during after hours [place], for this patient?  1. Check the care team in [facility] and look for a) attending/consulting [facility] provider listed and b) the [facility] team listed 2. Log into [email or web address] and use [place]'s universal password to access. If you do not have the password, please contact the hospital operator. 3. Locate the [facility] provider you are looking for under [facility] and page to a number that you can be directly reached. 4. If you still have difficulty reaching the provider, please page the [facility] (Director on Call) for the Hospitalists listed on amion for assistance.   10/17/2019, 1:27 PM

## 2019-10-17 NOTE — ED Triage Notes (Signed)
EMS arrival from home co upper and lower GI bleed starting this morning. Per EMS pt spouse reports coffee ground emesis and dark tarry stools. GCS 12-13 in route. HX CVA, HTN, Gastric ulcers. 20 G forearm given 4mg  Zofran

## 2019-10-18 ENCOUNTER — Encounter (HOSPITAL_COMMUNITY): Payer: Self-pay | Admitting: Internal Medicine

## 2019-10-18 ENCOUNTER — Encounter (HOSPITAL_COMMUNITY)
Admission: EM | Disposition: A | Discharge status: Skilled Nursing Facility | Payer: Self-pay | Source: Home / Self Care | Attending: Family Medicine

## 2019-10-18 ENCOUNTER — Inpatient Hospital Stay (HOSPITAL_COMMUNITY): Payer: Medicare Other | Admitting: Certified Registered"

## 2019-10-18 HISTORY — PX: ESOPHAGOGASTRODUODENOSCOPY: SHX5428

## 2019-10-18 LAB — BASIC METABOLIC PANEL
Anion gap: 5 (ref 5–15)
BUN: 108 mg/dL — ABNORMAL HIGH (ref 8–23)
CO2: 18 mmol/L — ABNORMAL LOW (ref 22–32)
Calcium: 8.3 mg/dL — ABNORMAL LOW (ref 8.9–10.3)
Chloride: 115 mmol/L — ABNORMAL HIGH (ref 98–111)
Creatinine, Ser: 2.2 mg/dL — ABNORMAL HIGH (ref 0.61–1.24)
GFR calc Af Amer: 30 mL/min — ABNORMAL LOW (ref >60)
GFR calc non Af Amer: 26 mL/min — ABNORMAL LOW (ref >60)
Glucose, Bld: 96 mg/dL (ref 70–99)
Potassium: 5.7 mmol/L — ABNORMAL HIGH (ref 3.5–5.1)
Sodium: 138 mmol/L (ref 135–145)

## 2019-10-18 LAB — CBC
HCT: 19 % — ABNORMAL LOW (ref 39.0–52.0)
HCT: 26.8 % — ABNORMAL LOW (ref 39.0–52.0)
Hemoglobin: 6.3 g/dL — CL (ref 13.0–17.0)
Hemoglobin: 8.9 g/dL — ABNORMAL LOW (ref 13.0–17.0)
MCH: 30.6 pg (ref 26.0–34.0)
MCH: 30 pg (ref 26.0–34.0)
MCHC: 33.2 g/dL (ref 30.0–36.0)
MCHC: 33.2 g/dL (ref 30.0–36.0)
MCV: 92.2 fL (ref 80.0–100.0)
MCV: 90.2 fL (ref 80.0–100.0)
Platelets: 179 10*3/uL (ref 150–400)
Platelets: 177 10*3/uL (ref 150–400)
RBC: 2.06 MIL/uL — ABNORMAL LOW (ref 4.22–5.81)
RBC: 2.97 MIL/uL — ABNORMAL LOW (ref 4.22–5.81)
RDW: 16.4 % — ABNORMAL HIGH (ref 11.5–15.5)
RDW: 15.6 % — ABNORMAL HIGH (ref 11.5–15.5)
WBC: 10.7 10*3/uL — ABNORMAL HIGH (ref 4.0–10.5)
WBC: 10.4 10*3/uL (ref 4.0–10.5)
nRBC: 0.2 % (ref 0.0–0.2)
nRBC: 0 % (ref 0.0–0.2)

## 2019-10-18 LAB — POCT I-STAT, CHEM 8
BUN: 108 mg/dL — ABNORMAL HIGH (ref 8–23)
Calcium, Ion: 1.25 mmol/L (ref 1.15–1.40)
Chloride: 113 mmol/L — ABNORMAL HIGH (ref 98–111)
Creatinine, Ser: 2.2 mg/dL — ABNORMAL HIGH (ref 0.61–1.24)
Glucose, Bld: 93 mg/dL (ref 70–99)
HCT: 21 % — ABNORMAL LOW (ref 39.0–52.0)
Hemoglobin: 7.1 g/dL — ABNORMAL LOW (ref 13.0–17.0)
Potassium: 5.8 mmol/L — ABNORMAL HIGH (ref 3.5–5.1)
Sodium: 140 mmol/L (ref 135–145)
TCO2: 17 mmol/L — ABNORMAL LOW (ref 22–32)

## 2019-10-18 LAB — PREPARE RBC (CROSSMATCH)

## 2019-10-18 SURGERY — EGD (ESOPHAGOGASTRODUODENOSCOPY)
Anesthesia: General

## 2019-10-18 MED ORDER — LIDOCAINE 2% (20 MG/ML) 5 ML SYRINGE
INTRAMUSCULAR | Status: DC | PRN
  Administered 2019-10-18: 20 mg via INTRAVENOUS

## 2019-10-18 MED ORDER — SODIUM CHLORIDE 0.9% IV SOLUTION: Freq: Once | INTRAVENOUS | Status: DC

## 2019-10-18 MED ORDER — PROPOFOL 10 MG/ML IV BOLUS
INTRAVENOUS | Status: DC | PRN
  Administered 2019-10-18: 100 mg via INTRAVENOUS

## 2019-10-18 MED ORDER — ROCURONIUM BROMIDE 10 MG/ML (PF) SYRINGE
PREFILLED_SYRINGE | INTRAVENOUS | Status: DC | PRN
  Administered 2019-10-18: 50 mg via INTRAVENOUS

## 2019-10-18 MED ORDER — LACTATED RINGERS IV SOLN: INTRAVENOUS | Status: DC | PRN

## 2019-10-18 MED ORDER — SUGAMMADEX SODIUM 200 MG/2ML IV SOLN
INTRAVENOUS | Status: DC | PRN
  Administered 2019-10-18: 200 mg via INTRAVENOUS

## 2019-10-18 NOTE — Anesthesia Procedure Notes (Addendum)
Procedure Name: Intubation Date/Time: 10/18/2019 10:56 AM Performed by: Teressa Lower., CRNA Pre-anesthesia Checklist: Patient identified, Emergency Drugs available, Suction available, Patient being monitored and Timeout performed Patient Re-evaluated:Patient Re-evaluated prior to induction Oxygen Delivery Method: Circle system utilized Preoxygenation: Pre-oxygenation with 100% oxygen Induction Type: IV induction, Cricoid Pressure applied and Rapid sequence Laryngoscope Size: Mac and 4 Grade View: Grade I Tube type: Oral Tube size: 7.5 mm Number of attempts: 1 Airway Equipment and Method: Video-laryngoscopy and Stylet Placement Confirmation: breath sounds checked- equal and bilateral,  CO2 detector and ETT inserted through vocal cords under direct vision Secured at: 22 cm Tube secured with: Tape Dental Injury: Teeth and Oropharynx as per pre-operative assessment  Difficulty Due To: Difficulty was anticipated

## 2019-10-18 NOTE — Anesthesia Preprocedure Evaluation (Addendum)
Anesthesia Evaluation  Patient identified by MRN, date of birth, ID band Patient awake    Reviewed: Allergy & Precautions, NPO status , Patient's Chart, lab work & pertinent test results, Unable to perform ROS - Chart review only  History of Anesthesia Complications Negative for: history of anesthetic complications  Airway Mallampati: II  TM Distance: >3 FB Neck ROM: Full    Dental  (+) Dental Advisory Given, Poor Dentition, Loose, Missing   Pulmonary sleep apnea and Continuous Positive Airway Pressure Ventilation ,  10/17/2019 SARS coronavirus NEG   breath sounds clear to auscultation       Cardiovascular hypertension, Pt. on medications (-) angina Rhythm:Regular Rate:Normal  '18 ECHO: EF 65-70%, no significant valvular abnormalities   Neuro/Psych Dementia H/o ICH with encephalopathy CVA    GI/Hepatic Neg liver ROS, GI bleed: vomiting and diarrhea   Endo/Other  negative endocrine ROS  Renal/GU Renal InsufficiencyRenal disease (creat 2.2, K+ 5.8)     Musculoskeletal  (+) Arthritis ,   Abdominal   Peds  Hematology  (+) Blood dyscrasia (Hb 6.3 ), anemia ,   Anesthesia Other Findings   Reproductive/Obstetrics                            Anesthesia Physical Anesthesia Plan  ASA: IV  Anesthesia Plan: General   Post-op Pain Management:    Induction: Intravenous and Rapid sequence  PONV Risk Score and Plan: 2 and Treatment may vary due to age or medical condition and Ondansetron  Airway Management Planned: Oral ETT  Additional Equipment:   Intra-op Plan:   Post-operative Plan: Extubation in OR  Informed Consent: I have reviewed the patients History and Physical, chart, labs and discussed the procedure including the risks, benefits and alternatives for the proposed anesthesia with the patient or authorized representative who has indicated his/her understanding and acceptance.    Patient has DNR.  Discussed DNR with power of attorney and Suspend DNR.   Dental advisory given  Plan Discussed with: CRNA and Surgeon  Anesthesia Plan Comments:        Anesthesia Quick Evaluation

## 2019-10-18 NOTE — H&P (Signed)
  Patient here in the endoscopy unit this morning for EGD to evaluate coffee-ground emesis and melena with associated anemia.  No change in H&P.  Physical:  No distress  Heart regular rhythm  Lungs clear  Abdomen soft and nontender  Impression: Coffee-ground emesis, melena, anemia  Plan EGD

## 2019-10-18 NOTE — Op Note (Signed)
[facility] Patient Name: Brian Wolf Procedure Date : 10/18/2019 MRN: [id number] Attending MD: Wonda Horner , MD Date of Birth: 01/19/1932 CSN: [id number] Age: 86 Admit Type: Inpatient Procedure:                Upper GI endoscopy Indications:              Coffee-ground emesis, Melena Providers:                Wonda Horner, MD, Benetta Spar RN, RN, Ladona Ridgel, Technician, Theodoro Grist, CRNA Referring MD:              Medicines:                General Anesthesia Complications:            No immediate complications. Estimated Blood Loss:     Estimated blood loss: none. Procedure:                Pre-Anesthesia Assessment:                           - Prior to the procedure, a History and Physical                            was performed, and patient medications and                            allergies were reviewed. The patient's tolerance of                            previous anesthesia was also reviewed. The risks                            and benefits of the procedure and the sedation                            options and risks were discussed with the patient.                            All questions were answered, and informed consent                            was obtained. Prior Anticoagulants: The patient has                            taken no previous anticoagulant or antiplatelet                            agents. ASA Grade Assessment: III - A patient with                            severe systemic disease. After reviewing the risks  and benefits, the patient was deemed in                            satisfactory condition to undergo the procedure.                           After obtaining informed consent, the endoscope was                            passed under direct vision. Throughout the                            procedure, the patient's blood pressure, pulse, and                             oxygen saturations were monitored continuously. The                            GIF-H190 ([id number]) Olympus gastroscope was                            introduced through the mouth, and advanced to the                            second part of duodenum. The upper GI endoscopy was                            accomplished without difficulty. The patient                            tolerated the procedure well. Scope In: Scope Out: Findings:      The examined esophagus was normal.      The entire examined stomach was normal.      One non-bleeding cratered duodenal ulcer with no stigmata of bleeding       was found in the first portion of the duodenum. Surrounded by       inflammatory mucosa (not bleeding) The lesion was 5 mm in largest       dimension. Impression:               - Normal esophagus.                           - Normal stomach.                           - Non-bleeding duodenal ulcer with no stigmata of                            bleeding.                           - No specimens collected. Recommendation:           - Clear liquid diet.                           -  Continue present medications. PPI. Observe for                            further bleeding. Procedure Code(s):        --- Professional ---                           [phone], Esophagogastroduodenoscopy, flexible,                            transoral; diagnostic, including collection of                            specimen(s) by brushing or washing, when performed                            (separate procedure) Diagnosis Code(s):        --- Professional ---                           K26.9, Duodenal ulcer, unspecified as acute or                            chronic, without hemorrhage or perforation                           K92.0, Hematemesis                           K92.1, Melena (includes Hematochezia) CPT copyright 2019 American Medical Association. All rights reserved. The codes documented in this report are preliminary  and upon coder review may  be revised to meet current compliance requirements. Wonda Horner, MD 10/18/2019 11:20:47 AM This report has been signed electronically. Number of Addenda: 0

## 2019-10-18 NOTE — Transfer of Care (Signed)
Immediate Anesthesia Transfer of Care Note  Patient: Brian Wolf  Procedure(s) Performed: ESOPHAGOGASTRODUODENOSCOPY (EGD) (N/A )  Patient Location: Endoscopy Unit  Anesthesia Type:General  Level of Consciousness: awake, alert  and sedated  Airway & Oxygen Therapy: Patient connected to face mask oxygen  Post-op Assessment: Post -op Vital signs reviewed and stable  Post vital signs: stable  Last Vitals:  Vitals Value Taken Time  BP 149/45 10/18/19 1124  Temp    Pulse 62 10/18/19 1125  Resp 27 10/18/19 1125  SpO2 100 % 10/18/19 1125  Vitals shown include unvalidated device data.  Last Pain:  Vitals:   10/18/19 1009  TempSrc: Axillary  PainSc: 0-No pain         Complications: No complications documented.

## 2019-10-18 NOTE — Progress Notes (Signed)
Pt arrived to 4E-03 from ED. CHG bath completed and new gown applied. Tele applied and CCMD notified. VS stable. Pt oriented to room, bed and call bell. Pt denies pain. Will continue to monitor.  Adella Hare, RN

## 2019-10-18 NOTE — Progress Notes (Signed)
Verbal consent received for ECG procedure and blood. X 2 nurses.  Placed in chart

## 2019-10-18 NOTE — Anesthesia Postprocedure Evaluation (Signed)
Anesthesia Post Note  Patient: Brian Wolf  Procedure(s) Performed: ESOPHAGOGASTRODUODENOSCOPY (EGD) (N/A )     Patient location during evaluation: Endoscopy Anesthesia Type: General Level of consciousness: awake and alert and patient cooperative Pain management: pain level controlled Vital Signs Assessment: post-procedure vital signs reviewed and stable Respiratory status: spontaneous breathing, nonlabored ventilation and respiratory function stable Cardiovascular status: blood pressure returned to baseline and stable Postop Assessment: no apparent nausea or vomiting Anesthetic complications: no   No complications documented.  Last Vitals:  Vitals:   10/18/19 1130 10/18/19 1140  BP: (!) 152/50 (!) 169/56  Pulse: 68 66  Resp: (!) 24 (!) 23  Temp:    SpO2: 99% 98%    Last Pain:  Vitals:   10/18/19 1140  TempSrc:   PainSc: 0-No pain                 Jairen Goldfarb,E. Koraline Phillipson

## 2019-10-18 NOTE — Progress Notes (Signed)
[facility] Progress Note  Patient: Brian Wolf    ZOX:[id number]  DOA: 10/17/2019     Date of Service: the patient was seen and examined on 10/18/2019  Brief hospital course: Past medical history of CVA, HTN, stage IIIb CKD, OSA on CPAP.  Presents with complaints of fatigue and tiredness and found to have hematochezia.  GI consulted and underwent EGD on 10/18/2019. Currently plan is monitor H&H and supportive care.  Assessment and Plan: 1.  Acute upper GI bleed. Acute blood loss anemia. Syncope Presents with a syncopal event on commode. Hemoglobin was significantly low on admission. SP 1 PRBC transfusion. Hemoglobin again on 10/18/2019 low.  2 PRBC transfusion ordered. Currently on Protonix drip. Underwent EGD which showed nonbleeding ulcer without any active bleeding. Follow-up on GI recommendation. Monitor H&H. Transfuse for hemoglobin less than 7. Baseline hemoglobin 12. Hold aspirin for now.  2.  Acute kidney injury on chronic kidney disease stage IIIb. From acute blood loss. Patient was on IV fluids. Currently we will hold. Monitor.  3.  Hyperkalemia Secondary to AKI. Hold. Patient was given Lokelma.  No further treatment.  4.  Essential hypertension Blood pressure mildly elevated. Currently monitoring.  5.  History of CVA as well as CAD Currently aspirin on hold. Continue Zocor.  Continue nitrate.  Diet: Clear liquid diet.  Advancement by GI. DVT Prophylaxis:   SCDs Start: 10/17/19 1047    Advance goals of care discussion: DNR  Family Communication: no family was present at bedside, at the time of interview.   Disposition:  Status is: Inpatient  Remains inpatient appropriate because:Inpatient level of care appropriate due to severity of illness   Dispo: The patient is from: Home              Anticipated d/c is to: Home              Anticipated d/c date is: 2 days              Patient currently is not medically stable to d/c.  Subjective:  No nausea no vomiting.  No fever no chills.  Mild acid reflux.  No BM so far this morning.  Physical Exam:  General: Appear in mild distress, no Rash; Oral Mucosa Clear, moist. no Abnormal Neck Mass Or lumps, Conjunctiva normal  Cardiovascular: S1 and S2 Present, no Murmur, Respiratory: good respiratory effort, Bilateral Air entry present and CTA, no Crackles, no wheezes Abdomen: Bowel Sound present, Soft and no tenderness Extremities: no Pedal edema Neurology: alert and oriented to time, place, and person affect appropriate. no new focal deficit Gait not checked due to patient safety concerns  Vitals:   10/18/19 1447 10/18/19 1506 10/18/19 1642 10/18/19 1703  BP: (!) 170/65 (!) 173/62 (!) 159/64 (!) 149/79  Pulse: 69  72 74  Resp: 20  (!) 21 (!) 22  Temp: 98.7 F (37.1 C) 98.9 F (37.2 C) 98.5 F (36.9 C) 98.1 F (36.7 C)  TempSrc: Oral Oral Oral Axillary  SpO2:   97%   Weight:      Height:        Intake/Output Summary (Last 24 hours) at 10/18/2019 1756 Last data filed at 10/18/2019 1703 Gross per 24 hour  Intake 2486.45 ml  Output 400 ml  Net 2086.45 ml   Filed Weights   10/17/19 0744 10/17/19 2009  Weight: 66.7 kg 67.9 kg    Data Reviewed: I have personally reviewed and interpreted daily labs, tele strips, imagings as discussed above. I  reviewed all nursing notes, pharmacy notes, vitals, pertinent old records I have discussed plan of care as described above with RN and patient/family.  CBC: Recent Labs  Lab 10/17/19 0729 10/17/19 0908 10/17/19 1550 10/18/19 0400 10/18/19 1033  WBC 10.7*  --  13.6* 10.7*  --   NEUTROABS 7.3  --   --   --   --   HGB 6.9* 5.8* 6.6* 6.3* 7.1*  HCT 21.7* 17.0* 20.7* 19.0* 21.0*  MCV 96.4  --  94.5 92.2  --   PLT 231  --  171 179  --    Basic Metabolic Panel: Recent Labs  Lab 10/17/19 0903 10/17/19 0908 10/17/19 1550 10/18/19 0400 10/18/19 1033  NA 135 136 137 138 140  K 6.5* 6.4* 5.9* 5.7* 5.8*  CL 113* 112* 113*  115* 113*  CO2 15*  --  15* 18*  --   GLUCOSE 122* 116* 107* 96 93  BUN 120* 128* 120* 108* 108*  CREATININE 2.12* 2.20* 2.09* 2.20* 2.20*  CALCIUM 8.1*  --  7.9* 8.3*  --     Studies: No results found.  Scheduled Meds: . sodium chloride   Intravenous Once  . hydrALAZINE  50 mg Oral TID  . isosorbide dinitrate  30 mg Oral TID  . latanoprost  1 drop Both Eyes QHS  . [START ON 10/20/2019] pantoprazole  40 mg Intravenous Q12H  . simvastatin  40 mg Oral Daily  . sodium chloride flush  3 mL Intravenous Q12H  . tamsulosin  0.4 mg Oral Daily   Continuous Infusions: . pantoprozole (PROTONIX) infusion 8 mg/hr (10/17/19 2326)   PRN Meds: acetaminophen **OR** acetaminophen, ondansetron **OR** ondansetron (ZOFRAN) IV  Time spent: 35 minutes  Author: Berle Mull, MD [facility] 10/18/2019 5:56 PM  To reach On-call, see care teams to locate the attending and reach out via [email or web address]. Between 7PM-7AM, please contact night-coverage If you still have difficulty reaching the attending provider, please page the [facility] (Director on Call) for [facility] on amion for assistance.

## 2019-10-19 ENCOUNTER — Encounter (HOSPITAL_COMMUNITY): Payer: Self-pay | Admitting: Gastroenterology

## 2019-10-19 ENCOUNTER — Inpatient Hospital Stay (HOSPITAL_COMMUNITY): Payer: Medicare Other

## 2019-10-19 LAB — TYPE AND SCREEN
ABO/RH(D): O POS
Antibody Screen: NEGATIVE
Unit division: 0
Unit division: 0
Unit division: 0

## 2019-10-19 LAB — CBC WITH DIFFERENTIAL/PLATELET
Abs Immature Granulocytes: 0.1 10*3/uL — ABNORMAL HIGH (ref 0.00–0.07)
Basophils Absolute: 0 10*3/uL (ref 0.0–0.1)
Basophils Relative: 0 %
Eosinophils Absolute: 0.2 10*3/uL (ref 0.0–0.5)
Eosinophils Relative: 2 %
HCT: 28 % — ABNORMAL LOW (ref 39.0–52.0)
Hemoglobin: 9.3 g/dL — ABNORMAL LOW (ref 13.0–17.0)
Immature Granulocytes: 1 %
Lymphocytes Relative: 19 %
Lymphs Abs: 1.8 10*3/uL (ref 0.7–4.0)
MCH: 30.2 pg (ref 26.0–34.0)
MCHC: 33.2 g/dL (ref 30.0–36.0)
MCV: 90.9 fL (ref 80.0–100.0)
Monocytes Absolute: 0.9 10*3/uL (ref 0.1–1.0)
Monocytes Relative: 10 %
Neutro Abs: 6.5 10*3/uL (ref 1.7–7.7)
Neutrophils Relative %: 68 %
Platelets: 178 10*3/uL (ref 150–400)
RBC: 3.08 MIL/uL — ABNORMAL LOW (ref 4.22–5.81)
RDW: 15.9 % — ABNORMAL HIGH (ref 11.5–15.5)
WBC: 9.6 10*3/uL (ref 4.0–10.5)
nRBC: 0 % (ref 0.0–0.2)

## 2019-10-19 LAB — BPAM RBC
Blood Product Expiration Date: 202109182359
Blood Product Expiration Date: 202110122359
Blood Product Expiration Date: 202110132359
ISSUE DATE / TIME: 202109151145
ISSUE DATE / TIME: 202109160945
ISSUE DATE / TIME: 202109161442
Unit Type and Rh: 5100
Unit Type and Rh: 5100
Unit Type and Rh: 9500

## 2019-10-19 LAB — COMPREHENSIVE METABOLIC PANEL
ALT: 25 U/L (ref 0–44)
AST: 35 U/L (ref 15–41)
Albumin: 2.5 g/dL — ABNORMAL LOW (ref 3.5–5.0)
Alkaline Phosphatase: 61 U/L (ref 38–126)
Anion gap: 7 (ref 5–15)
BUN: 77 mg/dL — ABNORMAL HIGH (ref 8–23)
CO2: 19 mmol/L — ABNORMAL LOW (ref 22–32)
Calcium: 8.4 mg/dL — ABNORMAL LOW (ref 8.9–10.3)
Chloride: 114 mmol/L — ABNORMAL HIGH (ref 98–111)
Creatinine, Ser: 2.02 mg/dL — ABNORMAL HIGH (ref 0.61–1.24)
GFR calc Af Amer: 34 mL/min — ABNORMAL LOW (ref >60)
GFR calc non Af Amer: 29 mL/min — ABNORMAL LOW (ref >60)
Glucose, Bld: 98 mg/dL (ref 70–99)
Potassium: 5.4 mmol/L — ABNORMAL HIGH (ref 3.5–5.1)
Sodium: 140 mmol/L (ref 135–145)
Total Bilirubin: 0.8 mg/dL (ref 0.3–1.2)
Total Protein: 5 g/dL — ABNORMAL LOW (ref 6.5–8.1)

## 2019-10-19 MED ORDER — LACTATED RINGERS IV SOLN: INTRAVENOUS | Status: AC

## 2019-10-19 MED ORDER — PANTOPRAZOLE SODIUM 40 MG PO TBEC
40.0000 mg | DELAYED_RELEASE_TABLET | Freq: Two times a day (BID) | ORAL | Status: DC
  Administered 2019-10-19 – 2019-10-22 (×7): 40 mg via ORAL
  Filled 2019-10-19 (×8): qty 1

## 2019-10-19 MED ORDER — SODIUM ZIRCONIUM CYCLOSILICATE 5 G PO PACK
5.0000 g | PACK | Freq: Once | ORAL | Status: AC
  Administered 2019-10-19: 5 g via ORAL
  Filled 2019-10-19: qty 1

## 2019-10-19 NOTE — Progress Notes (Signed)
[facility] PROGRESS NOTE    Brian Wolf  NGE:[id number] DOB: Mar 13, 1931 DOA: 10/17/2019 PCP: [name], DO      Brief Narrative:  Brian Wolf is a 86 y.o. M with hx CVA mild left weakness, and HTN who presented after passing out.  Patient was feeling weak and tired, woke up early in the morning had several BM, family passed out, with blood in the stool and emesis.  EMS were called.  In the ER, gross melena on exam, hemoglobin 5.8, started on Protonix and blood transfusion.      Assessment & Plan:  Acute upper GI bleed duodenal ulcer Acute blood loss anemia Hgb 5.8 on admission, transfused 3 units, appropriate bump  EGD done yesterday, showed duodenal ulcer no stigmata of recent bleeding.  Hgb now stable at 9. -Continue PPI BID -Follow up with GI in 2-4 weeks -Advance diet as tolerated   Acute metabolic encephalopathy This seems out of proportion to the insult (GI bleed and anemia).  Is hyperkalemia and renal insufficiency contributing?  Was there an intracranial event that was missed? -Check TSH -Obtain CT head  -Hydrate and monitor  -PT/OT -Delirium precautions  CKD IV Cr back in May 1.87, now 2.1.  This seems to be his basleine.  Doubt AKI -Hydrate and monitor Cr  Hyperkalemia -Repeat Lokelma -Stop spironolactone  Cerebrovascular disease, secondary prevention Hypertension BP here seems high normal -Continue Isordil, hydralazine  Sick sinus syndrome Has recently been seeing his EP doctor about bradycardia and labile blood pressure -Check TSH  BPH -Continue FLomax         Disposition: Status is: Inpatient  Remains inpatient appropriate because:Altered mental status   Dispo: The patient is from: Home              Anticipated d/c is to: SNF              Anticipated d/c date is: 1 day              Patient currently is not medically stable to d/c.     The patient was admitted with an acute GI bleed, acute blood loss  anemia.  The bleeding has resolved, but he is currently extremely weak, not been able to stand or care for himself safely at home.  Will seek safe disposition SNF, try to hydrate him, and he has altered mental status from baseline.  If his renal function is stable after hydration, no new manifestations of infection have presented themselves, and his electrolytes remain normal, will likely transition to rehab when bed available.         MDM: The below labs and imaging reports were reviewed and summarized above.  Medication management as above.      DVT prophylaxis: SCDs Start: 10/17/19 1047  Code Status: DNR Family Communication: [name], wife as well as Son Remo Lipps who is physician in [place] and [place]    Consultants:   GI  Procedures:   EGD            Subjective: Patient is very weak.  He has no focal weakness, numbness, slurred speech.  No chest pain.  No further bowel movements.  Objective: Vitals:   10/19/19 0432 10/19/19 0813 10/19/19 1141 10/19/19 1512  BP: (!) 145/59 (!) 175/70 (!) 152/69 (!) 156/68  Pulse: 68 68 71 78  Resp: 16 17 15 16   Temp: 97.8 F (36.6 C) 98.4 F (36.9 C) 98.5 F (36.9 C) 97.9 F (36.6 C)  TempSrc:  Oral Oral Axillary Axillary  SpO2: 96% 98% 97% 100%  Weight:      Height:        Intake/Output Summary (Last 24 hours) at 10/19/2019 1839 Last data filed at 10/19/2019 1542 Gross per 24 hour  Intake 0 ml  Output 300 ml  Net -300 ml   Filed Weights   10/17/19 0744 10/17/19 2009  Weight: 66.7 kg 67.9 kg    Examination: General appearance:  adult male, alert and in no acute distress.   HEENT: Anicteric, conjunctiva pink, lids and lashes normal. No nasal deformity, discharge, epistaxis.  Lips moist.   Skin: Warm and dry.  No jaundice.  No suspicious rashes or lesions. Cardiac: RRR, nl S1-S2, no murmurs appreciated.  Capillary refill is brisk.  JVP not visible.  No LE edema.  Radial  pulses 2+ and symmetric. Respiratory:  Normal respiratory rate and rhythm.  CTAB without rales or wheezes. Abdomen: Abdomen soft.  No TTP or guarding. No ascites, distension, hepatosplenomegaly.   MSK: No deformities or effusions. Neuro: Awake but very sleepy.  EOMI, moves all extremities with severe generalized weakness, unable to keep arms above gravity, slightly weaker on the left. Speech fluent.   Cranial nerves III through XII intact.  Speech slow but fluent. Psych: Sensorium intact and responding to questions but very slowly, and very weak.  Attention diminished, affect flat, judgment insight appear impaired     Data Reviewed: I have personally reviewed following labs and imaging studies:  CBC: Recent Labs  Lab 10/17/19 0729 10/17/19 0908 10/17/19 1550 10/18/19 0400 10/18/19 1033 10/18/19 1845 10/19/19 0207  WBC 10.7*  --  13.6* 10.7*  --  10.4 9.6  NEUTROABS 7.3  --   --   --   --   --  6.5  HGB 6.9*   < > 6.6* 6.3* 7.1* 8.9* 9.3*  HCT 21.7*   < > 20.7* 19.0* 21.0* 26.8* 28.0*  MCV 96.4  --  94.5 92.2  --  90.2 90.9  PLT 231  --  171 179  --  177 178   < > = values in this interval not displayed.   Basic Metabolic Panel: Recent Labs  Lab 10/17/19 0903 10/17/19 0903 10/17/19 0908 10/17/19 1550 10/18/19 0400 10/18/19 1033 10/19/19 0207  NA 135   < > 136 137 138 140 140  K 6.5*   < > 6.4* 5.9* 5.7* 5.8* 5.4*  CL 113*   < > 112* 113* 115* 113* 114*  CO2 15*  --   --  15* 18*  --  19*  GLUCOSE 122*   < > 116* 107* 96 93 98  BUN 120*   < > 128* 120* 108* 108* 77*  CREATININE 2.12*   < > 2.20* 2.09* 2.20* 2.20* 2.02*  CALCIUM 8.1*  --   --  7.9* 8.3*  --  8.4*   < > = values in this interval not displayed.   GFR: Estimated Creatinine Clearance: 22.8 mL/min (A) (by C-G formula based on SCr of 2.02 mg/dL (H)). Liver Function Tests: Recent Labs  Lab 10/17/19 0903 10/19/19 0207  AST 31 35  ALT 28 25  ALKPHOS 64 61  BILITOT 0.4 0.8  PROT 5.0* 5.0*  ALBUMIN 2.5* 2.5*   No results for input(s):  LIPASE, AMYLASE in the last 168 hours. No results for input(s): AMMONIA in the last 168 hours. Coagulation Profile: No results for input(s): INR, PROTIME in the last 168 hours. Cardiac Enzymes: No results for  input(s): CKTOTAL, CKMB, CKMBINDEX, TROPONINI in the last 168 hours. BNP (last 3 results) No results for input(s): PROBNP in the last 8760 hours. HbA1C: No results for input(s): HGBA1C in the last 72 hours. CBG: No results for input(s): GLUCAP in the last 168 hours. Lipid Profile: No results for input(s): CHOL, HDL, LDLCALC, TRIG, CHOLHDL, LDLDIRECT in the last 72 hours. Thyroid Function Tests: No results for input(s): TSH, T4TOTAL, FREET4, T3FREE, THYROIDAB in the last 72 hours. Anemia Panel: No results for input(s): VITAMINB12, FOLATE, FERRITIN, TIBC, IRON, RETICCTPCT in the last 72 hours. Urine analysis:    Component Value Date/Time   COLORURINE YELLOW 03/09/2016 0158   APPEARANCEUR HAZY (A) 03/09/2016 0158   LABSPEC 1.015 03/09/2016 0158   PHURINE 5.0 03/09/2016 0158   GLUCOSEU NEGATIVE 03/09/2016 0158   HGBUR NEGATIVE 03/09/2016 0158   BILIRUBINUR NEGATIVE 03/09/2016 0158   KETONESUR NEGATIVE 03/09/2016 0158   PROTEINUR 30 (A) 03/09/2016 0158   UROBILINOGEN 0.2 12/16/2013 0929   NITRITE NEGATIVE 03/09/2016 0158   LEUKOCYTESUR MODERATE (A) 03/09/2016 0158   Sepsis Labs: @LABRCNTIP (procalcitonin:4,lacticacidven:4)  ) Recent Results (from the past 240 hour(s))  SARS Coronavirus 2 by RT PCR (hospital order, performed in [place] hospital lab) Nasopharyngeal Nasopharyngeal Swab     Status: None   Collection Time: 10/17/19  7:29 AM   Specimen: Nasopharyngeal Swab  Result Value Ref Range Status   SARS Coronavirus 2 NEGATIVE NEGATIVE Final    Comment: (NOTE) SARS-CoV-2 target nucleic acids are NOT DETECTED.  The SARS-CoV-2 RNA is generally detectable in upper and lower respiratory specimens during the acute phase of infection. The lowest concentration of  SARS-CoV-2 viral copies this assay can detect is 250 copies / mL. A negative result does not preclude SARS-CoV-2 infection and should not be used as the sole basis for treatment or other patient management decisions.  A negative result may occur with improper specimen collection / handling, submission of specimen other than nasopharyngeal swab, presence of viral mutation(s) within the areas targeted by this assay, and inadequate number of viral copies (<250 copies / mL). A negative result must be combined with clinical observations, patient history, and epidemiological information.  Fact Sheet for Patients:   StrictlyIdeas.no  Fact Sheet for Healthcare Providers: BankingDealers.co.za  This test is not yet approved or  cleared by the [place] FDA and has been authorized for detection and/or diagnosis of SARS-CoV-2 by FDA under an Emergency Use Authorization (EUA).  This EUA will remain in effect (meaning this test can be used) for the duration of the COVID-19 declaration under Section 564(b)(1) of the Act, 21 U.S.C. section 360bbb-3(b)(1), unless the authorization is terminated or revoked sooner.  Performed at [facility], [place] [address], [place], [place] [address]          Radiology Studies: No results found.      Scheduled Meds: . sodium chloride   Intravenous Once  . hydrALAZINE  50 mg Oral TID  . isosorbide dinitrate  30 mg Oral TID  . latanoprost  1 drop Both Eyes QHS  . pantoprazole  40 mg Oral BID AC  . simvastatin  40 mg Oral Daily  . sodium chloride flush  3 mL Intravenous Q12H  . tamsulosin  0.4 mg Oral Daily   Continuous Infusions: . lactated ringers 75 mL/hr at 10/19/19 1542     LOS: 2 days    Time spent: 35 minutes    Edwin Dada, MD [facility] 10/19/2019, 6:39 PM  Please page though [place] or Epic secure chat:  For [employer], Adult nurse

## 2019-10-19 NOTE — NC FL2 (Addendum)
[place] LEVEL OF CARE SCREENING TOOL     IDENTIFICATION  Patient Name: Brian Wolf Birthdate: 1931-04-24 Sex: male Admission Date (Current Location): 10/17/2019  [facility] and [place] Number:     [employer] forces logistics/support/administrative officer and Address:  [facility] [place] [address], [place], [place] [address]      Provider Number:  [place]=[id number]  Attending Physician Name and Address:  Edwin Dada, *  Relative Name and Phone Number:  [name] (spouse) [phone]    Current Level of Care: Hospital Recommended Level of Care: [place] Prior Approval Number:    Date Approved/Denied:   PASRR Number: [id number]  Discharge Plan: SNF    Current Diagnoses: Patient Active Problem List   Diagnosis Date Noted   Acute upper GI bleed 10/17/2019   Hyperkalemia 10/17/2019   Acute kidney injury superimposed on CKD ([place]) 10/17/2019   DNR (do not resuscitate) 10/17/2019   Fall    History of stroke 03/09/2016   Sleep apnea 03/09/2016   SAH (subarachnoid hemorrhage) ([place]) 03/09/2016   Subarachnoid bleed ([place]) 03/08/2016   Essential hypertension    Sleep difficulties    Homonymous hemianopsia 01/14/2014   Homonymous hemianopsia due to recent cerebral infarction 12/24/2013   Left-sided neglect 12/24/2013   Gout flare 12/20/2013   Benign essential HTN 12/20/2013   Acute hemorrhagic infarction of brain ([place]) 12/20/2013   Hemorrhagic stroke [facility])    Other secondary acute gout of left foot    Hypokalemia 12/19/2013   Altered mental status    Encounter for central line placement    Encephalopathy acute    ICH (intracerebral hemorrhage) ([place]) 12/14/2013   Fever     Orientation RESPIRATION BLADDER Height & Weight     Situation, Place, Self  Normal Incontinent, External catheter Weight: 149 lb 11.1 oz (67.9 kg) Height:  5\' 5"  (165.1 cm)  BEHAVIORAL SYMPTOMS/MOOD NEUROLOGICAL BOWEL NUTRITION STATUS       Continent Diet (See DC summary)  AMBULATORY STATUS COMMUNICATION OF NEEDS Skin   Extensive Assist Verbally Normal                       Personal Care Assistance Level of Assistance  Bathing, Dressing, Feeding Bathing Assistance: Maximum assistance   Dressing Assistance: Maximum assistance    Feeding Assistance: Limited   Functional Limitations Info             SPECIAL CARE FACTORS FREQUENCY  PT (By licensed PT), OT (By licensed OT)     PT Frequency: 5x per week OT Frequency: 5x per week            Contractures Contractures Info: Not present    Additional Factors Info  Allergies, Code Status Code Status Info: DNR Allergies Info: NKA           Current Medications (10/19/2019):  This is the current hospital active medication list Current Facility-Administered Medications  Medication Dose Route Frequency Provider Last Rate Last Admin   0.9 %  sodium chloride infusion (Manually program via Guardrails IV Fluids)   Intravenous Once Wonda Horner, MD       acetaminophen (TYLENOL) tablet 650 mg  650 mg Oral Q6H PRN Wonda Horner, MD       Or   acetaminophen (TYLENOL) suppository 650 mg  650 mg Rectal Q6H PRN Wonda Horner, MD       hydrALAZINE (APRESOLINE) tablet 50 mg  50 mg Oral TID Anson Fret F,  MD   50 mg at 10/19/19 1512   isosorbide dinitrate (ISORDIL) tablet 30 mg  30 mg Oral TID Wonda Horner, MD   30 mg at 10/19/19 1512   lactated ringers infusion   Intravenous Continuous [place], Suann Larry, MD 75 mL/hr at 10/19/19 1542 New Bag at 10/19/19 1542   latanoprost (XALATAN) 0.005 % ophthalmic solution 1 drop  1 drop Both Eyes QHS Anson Fret F, MD   1 drop at 10/18/19 2115   ondansetron (ZOFRAN) tablet 4 mg  4 mg Oral Q6H PRN Wonda Horner, MD       Or   ondansetron [facility]) injection 4 mg  4 mg Intravenous Q6H PRN Anson Fret F, MD   4 mg at 10/18/19 1100   pantoprazole (PROTONIX) EC tablet 40 mg  40 mg Oral BID AC [place]ford, Suann Larry,  MD   40 mg at 10/19/19 0833   simvastatin (ZOCOR) tablet 40 mg  40 mg Oral Daily Anson Fret F, MD   40 mg at 10/19/19 0814   sodium chloride flush (NS) 0.9 % injection 3 mL  3 mL Intravenous Q12H Anson Fret F, MD   3 mL at 10/18/19 2107   sodium zirconium cyclosilicate (LOKELMA) packet 5 g  5 g Oral Once Edwin Dada, MD       tamsulosin [facility]) capsule 0.4 mg  0.4 mg Oral Daily Wonda Horner, MD   0.4 mg at 10/19/19 2035     Discharge Medications: Please see discharge summary for a list of discharge medications.  Relevant Imaging Results:  Relevant Lab Results:   Additional Information SSN: [id number]  Glennon Hamilton, Student-Social Work

## 2019-10-19 NOTE — Progress Notes (Signed)
Occupational Therapy Evaluation Patient Details Name: Brian Wolf MRN: [id number] DOB: 20-Jun-1931 Today's Date: 10/19/2019    History of Present Illness Pt is 86 yo male with PMH of CVA, HTN, bradycardia, possible dementia, CKD 3, and OSA.  He presented with GI bleeding and low Hgb.  GI bleed found to be from duodenal ulcer and duodenitis.   Pt has received 3 units PRBC.   Clinical Impression   PTA patient was living with his wife in a private residence and was independent to [place] I with BADLs/IADLs with occasional use of RW. Patient was not driving. Patient currently presents below baseline level of function requiring +2 assist and use of RW for functional transfers and multimodal cues with hand over hand assist to perform simple/familiar BADL tasks including face washing 2/2 decreased cognition. Patient also limited by decreased orientation (A&Ox1 to name only), decreased attention, and decreased awareness. Speech therapy eval/treat appreciated. Patient would benefit from continued acute OT services in prep for safe d/c to next level of care with recommendation for SNF rehab. Wife in agreement.     Follow Up Recommendations  SNF    Equipment Recommendations  Other (comment) (Defer to next level of care)    Recommendations for Other Services       Precautions / Restrictions Precautions Precautions: Fall Restrictions Weight Bearing Restrictions: No      Mobility Bed Mobility Overal bed mobility: Needs Assistance Bed Mobility: Sit to Supine     Supine to sit: Mod assist Sit to supine: Mod assist;+2 for physical assistance   General bed mobility comments: Multimodal cues and Mod A for sequencing  Transfers Overall transfer level: Needs assistance Equipment used: Rolling walker (2 wheeled) Transfers: Sit to/from Stand Sit to Stand: Mod assist;+2 physical assistance         General transfer comment: Sit to stand from EOB x2 trials with multimodal cues for sequencing      Balance Overall balance assessment: Needs assistance Sitting-balance support: Feet supported;No upper extremity supported Sitting balance-[name]: Good Sitting balance - Comments: able to complete MMT at EOB without UE support     Standing balance-[name]: Poor Standing balance comment: Requiring RW and min-mod A                           ADL either performed or assessed with clinical judgement   ADL Overall ADL's : Needs assistance/impaired     Grooming: Maximal assistance;Sitting Grooming Details (indicate cue type and reason): Max A required to wash face with hand over hand assist         Upper Body Dressing : Moderate assistance;Sitting Upper Body Dressing Details (indicate cue type and reason): To don anterior hospital gown seated EOB with multimodal cues Lower Body Dressing: Maximal assistance;Sit to/from stand;Sitting/lateral leans Lower Body Dressing Details (indicate cue type and reason): To don footwear seated EOB. Difficulty following multimodal cues to complete task.              Functional mobility during ADLs: Moderate assistance;+2 for physical assistance General ADL Comments: Patient with difficulty correctly idenifying ADL items presented in central vision. Patient eventually able to grab washcloth from OT and wash face with max A and hand over hand.      Vision Baseline Vision/History: Wears glasses Patient Visual Report: Other (comment) (Unable to assess 2/2 cognition) Vision Assessment?:  (Unable to assess 2/2 cognition)     Perception     Praxis  Pertinent Vitals/Pain Pain Assessment: Faces Faces Pain Scale: Hurts little more Pain Location: generalized - grimaced with transfers and UE movement Pain Descriptors / Indicators: Grimacing Pain Intervention(s): Monitored during session;Repositioned     Hand Dominance Right   Extremity/Trunk Assessment Upper Extremity Assessment Upper Extremity Assessment: Generalized  weakness   Lower Extremity Assessment Lower Extremity Assessment: Defer to PT evaluation RLE Deficits / Details: ROM WFL; MMT 4/5; required repeated cues LLE Deficits / Details: ROM WFL; MMT 4/5; required repeated cues   Cervical / Trunk Assessment Cervical / Trunk Assessment: Normal   Communication Communication Communication: No difficulties   Cognition Arousal/Alertness: Awake/alert Behavior During Therapy: WFL for tasks assessed/performed Overall Cognitive Status: Impaired/Different from baseline Area of Impairment: Orientation;Problem solving;Attention;Following commands;Awareness;Safety/judgement                 Orientation Level: Disoriented to;Place;Time;Situation Current Attention Level: Sustained   Following Commands: Follows one step commands with increased time;Follows one step commands inconsistently Safety/Judgement: Decreased awareness of safety;Decreased awareness of deficits Awareness: Intellectual Problem Solving: Slow processing;Decreased initiation;Difficulty sequencing;Requires verbal cues;Requires tactile cues General Comments: Pt's wife reports mild memory issues at baseline, but this level of confusion is very abnormal.  Pt with difficulty sequencing even for basic task (moving to sit, take steps, wash face).  He was only oriented to self.  Not able to follow multistep commands and required multimodal cues for single step commands.   General Comments  BP was stable at EOB    Exercises     Shoulder Instructions      Home Living Family/patient expects to be discharged to:: Private residence Living Arrangements: Spouse/significant other Available Help at Discharge: Family;Available 24 hours/day Type of Home: House Home Access: Ramped entrance     Home Layout: One level;Laundry or work area in basement     [employer] Shower/Tub: [facility];Tub/shower unit   Bathroom Toilet: Standard Bathroom Accessibility: Yes   Home Equipment: Shower  seat;Grab bars - tub/shower;Bedside commode          Prior Functioning/Environment Level of Independence: Independent        Comments: Pt was independent with ADLs, IADLs, and community ambulation with occasional use of RW.  No falls at home.        OT Problem List: Decreased strength;Decreased range of motion;Decreased activity tolerance;Impaired balance (sitting and/or standing);Decreased coordination;Decreased cognition;Decreased safety awareness;Decreased knowledge of use of DME or AE      OT Treatment/Interventions: Self-care/ADL training;Therapeutic exercise;Energy conservation;DME and/or AE instruction;Therapeutic activities;Cognitive remediation/compensation;Patient/family education;Balance training    OT Goals(Current goals can be found in the care plan section) Acute Rehab OT Goals Patient Stated Goal: wife agreeable to SNF - prefers [facility] OT Goal Formulation: With patient/family Time For Goal Achievement: 11/02/19 Potential to Achieve Goals: Fair ADL Goals Pt Will Perform Eating: with supervision Pt Will Perform Grooming: with supervision Pt Will Perform Upper Body Dressing: with supervision Pt Will Perform Lower Body Dressing: with min assist Pt Will Transfer to Toilet: with min assist Pt Will Perform Toileting - Clothing Manipulation and hygiene: with min assist Pt/caregiver will Perform Home Exercise Program: Increased strength;Increased ROM;Both right and left upper extremity;With written HEP provided  OT Frequency: Min 2X/week   Barriers to D/C:            Co-evaluation PT/OT/SLP Co-Evaluation/Treatment: Yes Reason for Co-Treatment: Complexity of the patient's impairments (multi-system involvement);For patient/therapist safety          AM-PAC OT "6 Clicks" Daily Activity     Outcome Measure  Help from another person eating meals?: A Lot Help from another person taking care of personal grooming?: A Lot Help from another person  toileting, which includes using toliet, bedpan, or urinal?: Total Help from another person bathing (including washing, rinsing, drying)?: Total Help from another person to put on and taking off regular upper body clothing?: A Lot Help from another person to put on and taking off regular lower body clothing?: A Lot 6 Click Score: 10   End of Session Equipment Utilized During Treatment: Gait belt;Rolling walker Nurse Communication: Mobility status  Activity Tolerance: Patient tolerated treatment well Patient left: in bed;with call bell/phone within reach;with bed alarm set;with family/visitor present  OT Visit Diagnosis: Unsteadiness on feet (R26.81);Muscle weakness (generalized) (M62.81);History of falling (Z91.81)                Time: 6378-5885 OT Time Calculation (min): 16 min Charges:  OT General Charges $OT Visit: 1 Visit OT Evaluation $OT Eval Moderate Complexity: 1 Mod  Marina Boerner H. OTR/L Supplemental OT, Department of rehab services [phone]  Jatavion Peaster R H. 10/19/2019, 3:29 PM

## 2019-10-19 NOTE — Evaluation (Signed)
Physical Therapy Evaluation Patient Details Name: Brian Wolf MRN: [id number] DOB: 08/04/1931 Today's Date: 10/19/2019   History of Present Illness  Pt is 86 yo male with PMH of CVA, HTN, bradycardia, possible dementia, CKD 3, and OSA.  He presented with GI bleeding and low Hgb.  GI bleed found to be from duodenal ulcer and duodenitis.   Pt has received 3 units PRBC.  Clinical Impression   Pt admitted with above diagnosis. Pt was admitted with GI bleed and has received 3 units PRBC with Hgb now stable.  During PT evaluation, pt with cognitive deficits including difficulty sequencing and limited ability to follow commands.  He required mod-mod A of 2 for all transfers and limited gait.  Required frequent multimodal cues for basic task.  Pt lives with wife and is normally independent.  Wife reports cognitive deficits are new.  Pt currently with functional limitations due to the deficits listed below (see PT Problem List). Pt will benefit from skilled PT to increase their independence and safety with mobility to allow discharge to the venue listed below.   Notified MD and RN of findings.       Follow Up Recommendations SNF    Equipment Recommendations  Rolling walker with 5" wheels;3in1 (PT) (to be further assessed next venue)    Recommendations for Other Services OT consult     Precautions / Restrictions Precautions Precautions: Fall      Mobility  Bed Mobility Overal bed mobility: Needs Assistance Bed Mobility: Supine to Sit;Sit to Supine     Supine to sit: Mod assist Sit to supine: Mod assist;+2 for physical assistance   General bed mobility comments: Required multimodal cues for sequencing with transfers.  Required assist to slide legs to EOB and then to lift trunk.  Transfers Overall transfer level: Needs assistance Equipment used: Rolling walker (2 wheeled) Transfers: Sit to/from Stand Sit to Stand: Mod assist;+2 physical assistance         General transfer  comment: Attempted sit to stand x 3 with bed elevated and assist of 1 with max A but unable.  Then attempted with assist of 2 and pt was able to stand.  Required faciliation at pelvis and shoulders for posture.  Ambulation/Gait Ambulation/Gait assistance: Mod assist;+2 physical assistance Gait Distance (Feet): 2 Feet Assistive device: Rolling walker (2 wheeled) Gait Pattern/deviations: Step-to pattern;Decreased stride length;Shuffle     General Gait Details: side steps up EOB, required assist to weight shift, commands for sequencing, and tapping each leg to iniitate movement  Stairs            Wheelchair Mobility    Modified Rankin (Stroke Patients Only)       Balance Overall balance assessment: Needs assistance Sitting-balance support: Feet supported;No upper extremity supported Sitting balance-[name]: Good Sitting balance - Comments: able to complete MMT at EOB without UE support     Standing balance-[name]: Poor Standing balance comment: Requiring RW and min-mod A                             Pertinent Vitals/Pain Pain Assessment: Faces Faces Pain Scale: Hurts little more Pain Location: generalized - grimaced with transfers and UE movement Pain Descriptors / Indicators: Grimacing Pain Intervention(s): Limited activity within patient's tolerance;Monitored during session    Home Living Family/patient expects to be discharged to:: Private residence Living Arrangements: Spouse/significant other Available Help at Discharge: Family;Available 24 hours/day Type of Home: House Home Access:  Ramped entrance     Home Layout: One level;Laundry or work area in [place]: Shower seat;Grab bars - tub/shower;Bedside commode      Prior Function Level of Independence: Independent         Comments: Pt was independent with ADLs, IADLs, and community ambulation.  No falls at home.     Hand Dominance        Extremity/Trunk Assessment    Upper Extremity Assessment Upper Extremity Assessment: Defer to OT evaluation    Lower Extremity Assessment Lower Extremity Assessment: LLE deficits/detail;RLE deficits/detail RLE Deficits / Details: ROM WFL; MMT 4/5; required repeated cues LLE Deficits / Details: ROM WFL; MMT 4/5; required repeated cues    Cervical / Trunk Assessment Cervical / Trunk Assessment: Normal  Communication   Communication: No difficulties  Cognition Arousal/Alertness: Awake/alert Behavior During Therapy: WFL for tasks assessed/performed Overall Cognitive Status: Impaired/Different from baseline Area of Impairment: Orientation;Problem solving;Attention;Following commands;Awareness;Safety/judgement                 Orientation Level: Disoriented to;Place;Time;Situation Current Attention Level: Sustained   Following Commands: Follows one step commands with increased time;Follows one step commands inconsistently (required multimodal cues) Safety/Judgement: Decreased awareness of safety;Decreased awareness of deficits Awareness: Intellectual Problem Solving: Slow processing;Decreased initiation;Difficulty sequencing;Requires verbal cues;Requires tactile cues General Comments: Pt's wife reports mild memory issues at baseline, but this level of confusion is very abnormal.  Pt with difficulty sequencing even for basic task (moving to sit, take steps, wash face).  He was only oriented to self.  Not able to follow multistep commands and required multimodal cues for single step commands.      General Comments General comments (skin integrity, edema, etc.): BP was stable at EOB    Exercises     Assessment/Plan    PT Assessment Patient needs continued PT services  PT Problem List Decreased strength;Decreased mobility;Decreased safety awareness;Decreased coordination;Decreased knowledge of precautions;Decreased activity tolerance;Decreased cognition;Decreased balance;Decreased knowledge of use of  DME;Decreased range of motion       PT Treatment Interventions DME instruction;Therapeutic activities;Cognitive remediation;Gait training;Therapeutic exercise;Patient/family education;Balance training;Functional mobility training    PT Goals (Current goals can be found in the Care Plan section)  Acute Rehab PT Goals Patient Stated Goal: wife agreeable to SNF - prefers [facility] PT Goal Formulation: With patient/family Time For Goal Achievement: 11/02/19 Potential to Achieve Goals: Good    Frequency Min 2X/week   Barriers to discharge        Co-evaluation   Reason for Co-Treatment: Complexity of the patient's impairments (multi-system involvement);For patient/therapist safety   PT started eval and when required increased assistance was able to get OT to assist and entered OT order as pt with decline in ADL abilities.          AM-PAC PT "6 Clicks" Mobility  Outcome Measure Help needed turning from your back to your side while in a flat bed without using bedrails?: A Lot Help needed moving from lying on your back to sitting on the side of a flat bed without using bedrails?: A Lot Help needed moving to and from a bed to a chair (including a wheelchair)?: A Lot Help needed standing up from a chair using your arms (e.g., wheelchair or bedside chair)?: A Lot Help needed to walk in hospital room?: A Lot Help needed climbing 3-5 steps with a railing? : Total 6 Click Score: 11    End of Session Equipment Utilized During Treatment: Gait belt Activity Tolerance: Patient tolerated treatment  well Patient left: in bed;with call bell/phone within reach;with bed alarm set;with family/visitor present Nurse Communication: Mobility status PT Visit Diagnosis: Other abnormalities of gait and mobility (R26.89);Muscle weakness (generalized) (M62.81)    Time: 1017-5102 PT Time Calculation (min) (ACUTE ONLY): 37 min   Charges:   PT Evaluation $PT Eval Moderate Complexity: 1 Melina Schools, PT Acute Rehab Services Pager [phone] [name] [phone]    Karlton Lemon 10/19/2019, 3:09 PM

## 2019-10-19 NOTE — TOC Initial Note (Signed)
Transition of Care [facility]) - Initial/Assessment Note    Patient Details  Name: Brian Wolf MRN: [id number] Date of Birth: 15-Nov-1931  Transition of Care [facility]) CM/SW Contact:    Vinie Sill, [place] Phone Number: 10/19/2019, 4:18 PM  Clinical Narrative:                  CSW spoke with patient's wife,Frieda, via phone. CSW introduced self and explained role. CSW discussed PT recommendation of short term rehab at [facility]. Patient's wife states she agrees with the PT recommendation and requested [facility] as preferred SNF. CSW explained the SNF process. CSW was given permission to send SNF referrals to other SNFs as back up. She states no questions at this time but  expressed feelings of being over whelmed. CSW acknowledge her feeling and advised CSW will have weekend CSW follow up on SNF referral. Patient has received covid vaccines.  Thurmond Butts, MSW, [place] Clinical Social Worker   Expected Discharge Plan: Skilled Nursing Facility Barriers to Discharge: SNF Pending bed offer   Patient Goals and CMS Choice        Expected Discharge Plan and Services Expected Discharge Plan: [place] In-house Referral: Clinical Social Work                                            Prior Living Arrangements/Services   Lives with:: Self, Spouse          Need for Family Participation in Patient Care: Yes (Comment) Care giver support system in place?: Yes (comment)   Criminal Activity/Legal Involvement Pertinent to Current Situation/Hospitalization: No - Comment as needed  Activities of Daily Living      Permission Sought/Granted Permission sought to share information with : Family Supports Permission granted to share information with : Yes, Verbal Permission Granted  Share Information with NAME: Bethany Cumming  Permission granted to share info w AGENCY: SNFs  Permission granted to share info w Relationship: spouse  Permission granted to share info w  Contact Information: [phone] or [phone]  Emotional Assessment   Attitude/Demeanor/Rapport: Unable to Assess Affect (typically observed): Unable to Assess Orientation: : Oriented to Self Alcohol / Substance Use: Not Applicable Psych Involvement: No (comment)  Admission diagnosis:  Hyperkalemia [E87.5] Uremia [N19] Upper GI bleed [K92.2] Acute upper GI bleed [K92.2] AKI (acute kidney injury) ([place]) [N17.9] Symptomatic anemia [D64.9] Patient Active Problem List   Diagnosis Date Noted  . Acute upper GI bleed 10/17/2019  . Hyperkalemia 10/17/2019  . Acute kidney injury superimposed on CKD ([place]) 10/17/2019  . DNR (do not resuscitate) 10/17/2019  . Fall   . History of stroke 03/09/2016  . Sleep apnea 03/09/2016  . SAH (subarachnoid hemorrhage) ([place]) 03/09/2016  . Subarachnoid bleed ([place]) 03/08/2016  . Essential hypertension   . Sleep difficulties   . Homonymous hemianopsia 01/14/2014  . Homonymous hemianopsia due to recent cerebral infarction 12/24/2013  . Left-sided neglect 12/24/2013  . Gout flare 12/20/2013  . Benign essential HTN 12/20/2013  . Acute hemorrhagic infarction of brain ([place]) 12/20/2013  . Hemorrhagic stroke ([place])   . Other secondary acute gout of left foot   . Hypokalemia 12/19/2013  . Altered mental status   . Encounter for central line placement   . Encephalopathy acute   . ICH (intracerebral hemorrhage) ([place]) 12/14/2013  . Fever    PCP:  [name], DO Pharmacy:  [facility] [phone] - [place], [address] AT [address] [place] [address] Phone: [phone] Fax: [phone]     Social Determinants of Health (SDOH) Interventions    Readmission Risk Interventions No flowsheet data found.

## 2019-10-19 NOTE — Progress Notes (Signed)
[facility] Progress Note  Subjective: No complaints today. No signs of active bleeding  Objective: Vital signs in last 24 hours: Temp:  [97.8 F (36.6 C)-98.9 F (37.2 C)] 98.4 F (36.9 C) (09/17 0813) Pulse Rate:  [65-75] 68 (09/17 0813) Resp:  [16-24] 17 (09/17 0813) BP: (123-175)/(45-79) 175/70 (09/17 0813) SpO2:  [96 %-100 %] 98 % (09/17 0813) Weight change:    PE:  No distress  Lab Results: Results for orders placed or performed during the hospital encounter of 10/17/19 (from the past 24 hour(s))  I-STAT, chem 8     Status: Abnormal   Collection Time: 10/18/19 10:33 AM  Result Value Ref Range   Sodium 140 135 - 145 mmol/L   Potassium 5.8 (H) 3.5 - 5.1 mmol/L   Chloride 113 (H) 98 - 111 mmol/L   BUN 108 (H) 8 - 23 mg/dL   Creatinine, Ser 2.20 (H) 0.61 - 1.24 mg/dL   Glucose, Bld 93 70 - 99 mg/dL   Calcium, Ion 1.25 1.15 - 1.40 mmol/L   TCO2 17 (L) 22 - 32 mmol/L   Hemoglobin 7.1 (L) 13.0 - 17.0 g/dL   HCT 21.0 (L) 39 - 52 %  CBC     Status: Abnormal   Collection Time: 10/18/19  6:45 PM  Result Value Ref Range   WBC 10.4 4.0 - 10.5 K/uL   RBC 2.97 (L) 4.22 - 5.81 MIL/uL   Hemoglobin 8.9 (L) 13.0 - 17.0 g/dL   HCT 26.8 (L) 39 - 52 %   MCV 90.2 80.0 - 100.0 fL   MCH 30.0 26.0 - 34.0 pg   MCHC 33.2 30.0 - 36.0 g/dL   RDW 15.6 (H) 11.5 - 15.5 %   Platelets 177 150 - 400 K/uL   nRBC 0.0 0.0 - 0.2 %  CBC with Differential/Platelet     Status: Abnormal   Collection Time: 10/19/19  2:07 AM  Result Value Ref Range   WBC 9.6 4.0 - 10.5 K/uL   RBC 3.08 (L) 4.22 - 5.81 MIL/uL   Hemoglobin 9.3 (L) 13.0 - 17.0 g/dL   HCT 28.0 (L) 39 - 52 %   MCV 90.9 80.0 - 100.0 fL   MCH 30.2 26.0 - 34.0 pg   MCHC 33.2 30.0 - 36.0 g/dL   RDW 15.9 (H) 11.5 - 15.5 %   Platelets 178 150 - 400 K/uL   nRBC 0.0 0.0 - 0.2 %   Neutrophils Relative % 68 %   Neutro Abs 6.5 1.7 - 7.7 K/uL   Lymphocytes Relative 19 %   Lymphs Abs 1.8 0.7 - 4.0 K/uL   Monocytes Relative 10 %    Monocytes Absolute 0.9 0 - 1 K/uL   Eosinophils Relative 2 %   Eosinophils Absolute 0.2 0 - 0 K/uL   Basophils Relative 0 %   Basophils Absolute 0.0 0 - 0 K/uL   Immature Granulocytes 1 %   Abs Immature Granulocytes 0.10 (H) 0.00 - 0.07 K/uL  Comprehensive metabolic panel     Status: Abnormal   Collection Time: 10/19/19  2:07 AM  Result Value Ref Range   Sodium 140 135 - 145 mmol/L   Potassium 5.4 (H) 3.5 - 5.1 mmol/L   Chloride 114 (H) 98 - 111 mmol/L   CO2 19 (L) 22 - 32 mmol/L   Glucose, Bld 98 70 - 99 mg/dL   BUN 77 (H) 8 - 23 mg/dL   Creatinine, Ser 2.02 (H) 0.61 - 1.24 mg/dL   Calcium  8.4 (L) 8.9 - 10.3 mg/dL   Total Protein 5.0 (L) 6.5 - 8.1 g/dL   Albumin 2.5 (L) 3.5 - 5.0 g/dL   AST 35 15 - 41 U/L   ALT 25 0 - 44 U/L   Alkaline Phosphatase 61 38 - 126 U/L   Total Bilirubin 0.8 0.3 - 1.2 mg/dL   GFR calc non Af Amer 29 (L) >60 mL/min   GFR calc Af Amer 34 (L) >60 mL/min   Anion gap 7 5 - 15    Studies/Results: No results found.    Assessment: GI bleed secondary to duodenal ulcer and duodenitis.  Plan:   Continue supportive care. Advance diet as tolerated. Continue PPI therapy. Avoid aspirin and NSAIDs. We will sign off. Call us if needed.    SAM F Shuaib Corsino 10/19/2019, 10:30 AM  Pager: [phone] If no answer or after 5 PM call [phone]

## 2019-10-20 LAB — CBC
HCT: 31.7 % — ABNORMAL LOW (ref 39.0–52.0)
Hemoglobin: 10.3 g/dL — ABNORMAL LOW (ref 13.0–17.0)
MCH: 29.9 pg (ref 26.0–34.0)
MCHC: 32.5 g/dL (ref 30.0–36.0)
MCV: 92.2 fL (ref 80.0–100.0)
Platelets: 220 10*3/uL (ref 150–400)
RBC: 3.44 MIL/uL — ABNORMAL LOW (ref 4.22–5.81)
RDW: 15.6 % — ABNORMAL HIGH (ref 11.5–15.5)
WBC: 11.3 10*3/uL — ABNORMAL HIGH (ref 4.0–10.5)
nRBC: 0.2 % (ref 0.0–0.2)

## 2019-10-20 LAB — BASIC METABOLIC PANEL
Anion gap: 7 (ref 5–15)
BUN: 53 mg/dL — ABNORMAL HIGH (ref 8–23)
CO2: 18 mmol/L — ABNORMAL LOW (ref 22–32)
Calcium: 8.5 mg/dL — ABNORMAL LOW (ref 8.9–10.3)
Chloride: 112 mmol/L — ABNORMAL HIGH (ref 98–111)
Creatinine, Ser: 1.88 mg/dL — ABNORMAL HIGH (ref 0.61–1.24)
GFR calc Af Amer: 37 mL/min — ABNORMAL LOW (ref >60)
GFR calc non Af Amer: 32 mL/min — ABNORMAL LOW (ref >60)
Glucose, Bld: 111 mg/dL — ABNORMAL HIGH (ref 70–99)
Potassium: 5 mmol/L (ref 3.5–5.1)
Sodium: 137 mmol/L (ref 135–145)

## 2019-10-20 LAB — TSH: TSH: 2.706 u[IU]/mL (ref 0.350–4.500)

## 2019-10-20 LAB — AMMONIA: Ammonia: 38 umol/L — ABNORMAL HIGH (ref 9–35)

## 2019-10-20 MED ORDER — SODIUM BICARBONATE 650 MG PO TABS
650.0000 mg | ORAL_TABLET | Freq: Three times a day (TID) | ORAL | Status: AC
  Administered 2019-10-20 – 2019-10-22 (×6): 650 mg via ORAL
  Filled 2019-10-20 (×6): qty 1

## 2019-10-20 NOTE — Progress Notes (Signed)
PROGRESS NOTE    Brian Wolf  RSW:[id number] DOB: 03-Jan-1932 DOA: 10/17/2019 PCP: [name], DO    Brief Narrative:  This 86 y.o. M with hx.of  CVA with mild left sided weakness, and HTN who presented after passing out. Patient was feeling weak and tired, woke up early in the morning, He had several BM, family reports he passed out,  He had blood in the stool and emesis.  EMS were called. In the ER, gross melena on exam, hemoglobin 5.8, started on Protonix and blood transfusion.  Patient underwent EGD found to have duodenal ulcer, no stigmata for recent bleeding.  Hemoglobin stable at 10.3  Patient has developed brief episode of altered mentation which is improved.  Assessment & Plan:   Principal Problem:   Acute upper GI bleed Active Problems:   Benign essential HTN   History of stroke   Hyperkalemia   Acute kidney injury superimposed on CKD ([place])   DNR (do not resuscitate)  Acute upper GI bleed sec.to duodenal ulcer / Acute blood loss anemia. Hgb 5.8 on admission, transfused 3 units, appropriate bump EGD done 9/17, showed duodenal ulcer no stigmata of recent bleeding.  Hgb now stable at 10.3 -Continue PPI BID -Follow up with GI in 2-4 weeks. -Advance diet as tolerated   Acute metabolic encephalopathy >>> Improving  This seems out of proportion to the insult (GI bleed and anemia).    This could be hyperkalemia and renal insufficiency , which are now improved. - TSH : Normal. -CT head : No acute intracranial hemorrhage or infarct. -Hydrate and monitor  -PT/OT -Delirium precautions  CKD IV:   Cr back in May 1.87, now 2.1. Improving to 1.87   This seems to be his basleine.  Doubt AKI -Hydrate and monitor Cr  Hyperkalemia . Improved.  Milly Jakob given  -Stop spironolactone  Cerebrovascular disease, secondary prevention Hypertension  Improved  -Continue Isordil, hydralazine  Sick sinus syndrome Has recently been seeing his EP doctor about bradycardia  and labile blood pressure - TSH appears normal.  BPH -Continue FLomax  DVT prophylaxis:  SCDs. Code Status: Full Family Communication: Wife was at bed side.  Disposition Plan:  Status is: Inpatient  Remains inpatient appropriate because:Inpatient level of care appropriate due to severity of illness   Dispo: The patient is from: Home              Anticipated d/c is to: SNF              Anticipated d/c date is: 1 day              Patient currently clear cleared for discharge awaiting SNF placement.   Consultants:   Gastroenterology  Procedures: Upper GI endoscopy  Antimicrobials:  Anti-infectives (From admission, onward)   None     Subjective: Patient was seen and examined at bedside.  Overnight events noted.  Patient is more alert,  oriented x 3.  She feels better,  denies any dizziness.  Wife is bedside,  all the questions answered.   Objective: Vitals:   10/19/19 2313 10/20/19 0647 10/20/19 0855 10/20/19 1322  BP: (!) 148/69 (!) 176/77 (!) 155/72 124/64  Pulse: 84 81 82 84  Resp: 18 18 18 18   Temp: 98.2 F (36.8 C) 98.5 F (36.9 C) 98 F (36.7 C) 98.3 F (36.8 C)  TempSrc: Oral Oral Oral Oral  SpO2: 100% 100% 100% 100%  Weight:      Height:  Intake/Output Summary (Last 24 hours) at 10/20/2019 1459 Last data filed at 10/20/2019 1347 Gross per 24 hour  Intake 0 ml  Output 150 ml  Net -150 ml   Filed Weights   10/17/19 0744 10/17/19 2009  Weight: 66.7 kg 67.9 kg    Examination:  General exam: Appears calm and comfortable , Alert, oriented x 3 Respiratory system: Clear to auscultation. Respiratory effort normal. Cardiovascular system: S1 & S2 heard, RRR. No JVD, murmurs, rubs, gallops or clicks. No pedal edema. Gastrointestinal system: Abdomen is nondistended, soft and nontender. No organomegaly or masses felt. Normal bowel sounds heard. Central nervous system: Alert and oriented. No focal neurological deficits. Extremities: No edema, no  cyanosis no clubbing. Skin: No rashes, lesions or ulcers Psychiatry: Judgement and insight appear normal. Mood & affect appropriate.     Data Reviewed: I have personally reviewed following labs and imaging studies  CBC: Recent Labs  Lab 10/17/19 0729 10/17/19 0908 10/17/19 1550 10/17/19 1550 10/18/19 0400 10/18/19 1033 10/18/19 1845 10/19/19 0207 10/20/19 0533  WBC 10.7*   < > 13.6*  --  10.7*  --  10.4 9.6 11.3*  NEUTROABS 7.3  --   --   --   --   --   --  6.5  --   HGB 6.9*   < > 6.6*   < > 6.3* 7.1* 8.9* 9.3* 10.3*  HCT 21.7*   < > 20.7*   < > 19.0* 21.0* 26.8* 28.0* 31.7*  MCV 96.4   < > 94.5  --  92.2  --  90.2 90.9 92.2  PLT 231   < > 171  --  179  --  177 178 220   < > = values in this interval not displayed.   Basic Metabolic Panel: Recent Labs  Lab 10/17/19 0903 10/17/19 0908 10/17/19 1550 10/18/19 0400 10/18/19 1033 10/19/19 0207 10/20/19 0533  NA 135   < > 137 138 140 140 137  K 6.5*   < > 5.9* 5.7* 5.8* 5.4* 5.0  CL 113*   < > 113* 115* 113* 114* 112*  CO2 15*  --  15* 18*  --  19* 18*  GLUCOSE 122*   < > 107* 96 93 98 111*  BUN 120*   < > 120* 108* 108* 77* 53*  CREATININE 2.12*   < > 2.09* 2.20* 2.20* 2.02* 1.88*  CALCIUM 8.1*  --  7.9* 8.3*  --  8.4* 8.5*   < > = values in this interval not displayed.   GFR: Estimated Creatinine Clearance: 24.5 mL/min (A) (by C-G formula based on SCr of 1.88 mg/dL (H)). Liver Function Tests: Recent Labs  Lab 10/17/19 0903 10/19/19 0207  AST 31 35  ALT 28 25  ALKPHOS 64 61  BILITOT 0.4 0.8  PROT 5.0* 5.0*  ALBUMIN 2.5* 2.5*   No results for input(s): LIPASE, AMYLASE in the last 168 hours. Recent Labs  Lab 10/20/19 0229  AMMONIA 38*   Coagulation Profile: No results for input(s): INR, PROTIME in the last 168 hours. Cardiac Enzymes: No results for input(s): CKTOTAL, CKMB, CKMBINDEX, TROPONINI in the last 168 hours. BNP (last 3 results) No results for input(s): PROBNP in the last 8760  hours. HbA1C: No results for input(s): HGBA1C in the last 72 hours. CBG: No results for input(s): GLUCAP in the last 168 hours. Lipid Profile: No results for input(s): CHOL, HDL, LDLCALC, TRIG, CHOLHDL, LDLDIRECT in the last 72 hours. Thyroid Function Tests: Recent Labs  10/20/19 0706  TSH 2.706   Anemia Panel: No results for input(s): VITAMINB12, FOLATE, FERRITIN, TIBC, IRON, RETICCTPCT in the last 72 hours. Sepsis Labs: No results for input(s): PROCALCITON, LATICACIDVEN in the last 168 hours.  Recent Results (from the past 240 hour(s))  SARS Coronavirus 2 by RT PCR (hospital order, performed in [facility] hospital lab) Nasopharyngeal Nasopharyngeal Swab     Status: None   Collection Time: 10/17/19  7:29 AM   Specimen: Nasopharyngeal Swab  Result Value Ref Range Status   SARS Coronavirus 2 NEGATIVE NEGATIVE Final    Comment: (NOTE) SARS-CoV-2 target nucleic acids are NOT DETECTED.  The SARS-CoV-2 RNA is generally detectable in upper and lower respiratory specimens during the acute phase of infection. The lowest concentration of SARS-CoV-2 viral copies this assay can detect is 250 copies / mL. A negative result does not preclude SARS-CoV-2 infection and should not be used as the sole basis for treatment or other patient management decisions.  A negative result may occur with improper specimen collection / handling, submission of specimen other than nasopharyngeal swab, presence of viral mutation(s) within the areas targeted by this assay, and inadequate number of viral copies (<250 copies / mL). A negative result must be combined with clinical observations, patient history, and epidemiological information.  Fact Sheet for Patients:   StrictlyIdeas.no  Fact Sheet for Healthcare Providers: BankingDealers.co.za  This test is not yet approved or  cleared by the [place] FDA and has been authorized for detection and/or  diagnosis of SARS-CoV-2 by FDA under an Emergency Use Authorization (EUA).  This EUA will remain in effect (meaning this test can be used) for the duration of the COVID-19 declaration under Section 564(b)(1) of the Act, 21 U.S.C. section 360bbb-3(b)(1), unless the authorization is terminated or revoked sooner.  Performed at [facility], [place] [address] [place], [place] [address]       Radiology Studies: CT HEAD WO CONTRAST  Result Date: 10/20/2019 CLINICAL DATA:  Altered mental status are EXAM: CT HEAD WITHOUT CONTRAST TECHNIQUE: Contiguous axial images were obtained from the base of the skull through the vertex without intravenous contrast. COMPARISON:  03/12/2016 FINDINGS: Brain: Right posterior temporal encephalomalacia is again noted. Previously noted focal hemorrhage within the right para hippocampal gyrus has resolved with a tiny focus of encephalomalacia within this region. Moderate parenchymal volume loss is again seen with prominence of the extra-axial spaces at the cerebral convexities. There are extensive periventricular and subcortical white matter changes again identified likely reflecting the sequela of small vessel ischemia. No abnormal intra or extra-axial mass lesion or fluid collection. No abnormal mass effect or midline shift. No evidence of acute intracranial hemorrhage or infarct. Ventricular size is normal. Cerebellum unremarkable. Vascular: No asymmetric hyperdense vasculature at the skull base. Extensive vascular calcifications are again seen within the carotid siphons and distal vertebral arteries. Skull: Intact Sinuses/Orbits: Paranasal sinuses are clear. Orbits are unremarkable. Other: Mastoid air cells and middle ear cavities are clear. IMPRESSION: No evidence of acute intracranial hemorrhage or infarct. Right temporal encephalomalacia again noted related to remote hemorrhages noted on 12/14/2013 and 03/08/2016. Moderate parenchymal atrophy. Extensive  periventricular white matter changes. These appears stable since prior examination. Electronically Signed   By: Fidela Salisbury MD   On: 10/20/2019 00:01   Scheduled Meds:  sodium chloride   Intravenous Once   hydrALAZINE  50 mg Oral TID   isosorbide dinitrate  30 mg Oral TID   latanoprost  1 drop Both Eyes QHS  pantoprazole  40 mg Oral BID AC   simvastatin  40 mg Oral Daily   sodium chloride flush  3 mL Intravenous Q12H   tamsulosin  0.4 mg Oral Daily   Continuous Infusions:   LOS: 3 days    Time spent: 25 mins.    Shawna Clamp, MD [facility]   If 7PM-7AM, please contact night-coverage

## 2019-10-20 NOTE — Evaluation (Signed)
Clinical/Bedside Swallow Evaluation Patient Details  Name: Brian Wolf MRN: [id number] Date of Birth: [birth year]  Today's Date: 10/20/2019 Time: SLP Start Time (ACUTE ONLY): 1025 SLP Stop Time (ACUTE ONLY): 1038 SLP Time Calculation (min) (ACUTE ONLY): 13 min  Past Medical History:  Past Medical History:  Diagnosis Date  . CVA (cerebral infarction) 12/2013  . Dementia ([place])   . Gastric ulcer    prior to 2000  . Hypertension   . Sleep difficulties    Past Surgical History:  Past Surgical History:  Procedure Laterality Date  . ABDOMINAL SURGERY    . CATARACT EXTRACTION Left   . ESOPHAGOGASTRODUODENOSCOPY N/A 10/18/2019   Procedure: ESOPHAGOGASTRODUODENOSCOPY (EGD);  Surgeon: Wonda Horner, MD;  Location: [facility] ENDOSCOPY;  Service: Endoscopy;  Laterality: N/A;   HPI:  Pt is 86 yo male with PMH of CVA, HTN, bradycardia, possible dementia, CKD 3, and OSA.  He presented with GI bleeding and low Hgb.  GI bleed found to be from duodenal ulcer and duodenitis.   Pt has received 3 units PRBC.   Assessment / Plan / Recommendation Clinical Impression  Patient presents with normal oropharyngeal swallowing function. Oral mastication of bolus efficient with full oral clearance. Swallow initiation appearing swift and patient without overt s/s of aspiration even when challenged with 3 ounces of thin liquid via straw. No SLP f/u indicated.  SLP Visit Diagnosis: Dysphagia, unspecified (R13.10)    Aspiration Risk       Diet Recommendation Regular;Thin liquid   Liquid Administration via: Cup;Straw Medication Administration: Whole meds with liquid Supervision: Patient able to self feed Compensations: Small sips/bites Postural Changes: Seated upright at 90 degrees    Other  Recommendations Oral Care Recommendations: Oral care BID   Follow up Recommendations None        Swallow Study   General HPI: Pt is 86 yo male with PMH of CVA, HTN, bradycardia, possible dementia, CKD 3, and OSA.   He presented with GI bleeding and low Hgb.  GI bleed found to be from duodenal ulcer and duodenitis.   Pt has received 3 units PRBC. Type of Study: Bedside Swallow Evaluation Previous Swallow Assessment: seen in 2015 s/p CVA, recommended regular, thin liquid Diet Prior to this Study: Regular;Thin liquids Temperature Spikes Noted: No Respiratory Status: Room air History of Recent Intubation: No Behavior/Cognition: Alert;Cooperative;Pleasant mood Oral Cavity Assessment: Within Functional Limits Oral Care Completed by SLP: Recent completion by staff Oral Cavity - Dentition: Adequate natural dentition Vision: Functional for self-feeding Self-Feeding Abilities: Able to feed self Patient Positioning: Upright in bed Baseline Vocal Quality: Normal Volitional Cough: Strong Volitional Swallow: Able to elicit    Oral/Motor/Sensory Function Overall Oral Motor/Sensory Function: Within functional limits (subtle right lingual deviation from old CVA per wife)   Retail buyer chips: Not tested   Thin Liquid Thin Liquid: Within functional limits Presentation: Straw;Self Fed    Nectar Thick Nectar Thick Liquid: Not tested   Honey Thick Honey Thick Liquid: Not tested   Puree Puree: Within functional limits Presentation: Spoon;Self Fed   Solid     Solid: Within functional limits Presentation: Lilly MA, CCC-SLP   Gustavo Dispenza Meryl 10/20/2019,10:40 AM

## 2019-10-20 NOTE — TOC Progression Note (Signed)
Transition of Care [facility]) - Progression Note    Patient Details  Name: Brian Wolf MRN: [id number] Date of Birth: 09-Sep-1931  Transition of Care [facility]) CM/SW [place], [place] Phone Number: [phone] 10/20/2019, 11:30 AM  Clinical Narrative:     CSW reached out to [facility] in regards to them reviewing the referral in the hub due to this facility being patient's wife first choice. Facility stated that they would review it and get back to CSW.  TOC team will continue to assist with discharge planning needs.    Expected Discharge Plan: Skilled Nursing Facility Barriers to Discharge: SNF Pending bed offer  Expected Discharge Plan and Services Expected Discharge Plan: [place] In-house Referral: Clinical Social Work                                             Social Determinants of Health (SDOH) Interventions    Readmission Risk Interventions No flowsheet data found.

## 2019-10-21 LAB — CBC
HCT: 29.3 % — ABNORMAL LOW (ref 39.0–52.0)
Hemoglobin: 9.3 g/dL — ABNORMAL LOW (ref 13.0–17.0)
MCH: 29.9 pg (ref 26.0–34.0)
MCHC: 31.7 g/dL (ref 30.0–36.0)
MCV: 94.2 fL (ref 80.0–100.0)
Platelets: 229 10*3/uL (ref 150–400)
RBC: 3.11 MIL/uL — ABNORMAL LOW (ref 4.22–5.81)
RDW: 15.5 % (ref 11.5–15.5)
WBC: 11.5 10*3/uL — ABNORMAL HIGH (ref 4.0–10.5)
nRBC: 0.2 % (ref 0.0–0.2)

## 2019-10-21 LAB — POTASSIUM: Potassium: 4.7 mmol/L (ref 3.5–5.1)

## 2019-10-21 LAB — PHOSPHORUS: Phosphorus: 3.8 mg/dL (ref 2.5–4.6)

## 2019-10-21 LAB — BASIC METABOLIC PANEL
Anion gap: 9 (ref 5–15)
BUN: 49 mg/dL — ABNORMAL HIGH (ref 8–23)
CO2: 20 mmol/L — ABNORMAL LOW (ref 22–32)
Calcium: 8.2 mg/dL — ABNORMAL LOW (ref 8.9–10.3)
Chloride: 107 mmol/L (ref 98–111)
Creatinine, Ser: 2.06 mg/dL — ABNORMAL HIGH (ref 0.61–1.24)
GFR calc Af Amer: 33 mL/min — ABNORMAL LOW (ref >60)
GFR calc non Af Amer: 28 mL/min — ABNORMAL LOW (ref >60)
Glucose, Bld: 95 mg/dL (ref 70–99)
Potassium: 5.1 mmol/L (ref 3.5–5.1)
Sodium: 136 mmol/L (ref 135–145)

## 2019-10-21 LAB — MAGNESIUM: Magnesium: 1.5 mg/dL — ABNORMAL LOW (ref 1.7–2.4)

## 2019-10-21 MED ORDER — MAGNESIUM SULFATE 2 GM/50ML IV SOLN
2.0000 g | Freq: Once | INTRAVENOUS | Status: AC
  Administered 2019-10-21: 2 g via INTRAVENOUS
  Filled 2019-10-21: qty 50

## 2019-10-21 MED ORDER — MAGNESIUM SULFATE 2 GM/50ML IV SOLN
2.0000 g | Freq: Once | INTRAVENOUS | Status: DC
Start: 2019-10-21 — End: 2019-10-21

## 2019-10-21 NOTE — TOC Progression Note (Signed)
Transition of Care [facility]) - Progression Note    Patient Details  Name: Brian Wolf MRN: [id number] Date of Birth: 09-21-1931  Transition of Care [facility]) CM/SW [place], [place] Phone Number: [phone] 10/21/2019, 2:25 PM  Clinical Narrative:     CSW met patient and patient's wife to discuss the discharge plan. Patient is still onboard with going to SNF. CSW provided them with the bed offers however they want patient to go to [facility]. CSW attempted to reach out to [facility] again however had to leave message.  TOC team will continue to assist with discharge planning needs.  Expected Discharge Plan: Skilled Nursing Facility Barriers to Discharge: SNF Pending bed offer  Expected Discharge Plan and Services Expected Discharge Plan: [place] In-house Referral: Clinical Social Work                                             Social Determinants of Health (SDOH) Interventions    Readmission Risk Interventions No flowsheet data found.

## 2019-10-21 NOTE — Progress Notes (Signed)
Pts son, Dr. Geryl Councilman, is requesting for the day shift hospitalist to call him regarding discharge plans for his father. The sons phone number is [phone].

## 2019-10-21 NOTE — Progress Notes (Signed)
Mobility Specialist: Progress Note   10/21/19 1501  Mobility  Activity Ambulated in hall  Level of Assistance Contact guard assist, steadying assist  Assistive Device Front wheel walker  Distance Ambulated (ft) 500 ft  Mobility Response Tolerated well  Mobility performed by Mobility specialist  Bed Position High-fowlers  $Mobility charge 1 Mobility   Pre-Mobility: 97 HR, 132/59 BP, 98% SpO2 Post-Mobility: 83 HR, 154/65 BP, 99% SpO2  Pt was a little wobbly during ambulation, had no c/o feeling light headed or dizziness. RN notified.   [facility] Siboney Requejo Mobility Specialist

## 2019-10-21 NOTE — Progress Notes (Signed)
PROGRESS NOTE    Wyatt Thorstenson  NIO:[id number] DOB: 12/26/1931 DOA: 10/17/2019 PCP: [name], DO    Brief Narrative:  This 86 y.o. M with hx.of  CVA with mild left sided weakness, and HTN who presented after passing out. Patient was feeling weak and tired, woke up early in the morning, He had several BM, family reports he passed out,  He had blood in the stool and emesis.  EMS were called. In the ER, gross melena on exam, hemoglobin 5.8, started on Protonix and blood transfusion.  Patient underwent EGD found to have duodenal ulcer, no stigmata for recent bleeding.  Hemoglobin stable at 10.3  Patient has developed brief episode of altered mentation which is improved. PT recommended SNF, awaiting placement in [place].  Assessment & Plan:   Principal Problem:   Acute upper GI bleed Active Problems:   Benign essential HTN   History of stroke   Hyperkalemia   Acute kidney injury superimposed on CKD ([place])   DNR (do not resuscitate)  Acute upper GI bleed sec.to duodenal ulcer / Acute blood loss anemia. Hgb 5.8 on admission, transfused 3 units,  Hb improved 9.8. EGD done 9/17, showed duodenal ulcer,  no stigmata of recent bleeding.  Hgb now stable at 9.8 -Continue PPI BID -Follow up with GI in 2-4 weeks. -Advance diet as tolerated   Acute metabolic encephalopathy >>> Resolved   This seems out of proportion to the insult (GI bleed and anemia).     This could be hyperkalemia and renal insufficiency , which are now improved. - TSH : Normal. -CT head : No acute intracranial hemorrhage or infarct. -Hydrate and monitor  -PT/OT -Delirium precautions  CKD IV:     Cr back in May 1.87, now 2.1. This seems to be his basleine.  Doubt AKI -Hydrate and monitor Cr  Hyperkalemia . Improved.  -  Lokelma given.  - Stop spironolactone.  Recheck  K in am  Cerebrovascular disease, secondary prevention Hypertension  Improved  -Continue Isordil, hydralazine.  Sick sinus  syndrome Has recently been seeing his EP doctor about bradycardia and labile blood pressure - TSH appears normal.  BPH -Continue FLomax  DVT prophylaxis:  SCDs. Code Status: Full Family Communication: Wife was at bed side.   Spoke with patient's son Dr. Geryl Councilman who was the ED physician in [place].  Disposition Plan:  Status is: Inpatient  Remains inpatient appropriate because:Inpatient level of care appropriate due to severity of illness   Dispo: The patient is from: Home              Anticipated d/c is to: SNF              Anticipated d/c date is: 1 day              Patient currently clear cleared for discharge awaiting SNF placement.   Consultants:   Gastroenterology  Procedures: Upper GI endoscopy  Antimicrobials:  Anti-infectives (From admission, onward)   None     Subjective: Patient was seen and examined at bedside.  Overnight events noted.  Patient reports feeling better,  states he wants to go home with home services.  Explained to him in detail,  He agreed and understood about short-term SNF. Objective: Vitals:   10/20/19 2359 10/21/19 0424 10/21/19 0858 10/21/19 0900  BP: 137/66 (!) 145/68 (!) 143/63 (!) 143/63  Pulse: 79 72 79 81  Resp: 17 18 18    Temp: 97.9 F (36.6 C) 98.1 F (36.7 C) 98.4  F (36.9 C)   TempSrc: Oral Oral Oral   SpO2: 100% 100% 99% 98%  Weight:      Height:        Intake/Output Summary (Last 24 hours) at 10/21/2019 1308 Last data filed at 10/21/2019 0900 Gross per 24 hour  Intake 120 ml  Output 750 ml  Net -630 ml   Filed Weights   10/17/19 0744 10/17/19 2009  Weight: 66.7 kg 67.9 kg    Examination:  General exam: Appears calm and comfortable , Alert, oriented x 3 Respiratory system: Clear to auscultation. Respiratory effort normal. Cardiovascular system: S1 & S2 heard, RRR. No JVD, murmurs, rubs, gallops or clicks. No pedal edema. Gastrointestinal system: Abdomen is nondistended, soft and nontender. No organomegaly  or masses felt.  Normal bowel sounds heard. Central nervous system: Alert and oriented. No focal neurological deficits. Extremities: No edema, no cyanosis no clubbing. Skin: No rashes, lesions or ulcers Psychiatry: Judgement and insight appear normal. Mood & affect appropriate.     Data Reviewed: I have personally reviewed following labs and imaging studies  CBC: Recent Labs  Lab 10/17/19 0729 10/17/19 0908 10/18/19 0400 10/18/19 0400 10/18/19 1033 10/18/19 1845 10/19/19 0207 10/20/19 0533 10/21/19 0547  WBC 10.7*   < > 10.7*  --   --  10.4 9.6 11.3* 11.5*  NEUTROABS 7.3  --   --   --   --   --  6.5  --   --   HGB 6.9*   < > 6.3*   < > 7.1* 8.9* 9.3* 10.3* 9.3*  HCT 21.7*   < > 19.0*   < > 21.0* 26.8* 28.0* 31.7* 29.3*  MCV 96.4   < > 92.2  --   --  90.2 90.9 92.2 94.2  PLT 231   < > 179  --   --  177 178 220 229   < > = values in this interval not displayed.   Basic Metabolic Panel: Recent Labs  Lab 10/17/19 1550 10/17/19 1550 10/18/19 0400 10/18/19 1033 10/19/19 0207 10/20/19 0533 10/21/19 0547  NA 137   < > 138 140 140 137 136  K 5.9*   < > 5.7* 5.8* 5.4* 5.0 5.1  CL 113*   < > 115* 113* 114* 112* 107  CO2 15*  --  18*  --  19* 18* 20*  GLUCOSE 107*   < > 96 93 98 111* 95  BUN 120*   < > 108* 108* 77* 53* 49*  CREATININE 2.09*   < > 2.20* 2.20* 2.02* 1.88* 2.06*  CALCIUM 7.9*  --  8.3*  --  8.4* 8.5* 8.2*  MG  --   --   --   --   --   --  1.5*  PHOS  --   --   --   --   --   --  3.8   < > = values in this interval not displayed.   GFR: Estimated Creatinine Clearance: 22.4 mL/min (A) (by C-G formula based on SCr of 2.06 mg/dL (H)). Liver Function Tests: Recent Labs  Lab 10/17/19 0903 10/19/19 0207  AST 31 35  ALT 28 25  ALKPHOS 64 61  BILITOT 0.4 0.8  PROT 5.0* 5.0*  ALBUMIN 2.5* 2.5*   No results for input(s): LIPASE, AMYLASE in the last 168 hours. Recent Labs  Lab 10/20/19 0229  AMMONIA 38*   Coagulation Profile: No results for input(s):  INR, PROTIME in the last 168 hours. Cardiac  Enzymes: No results for input(s): CKTOTAL, CKMB, CKMBINDEX, TROPONINI in the last 168 hours. BNP (last 3 results) No results for input(s): PROBNP in the last 8760 hours. HbA1C: No results for input(s): HGBA1C in the last 72 hours. CBG: No results for input(s): GLUCAP in the last 168 hours. Lipid Profile: No results for input(s): CHOL, HDL, LDLCALC, TRIG, CHOLHDL, LDLDIRECT in the last 72 hours. Thyroid Function Tests: Recent Labs    10/20/19 0706  TSH 2.706   Anemia Panel: No results for input(s): VITAMINB12, FOLATE, FERRITIN, TIBC, IRON, RETICCTPCT in the last 72 hours. Sepsis Labs: No results for input(s): PROCALCITON, LATICACIDVEN in the last 168 hours.  Recent Results (from the past 240 hour(s))  SARS Coronavirus 2 by RT PCR (hospital order, performed in [facility] hospital lab) Nasopharyngeal Nasopharyngeal Swab     Status: None   Collection Time: 10/17/19  7:29 AM   Specimen: Nasopharyngeal Swab  Result Value Ref Range Status   SARS Coronavirus 2 NEGATIVE NEGATIVE Final    Comment: (NOTE) SARS-CoV-2 target nucleic acids are NOT DETECTED.  The SARS-CoV-2 RNA is generally detectable in upper and lower respiratory specimens during the acute phase of infection. The lowest concentration of SARS-CoV-2 viral copies this assay can detect is 250 copies / mL. A negative result does not preclude SARS-CoV-2 infection and should not be used as the sole basis for treatment or other patient management decisions.  A negative result may occur with improper specimen collection / handling, submission of specimen other than nasopharyngeal swab, presence of viral mutation(s) within the areas targeted by this assay, and inadequate number of viral copies (<250 copies / mL). A negative result must be combined with clinical observations, patient history, and epidemiological information.  Fact Sheet for Patients:    StrictlyIdeas.no  Fact Sheet for Healthcare Providers: BankingDealers.co.za  This test is not yet approved or  cleared by the [place] FDA and has been authorized for detection and/or diagnosis of SARS-CoV-2 by FDA under an Emergency Use Authorization (EUA).  This EUA will remain in effect (meaning this test can be used) for the duration of the COVID-19 declaration under Section 564(b)(1) of the Act, 21 U.S.C. section 360bbb-3(b)(1), unless the authorization is terminated or revoked sooner.  Performed at [facility], [place] [address], [place], [place] [address]       Radiology Studies: CT HEAD WO CONTRAST  Result Date: 10/20/2019 CLINICAL DATA:  Altered mental status are EXAM: CT HEAD WITHOUT CONTRAST TECHNIQUE: Contiguous axial images were obtained from the base of the skull through the vertex without intravenous contrast. COMPARISON:  03/12/2016 FINDINGS: Brain: Right posterior temporal encephalomalacia is again noted. Previously noted focal hemorrhage within the right para hippocampal gyrus has resolved with a tiny focus of encephalomalacia within this region. Moderate parenchymal volume loss is again seen with prominence of the extra-axial spaces at the cerebral convexities. There are extensive periventricular and subcortical white matter changes again identified likely reflecting the sequela of small vessel ischemia. No abnormal intra or extra-axial mass lesion or fluid collection. No abnormal mass effect or midline shift. No evidence of acute intracranial hemorrhage or infarct. Ventricular size is normal. Cerebellum unremarkable. Vascular: No asymmetric hyperdense vasculature at the skull base. Extensive vascular calcifications are again seen within the carotid siphons and distal vertebral arteries. Skull: Intact Sinuses/Orbits: Paranasal sinuses are clear. Orbits are unremarkable. Other: Mastoid air cells and middle ear cavities  are clear. IMPRESSION: No evidence of acute intracranial hemorrhage or infarct. Right temporal encephalomalacia again  noted related to remote hemorrhages noted on 12/14/2013 and 03/08/2016. Moderate parenchymal atrophy. Extensive periventricular white matter changes. These appears stable since prior examination. Electronically Signed   By: Fidela Salisbury MD   On: 10/20/2019 00:01   Scheduled Meds: . sodium chloride   Intravenous Once  . hydrALAZINE  50 mg Oral TID  . isosorbide dinitrate  30 mg Oral TID  . latanoprost  1 drop Both Eyes QHS  . pantoprazole  40 mg Oral BID AC  . simvastatin  40 mg Oral Daily  . sodium bicarbonate  650 mg Oral TID  . sodium chloride flush  3 mL Intravenous Q12H  . tamsulosin  0.4 mg Oral Daily   Continuous Infusions: . magnesium sulfate bolus IVPB       LOS: 4 days    Time spent: 25 mins.    Shawna Clamp, MD [facility]   If 7PM-7AM, please contact night-coverage

## 2019-10-22 DIAGNOSIS — R531 Weakness: Secondary | ICD-10-CM | POA: Diagnosis not present

## 2019-10-22 DIAGNOSIS — G9341 Metabolic encephalopathy: Secondary | ICD-10-CM | POA: Diagnosis not present

## 2019-10-22 DIAGNOSIS — I10 Essential (primary) hypertension: Secondary | ICD-10-CM | POA: Diagnosis not present

## 2019-10-22 DIAGNOSIS — Z743 Need for continuous supervision: Secondary | ICD-10-CM | POA: Diagnosis not present

## 2019-10-22 DIAGNOSIS — T7840XD Allergy, unspecified, subsequent encounter: Secondary | ICD-10-CM | POA: Diagnosis not present

## 2019-10-22 DIAGNOSIS — G4733 Obstructive sleep apnea (adult) (pediatric): Secondary | ICD-10-CM | POA: Diagnosis not present

## 2019-10-22 DIAGNOSIS — N179 Acute kidney failure, unspecified: Secondary | ICD-10-CM | POA: Diagnosis not present

## 2019-10-22 DIAGNOSIS — K922 Gastrointestinal hemorrhage, unspecified: Secondary | ICD-10-CM | POA: Diagnosis not present

## 2019-10-22 DIAGNOSIS — M2550 Pain in unspecified joint: Secondary | ICD-10-CM | POA: Diagnosis not present

## 2019-10-22 DIAGNOSIS — R278 Other lack of coordination: Secondary | ICD-10-CM | POA: Diagnosis not present

## 2019-10-22 DIAGNOSIS — R410 Disorientation, unspecified: Secondary | ICD-10-CM | POA: Diagnosis not present

## 2019-10-22 DIAGNOSIS — N390 Urinary tract infection, site not specified: Secondary | ICD-10-CM | POA: Diagnosis not present

## 2019-10-22 DIAGNOSIS — Z8673 Personal history of transient ischemic attack (TIA), and cerebral infarction without residual deficits: Secondary | ICD-10-CM | POA: Diagnosis not present

## 2019-10-22 DIAGNOSIS — Z7401 Bed confinement status: Secondary | ICD-10-CM | POA: Diagnosis not present

## 2019-10-22 DIAGNOSIS — R2681 Unsteadiness on feet: Secondary | ICD-10-CM | POA: Diagnosis not present

## 2019-10-22 DIAGNOSIS — R58 Hemorrhage, not elsewhere classified: Secondary | ICD-10-CM | POA: Diagnosis not present

## 2019-10-22 DIAGNOSIS — N184 Chronic kidney disease, stage 4 (severe): Secondary | ICD-10-CM | POA: Diagnosis not present

## 2019-10-22 DIAGNOSIS — M6281 Muscle weakness (generalized): Secondary | ICD-10-CM | POA: Diagnosis not present

## 2019-10-22 DIAGNOSIS — E875 Hyperkalemia: Secondary | ICD-10-CM | POA: Diagnosis not present

## 2019-10-22 DIAGNOSIS — K30 Functional dyspepsia: Secondary | ICD-10-CM | POA: Diagnosis not present

## 2019-10-22 LAB — BASIC METABOLIC PANEL
Anion gap: 7 (ref 5–15)
BUN: 44 mg/dL — ABNORMAL HIGH (ref 8–23)
CO2: 21 mmol/L — ABNORMAL LOW (ref 22–32)
Calcium: 8 mg/dL — ABNORMAL LOW (ref 8.9–10.3)
Chloride: 107 mmol/L (ref 98–111)
Creatinine, Ser: 1.79 mg/dL — ABNORMAL HIGH (ref 0.61–1.24)
GFR calc Af Amer: 39 mL/min — ABNORMAL LOW (ref >60)
GFR calc non Af Amer: 34 mL/min — ABNORMAL LOW (ref >60)
Glucose, Bld: 113 mg/dL — ABNORMAL HIGH (ref 70–99)
Potassium: 4.5 mmol/L (ref 3.5–5.1)
Sodium: 135 mmol/L (ref 135–145)

## 2019-10-22 LAB — HEMOGLOBIN AND HEMATOCRIT, BLOOD
HCT: 26.8 % — ABNORMAL LOW (ref 39.0–52.0)
Hemoglobin: 8.9 g/dL — ABNORMAL LOW (ref 13.0–17.0)

## 2019-10-22 LAB — SARS CORONAVIRUS 2 BY RT PCR (HOSPITAL ORDER, PERFORMED IN ~~LOC~~ HOSPITAL LAB): SARS Coronavirus 2: NEGATIVE

## 2019-10-22 MED ORDER — PANTOPRAZOLE SODIUM 40 MG PO TBEC
40.0000 mg | DELAYED_RELEASE_TABLET | Freq: Two times a day (BID) | ORAL | 0 refills | Status: DC
Start: 2019-10-22 — End: 2020-07-01

## 2019-10-22 NOTE — Discharge Summary (Signed)
Physician Discharge Summary  Shyne Lehrke OVF:[id number] DOB: 01-21-1932 DOA: 10/17/2019  PCP: [name], DO  Admit date: 10/17/2019   Discharge date: 10/22/2019  Admitted From:  Home.  Disposition:  [facility].  Recommendations for Outpatient Follow-up:  1. Follow up with PCP in 1-2 weeks. 2. Please obtain BMP/CBC in one week. 3. Advised to follow-up with GI  in 2-4 weeks. 4. Patient has been discharged to [facility] for rehab. 5. Patient's aspirin and NSAID has been discontinued as per GI recommendation.  Home Health: None. Equipment/Devices: None  Discharge Condition: Stable CODE STATUS:Full code Diet recommendation: Heart Healthy  Brief Summary: This 86 y.o.Mwith PMhx.of  CVA with mild left sided weakness, and HTN who presented after passing out. Patient was feeling weak and tired, woke up early in the morning, He had several bloody bowel movements , family reports he passed out,  He had blood in the stool and emesis. EMS were called. In the ER, gross melena was noted on exam, hemoglobin 5.8, started on Protonix and blood transfusion.   Hospital course: Patient was admitted for upper GI bleeding.  Gastroenterology was consulted,  Patient has recieved 3 units of packed red blood cells. Patient underwent EGD,  found to have duodenal ulcer, duodenitis, no stigmata for recent bleeding.  Hemoglobin remains stable after endoscopy. Patient has developed brief episode of altered mentation which has improved. CT head was negative, Labs were unremarkable.  PT recommended SNF, Patient got accepted in [place] home for rehab.  Patient has been discharged, aspirin has been discontinued as per GI recommendation.  Advised to avoid NSAIDs.  Advised to follow-up with gastroenterology in 2 weeks.   He was managed for below problems.  Discharge Diagnoses:  Principal Problem:   Acute upper GI bleed Active Problems:   Benign essential HTN   History of stroke   Hyperkalemia   Acute  kidney injury superimposed on CKD ([place])   DNR (do not resuscitate)  Acute upper GI bleed sec.toduodenal ulcer / Acute blood loss anemia.  Hgb 5.8 on admission, transfused 3 units,  Hb improved 9.8. EGD done 9/17, showed duodenal ulcer,  no stigmata of recent bleeding. Hgb now stable at 9.8 -ContinuePPI BID -Follow up with GI in 2-4 weeks. -Advance diet as tolerated.   Acute metabolic encephalopathy >>> Resolved   This seems out of proportion to the insult (GI bleed and anemia).    This could be hyperkalemia and renal insufficiency , which are now improved. - TSH : Normal. -CT head: No acute intracranial hemorrhage or infarct. -Hydrate and monitor -PT/OT -Delirium precautions  CKD IV:     Cr back in May 1.87, now 2.1.This seems to be his basleine. Doubt AKI -Hydrate and monitor Cr  Hyperkalemia . Improved.  -  Lokelma given.  - Stop spironolactone.   Cerebrovascular disease, secondary prevention Hypertension  Improved  -Continue Isordil, hydralazine.  Sick sinus syndrome Has recently been seeing his EP doctor about bradycardia and labile blood pressure - TSH appears normal.  BPH -ContinueFLomax  Discharge Instructions  Discharge Instructions    Call MD for:  difficulty breathing, headache or visual disturbances   Complete by: As directed    Call MD for:  persistant dizziness or light-headedness   Complete by: As directed    Call MD for:  persistant nausea and vomiting   Complete by: As directed    Diet - low sodium heart healthy   Complete by: As directed    Diet Carb Modified  Complete by: As directed    Discharge instructions   Complete by: As directed    Advised to follow-up with primary care physician in 1 week. Advised to follow-up with a stomach doctor in 2 weeks. Patient has been discharged to labs for rehab. Patient's aspirin and NSAID has been discontinued as per GI recommendation.   Increase activity slowly   Complete by: As  directed      Allergies as of 10/22/2019   No Known Allergies     Medication List    STOP taking these medications   aspirin 81 MG tablet     TAKE these medications   fexofenadine 30 MG tablet Commonly known as: ALLEGRA Take 30 mg by mouth daily as needed (allergies).   hydrALAZINE 50 MG tablet Commonly known as: APRESOLINE Take 1 tablet (50 mg total) by mouth 3 (three) times daily.   isosorbide dinitrate 30 MG tablet Commonly known as: ISORDIL Take 1 tablet (30 mg total) by mouth 3 (three) times daily.   pantoprazole 40 MG tablet Commonly known as: PROTONIX Take 1 tablet (40 mg total) by mouth 2 (two) times daily before a meal.   PROBIOTIC DAILY PO Take 1 capsule by mouth daily.   simvastatin 40 MG tablet Commonly known as: ZOCOR Take 40 mg by mouth daily.   spironolactone 25 MG tablet Commonly known as: ALDACTONE Take 25 mg by mouth daily.   tamsulosin 0.4 MG Caps capsule Commonly known as: FLOMAX Take 0.4 mg by mouth daily. 30 minutes after each meal   Travoprost (BAK Free) 0.004 % Soln ophthalmic solution Commonly known as: TRAVATAN Place 1 drop into both eyes at bedtime.       Follow-up Information    [name], DO Follow up in 1 week(s).   Specialty: Family Medicine Contact information: [address] [place] [address] [phone]        Wonda Horner, MD Follow up in 2 week(s).   Specialty: Gastroenterology Contact information: [address] [place] [address] [phone]              No Known Allergies  Consultations:  Gastroenterology   Procedures/Studies: CT HEAD WO CONTRAST  Result Date: 10/20/2019 CLINICAL DATA:  Altered mental status are EXAM: CT HEAD WITHOUT CONTRAST TECHNIQUE: Contiguous axial images were obtained from the base of the skull through the vertex without intravenous contrast. COMPARISON:  03/12/2016 FINDINGS: Brain: Right posterior temporal encephalomalacia is  again noted. Previously noted focal hemorrhage within the right para hippocampal gyrus has resolved with a tiny focus of encephalomalacia within this region. Moderate parenchymal volume loss is again seen with prominence of the extra-axial spaces at the cerebral convexities. There are extensive periventricular and subcortical white matter changes again identified likely reflecting the sequela of small vessel ischemia. No abnormal intra or extra-axial mass lesion or fluid collection. No abnormal mass effect or midline shift. No evidence of acute intracranial hemorrhage or infarct. Ventricular size is normal. Cerebellum unremarkable. Vascular: No asymmetric hyperdense vasculature at the skull base. Extensive vascular calcifications are again seen within the carotid siphons and distal vertebral arteries. Skull: Intact Sinuses/Orbits: Paranasal sinuses are clear. Orbits are unremarkable. Other: Mastoid air cells and middle ear cavities are clear. IMPRESSION: No evidence of acute intracranial hemorrhage or infarct. Right temporal encephalomalacia again noted related to remote hemorrhages noted on 12/14/2013 and 03/08/2016. Moderate parenchymal atrophy. Extensive periventricular white matter changes. These appears stable since prior examination. Electronically Signed   By: Cassandria Anger  Christa See MD   On: 10/20/2019 00:01     EGD.   Subjective: Patient was seen and examined at bedside.  Overnight events noted.  He denies any nausea, vomiting, dizziness, diarrhea.   Patient is going to be discharged to [facility] today for rehab.  Spoke with son on phone.  Discharge Exam: Vitals:   10/22/19 1007 10/22/19 1214  BP: (!) 172/64 107/68  Pulse:  (!) 101  Resp:  18  Temp:  98.6 F (37 C)  SpO2:  98%   Vitals:   10/22/19 0100 10/22/19 0429 10/22/19 1007 10/22/19 1214  BP: 135/68 (!) 151/69 (!) 172/64 107/68  Pulse:  72  (!) 101  Resp:  19  18  Temp:  97.7 F (36.5 C)  98.6 F (37 C)  TempSrc:  Oral  Oral  SpO2:   99%  98%  Weight:      Height:        General: Pt is alert, awake, not in acute distress Cardiovascular: RRR, S1/S2 +, no rubs, no gallops Respiratory: CTA bilaterally, no wheezing, no rhonchi Abdominal: Soft, NT, ND, bowel sounds + Extremities: no edema, no cyanosis    The results of significant diagnostics from this hospitalization (including imaging, microbiology, ancillary and laboratory) are listed below for reference.     Microbiology: Recent Results (from the past 240 hour(s))  SARS Coronavirus 2 by RT PCR (hospital order, performed in [facility] hospital lab) Nasopharyngeal Nasopharyngeal Swab     Status: None   Collection Time: 10/17/19  7:29 AM   Specimen: Nasopharyngeal Swab  Result Value Ref Range Status   SARS Coronavirus 2 NEGATIVE NEGATIVE Final    Comment: (NOTE) SARS-CoV-2 target nucleic acids are NOT DETECTED.  The SARS-CoV-2 RNA is generally detectable in upper and lower respiratory specimens during the acute phase of infection. The lowest concentration of SARS-CoV-2 viral copies this assay can detect is 250 copies / mL. A negative result does not preclude SARS-CoV-2 infection and should not be used as the sole basis for treatment or other patient management decisions.  A negative result may occur with improper specimen collection / handling, submission of specimen other than nasopharyngeal swab, presence of viral mutation(s) within the areas targeted by this assay, and inadequate number of viral copies (<250 copies / mL). A negative result must be combined with clinical observations, patient history, and epidemiological information.  Fact Sheet for Patients:   StrictlyIdeas.no  Fact Sheet for Healthcare Providers: BankingDealers.co.za  This test is not yet approved or  cleared by the [place] FDA and has been authorized for detection and/or diagnosis of SARS-CoV-2 by FDA under an Emergency Use  Authorization (EUA).  This EUA will remain in effect (meaning this test can be used) for the duration of the COVID-19 declaration under Section 564(b)(1) of the Act, 21 U.S.C. section 360bbb-3(b)(1), unless the authorization is terminated or revoked sooner.  Performed at [facility], [place] [address], [place], [place] [address]      Labs: BNP (last 3 results) No results for input(s): BNP in the last 8760 hours. Basic Metabolic Panel: Recent Labs  Lab 10/18/19 0400 10/18/19 0400 10/18/19 1033 10/18/19 1033 10/19/19 0207 10/20/19 0533 10/21/19 0547 10/21/19 1658 10/22/19 0122  NA 138   < > 140  --  140 137 136  --  135  K 5.7*   < > 5.8*   < > 5.4* 5.0 5.1 4.7 4.5  CL 115*   < > 113*  --  114* 112* 107  --  107  CO2 18*  --   --   --  19* 18* 20*  --  21*  GLUCOSE 96   < > 93  --  98 111* 95  --  113*  BUN 108*   < > 108*  --  77* 53* 49*  --  44*  CREATININE 2.20*   < > 2.20*  --  2.02* 1.88* 2.06*  --  1.79*  CALCIUM 8.3*  --   --   --  8.4* 8.5* 8.2*  --  8.0*  MG  --   --   --   --   --   --  1.5*  --   --   PHOS  --   --   --   --   --   --  3.8  --   --    < > = values in this interval not displayed.   Liver Function Tests: Recent Labs  Lab 10/17/19 0903 10/19/19 0207  AST 31 35  ALT 28 25  ALKPHOS 64 61  BILITOT 0.4 0.8  PROT 5.0* 5.0*  ALBUMIN 2.5* 2.5*   No results for input(s): LIPASE, AMYLASE in the last 168 hours. Recent Labs  Lab 10/20/19 0229  AMMONIA 38*   CBC: Recent Labs  Lab 10/17/19 0729 10/17/19 0908 10/18/19 0400 10/18/19 1033 10/18/19 1845 10/19/19 0207 10/20/19 0533 10/21/19 0547 10/22/19 0122  WBC 10.7*   < > 10.7*  --  10.4 9.6 11.3* 11.5*  --   NEUTROABS 7.3  --   --   --   --  6.5  --   --   --   HGB 6.9*   < > 6.3*   < > 8.9* 9.3* 10.3* 9.3* 8.9*  HCT 21.7*   < > 19.0*   < > 26.8* 28.0* 31.7* 29.3* 26.8*  MCV 96.4   < > 92.2  --  90.2 90.9 92.2 94.2  --   PLT 231   < > 179  --  177 178 220 229  --    < > = values  in this interval not displayed.   Cardiac Enzymes: No results for input(s): CKTOTAL, CKMB, CKMBINDEX, TROPONINI in the last 168 hours. BNP: Invalid input(s): POCBNP CBG: No results for input(s): GLUCAP in the last 168 hours. D-Dimer No results for input(s): DDIMER in the last 72 hours. Hgb A1c No results for input(s): HGBA1C in the last 72 hours. Lipid Profile No results for input(s): CHOL, HDL, LDLCALC, TRIG, CHOLHDL, LDLDIRECT in the last 72 hours. Thyroid function studies Recent Labs    10/20/19 0706  TSH 2.706   Anemia work up No results for input(s): VITAMINB12, FOLATE, FERRITIN, TIBC, IRON, RETICCTPCT in the last 72 hours. Urinalysis    Component Value Date/Time   COLORURINE YELLOW 03/09/2016 0158   APPEARANCEUR HAZY (A) 03/09/2016 0158   LABSPEC 1.015 03/09/2016 0158   PHURINE 5.0 03/09/2016 0158   GLUCOSEU NEGATIVE 03/09/2016 0158   HGBUR NEGATIVE 03/09/2016 0158   BILIRUBINUR NEGATIVE 03/09/2016 0158   KETONESUR NEGATIVE 03/09/2016 0158   PROTEINUR 30 (A) 03/09/2016 0158   UROBILINOGEN 0.2 12/16/2013 0929   NITRITE NEGATIVE 03/09/2016 0158   LEUKOCYTESUR MODERATE (A) 03/09/2016 0158   Sepsis Labs Invalid input(s): PROCALCITONIN,  WBC,  LACTICIDVEN Microbiology Recent Results (from the past 240 hour(s))  SARS Coronavirus 2 by RT PCR (hospital order, performed in [facility] hospital lab) Nasopharyngeal Nasopharyngeal Swab  Status: None   Collection Time: 10/17/19  7:29 AM   Specimen: Nasopharyngeal Swab  Result Value Ref Range Status   SARS Coronavirus 2 NEGATIVE NEGATIVE Final    Comment: (NOTE) SARS-CoV-2 target nucleic acids are NOT DETECTED.  The SARS-CoV-2 RNA is generally detectable in upper and lower respiratory specimens during the acute phase of infection. The lowest concentration of SARS-CoV-2 viral copies this assay can detect is 250 copies / mL. A negative result does not preclude SARS-CoV-2 infection and should not be used as the sole  basis for treatment or other patient management decisions.  A negative result may occur with improper specimen collection / handling, submission of specimen other than nasopharyngeal swab, presence of viral mutation(s) within the areas targeted by this assay, and inadequate number of viral copies (<250 copies / mL). A negative result must be combined with clinical observations, patient history, and epidemiological information.  Fact Sheet for Patients:   StrictlyIdeas.no  Fact Sheet for Healthcare Providers: BankingDealers.co.za  This test is not yet approved or  cleared by the [place] FDA and has been authorized for detection and/or diagnosis of SARS-CoV-2 by FDA under an Emergency Use Authorization (EUA).  This EUA will remain in effect (meaning this test can be used) for the duration of the COVID-19 declaration under Section 564(b)(1) of the Act, 21 U.S.C. section 360bbb-3(b)(1), unless the authorization is terminated or revoked sooner.  Performed at [facility], [place] [address] [place], [place] [address]      Time coordinating discharge: Over 30 minutes  SIGNED:   Shawna Clamp, MD  [facility] 10/22/2019, 1:01 PM Pager   If 7PM-7AM, please contact night-coverage [email or web address]

## 2019-10-22 NOTE — TOC Progression Note (Signed)
Transition of Care [facility]) - Progression Note    Patient Details  Name: Brian Wolf MRN: [id number] Date of Birth: 18-Jan-1932  Transition of Care [facility]) CM/SW [place], [place] Phone Number: 10/22/2019, 2:21 PM  Clinical Narrative:     Started insurance authorization reference # [id number].   Thurmond Butts, MSW, [place] Clinical Social Worker   Expected Discharge Plan: Skilled Nursing Facility Barriers to Discharge: SNF Pending bed offer  Expected Discharge Plan and Services Expected Discharge Plan: [place] In-house Referral: Clinical Social Work       Expected Discharge Date: 10/22/19                                     Social Determinants of Health (SDOH) Interventions    Readmission Risk Interventions No flowsheet data found.

## 2019-10-22 NOTE — Progress Notes (Signed)
Mobility Specialist: Progress Note   10/22/19 1628  Mobility  Activity Refused mobility   Pt refused mobility stating he doesn't want to walk before he discharges.   [facility] Brian Wolf Mobility Specialist

## 2019-10-22 NOTE — TOC Progression Note (Signed)
Transition of Care [facility]) - Progression Note    Patient Details  Name: Brian Wolf MRN: [id number] Date of Birth: [birth year]  Transition of Care [facility]) CM/SW [place], [place] Phone Number: 10/22/2019, 12:04 PM  Clinical Narrative:     CSW spoke with Traci/[facility] [place], informed patient's family wants to accept bed offer. Informed patient is ready for discharge today pending covid test results.   CSW is waiting  for SNF to confirm if they are able admit patient today.  Thurmond Butts, MSW, [place] Clinical Social Worker   Expected Discharge Plan: Skilled Nursing Facility Barriers to Discharge: SNF Pending bed offer  Expected Discharge Plan and Services Expected Discharge Plan: [place] In-house Referral: Clinical Social Work                                             Social Determinants of Health (SDOH) Interventions    Readmission Risk Interventions No flowsheet data found.

## 2019-10-22 NOTE — TOC Transition Note (Signed)
Transition of Care [facility]) - CM/SW Discharge Note   Patient Details  Name: Tobyn Osgood MRN: [id number] Date of Birth: 1931-06-16  Transition of Care [facility]) CM/SW Contact:  Vinie Sill, [place] Phone Number: 10/22/2019, 2:47 PM   Clinical Narrative:     Patient will DC to: [place] Date: 10/22/2019 Family Notified: Joycie Peek Transport [id number]   Per MD patient is ready for discharge. RN, patient, and facility notified of DC. Discharge Summary sent to facility. RN given number for [phone]. Ambulance transport requested for patient.   Clinical Social Worker signing off.  Thurmond Butts, MSW, LCSWA Clinical Social Worker    Final next level of care: Skilled Nursing Facility Barriers to Discharge: Barriers Resolved   Patient Goals and CMS Choice        Discharge Placement PASRR number recieved: 10/19/19            Patient chooses bed at: [facility] Patient to be transferred to facility by: [place] Name of family member notified: [name], spouse Patient and family notified of of transfer: 10/22/19  Discharge Plan and Services In-house Referral: Clinical Social Work                                   Social Determinants of Health (SDOH) Interventions     Readmission Risk Interventions No flowsheet data found.

## 2019-10-22 NOTE — Plan of Care (Signed)

## 2019-10-22 NOTE — Progress Notes (Signed)
Talked to pt's son Dr. Geryl Councilman. Updated on pt's condition, answered all the questions and concerns. Per son patient might leave AMA today if he doesn't get bed in SNF. He asked if attending MD could call him early in the morning for an update, he is planning to take a flight to come help pt this am. Will pass it on to day shift.

## 2019-10-22 NOTE — Discharge Instructions (Signed)
Advised to follow-up with primary care physician in 1 week. Advised to follow-up with a stomach doctor in 2 weeks. Patient has been discharged to labs for rehab. Patient's aspirin and NSAID has been discontinued as per GI recommendation.

## 2019-10-23 ENCOUNTER — Ambulatory Visit: Payer: Medicare Other | Admitting: Adult Health

## 2019-10-23 DIAGNOSIS — M6281 Muscle weakness (generalized): Secondary | ICD-10-CM | POA: Diagnosis not present

## 2019-10-25 ENCOUNTER — Ambulatory Visit: Payer: Medicare Other | Admitting: Student

## 2019-10-26 DIAGNOSIS — G4733 Obstructive sleep apnea (adult) (pediatric): Secondary | ICD-10-CM | POA: Diagnosis not present

## 2019-10-26 DIAGNOSIS — R531 Weakness: Secondary | ICD-10-CM | POA: Diagnosis not present

## 2019-10-27 DIAGNOSIS — M6281 Muscle weakness (generalized): Secondary | ICD-10-CM | POA: Diagnosis not present

## 2019-10-27 DIAGNOSIS — N390 Urinary tract infection, site not specified: Secondary | ICD-10-CM | POA: Diagnosis not present

## 2019-10-29 DIAGNOSIS — M6281 Muscle weakness (generalized): Secondary | ICD-10-CM | POA: Diagnosis not present

## 2019-10-31 ENCOUNTER — Telehealth: Payer: Self-pay | Admitting: Physician Assistant

## 2019-10-31 DIAGNOSIS — Z09 Encounter for follow-up examination after completed treatment for conditions other than malignant neoplasm: Secondary | ICD-10-CM | POA: Diagnosis not present

## 2019-10-31 DIAGNOSIS — I10 Essential (primary) hypertension: Secondary | ICD-10-CM | POA: Diagnosis not present

## 2019-10-31 DIAGNOSIS — I495 Sick sinus syndrome: Secondary | ICD-10-CM | POA: Diagnosis not present

## 2019-10-31 DIAGNOSIS — K922 Gastrointestinal hemorrhage, unspecified: Secondary | ICD-10-CM | POA: Diagnosis not present

## 2019-10-31 NOTE — Telephone Encounter (Signed)
   Patch w/ CHB, HR 31 at one point Occurred at 3:20 pm, day is unclear.   He passed out the morning of the 15th, she forgot to punch the button. It was very early. He turned out to have a bleeding ulcer.   No other occurrences reported to me.    Rosaria Ferries, PA-C 10/31/2019 7:02 PM

## 2019-11-01 DIAGNOSIS — R001 Bradycardia, unspecified: Secondary | ICD-10-CM | POA: Diagnosis not present

## 2019-11-02 NOTE — Progress Notes (Signed)
Thanks so much. Will continue to monitor

## 2019-11-05 ENCOUNTER — Encounter: Payer: Self-pay | Admitting: Student

## 2019-11-05 ENCOUNTER — Ambulatory Visit: Payer: Medicare Other | Admitting: Student

## 2019-11-05 ENCOUNTER — Other Ambulatory Visit: Payer: Self-pay

## 2019-11-05 VITALS — BP 166/80 | HR 86 | Resp 17 | Ht 65.0 in | Wt 152.0 lb

## 2019-11-05 DIAGNOSIS — I495 Sick sinus syndrome: Secondary | ICD-10-CM

## 2019-11-05 DIAGNOSIS — N1832 Chronic kidney disease, stage 3b: Secondary | ICD-10-CM

## 2019-11-05 DIAGNOSIS — I10 Essential (primary) hypertension: Secondary | ICD-10-CM

## 2019-11-05 MED ORDER — NIFEDIPINE ER OSMOTIC RELEASE 60 MG PO TB24: 60.0000 mg | ORAL_TABLET | Freq: Every day | ORAL | 3 refills | Status: DC

## 2019-11-05 NOTE — Patient Instructions (Addendum)
Please inform office if patient's blood pressure remains greater than 130/80 consistently.

## 2019-11-05 NOTE — Progress Notes (Signed)
Primary Physician/Referring:  [name], DO  Patient ID: Brian Wolf, male    DOB: 06/28/1931, 86 y.o.   MRN: [id number]  No chief complaint on file.  HPI:    Brian Wolf  is a 86 y.o. caucasian male with history of stroke in 2015 with no recurrence and has mild residual left facial and also left upper extremity weakness, difficult to control hypertension, hyperlipidemia, sinus node dysfunction.   Patient was admitted to the hospital 10/17/2019 for syncope, found to have upper GI bleed, received 3 units of packed RBCs and underwent EGD revealing duodenal ulcer.  Patient's aspirin was stopped and he was discharged 10/22/2019 to skilled nursing facility. He continues to follow with gastroenterology. Patient evaluated by Dr. Curt Bears, cardiac monitoring revealed no indication for pacemaker at this time.   Since admission to the hospital patient has not been using CPAP. Home blood pressure readings still elevated typically 160/80s, heart rate has improved typically 70-85 bpm. Patient reports his symptoms have significantly improved. Denies dizziness, swelling, syncope, chest pain. Shortness of breath and fatigue have significantly improved since last visit. He reports he is walking up and down the ramp outside his house several times a day without issue.   Past Medical History:  Diagnosis Date  . CVA (cerebral infarction) 12/2013  . Dementia ([place])   . Gastric ulcer    prior to 2000  . Hypertension   . Sleep difficulties    Past Surgical History:  Procedure Laterality Date  . ABDOMINAL SURGERY    . CATARACT EXTRACTION Left   . ESOPHAGOGASTRODUODENOSCOPY N/A 10/18/2019   Procedure: ESOPHAGOGASTRODUODENOSCOPY (EGD);  Surgeon: Wonda Horner, MD;  Location: [facility] ENDOSCOPY;  Service: Endoscopy;  Laterality: N/A;   Family History  Problem Relation Age of Onset  . Hyperlipidemia Mother   . Hypertension Mother   . Hypertension Father     Social History   Tobacco Use  . Smoking  status: Never Smoker  . Smokeless tobacco: Never Used  Substance Use Topics  . Alcohol use: No    Alcohol/week: 0.0 standard drinks   Marital Status: Married  ROS  Review of Systems  Constitutional: Positive for malaise/fatigue (resolved ).  Cardiovascular: Positive for dyspnea on exertion (resovled). Negative for chest pain, leg swelling, orthopnea, palpitations, paroxysmal nocturnal dyspnea and syncope.  Respiratory: Positive for shortness of breath (resolved).   Gastrointestinal: Negative for melena.  Neurological: Negative for dizziness.   Objective  There were no vitals taken for this visit.  Vitals with BMI 10/22/2019 10/22/2019 10/22/2019  Height - - -  Weight - - -  BMI - - -  Systolic 122 449 753  Diastolic 73 68 64  Pulse 96 101 -   Physical Exam Constitutional:      General: He is not in acute distress.    Comments: Patient appears more alert and more energetic than previous visit.   Cardiovascular:     Rate and Rhythm: Normal rate and regular rhythm.     Pulses: Intact distal pulses.          Carotid pulses are 2+ on the right side and 2+ on the left side.      Radial pulses are 2+ on the right side and 2+ on the left side.     Heart sounds: Normal heart sounds. No murmur heard.  No gallop.      Comments: Trace edema bilateral ankle. No JVD.  Pulmonary:     Effort: Pulmonary effort is normal.  Breath sounds: Normal breath sounds.  Abdominal:     General: Bowel sounds are normal.     Palpations: Abdomen is soft.  Neurological:     Mental Status: He is alert.    Laboratory examination:   Recent Labs    10/20/19 0533 10/20/19 0533 10/21/19 0547 10/21/19 1658 10/22/19 0122  NA 137  --  136  --  135  K 5.0   < > 5.1 4.7 4.5  CL 112*  --  107  --  107  CO2 18*  --  20*  --  21*  GLUCOSE 111*  --  95  --  113*  BUN 53*  --  49*  --  44*  CREATININE 1.88*  --  2.06*  --  1.79*  CALCIUM 8.5*  --  8.2*  --  8.0*  GFRNONAA 32*  --  28*  --  34*  GFRAA  37*  --  33*  --  39*   < > = values in this interval not displayed.   CrCl cannot be calculated (Unknown ideal weight.).  CMP Latest Ref Rng & Units 10/22/2019 10/21/2019 10/21/2019  Glucose 70 - 99 mg/dL 113(H) - 95  BUN 8 - 23 mg/dL 44(H) - 49(H)  Creatinine 0.61 - 1.24 mg/dL 1.79(H) - 2.06(H)  Sodium 135 - 145 mmol/L 135 - 136  Potassium 3.5 - 5.1 mmol/L 4.5 4.7 5.1  Chloride 98 - 111 mmol/L 107 - 107  CO2 22 - 32 mmol/L 21(L) - 20(L)  Calcium 8.9 - 10.3 mg/dL 8.0(L) - 8.2(L)  Total Protein 6.5 - 8.1 g/dL - - -  Total Bilirubin 0.3 - 1.2 mg/dL - - -  Alkaline Phos 38 - 126 U/L - - -  AST 15 - 41 U/L - - -  ALT 0 - 44 U/L - - -   CBC Latest Ref Rng & Units 10/22/2019 10/21/2019 10/20/2019  WBC 4.0 - 10.5 K/uL - 11.5(H) 11.3(H)  Hemoglobin 13.0 - 17.0 g/dL 8.9(L) 9.3(L) 10.3(L)  Hematocrit 39 - 52 % 26.8(L) 29.3(L) 31.7(L)  Platelets 150 - 400 K/uL - 229 220    Lipid Panel No results for input(s): CHOL, TRIG, LDLCALC, VLDL, HDL, CHOLHDL, LDLDIRECT in the last 8760 hours.  HEMOGLOBIN A1C Lab Results  Component Value Date   HGBA1C 6.4 (H) 12/15/2013   MPG 137 (H) 12/15/2013   TSH Recent Labs    10/20/19 0706  TSH 2.706    External labs:   Labs 07/17/2019: Hb 12.1/HCT 37.9, platelets 219.  Normal indicis.  Serum glucose 98 mg, BUN 30, creatinine 1.36, EGFR 45 mill, potassium 5.4.  Sodium 144.  CMP otherwise normal.  TSH normal at 4.220.  Medications and allergies  No Known Allergies   Current Outpatient Medications  Medication Instructions  . fexofenadine (ALLEGRA) 30 mg, Oral, Daily PRN  . hydrALAZINE (APRESOLINE) 50 mg, Oral, 3 times daily  . isosorbide dinitrate (ISORDIL) 30 mg, Oral, 3 times daily  . pantoprazole (PROTONIX) 40 mg, Oral, 2 times daily before meals  . Probiotic Product (PROBIOTIC DAILY PO) 1 capsule, Oral, Daily  . simvastatin (ZOCOR) 40 mg, Oral, Daily  . spironolactone (ALDACTONE) 25 mg, Oral, Daily  . tamsulosin (FLOMAX) 0.4 mg, Oral,  Daily, 30 minutes after each meal  . Travoprost, BAK Free, (TRAVATAN) 0.004 % SOLN ophthalmic solution 1 drop, Both Eyes, Daily at bedtime    Radiology:   No results found.  Cardiac Studies:   Echocardiogram 03/09/2016:  Left ventricle: The cavity  size was normal. Wall thickness was  increased in a pattern of mild LVH. Systolic function was  vigorous. The estimated ejection fraction was in the range of 65%  to 70%. Wall motion was normal; there were no regional wall  motion abnormalities. Doppler parameters are consistent with abnormal left ventricular relaxation (grade 1 diastolic dysfunction).  - Aortic valve: Trileaflet; mildly thickened, mildly calcified leaflets.  - Left atrium: The atrium was mildly dilated  Long Term Monitor 11/02/2019:  Max 139 bpm 09:19am, 09/18 Min 31 bpm 05:31am, 09/15 Avg 71 bpm 1.4% PACs 3.2% PVCs Predominant rhythm was sinus rhythm No prolonged arrhythmias noted Multiple episodes of complete AV block with a narrow QRS escape occurred between 5-5:30 AM   EKG EKG 11/05/2019: Sinus rhythm at a rate of 84 bpm with first-degree AV block and 2 PVCs.  Normal axis.  Poor R wave progression, cannot exclude anterior septal infarct old.   EKG 10/11/2019: Ectopic atrial rhythm at 59 bpm. Left axis deviation. Poor R wave progression, cannot exclude anteroseptal infarct old.   EKG 10/04/2019: Sinus node dysfunction with severe bradycardia at 36 bpm. Cannot exclude underlying atrial fibrillation. Will likely need pacemaker.   EKG 09/03/2019: Marked sinus bradycardia at the rate of 49 bpm with first-degree AV block, normal axis, nonspecific T abnormality.    Assessment     ICD-10-CM   1. Sinus node dysfunction (HCC)  I49.5   2. Essential hypertension  I10   3. Stage 3b chronic kidney disease (HCC)  N18.32      There are no discontinued medications.   Recommendations:   Brian Wolf  is a  86 y.o. Caucasian male with history of stroke  in 2015 with no recurrence and has mild residual left facial and also left upper extremity weakness, difficult to control hypertension, hyperlipidemia and sinus node dysfunction.   Patient presents today for follow-up of sinus node dysfunction with severe bradycardia and hypertension.  Today EKG revealed patient with sinus rhythm with first-degree AV block at 84 bpm. Discussed with patient results of long-term monitor from Dr. Curt Bears office. Monitor revealed primarily sinus rhythm with PVC burden of 3.2% and average heart rate of 71 bpm. Dr. Curt Bears determined no indication for pacemaker at this time, I agree. We will continue to avoid negative chronotropic agents. Also encouraged patient to continue to monitor heart rate, and to notify our office if symptoms of bradycardia return.  Patient's blood pressure continues to be elevated. Will add nifedipine 60 mg once daily as it is not a negative chronotropic and will likely improve blood pressure control. Will continue hydralazine 50 mg 3 times daily, isosorbide dinitrate 30 mg 3 times daily, spironolactone 25 mg daily. Patient will continue to monitor blood pressure daily and notify the office if blood pressure remains greater than 130/80 consistently. Also patient is not currently compliant with CPAP use, discussed the importance of wearing CPAP nightly as untreated sleep apnea may be contributing to hypertension. Patient expressed understanding, and agreed to use CPAP daily. Patient is presently asymptomatic, however discussed if symptoms worsen or patient becomes unstable to go to the nearest emergency department for evaluation.   Will follow-up in 6 months for sinus node dysfunction, hypertension.  Patient was seen in collaboration with Dr. Einar Gip. He also reviewed patient's chart and examined the patient. Dr. Einar Gip is in agreement of the plan.    Alethia Berthold, PA-C 11/05/2019, 2:04 PM Office: [phone]

## 2019-11-06 ENCOUNTER — Telehealth: Payer: Self-pay

## 2019-11-06 NOTE — Telephone Encounter (Signed)
Spoke with patient's wife. Patient is doing well and is without symptoms of hypotension. He has done his regular activity and exercise today without issue. Reassured patient's wife about improved blood pressure control with initiation of nifedipine as well as resuming use of patient's CPAP.  Encouraged her to continue to monitor his blood pressure regularly and to let our office know if he begins to experience signs/symptoms of hypotension.  She expressed understanding and agreed.

## 2019-11-06 NOTE — Telephone Encounter (Signed)
Complete

## 2019-11-06 NOTE — Telephone Encounter (Signed)
Patient's wife called that patient's bp was 155/79 last night and today his bp has went down to 118/53 this morning patient's wife also said she started him on the new medication last night she doesn't know if that is related to the new medication patient's wife would like to speak to you please advise

## 2019-11-09 ENCOUNTER — Telehealth: Payer: Self-pay

## 2019-11-09 NOTE — Telephone Encounter (Signed)
Attempted to call the patient regarding results-phone number no longer in service.

## 2019-11-16 DIAGNOSIS — Z23 Encounter for immunization: Secondary | ICD-10-CM | POA: Diagnosis not present

## 2019-11-20 DIAGNOSIS — K269 Duodenal ulcer, unspecified as acute or chronic, without hemorrhage or perforation: Secondary | ICD-10-CM | POA: Diagnosis not present

## 2019-11-21 ENCOUNTER — Other Ambulatory Visit: Payer: Self-pay

## 2019-11-21 DIAGNOSIS — I10 Essential (primary) hypertension: Secondary | ICD-10-CM

## 2019-11-21 MED ORDER — SPIRONOLACTONE 25 MG PO TABS
25.0000 mg | ORAL_TABLET | Freq: Every day | ORAL | 1 refills | Status: DC
Start: 2019-11-21 — End: 2020-04-18

## 2019-11-21 MED ORDER — HYDRALAZINE HCL 50 MG PO TABS
50.0000 mg | ORAL_TABLET | Freq: Three times a day (TID) | ORAL | 0 refills | Status: AC
Start: 2019-11-21 — End: ?

## 2019-11-26 ENCOUNTER — Telehealth: Payer: Self-pay

## 2019-11-26 NOTE — Telephone Encounter (Signed)
Patient wife called and stated that patient's BP is still going up and down and he is still drowsy for the most part of the day. She doesn't know what she should do at this point, because she is concerned about him. Please advise.       BP         HR     OX 165/66       71       98  138/74       77       91

## 2019-11-26 NOTE — Telephone Encounter (Signed)
He can come in and will do six min walk and see heart rate response and also do an EKG. Anderson Malta is fine also

## 2019-11-27 NOTE — Telephone Encounter (Signed)
I called patient, number on file is disconnected. If she calls back, please get a good contact number.

## 2019-11-29 NOTE — Telephone Encounter (Signed)
Attempted to call spouse. No answer/unable to leave vm.

## 2019-11-30 NOTE — Telephone Encounter (Signed)
Called spouse x2. Unable to leave vm. Pt number on file not active (called x2) Please advise. Thanks!

## 2019-12-03 DIAGNOSIS — G4733 Obstructive sleep apnea (adult) (pediatric): Secondary | ICD-10-CM | POA: Diagnosis not present

## 2019-12-03 DIAGNOSIS — Z8719 Personal history of other diseases of the digestive system: Secondary | ICD-10-CM | POA: Diagnosis not present

## 2019-12-03 DIAGNOSIS — I495 Sick sinus syndrome: Secondary | ICD-10-CM | POA: Diagnosis not present

## 2019-12-03 DIAGNOSIS — Z862 Personal history of diseases of the blood and blood-forming organs and certain disorders involving the immune mechanism: Secondary | ICD-10-CM | POA: Diagnosis not present

## 2019-12-03 DIAGNOSIS — I10 Essential (primary) hypertension: Secondary | ICD-10-CM | POA: Diagnosis not present

## 2019-12-13 DIAGNOSIS — E875 Hyperkalemia: Secondary | ICD-10-CM | POA: Diagnosis not present

## 2020-02-11 ENCOUNTER — Ambulatory Visit: Payer: Medicare Other | Admitting: Adult Health

## 2020-02-11 ENCOUNTER — Encounter: Payer: Self-pay | Admitting: Adult Health

## 2020-02-11 VITALS — BP 152/68 | HR 75 | Ht 65.0 in | Wt 153.0 lb

## 2020-02-11 DIAGNOSIS — Z9989 Dependence on other enabling machines and devices: Secondary | ICD-10-CM | POA: Diagnosis not present

## 2020-02-11 DIAGNOSIS — G4733 Obstructive sleep apnea (adult) (pediatric): Secondary | ICD-10-CM

## 2020-02-11 NOTE — Patient Instructions (Signed)
Continue using CPAP nightly and greater than 4 hours each night °If your symptoms worsen or you develop new symptoms please let us know.  ° °

## 2020-02-11 NOTE — Progress Notes (Signed)
PATIENT: Brian Wolf DOB: 1931-06-01  REASON FOR VISIT: follow up HISTORY FROM: patient  HISTORY OF PRESENT ILLNESS: Today 02/11/20:  Brian Wolf is an 87 year old male with a history of obstructive sleep apnea on CPAP.  His download indicates that he uses machine nightly for compliance of 100% he uses machine greater than 4 hours each night.  On average he uses his machine 9 hours and 33 minutes.  His residual AHI is 3.4 on 6 cm of water.  He does not have a significant leak.  Overall he feels that the CPAP continues to work well for him.  He returns today for an evaluation.  HISTORY 10/23/18:  Brian Wolf is an 87 year old male with a history of obstructive sleep apnea on CPAP.  His download indicates that he uses machine 29 out of 30 days for compliance of 97%.  He uses machine greater than 4 hours 28 days for compliance of 93%.  On average he uses his machine 9 hours and 11 minutes.  His residual AHI is 1.7 on 6 cm of water with EPR of 1.  His leak in the 95th percentile is 26.6 L/min.  He denies any new symptoms.  He returns today for evaluation.  REVIEW OF SYSTEMS: Out of a complete 14 system review of symptoms, the patient complains only of the following symptoms, and all other reviewed systems are negative.  FSS 49 ESS 17  ALLERGIES: No Known Allergies  HOME MEDICATIONS: Outpatient Medications Prior to Visit  Medication Sig Dispense Refill  . fexofenadine (ALLEGRA) 30 MG tablet Take 30 mg by mouth daily as needed (allergies).     . hydrALAZINE (APRESOLINE) 50 MG tablet Take 1 tablet (50 mg total) by mouth 3 (three) times daily. 30 tablet 0  . isosorbide dinitrate (ISORDIL) 30 MG tablet Take 1 tablet (30 mg total) by mouth 3 (three) times daily. 90 tablet 2  . NIFEdipine (PROCARDIA XL) 60 MG 24 hr tablet Take 1 tablet (60 mg total) by mouth daily. 30 tablet 3  . pantoprazole (PROTONIX) 40 MG tablet Take 1 tablet (40 mg total) by mouth 2 (two) times daily before a meal. 60  tablet 0  . Probiotic Product (PROBIOTIC DAILY PO) Take 1 capsule by mouth daily.     . simvastatin (ZOCOR) 40 MG tablet Take 40 mg by mouth daily.    Marland Kitchen spironolactone (ALDACTONE) 25 MG tablet Take 1 tablet (25 mg total) by mouth daily. 90 tablet 1  . tamsulosin (FLOMAX) 0.4 MG CAPS capsule Take 0.4 mg by mouth daily. 30 minutes after each meal    . Travoprost, BAK Free, (TRAVATAN) 0.004 % SOLN ophthalmic solution Place 1 drop into both eyes at bedtime.      No facility-administered medications prior to visit.    PAST MEDICAL HISTORY: Past Medical History:  Diagnosis Date  . CVA (cerebral infarction) 12/2013  . Dementia ([place])   . Gastric ulcer    prior to 2000  . Hypertension   . Sleep difficulties     PAST SURGICAL HISTORY: Past Surgical History:  Procedure Laterality Date  . ABDOMINAL SURGERY    . CATARACT EXTRACTION Left   . ESOPHAGOGASTRODUODENOSCOPY N/A 10/18/2019   Procedure: ESOPHAGOGASTRODUODENOSCOPY (EGD);  Surgeon: Wonda Horner, MD;  Location: [facility] ENDOSCOPY;  Service: Endoscopy;  Laterality: N/A;    FAMILY HISTORY: Family History  Problem Relation Age of Onset  . Hyperlipidemia Mother   . Hypertension Mother   . Hypertension Father  SOCIAL HISTORY: Social History   Socioeconomic History  . Marital status: Married    Spouse name: Brian Wolf  . Number of children: 2  . Years of education: college  . Highest education level: Not on file  Occupational History    Comment: retired  Tobacco Use  . Smoking status: Never Smoker  . Smokeless tobacco: Never Used  Vaping Use  . Vaping Use: Never used  Substance and Sexual Activity  . Alcohol use: No    Alcohol/week: 0.0 standard drinks  . Drug use: No  . Sexual activity: Not on file  Other Topics Concern  . Not on file  Social History Narrative   Patient lives at home with his wife Brian Wolf.   Retired.   Education Retail buyer.   Right handed.   Caffeine two cups of coffee daily.   Social Determinants  of Health   Financial Resource Strain: Not on file  Food Insecurity: Not on file  Transportation Needs: Not on file  Physical Activity: Not on file  Stress: Not on file  Social Connections: Not on file  Intimate Partner Violence: Not on file      PHYSICAL EXAM  Vitals:   02/11/20 1057  BP: (!) 152/68  Pulse: 75  Weight: 153 lb (69.4 kg)  Height: 5\' 5"  (1.651 m)   Body mass index is 25.46 kg/m.  Generalized: Well developed, in no acute distress  Chest: Lungs clear to auscultation bilaterally  Neurological examination  Mentation: Alert oriented to time, place, history taking. Follows all commands speech and language fluent Cranial nerve II-XII: Extraocular movements were full, visual field were full on confrontational test Head turning and shoulder shrug  were normal and symmetric. Motor: The motor testing reveals 5 over 5 strength of all 4 extremities. Good symmetric motor tone is noted throughout.  Sensory: Sensory testing is intact to soft touch on all 4 extremities. No evidence of extinction is noted.  Gait and station: Gait is normal.    DIAGNOSTIC DATA (LABS, IMAGING, TESTING) - I reviewed patient records, labs, notes, testing and imaging myself where available.  Lab Results  Component Value Date   WBC 11.5 (H) 10/21/2019   HGB 8.9 (L) 10/22/2019   HCT 26.8 (L) 10/22/2019   MCV 94.2 10/21/2019   PLT 229 10/21/2019      Component Value Date/Time   NA 135 10/22/2019 0122   K 4.5 10/22/2019 0122   CL 107 10/22/2019 0122   CO2 21 (L) 10/22/2019 0122   GLUCOSE 113 (H) 10/22/2019 0122   BUN 44 (H) 10/22/2019 0122   CREATININE 1.79 (H) 10/22/2019 0122   CALCIUM 8.0 (L) 10/22/2019 0122   PROT 5.0 (L) 10/19/2019 0207   ALBUMIN 2.5 (L) 10/19/2019 0207   AST 35 10/19/2019 0207   ALT 25 10/19/2019 0207   ALKPHOS 61 10/19/2019 0207   BILITOT 0.8 10/19/2019 0207   GFRNONAA 34 (L) 10/22/2019 0122   GFRAA 39 (L) 10/22/2019 0122   Lab Results  Component Value  Date   CHOL 147 12/15/2013   HDL 54 12/15/2013   LDLCALC 76 12/15/2013   TRIG 84 12/15/2013   CHOLHDL 2.7 12/15/2013   Lab Results  Component Value Date   HGBA1C 6.4 (H) 12/15/2013   Lab Results  Component Value Date   VITAMINB12 620 12/15/2013   Lab Results  Component Value Date   TSH 2.706 10/20/2019      ASSESSMENT AND PLAN 87 y.o. year old male  has a past medical history  of CVA (cerebral infarction) (12/2013), Dementia ([place]), Gastric ulcer, Hypertension, and Sleep difficulties. here with:  1. OSA on CPAP  - CPAP compliance excellent - Good treatment of AHI  - Encourage patient to use CPAP nightly and > 4 hours each night - F/U in 1 year or sooner if needed   I spent 20 minutes of face-to-face and non-face-to-face time with patient.  This included previsit chart review, lab review, study review, order entry, electronic health record documentation, patient education.  Ward Givens, MSN, NP-C 02/11/2020, 11:04 AM [facility] [address], [place], [place] [address] [phone]

## 2020-03-03 DIAGNOSIS — H35373 Puckering of macula, bilateral: Secondary | ICD-10-CM | POA: Diagnosis not present

## 2020-03-03 DIAGNOSIS — H35033 Hypertensive retinopathy, bilateral: Secondary | ICD-10-CM | POA: Diagnosis not present

## 2020-03-03 DIAGNOSIS — H26493 Other secondary cataract, bilateral: Secondary | ICD-10-CM | POA: Diagnosis not present

## 2020-03-03 DIAGNOSIS — H401131 Primary open-angle glaucoma, bilateral, mild stage: Secondary | ICD-10-CM | POA: Diagnosis not present

## 2020-03-18 DIAGNOSIS — Z862 Personal history of diseases of the blood and blood-forming organs and certain disorders involving the immune mechanism: Secondary | ICD-10-CM | POA: Diagnosis not present

## 2020-03-18 DIAGNOSIS — Z8719 Personal history of other diseases of the digestive system: Secondary | ICD-10-CM | POA: Diagnosis not present

## 2020-03-18 DIAGNOSIS — E785 Hyperlipidemia, unspecified: Secondary | ICD-10-CM | POA: Diagnosis not present

## 2020-03-18 DIAGNOSIS — R7309 Other abnormal glucose: Secondary | ICD-10-CM | POA: Diagnosis not present

## 2020-03-18 DIAGNOSIS — I10 Essential (primary) hypertension: Secondary | ICD-10-CM | POA: Diagnosis not present

## 2020-03-18 DIAGNOSIS — R5383 Other fatigue: Secondary | ICD-10-CM | POA: Diagnosis not present

## 2020-03-24 DIAGNOSIS — E875 Hyperkalemia: Secondary | ICD-10-CM | POA: Diagnosis not present

## 2020-03-25 DIAGNOSIS — I129 Hypertensive chronic kidney disease with stage 1 through stage 4 chronic kidney disease, or unspecified chronic kidney disease: Secondary | ICD-10-CM | POA: Diagnosis not present

## 2020-03-25 DIAGNOSIS — E875 Hyperkalemia: Secondary | ICD-10-CM | POA: Diagnosis not present

## 2020-04-05 ENCOUNTER — Inpatient Hospital Stay (HOSPITAL_COMMUNITY)
Admission: EM | Admit: 2020-04-05 | Discharge: 2020-04-18 | DRG: 377 | Disposition: A | Discharge status: Home / Self Care | Payer: Medicare Other | Attending: Internal Medicine | Admitting: Internal Medicine

## 2020-04-05 ENCOUNTER — Inpatient Hospital Stay (HOSPITAL_COMMUNITY): Payer: Medicare Other

## 2020-04-05 ENCOUNTER — Other Ambulatory Visit: Payer: Self-pay

## 2020-04-05 ENCOUNTER — Encounter (HOSPITAL_COMMUNITY): Payer: Self-pay | Admitting: *Deleted

## 2020-04-05 DIAGNOSIS — R600 Localized edema: Secondary | ICD-10-CM | POA: Diagnosis present

## 2020-04-05 DIAGNOSIS — B964 Proteus (mirabilis) (morganii) as the cause of diseases classified elsewhere: Secondary | ICD-10-CM | POA: Diagnosis not present

## 2020-04-05 DIAGNOSIS — J690 Pneumonitis due to inhalation of food and vomit: Secondary | ICD-10-CM | POA: Diagnosis not present

## 2020-04-05 DIAGNOSIS — I610 Nontraumatic intracerebral hemorrhage in hemisphere, subcortical: Secondary | ICD-10-CM | POA: Diagnosis not present

## 2020-04-05 DIAGNOSIS — G9341 Metabolic encephalopathy: Secondary | ICD-10-CM | POA: Diagnosis not present

## 2020-04-05 DIAGNOSIS — K2950 Unspecified chronic gastritis without bleeding: Secondary | ICD-10-CM | POA: Diagnosis present

## 2020-04-05 DIAGNOSIS — R0902 Hypoxemia: Secondary | ICD-10-CM | POA: Diagnosis not present

## 2020-04-05 DIAGNOSIS — G4730 Sleep apnea, unspecified: Secondary | ICD-10-CM | POA: Diagnosis not present

## 2020-04-05 DIAGNOSIS — Z66 Do not resuscitate: Secondary | ICD-10-CM | POA: Diagnosis present

## 2020-04-05 DIAGNOSIS — F0390 Unspecified dementia without behavioral disturbance: Secondary | ICD-10-CM | POA: Diagnosis present

## 2020-04-05 DIAGNOSIS — R531 Weakness: Secondary | ICD-10-CM | POA: Diagnosis not present

## 2020-04-05 DIAGNOSIS — Z8711 Personal history of peptic ulcer disease: Secondary | ICD-10-CM | POA: Diagnosis not present

## 2020-04-05 DIAGNOSIS — Z7982 Long term (current) use of aspirin: Secondary | ICD-10-CM

## 2020-04-05 DIAGNOSIS — R197 Diarrhea, unspecified: Secondary | ICD-10-CM | POA: Diagnosis not present

## 2020-04-05 DIAGNOSIS — K922 Gastrointestinal hemorrhage, unspecified: Secondary | ICD-10-CM | POA: Diagnosis present

## 2020-04-05 DIAGNOSIS — I5021 Acute systolic (congestive) heart failure: Secondary | ICD-10-CM | POA: Diagnosis not present

## 2020-04-05 DIAGNOSIS — Z83438 Family history of other disorder of lipoprotein metabolism and other lipidemia: Secondary | ICD-10-CM

## 2020-04-05 DIAGNOSIS — E872 Acidosis: Secondary | ICD-10-CM | POA: Diagnosis not present

## 2020-04-05 DIAGNOSIS — S2231XA Fracture of one rib, right side, initial encounter for closed fracture: Secondary | ICD-10-CM | POA: Diagnosis not present

## 2020-04-05 DIAGNOSIS — E669 Obesity, unspecified: Secondary | ICD-10-CM | POA: Diagnosis present

## 2020-04-05 DIAGNOSIS — Z8673 Personal history of transient ischemic attack (TIA), and cerebral infarction without residual deficits: Secondary | ICD-10-CM

## 2020-04-05 DIAGNOSIS — R0602 Shortness of breath: Secondary | ICD-10-CM | POA: Diagnosis not present

## 2020-04-05 DIAGNOSIS — I495 Sick sinus syndrome: Secondary | ICD-10-CM | POA: Diagnosis not present

## 2020-04-05 DIAGNOSIS — D62 Acute posthemorrhagic anemia: Secondary | ICD-10-CM | POA: Diagnosis not present

## 2020-04-05 DIAGNOSIS — Z8249 Family history of ischemic heart disease and other diseases of the circulatory system: Secondary | ICD-10-CM

## 2020-04-05 DIAGNOSIS — N390 Urinary tract infection, site not specified: Secondary | ICD-10-CM | POA: Diagnosis not present

## 2020-04-05 DIAGNOSIS — K264 Chronic or unspecified duodenal ulcer with hemorrhage: Principal | ICD-10-CM | POA: Diagnosis present

## 2020-04-05 DIAGNOSIS — H9190 Unspecified hearing loss, unspecified ear: Secondary | ICD-10-CM | POA: Diagnosis present

## 2020-04-05 DIAGNOSIS — I5043 Acute on chronic combined systolic (congestive) and diastolic (congestive) heart failure: Secondary | ICD-10-CM | POA: Diagnosis present

## 2020-04-05 DIAGNOSIS — K2981 Duodenitis with bleeding: Secondary | ICD-10-CM | POA: Diagnosis present

## 2020-04-05 DIAGNOSIS — I130 Hypertensive heart and chronic kidney disease with heart failure and stage 1 through stage 4 chronic kidney disease, or unspecified chronic kidney disease: Secondary | ICD-10-CM | POA: Diagnosis not present

## 2020-04-05 DIAGNOSIS — Z743 Need for continuous supervision: Secondary | ICD-10-CM | POA: Diagnosis not present

## 2020-04-05 DIAGNOSIS — K921 Melena: Secondary | ICD-10-CM

## 2020-04-05 DIAGNOSIS — N179 Acute kidney failure, unspecified: Secondary | ICD-10-CM | POA: Diagnosis present

## 2020-04-05 DIAGNOSIS — I10 Essential (primary) hypertension: Secondary | ICD-10-CM | POA: Diagnosis present

## 2020-04-05 DIAGNOSIS — J9601 Acute respiratory failure with hypoxia: Secondary | ICD-10-CM | POA: Diagnosis not present

## 2020-04-05 DIAGNOSIS — N183 Chronic kidney disease, stage 3 unspecified: Secondary | ICD-10-CM | POA: Diagnosis not present

## 2020-04-05 DIAGNOSIS — N184 Chronic kidney disease, stage 4 (severe): Secondary | ICD-10-CM | POA: Diagnosis present

## 2020-04-05 DIAGNOSIS — I5023 Acute on chronic systolic (congestive) heart failure: Secondary | ICD-10-CM | POA: Diagnosis not present

## 2020-04-05 DIAGNOSIS — E875 Hyperkalemia: Secondary | ICD-10-CM | POA: Diagnosis not present

## 2020-04-05 DIAGNOSIS — E440 Moderate protein-calorie malnutrition: Secondary | ICD-10-CM | POA: Insufficient documentation

## 2020-04-05 DIAGNOSIS — I69334 Monoplegia of upper limb following cerebral infarction affecting left non-dominant side: Secondary | ICD-10-CM

## 2020-04-05 DIAGNOSIS — G4733 Obstructive sleep apnea (adult) (pediatric): Secondary | ICD-10-CM | POA: Diagnosis present

## 2020-04-05 DIAGNOSIS — Z87898 Personal history of other specified conditions: Secondary | ICD-10-CM | POA: Diagnosis not present

## 2020-04-05 DIAGNOSIS — Z6825 Body mass index (BMI) 25.0-25.9, adult: Secondary | ICD-10-CM

## 2020-04-05 DIAGNOSIS — H409 Unspecified glaucoma: Secondary | ICD-10-CM | POA: Diagnosis present

## 2020-04-05 DIAGNOSIS — J9811 Atelectasis: Secondary | ICD-10-CM | POA: Diagnosis not present

## 2020-04-05 DIAGNOSIS — N1832 Chronic kidney disease, stage 3b: Secondary | ICD-10-CM | POA: Diagnosis not present

## 2020-04-05 DIAGNOSIS — J811 Chronic pulmonary edema: Secondary | ICD-10-CM

## 2020-04-05 DIAGNOSIS — I428 Other cardiomyopathies: Secondary | ICD-10-CM | POA: Diagnosis not present

## 2020-04-05 DIAGNOSIS — J918 Pleural effusion in other conditions classified elsewhere: Secondary | ICD-10-CM | POA: Diagnosis not present

## 2020-04-05 DIAGNOSIS — K269 Duodenal ulcer, unspecified as acute or chronic, without hemorrhage or perforation: Secondary | ICD-10-CM | POA: Diagnosis not present

## 2020-04-05 DIAGNOSIS — K2970 Gastritis, unspecified, without bleeding: Secondary | ICD-10-CM | POA: Diagnosis not present

## 2020-04-05 DIAGNOSIS — E8809 Other disorders of plasma-protein metabolism, not elsewhere classified: Secondary | ICD-10-CM | POA: Diagnosis not present

## 2020-04-05 DIAGNOSIS — N400 Enlarged prostate without lower urinary tract symptoms: Secondary | ICD-10-CM | POA: Diagnosis present

## 2020-04-05 DIAGNOSIS — B9681 Helicobacter pylori [H. pylori] as the cause of diseases classified elsewhere: Secondary | ICD-10-CM | POA: Diagnosis not present

## 2020-04-05 DIAGNOSIS — N170 Acute kidney failure with tubular necrosis: Secondary | ICD-10-CM | POA: Diagnosis present

## 2020-04-05 DIAGNOSIS — I69392 Facial weakness following cerebral infarction: Secondary | ICD-10-CM

## 2020-04-05 DIAGNOSIS — R0600 Dyspnea, unspecified: Secondary | ICD-10-CM | POA: Diagnosis not present

## 2020-04-05 DIAGNOSIS — N189 Chronic kidney disease, unspecified: Secondary | ICD-10-CM | POA: Diagnosis not present

## 2020-04-05 DIAGNOSIS — I440 Atrioventricular block, first degree: Secondary | ICD-10-CM | POA: Diagnosis present

## 2020-04-05 DIAGNOSIS — Z20822 Contact with and (suspected) exposure to covid-19: Secondary | ICD-10-CM | POA: Diagnosis present

## 2020-04-05 DIAGNOSIS — Z79899 Other long term (current) drug therapy: Secondary | ICD-10-CM

## 2020-04-05 DIAGNOSIS — R809 Proteinuria, unspecified: Secondary | ICD-10-CM | POA: Diagnosis present

## 2020-04-05 DIAGNOSIS — I5031 Acute diastolic (congestive) heart failure: Secondary | ICD-10-CM | POA: Diagnosis not present

## 2020-04-05 DIAGNOSIS — I129 Hypertensive chronic kidney disease with stage 1 through stage 4 chronic kidney disease, or unspecified chronic kidney disease: Secondary | ICD-10-CM | POA: Diagnosis not present

## 2020-04-05 DIAGNOSIS — E785 Hyperlipidemia, unspecified: Secondary | ICD-10-CM | POA: Diagnosis present

## 2020-04-05 DIAGNOSIS — J90 Pleural effusion, not elsewhere classified: Secondary | ICD-10-CM | POA: Diagnosis not present

## 2020-04-05 DIAGNOSIS — Z515 Encounter for palliative care: Secondary | ICD-10-CM | POA: Diagnosis not present

## 2020-04-05 DIAGNOSIS — E860 Dehydration: Secondary | ICD-10-CM | POA: Diagnosis present

## 2020-04-05 DIAGNOSIS — R609 Edema, unspecified: Secondary | ICD-10-CM | POA: Diagnosis not present

## 2020-04-05 DIAGNOSIS — J45909 Unspecified asthma, uncomplicated: Secondary | ICD-10-CM | POA: Diagnosis present

## 2020-04-05 DIAGNOSIS — K5900 Constipation, unspecified: Secondary | ICD-10-CM | POA: Diagnosis not present

## 2020-04-05 LAB — CBC WITH DIFFERENTIAL/PLATELET
Abs Immature Granulocytes: 0.09 10*3/uL — ABNORMAL HIGH (ref 0.00–0.07)
Basophils Absolute: 0 10*3/uL (ref 0.0–0.1)
Basophils Relative: 0 %
Eosinophils Absolute: 0.1 10*3/uL (ref 0.0–0.5)
Eosinophils Relative: 1 %
HCT: 21.3 % — ABNORMAL LOW (ref 39.0–52.0)
Hemoglobin: 6.7 g/dL — CL (ref 13.0–17.0)
Immature Granulocytes: 1 %
Lymphocytes Relative: 21 %
Lymphs Abs: 2.2 10*3/uL (ref 0.7–4.0)
MCH: 30.3 pg (ref 26.0–34.0)
MCHC: 31.5 g/dL (ref 30.0–36.0)
MCV: 96.4 fL (ref 80.0–100.0)
Monocytes Absolute: 1.1 10*3/uL — ABNORMAL HIGH (ref 0.1–1.0)
Monocytes Relative: 10 %
Neutro Abs: 6.9 10*3/uL (ref 1.7–7.7)
Neutrophils Relative %: 67 %
Platelets: 251 10*3/uL (ref 150–400)
RBC: 2.21 MIL/uL — ABNORMAL LOW (ref 4.22–5.81)
RDW: 15.7 % — ABNORMAL HIGH (ref 11.5–15.5)
WBC: 10.4 10*3/uL (ref 4.0–10.5)
nRBC: 0.2 % (ref 0.0–0.2)

## 2020-04-05 LAB — POC OCCULT BLOOD, ED: Fecal Occult Bld: POSITIVE — AB

## 2020-04-05 LAB — COMPREHENSIVE METABOLIC PANEL
ALT: 14 U/L (ref 0–44)
AST: 22 U/L (ref 15–41)
Albumin: 2.2 g/dL — ABNORMAL LOW (ref 3.5–5.0)
Alkaline Phosphatase: 41 U/L (ref 38–126)
Anion gap: 10 (ref 5–15)
BUN: 72 mg/dL — ABNORMAL HIGH (ref 8–23)
CO2: 19 mmol/L — ABNORMAL LOW (ref 22–32)
Calcium: 7.9 mg/dL — ABNORMAL LOW (ref 8.9–10.3)
Chloride: 107 mmol/L (ref 98–111)
Creatinine, Ser: 2.63 mg/dL — ABNORMAL HIGH (ref 0.61–1.24)
GFR, Estimated: 23 mL/min — ABNORMAL LOW (ref >60)
Glucose, Bld: 105 mg/dL — ABNORMAL HIGH (ref 70–99)
Potassium: 5 mmol/L (ref 3.5–5.1)
Sodium: 136 mmol/L (ref 135–145)
Total Bilirubin: 0.7 mg/dL (ref 0.3–1.2)
Total Protein: 4.8 g/dL — ABNORMAL LOW (ref 6.5–8.1)

## 2020-04-05 LAB — RESP PANEL BY RT-PCR (FLU A&B, COVID) ARPGX2
Influenza A by PCR: NEGATIVE
Influenza B by PCR: NEGATIVE
SARS Coronavirus 2 by RT PCR: NEGATIVE

## 2020-04-05 LAB — HEMOGLOBIN AND HEMATOCRIT, BLOOD
HCT: 28.5 % — ABNORMAL LOW (ref 39.0–52.0)
HCT: 28.2 % — ABNORMAL LOW (ref 39.0–52.0)
Hemoglobin: 9 g/dL — ABNORMAL LOW (ref 13.0–17.0)
Hemoglobin: 9.1 g/dL — ABNORMAL LOW (ref 13.0–17.0)

## 2020-04-05 LAB — BRAIN NATRIURETIC PEPTIDE: B Natriuretic Peptide: 203.3 pg/mL — ABNORMAL HIGH (ref 0.0–100.0)

## 2020-04-05 LAB — PROTIME-INR
INR: 1 (ref 0.8–1.2)
Prothrombin Time: 12.9 seconds (ref 11.4–15.2)

## 2020-04-05 LAB — TSH: TSH: 4.188 u[IU]/mL (ref 0.350–4.500)

## 2020-04-05 LAB — PREPARE RBC (CROSSMATCH)

## 2020-04-05 MED ORDER — SIMVASTATIN 20 MG PO TABS
40.0000 mg | ORAL_TABLET | Freq: Every day | ORAL | Status: DC
  Administered 2020-04-07 – 2020-04-17 (×11): 40 mg via ORAL
  Filled 2020-04-05 (×12): qty 2

## 2020-04-05 MED ORDER — METOPROLOL TARTRATE 50 MG PO TABS
50.0000 mg | ORAL_TABLET | Freq: Two times a day (BID) | ORAL | Status: DC
  Administered 2020-04-05 – 2020-04-08 (×5): 50 mg via ORAL
  Filled 2020-04-05 (×5): qty 1

## 2020-04-05 MED ORDER — FUROSEMIDE 10 MG/ML IJ SOLN
40.0000 mg | INTRAMUSCULAR | Status: AC
  Administered 2020-04-05: 40 mg via INTRAVENOUS
  Filled 2020-04-05: qty 4

## 2020-04-05 MED ORDER — ISOSORBIDE DINITRATE 30 MG PO TABS
30.0000 mg | ORAL_TABLET | Freq: Three times a day (TID) | ORAL | Status: DC
  Administered 2020-04-05 – 2020-04-18 (×38): 30 mg via ORAL
  Filled 2020-04-05 (×43): qty 1

## 2020-04-05 MED ORDER — SODIUM CHLORIDE 0.9 % IV SOLN: 10.0000 mL/h | Freq: Once | INTRAVENOUS | Status: DC

## 2020-04-05 MED ORDER — HYDRALAZINE HCL 50 MG PO TABS
50.0000 mg | ORAL_TABLET | Freq: Three times a day (TID) | ORAL | Status: DC
  Administered 2020-04-05 – 2020-04-18 (×38): 50 mg via ORAL
  Filled 2020-04-05 (×35): qty 1
  Filled 2020-04-05: qty 2
  Filled 2020-04-05 (×2): qty 1

## 2020-04-05 MED ORDER — ALBUMIN HUMAN 25 % IV SOLN
25.0000 g | Freq: Once | INTRAVENOUS | Status: DC
  Filled 2020-04-05: qty 100

## 2020-04-05 MED ORDER — SODIUM CHLORIDE 0.9 % IV SOLN
8.0000 mg/h | INTRAVENOUS | Status: DC
  Administered 2020-04-05 – 2020-04-06 (×2): 8 mg/h via INTRAVENOUS
  Filled 2020-04-05 (×5): qty 80

## 2020-04-05 MED ORDER — NAPROXEN 500 MG PO TABS: 500.0000 mg | ORAL_TABLET | Freq: Two times a day (BID) | ORAL | 0 refills | Status: DC | PRN

## 2020-04-05 MED ORDER — SODIUM CHLORIDE 0.9 % IV SOLN
80.0000 mg | Freq: Once | INTRAVENOUS | Status: AC
  Administered 2020-04-05: 08:00:00 80 mg via INTRAVENOUS
  Filled 2020-04-05: qty 80

## 2020-04-05 MED ORDER — SODIUM CHLORIDE 0.9% FLUSH
3.0000 mL | Freq: Two times a day (BID) | INTRAVENOUS | Status: DC
  Administered 2020-04-05 – 2020-04-18 (×25): 3 mL via INTRAVENOUS

## 2020-04-05 MED ORDER — NIFEDIPINE ER OSMOTIC RELEASE 60 MG PO TB24
60.0000 mg | ORAL_TABLET | Freq: Every day | ORAL | Status: DC
  Administered 2020-04-05 – 2020-04-07 (×2): 60 mg via ORAL
  Filled 2020-04-05 (×3): qty 1

## 2020-04-05 MED ORDER — LIDOCAINE VISCOUS HCL 2 % MT SOLN: 5.0000 mL | Freq: Four times a day (QID) | OROMUCOSAL | 0 refills | Status: DC | PRN

## 2020-04-05 NOTE — ED Notes (Signed)
ED PA notified of pt hgb. No new orders at this time.

## 2020-04-05 NOTE — Consult Note (Signed)
Referring Provider: [facility] Primary Care Physician:  [name], DO Primary Gastroenterologist: [facility] (formerly seen by Dr. Leonie Douglas and Dr. Acquanetta Sit, both now retired)  Reason for Consultation: GI bleed  HPI: Brian Wolf is a 87 y.o. male being admitted through the emergency room with a 2 day history of melenic stools without syncope or abdominal pain or prodromal dyspepsia, but with some weakness and lack of energy and lack of appetite.    Reported history of ulcer apparently in 2002; documented duodenal ulcer in September 2021 when he presented with melena, coffee-ground emesis, and syncope, endoscopically confirmed, with follow-up in the office showing a negative serologic test for Helicobacter pylori infection.  The patient is not on ulcerogenic medication; although the med list says he is on 81 mg aspirin daily, he defintiely is not taking it per discussion with both the patient and his wife.  Moreover, he has not been taking PPI medication since a course of therapy following his hosptalization   In the emergency room, hemoglobin was 6.7 with a BUN of 72, creatinine 2.63.  For comparison, hemoglobin last fall was 10.7, approximately a month following this hospitalization.  BUN prior to discharge on that occasion was 44 with creatinine of 1.79, so the patient does show evidence of chronic renal insufficiency.       Past Medical History:  Diagnosis Date  . CVA (cerebral infarction) 12/2013  . Dementia ([place])   . Gastric ulcer    prior to 2000  . Hypertension   . Sleep difficulties     Past Surgical History:  Procedure Laterality Date  . ABDOMINAL SURGERY    . CATARACT EXTRACTION Left   . ESOPHAGOGASTRODUODENOSCOPY N/A 10/18/2019   Procedure: ESOPHAGOGASTRODUODENOSCOPY (EGD);  Surgeon: Wonda Horner, MD;  Location: [facility] ENDOSCOPY;  Service: Endoscopy;  Laterality: N/A;    Prior to Admission medications   Medication Sig Start Date End Date  Taking? Authorizing Provider  aspirin 81 MG EC tablet Take 81 mg by mouth daily. 10/12/06  Yes [provider]  fexofenadine (ALLEGRA) 30 MG tablet Take 30 mg by mouth daily as needed (allergies).    Yes [provider]  furosemide (LASIX) 20 MG tablet Take 20 mg by mouth. 03/28/20  Yes [provider]  hydrALAZINE (APRESOLINE) 50 MG tablet Take 1 tablet (50 mg total) by mouth 3 (three) times daily. 11/21/19  Yes Cantwell, Celeste C, PA-C  hydrochlorothiazide (HYDRODIURIL) 25 MG tablet Take 25 mg by mouth daily. 07/16/09  Yes [provider]  isosorbide dinitrate (ISORDIL) 30 MG tablet Take 1 tablet (30 mg total) by mouth 3 (three) times daily. 09/03/19  Yes Adrian Prows, MD  losartan (COZAAR) 100 MG tablet Take 100 mg by mouth daily. 03/18/09  Yes [provider]  metoprolol tartrate (LOPRESSOR) 50 MG tablet Take 50 mg by mouth 2 (two) times daily. 10/07/05  Yes [provider]  NIFEdipine (PROCARDIA XL) 60 MG 24 hr tablet Take 1 tablet (60 mg total) by mouth daily. 11/05/19  Yes Cantwell, Celeste C, PA-C  pantoprazole (PROTONIX) 40 MG tablet Take 1 tablet (40 mg total) by mouth 2 (two) times daily before a meal. 10/22/19  Yes Shawna Clamp, MD  Probiotic Product (PROBIOTIC DAILY PO) Take 1 capsule by mouth daily.    Yes [provider]  simvastatin (ZOCOR) 40 MG tablet Take 40 mg by mouth daily. 11/30/13  Yes [provider]  spironolactone (ALDACTONE) 25 MG tablet Take 1 tablet (25 mg total)  by mouth daily. 11/21/19  Yes Cantwell, Celeste C, PA-C  tamsulosin (FLOMAX) 0.4 MG CAPS capsule Take 0.4 mg by mouth daily. 30 minutes after each meal 06/03/16  Yes [provider]  Travoprost, BAK Free, (TRAVATAN) 0.004 % SOLN ophthalmic solution Place 1 drop into both eyes at bedtime.    Yes [provider]    Current Facility-Administered Medications  Medication Dose Route Frequency Provider Last Rate Last Admin  . 0.9 %   sodium chloride infusion  10 mL/hr Intravenous Once Tamala Julian, Rondell A, MD      . pantoprazole (PROTONIX) 80 mg in sodium chloride 0.9 % 100 mL (0.8 mg/mL) infusion  8 mg/hr Intravenous Continuous Smith, Rondell A, MD      . sodium chloride flush (NS) 0.9 % injection 3 mL  3 mL Intravenous Q12H Norval Morton, MD       Current Outpatient Medications  Medication Sig Dispense Refill  . aspirin 81 MG EC tablet Take 81 mg by mouth daily.    . fexofenadine (ALLEGRA) 30 MG tablet Take 30 mg by mouth daily as needed (allergies).     . furosemide (LASIX) 20 MG tablet Take 20 mg by mouth.    . hydrALAZINE (APRESOLINE) 50 MG tablet Take 1 tablet (50 mg total) by mouth 3 (three) times daily. 30 tablet 0  . hydrochlorothiazide (HYDRODIURIL) 25 MG tablet Take 25 mg by mouth daily.    . isosorbide dinitrate (ISORDIL) 30 MG tablet Take 1 tablet (30 mg total) by mouth 3 (three) times daily. 90 tablet 2  . losartan (COZAAR) 100 MG tablet Take 100 mg by mouth daily.    . metoprolol tartrate (LOPRESSOR) 50 MG tablet Take 50 mg by mouth 2 (two) times daily.    Marland Kitchen NIFEdipine (PROCARDIA XL) 60 MG 24 hr tablet Take 1 tablet (60 mg total) by mouth daily. 30 tablet 3  . pantoprazole (PROTONIX) 40 MG tablet Take 1 tablet (40 mg total) by mouth 2 (two) times daily before a meal. 60 tablet 0  . Probiotic Product (PROBIOTIC DAILY PO) Take 1 capsule by mouth daily.     . simvastatin (ZOCOR) 40 MG tablet Take 40 mg by mouth daily.    Marland Kitchen spironolactone (ALDACTONE) 25 MG tablet Take 1 tablet (25 mg total) by mouth daily. 90 tablet 1  . tamsulosin (FLOMAX) 0.4 MG CAPS capsule Take 0.4 mg by mouth daily. 30 minutes after each meal    . Travoprost, BAK Free, (TRAVATAN) 0.004 % SOLN ophthalmic solution Place 1 drop into both eyes at bedtime.       Allergies as of 04/05/2020  . (No Known Allergies)    Family History  Problem Relation Age of Onset  . Hyperlipidemia Mother   . Hypertension Mother   . Hypertension Father      Social History   Socioeconomic History  . Marital status: Married    Spouse name: [name]  . Number of children: 2  . Years of education: college  . Highest education level: Not on file  Occupational History    Comment: retired  Tobacco Use  . Smoking status: Never Smoker  . Smokeless tobacco: Never Used  Vaping Use  . Vaping Use: Never used  Substance and Sexual Activity  . Alcohol use: No    Alcohol/week: 0.0 standard drinks  . Drug use: No  . Sexual activity: Not on file  Other Topics Concern  . Not on file  Social History Narrative   Patient lives at  home with his wife [name].   Retired.   Education Retail buyer.   Right handed.   Caffeine two cups of coffee daily.   Social Determinants of Health   Financial Resource Strain: Not on file  Food Insecurity: Not on file  Transportation Needs: Not on file  Physical Activity: Not on file  Stress: Not on file  Social Connections: Not on file  Intimate Partner Violence: Not on file    Review of Systems:  No chest pain; no exertional dyspnea.    Physical Exam: Vital signs in last 24 hours: Temp:  [98.1 F (36.7 C)-98.3 F (36.8 C)] 98.3 F (36.8 C) (03/05 0933) Pulse Rate:  [87-102] 89 (03/05 0933) Resp:  [20-28] 28 (03/05 1200) BP: (110-140)/(54-94) 140/65 (03/05 0933) SpO2:  [93 %-96 %] 96 % (03/05 0933) Weight:  [69.4 kg] 69.4 kg (03/05 0641)   General:   Alert,  Well-developed, well-nourished, pleasant and cooperative in NAD Head:  Normocephalic and atraumatic. Lungs:  Clear throughout to auscultation.   No wheezes, crackles, or rhonchi. No evident respiratory distress. Heart:   Regular rate and rhythm; no murmurs, clicks, rubs,  or gallops. Abdomen:  Soft, nontender, and nondistended. No masses, hepatosplenomegaly or ventral hernias noted.  Rectal:  Melena per EDP   Msk:   Symmetrical without gross deformities. Extremities:  2+ pitting tibial edema Neurologic:  Alert and coherent;  grossly normal  neurologically. Skin:  Intact without significant lesions or rashes. Psych:   Alert and cooperative. Normal mood and affect.  Intake/Output from previous day: No intake/output data recorded. Intake/Output this shift: No intake/output data recorded.  Lab Results: Recent Labs    04/05/20 0729  WBC 10.4  HGB 6.7*  HCT 21.3*  PLT 251   BMET Recent Labs    04/05/20 0729  NA 136  K 5.0  CL 107  CO2 19*  GLUCOSE 105*  BUN 72*  CREATININE 2.63*  CALCIUM 7.9*   LFT Recent Labs    04/05/20 0729  PROT 4.8*  ALBUMIN 2.2*  AST 22  ALT 14  ALKPHOS 41  BILITOT 0.7   PT/INR Recent Labs    04/05/20 0729  LABPROT 12.9  INR 1.0    Studies/Results: DG CHEST PORT 1 VIEW  Result Date: 04/05/2020 CLINICAL DATA:  Shortness of breath EXAM: PORTABLE CHEST 1 VIEW COMPARISON:  03/11/2016 chest radiograph FINDINGS: Increased density overlying the RIGHT hemithorax likely represents a pleural effusion. Cardiomediastinal silhouette is unchanged with fullness in the UPPER RIGHT mediastinum. Pulmonary vascular congestion is noted. There may be bilateral LOWER lung opacities/atelectasis present. There is no evidence of pneumothorax. Remote RIGHT rib fractures are present. IMPRESSION: 1. Increased density overlying the RIGHT hemithorax likely representing pleural effusion. 2. Pulmonary vascular congestion and possible bilateral LOWER lung opacities/atelectasis. Electronically Signed   By: Margarette Canada M.D.   On: 04/05/2020 11:29    Impression: 1.  Subacute GI bleed characterized by melenic stool 2.  Posthemorrhagic anemia, severe 3.  Prior history of duodenal ulcer with acute GI bleeding approximately 6 months ago.  Also, remote history of ulcer disease, details not available 4.  Chronic renal insufficiency  Recommendations: 1.  Volume restitution and transfusion as already ordered 2.  Empiric PPI therapy as already ordered 3.  Endoscopic evaluation tomorrow.  Risks reviewed with both  patient and, by telephone, with his wife and they are agreeable.  Will be on standby to scope sooner in the event of acute destabilizing bleeding, but otherwise, it would be safer  for the patient to have the procedure after a period of stabilization.   LOS: 0 days   Youlanda Mighty Buccini  04/05/2020, 12:18 PM   Pager [phone] If no answer or after 5 PM call [phone]

## 2020-04-05 NOTE — Progress Notes (Signed)
Pt states he doesn't want to wear CPAP for the night.

## 2020-04-05 NOTE — ED Provider Notes (Signed)
[facility] EMERGENCY DEPARTMENT Provider Note   CSN: [id number] Arrival date & time: 04/05/20  0555     History Chief Complaint  Patient presents with  . Rectal Bleeding    Brian Wolf is a 87 y.o. male past medical history significant for CVA, dementia, CKD, [place] 2018, gastric ulcer, hypertension, acute upper GI bleed in 2021.  Not anticoagulated. Patient is DNR.  HPI Patient presents to emergency room today with chief complaint of rectal bleeding x1 day.  Patient states when she went to the bathroom this morning his wife noticed that he had bloody stool.  He looked in the toilet and saw black stool. He admits to feeling weak and tired.  He states yesterday was a normal day for him.  He denies any alcohol consumption.  Does not take over-the-counter anti-inflammatories. No medications for symptoms prior to arrival.  Denies any fever, chills, cough, hemoptysis, hematemesis, chest pain, shortness of breath, urinary symptoms.  Chart review shows patient was admitted for acute upper gi bleed in 2021, he was followed by [facility] and wife reports seeing [facility] in office for follow up. During hospital admission patient underwent EGD,  found to have duodenal ulcer, duodenitis.   Past Medical History:  Diagnosis Date  . CVA (cerebral infarction) 12/2013  . Dementia ([place])   . Gastric ulcer    prior to 2000  . Hypertension   . Sleep difficulties     Patient Active Problem List   Diagnosis Date Noted  . GI bleed 04/05/2020  . Acute upper GI bleed 10/17/2019  . Hyperkalemia 10/17/2019  . Acute kidney injury superimposed on CKD ([place]) 10/17/2019  . DNR (do not resuscitate) 10/17/2019  . Fall   . History of stroke 03/09/2016  . Sleep apnea 03/09/2016  . SAH (subarachnoid hemorrhage) ([place]) 03/09/2016  . Subarachnoid bleed ([place]) 03/08/2016  . Essential hypertension   . Sleep difficulties   . Homonymous hemianopsia 01/14/2014  . Homonymous hemianopsia due to  recent cerebral infarction 12/24/2013  . Left-sided neglect 12/24/2013  . Gout flare 12/20/2013  . Benign essential HTN 12/20/2013  . Acute hemorrhagic infarction of brain ([place]) 12/20/2013  . Hemorrhagic stroke ([place])   . Other secondary acute gout of left foot   . Hypokalemia 12/19/2013  . Altered mental status   . Encounter for central line placement   . Encephalopathy acute   . ICH (intracerebral hemorrhage) ([place]) 12/14/2013  . Fever     Past Surgical History:  Procedure Laterality Date  . ABDOMINAL SURGERY    . CATARACT EXTRACTION Left   . ESOPHAGOGASTRODUODENOSCOPY N/A 10/18/2019   Procedure: ESOPHAGOGASTRODUODENOSCOPY (EGD);  Surgeon: Wonda Horner, MD;  Location: [facility] ENDOSCOPY;  Service: Endoscopy;  Laterality: N/A;       Family History  Problem Relation Age of Onset  . Hyperlipidemia Mother   . Hypertension Mother   . Hypertension Father     Social History   Tobacco Use  . Smoking status: Never Smoker  . Smokeless tobacco: Never Used  Vaping Use  . Vaping Use: Never used  Substance Use Topics  . Alcohol use: No    Alcohol/week: 0.0 standard drinks  . Drug use: No    Home Medications Prior to Admission medications   Medication Sig Start Date End Date Taking? Authorizing Provider  aspirin 81 MG EC tablet Take 81 mg by mouth daily. 10/12/06  Yes [provider]  fexofenadine (ALLEGRA) 30 MG tablet Take 30 mg by mouth daily as  needed (allergies).    Yes [provider]  furosemide (LASIX) 20 MG tablet Take 20 mg by mouth. 03/28/20  Yes [provider]  hydrALAZINE (APRESOLINE) 50 MG tablet Take 1 tablet (50 mg total) by mouth 3 (three) times daily. 11/21/19  Yes Cantwell, Celeste C, PA-C  hydrochlorothiazide (HYDRODIURIL) 25 MG tablet Take 25 mg by mouth daily. 07/16/09  Yes [provider]  isosorbide dinitrate (ISORDIL) 30 MG tablet Take 1 tablet (30 mg total) by mouth 3 (three) times daily. 09/03/19  Yes Adrian Prows, MD   losartan (COZAAR) 100 MG tablet Take 100 mg by mouth daily. 03/18/09  Yes [provider]  metoprolol tartrate (LOPRESSOR) 50 MG tablet Take 50 mg by mouth 2 (two) times daily. 10/07/05  Yes [provider]  NIFEdipine (PROCARDIA XL) 60 MG 24 hr tablet Take 1 tablet (60 mg total) by mouth daily. 11/05/19  Yes Cantwell, Celeste C, PA-C  pantoprazole (PROTONIX) 40 MG tablet Take 1 tablet (40 mg total) by mouth 2 (two) times daily before a meal. 10/22/19  Yes Shawna Clamp, MD  Probiotic Product (PROBIOTIC DAILY PO) Take 1 capsule by mouth daily.    Yes [provider]  simvastatin (ZOCOR) 40 MG tablet Take 40 mg by mouth daily. 11/30/13  Yes [provider]  spironolactone (ALDACTONE) 25 MG tablet Take 1 tablet (25 mg total) by mouth daily. 11/21/19  Yes Cantwell, Celeste C, PA-C  tamsulosin (FLOMAX) 0.4 MG CAPS capsule Take 0.4 mg by mouth daily. 30 minutes after each meal 06/03/16  Yes [provider]  Travoprost, BAK Free, (TRAVATAN) 0.004 % SOLN ophthalmic solution Place 1 drop into both eyes at bedtime.    Yes [provider]    Allergies    Patient has no known allergies.  Review of Systems   Review of Systems All other systems are reviewed and are negative for acute change except as noted in the HPI.  Physical Exam Updated Vital Signs BP (!) 114/57 (BP Location: Right Arm)   Pulse 91   Temp 98.1 F (36.7 C) (Oral)   Resp (!) 24   Ht 5\' 5"  (1.651 m)   Wt 69.4 kg   SpO2 95%   BMI 25.46 kg/m   Physical Exam Vitals and nursing note reviewed.  Constitutional:      General: He is not in acute distress.    Appearance: He is not ill-appearing.  HENT:     Head: Normocephalic and atraumatic.     Right Ear: Tympanic membrane and external ear normal.     Left Ear: Tympanic membrane and external ear normal.     Nose: Nose normal.     Mouth/Throat:     Mouth: Mucous membranes are moist.     Pharynx: Oropharynx is clear.  Eyes:      General: No scleral icterus.       Right eye: No discharge.        Left eye: No discharge.     Extraocular Movements: Extraocular movements intact.     Conjunctiva/sclera: Conjunctivae normal.     Pupils: Pupils are equal, round, and reactive to light.  Neck:     Vascular: No JVD.  Cardiovascular:     Rate and Rhythm: Normal rate and regular rhythm.     Pulses: Normal pulses.          Radial pulses are 2+ on the right side and 2+ on the left side.     Heart sounds: Normal heart  sounds.  Pulmonary:     Comments: Lungs clear to auscultation in all fields. Symmetric chest rise. No wheezing, rales, or rhonchi. Abdominal:     Comments: Abdomen is soft, non-distended, and non-tender in all quadrants. No rigidity, no guarding. No peritoneal signs.  Genitourinary:    Comments: Chaperone present for exam. Digital Rectal Exam reveals sphincter with good tone. No external hemorrhoids. No masses or fissures.Gross melena on exam.  Musculoskeletal:        General: Normal range of motion.     Cervical back: Normal range of motion.  Skin:    General: Skin is warm and dry.     Capillary Refill: Capillary refill takes less than 2 seconds.     Coloration: Skin is pale.  Neurological:     Mental Status: He is oriented to person, place, and time.     GCS: GCS eye subscore is 4. GCS verbal subscore is 5. GCS motor subscore is 6.     Comments: Fluent speech, no facial droop.  Psychiatric:        Behavior: Behavior normal.     ED Results / Procedures / Treatments   Labs (all labs ordered are listed, but only abnormal results are displayed) Labs Reviewed  COMPREHENSIVE METABOLIC PANEL - Abnormal; Notable for the following components:      Result Value   CO2 19 (*)    Glucose, Bld 105 (*)    BUN 72 (*)    Creatinine, Ser 2.63 (*)    Calcium 7.9 (*)    Total Protein 4.8 (*)    Albumin 2.2 (*)    GFR, Estimated 23 (*)    All other components within normal limits  CBC WITH DIFFERENTIAL/PLATELET  - Abnormal; Notable for the following components:   RBC 2.21 (*)    Hemoglobin 6.7 (*)    HCT 21.3 (*)    RDW 15.7 (*)    Monocytes Absolute 1.1 (*)    Abs Immature Granulocytes 0.09 (*)    All other components within normal limits  POC OCCULT BLOOD, ED - Abnormal; Notable for the following components:   Fecal Occult Bld POSITIVE (*)    All other components within normal limits  RESP PANEL BY RT-PCR (FLU A&B, COVID) [id number]  PROTIME-INR  TYPE AND SCREEN  PREPARE RBC (CROSSMATCH)    EKG EKG Interpretation  Date/Time:  Saturday April 05 2020 09:25:03 EST Ventricular Rate:  89 PR Interval:    QRS Duration: 95 QT Interval:  356 QTC Calculation: 434 R Axis:   20 Text Interpretation: Sinus rhythm Low voltage, extremity and precordial leads Borderline repolarization abnormality No STEMI Confirmed by Octaviano Glow [phone]) on 04/05/2020 10:05:46 AM   Radiology No results found.  Procedures .Critical Care Performed by: Barrie Folk, PA-C Authorized by: Barrie Folk, PA-C   Critical care provider statement:    Critical care time (minutes):  35   Critical care was time spent personally by me on the following activities:  Development of treatment plan with patient or surrogate, discussions with consultants, evaluation of patient's response to treatment, examination of patient, obtaining history from patient or surrogate, ordering and performing treatments and interventions, ordering and review of laboratory studies, ordering and review of radiographic studies, pulse oximetry, re-evaluation of patient's condition and review of old charts   Care discussed with: admitting provider       Medications Ordered in ED Medications  pantoprazole (PROTONIX) 80 mg in sodium chloride 0.9 % 100 mL (0.8 mg/mL) infusion (  has no administration in time range)  0.9 %  sodium chloride infusion (has no administration in time range)  pantoprazole (PROTONIX) 80 mg in sodium chloride  0.9 % 100 mL IVPB (0 mg Intravenous Stopped 04/05/20 0819)    ED Course  I have reviewed the triage vital signs and the nursing notes.  Pertinent labs & imaging results that were available during my care of the patient were reviewed by me and considered in my medical decision making (see chart for details).  Clinical Course as of 04/05/20 1006  Sat Apr 05, 2020  0728 87 yo male w/ hx of upper GI bleed presenting to Ed with dark colored stools x 2 days, generalized weakness.  Hx of duodenal ulcer and duodenitis.  He denies abdominal pain or nausea currently.  Does not have outpatient GI doctor, seen only in consultation in the hospital.  He has melena on exam, appears tired, no focal abdominal tenderness. No tachycardia.  Pending labs, discussion with GI [MT]  0816 Hemoglobin(!!): 6.7 [MT]  0816 We'll transfuse 1 unit pRBC, discuss with GI, anticipate admission [MT]    Clinical Course User Index [MT] Trifan, Carola Rhine, MD   MDM Rules/Calculators/A&P                          Presenting with rectal bleeding.  On ED arrival patient is afebrile, hemodynamically stable.  He was noted to be tachypneic to 27 respirations in triage, his normal work of breathing on my exam.  He has no abdominal tenderness, no peritoneal signs.  Looks weak and pale, non toxic appearing. Rectal exam with gross melena. Fecal occult positive. CBC without leukocytosis, hemoglobin is 6.7.  Chart review shows baseline appears to be around 9.  That is what it was when he was discharged from the hospital after a bleed last year. INR within normal range.  CMP shows elevated BUN/creatinine.  Today it is 72/2.63, compared to 5 months ago when it was 44/1.79.  He also has hypocalcemia with a calcium of 7.9. Type and screen in process. Protonix bolus and infusion given. Will transfuse patient 1 unit. Covid test collected and is in process. EKG without ischemic changes. This case was discussed with ED supervising physician Dr. Langston Masker  who has seen the patient and agrees with plan to admit.  Consulted on call [facility] and discussed case with Dr. Cristina Gong who agrees to see patient in consult with plan for medical admission, likely have scope tomorrow after transfusion today. Assigned admission. Spoke with Dr. Tamala Julian with hospitalist service who agrees to assume care of patient and bring into the hospital for further evaluation and management.     Portions of this note were generated with Lobbyist. Dictation errors may occur despite best attempts at proofreading.   Final Clinical Impression(s) / ED Diagnoses Final diagnoses:  Gastrointestinal hemorrhage with melena    Rx / DC Orders ED Discharge Orders         Ordered    magic mouthwash (lidocaine, diphenhydrAMINE, alum & mag hydroxide) suspension  Every 6 hours PRN,   Status:  Discontinued        04/05/20 0750    naproxen (NAPROSYN) 500 MG tablet  2 times daily PRN,   Status:  Discontinued        04/05/20 [place], Kaitlyn E, PA-C 04/05/20 1007    Wyvonnia Dusky, MD 04/05/20 1256

## 2020-04-05 NOTE — ED Triage Notes (Signed)
THE PT WOKE UP AND HAD 2 DARK COLORED STOOLS this am  No pain  He has a history of a gi bleed that was  Not cared for as soon as it should have been  So he came early this time

## 2020-04-05 NOTE — H&P (Addendum)
History and Physical    Brian Wolf WCH:[id number] DOB: Mar 01, 1931 DOA: 04/05/2020  Referring MD/NP/PA: Sherol Dade, PA-C PCP: [name], DO  Consultants: Dr. Charolotte Capuchin, [place] - cardiology Patient coming from: Home  Chief Complaint: Dark stools  I have personally briefly reviewed patient's old medical records in [place]   HPI: Brian Wolf is a 87 y.o. male with medical history significant of HTN, CVA bradycardia 2/2 sinus node dysfunction, CKD stage IV, GI bleed with history of duodenal ulcer in 10/2019, and OSA on CPAP presents with complaints of having to dark stools.  History is obtained from the patient and his wife who is present at bedside.  Yesterday, he had not wanted to eat much of anything and was weak.  This morning around 4:30 AM he woke up to use the bathroom, but had to go back to the bathroom very shortly thereafter.  His wife went with him and noticed that he had a formed black stools with blood present in the toilet bowl.  He is not on any blood thinners and reportedly is not on aspirin or any NSAIDs.  Associated symptoms include fatigue, shortness of breath especially when laying flat, and lower extremity swelling over the last month.   Denies having any vomiting ,abdominal pain, or chest pain. Wife notes that this medication had recently been switched due to elevated potassium levels.  ED Course: Upon admission into the emergency department patient was seen to be afebrile with pulse 87-102, respiration 21-27, and all other vital signs maintained.  Labs were significant for WBC 10.4, hemoglobin 6.7, BUN 72, creatinine 2.63, calcium 7.9, and albumin 2.2.  Stool guaiacs were noted to be positive.  Patient was typed and screened and ordered to be transfused 1 unit of packed red blood cells.  Dr. Carlean Purl of GI was consulted.  Protonix drip was also started.  TRH called to admit.  Review of Systems  Constitutional: Positive for  malaise/fatigue. Negative for fever.  HENT: Negative for ear discharge and nosebleeds.   Eyes: Negative for photophobia and pain.  Respiratory: Positive for shortness of breath.   Cardiovascular: Positive for leg swelling. Negative for chest pain.  Gastrointestinal: Positive for melena. Negative for nausea and vomiting.  Genitourinary: Negative for dysuria and hematuria.  Musculoskeletal: Negative for falls.  Neurological: Positive for weakness. Negative for loss of consciousness.  Psychiatric/Behavioral: Negative for substance abuse.    Past Medical History:  Diagnosis Date  . CVA (cerebral infarction) 12/2013  . Dementia ([place])   . Gastric ulcer    prior to 2000  . Hypertension   . Sleep difficulties     Past Surgical History:  Procedure Laterality Date  . ABDOMINAL SURGERY    . CATARACT EXTRACTION Left   . ESOPHAGOGASTRODUODENOSCOPY N/A 10/18/2019   Procedure: ESOPHAGOGASTRODUODENOSCOPY (EGD);  Surgeon: Wonda Horner, MD;  Location: [facility] ENDOSCOPY;  Service: Endoscopy;  Laterality: N/A;     reports that he has never smoked. He has never used smokeless tobacco. He reports that he does not drink alcohol and does not use drugs.  No Known Allergies  Family History  Problem Relation Age of Onset  . Hyperlipidemia Mother   . Hypertension Mother   . Hypertension Father     Prior to Admission medications   Medication Sig Start Date End Date Taking? Authorizing Provider  aspirin 81 MG EC tablet Take 81 mg by mouth daily. 10/12/06  Yes [provider]  hydrochlorothiazide (HYDRODIURIL) 25 MG tablet Take 25 mg  by mouth daily. 07/16/09  Yes [provider]  losartan (COZAAR) 100 MG tablet Take 100 mg by mouth daily. 03/18/09  Yes [provider]  metoprolol tartrate (LOPRESSOR) 50 MG tablet Take 50 mg by mouth 2 (two) times daily. 10/07/05  Yes [provider]  fexofenadine (ALLEGRA) 30 MG tablet Take 30 mg by mouth daily as needed (allergies).      [provider]  furosemide (LASIX) 20 MG tablet Take 20 mg by mouth. 03/28/20   [provider]  hydrALAZINE (APRESOLINE) 50 MG tablet Take 1 tablet (50 mg total) by mouth 3 (three) times daily. 11/21/19   Cantwell, Celeste C, PA-C  isosorbide dinitrate (ISORDIL) 30 MG tablet Take 1 tablet (30 mg total) by mouth 3 (three) times daily. 09/03/19   Adrian Prows, MD  NIFEdipine (PROCARDIA XL) 60 MG 24 hr tablet Take 1 tablet (60 mg total) by mouth daily. 11/05/19   Cantwell, Celeste C, PA-C  pantoprazole (PROTONIX) 40 MG tablet Take 1 tablet (40 mg total) by mouth 2 (two) times daily before a meal. 10/22/19   Shawna Clamp, MD  Probiotic Product (PROBIOTIC DAILY PO) Take 1 capsule by mouth daily.     [provider]  simvastatin (ZOCOR) 40 MG tablet Take 40 mg by mouth daily. 11/30/13   [provider]  spironolactone (ALDACTONE) 25 MG tablet Take 1 tablet (25 mg total) by mouth daily. 11/21/19   Cantwell, Celeste C, PA-C  SPS 15 GM/60ML suspension Take 15 g by mouth once. 03/21/20   [provider]  tamsulosin (FLOMAX) 0.4 MG CAPS capsule Take 0.4 mg by mouth daily. 30 minutes after each meal 06/03/16   [provider]  Travoprost, BAK Free, (TRAVATAN) 0.004 % SOLN ophthalmic solution Place 1 drop into both eyes at bedtime.     [provider]    Physical Exam:  Constitutional: Elderly male currently in no distress Vitals:   04/05/20 0715 04/05/20 0748 04/05/20 0800 04/05/20 0830  BP:  121/64 136/65 134/61  Pulse: 91 90 91 88  Resp:  20 (!) 25 (!) 26  Temp:  98.1 F (36.7 C)    TempSrc:      SpO2: 93% 94% 94% 93%  Weight:      Height:       Eyes: PERRL, lids and conjunctivae normal ENMT: Mucous membranes are moist. Posterior pharynx clear of any exudate or lesions.  Neck: normal, supple, no masses, no thyromegaly Respiratory: Tachypneic with decreased aeration noted in the right lung base.  O2 saturation currently maintained on room  air. Cardiovascular: Regular rate and rhythm, no murmurs / rubs / gallops.  2+ pitting extremity edema. 2+ pedal pulses. No carotid bruits.  Abdomen: no tenderness, no masses palpated. No hepatosplenomegaly. Bowel sounds positive.  Musculoskeletal: no clubbing / cyanosis. No joint deformity upper and lower extremities. Good ROM, no contractures. Normal muscle tone.  Skin: Pallor present. Neurologic: CN 2-12 grossly intact.  Able to move all extremities.  No facial weakness noted on the right. Psychiatric: Normal judgment and insight. Alert and oriented x 3. Normal mood.     Labs on Admission: I have personally reviewed following labs and imaging studies  CBC: Recent Labs  Lab 04/05/20 0729  WBC 10.4  NEUTROABS 6.9  HGB 6.7*  HCT 21.3*  MCV 96.4  PLT 354   Basic Metabolic Panel: Recent Labs  Lab 04/05/20 0729  NA 136  K 5.0  CL 107  CO2 19*  GLUCOSE 105*  BUN 72*  CREATININE 2.63*  CALCIUM 7.9*   GFR: Estimated Creatinine Clearance: 17.5 mL/min (A) (by C-G formula based on SCr of 2.63 mg/dL (H)). Liver Function Tests: Recent Labs  Lab 04/05/20 0729  AST 22  ALT 14  ALKPHOS 41  BILITOT 0.7  PROT 4.8*  ALBUMIN 2.2*   No results for input(s): LIPASE, AMYLASE in the last 168 hours. No results for input(s): AMMONIA in the last 168 hours. Coagulation Profile: Recent Labs  Lab 04/05/20 0729  INR 1.0   Cardiac Enzymes: No results for input(s): CKTOTAL, CKMB, CKMBINDEX, TROPONINI in the last 168 hours. BNP (last 3 results) No results for input(s): PROBNP in the last 8760 hours. HbA1C: No results for input(s): HGBA1C in the last 72 hours. CBG: No results for input(s): GLUCAP in the last 168 hours. Lipid Profile: No results for input(s): CHOL, HDL, LDLCALC, TRIG, CHOLHDL, LDLDIRECT in the last 72 hours. Thyroid Function Tests: No results for input(s): TSH, T4TOTAL, FREET4, T3FREE, THYROIDAB in the last 72 hours. Anemia Panel: No results for input(s):  VITAMINB12, FOLATE, FERRITIN, TIBC, IRON, RETICCTPCT in the last 72 hours. Urine analysis:    Component Value Date/Time   COLORURINE YELLOW 03/09/2016 0158   APPEARANCEUR HAZY (A) 03/09/2016 0158   LABSPEC 1.015 03/09/2016 0158   PHURINE 5.0 03/09/2016 0158   GLUCOSEU NEGATIVE 03/09/2016 0158   HGBUR NEGATIVE 03/09/2016 0158   BILIRUBINUR NEGATIVE 03/09/2016 0158   KETONESUR NEGATIVE 03/09/2016 0158   PROTEINUR 30 (A) 03/09/2016 0158   UROBILINOGEN 0.2 12/16/2013 0929   NITRITE NEGATIVE 03/09/2016 0158   LEUKOCYTESUR MODERATE (A) 03/09/2016 0158   Sepsis Labs: No results found for this or any previous visit (from the past 240 hour(s)).   Radiological Exams on Admission: No results found.  EKG: Independently reviewed.  Sinus rhythm at 89 bpm with first-degree heart block and low voltage  Assessment/Plan Acute blood loss anemia secondary to upper GI bleed: Patient presents after having 2 dark bowel movements this morning around 4:30 AM this morning.  Hemoglobin was down to 6.7 and stool guaiacs noted to be positive.  Suspect likely upper GI bleed giving elevated BUN to creatinine ratio. -Admit to medical telemetry bed -N.p.o. for likely need of scope -Continue with transfusion of 1 unit packed red blood cells -Monitor and transfuse blood products as needed for hemoglobins less than 8 -GI formally consulted with recommendation  Acute kidney injury superimposed on chronic kidney disease 3B: Patient presents with creatinine elevated up to 2.63 with BUN 72.  Baseline creatinine previously have been around 1.8 prior to discharge in September 2021.  Suspect likely related to upper GI bleed and/or congestive heart failure.  -Limiting nephrotoxic agents -Continue to monitor kidney function  Lower extremity edema and right sided pleural effusion diastolic congestive heart failure exacerbation: Patient presents with reports of worsening shortness of breath fatigue.  Physical exam revealed  least 2+ pitting bilateral lower extremity edema and decreased breath sounds on the right lung field.  Last echocardiogram revealed EF of 65-70% with grade 2 diastolic dysfunction in 1749.  Unclear at this time if symptoms may be related with worsening heart function, anemia, and/or hypoalbuminemia. -Strict INO's and daily weights -Add on BNP and TSH -Check chest x-ray(revealed relatively stable cardiomegaly with right-sided pleural effusion with pulmonary vascular congestion and possible bibasilar atelectasis) -Check echocardiogram -Give the patient 25 g of albumin followed by Lasix 40 mg IV x1 dose  -Reassess to determine if further IV diuresis warranted  SA node dysfunction: Patient  follows with Dr. Einar Gip and Dr. Curt Bears.  Patient was previously evaluated and noted to not be a candidate for pacemaker. -Follow-up telemetry overnight  Essential hypertension: Blood pressures currently stable.  Home blood pressure medications include hydralazine 50 mg 3 times daily, metoprolol 50 mg twice daily, nifedipine 60 mg daily, spironolactone 25 mg daily, isosorbide mononitrate 30 mg 3 times daily, and hydrochlorothiazide 25 mg daily. -Continue hydralazine, metoprolol, and nifedipine as tolerated -Held spironolactone, p.o. furosemide, hydralazine, and losartan  Hypoalbuminemia: Acute.  On admission Albumin 2.1. -Add-on prealbumin   History of CVA with residual deficit: Patient suffered a stroke back in 2015 and has some mild left facial and left upper extremity weakness.  BPH -Continue Flomax  OSA  -Continue CPAP nightly  DVT prophylaxis: SCD  Code Status: Wife change CODE STATUS back to full code  Family Communication: Wife updated Disposition Plan: Likely discharge home once medically stable Consults called: GI Admission status: Inpatient, patient require more than 2 midnight stay in the work-up for acute GI bleed and edema  Norval Morton MD [facility]   If 7PM-7AM, please  contact night-coverage   04/05/2020, 9:14 AM

## 2020-04-06 ENCOUNTER — Encounter (HOSPITAL_COMMUNITY): Payer: Self-pay | Admitting: Internal Medicine

## 2020-04-06 ENCOUNTER — Inpatient Hospital Stay (HOSPITAL_COMMUNITY): Payer: Medicare Other

## 2020-04-06 ENCOUNTER — Inpatient Hospital Stay (HOSPITAL_COMMUNITY): Payer: Medicare Other | Admitting: Certified Registered Nurse Anesthetist

## 2020-04-06 ENCOUNTER — Encounter (HOSPITAL_COMMUNITY)
Admission: EM | Disposition: A | Discharge status: Home / Self Care | Payer: Self-pay | Source: Home / Self Care | Attending: Internal Medicine

## 2020-04-06 ENCOUNTER — Other Ambulatory Visit (HOSPITAL_COMMUNITY): Payer: Medicare Other

## 2020-04-06 DIAGNOSIS — I5023 Acute on chronic systolic (congestive) heart failure: Secondary | ICD-10-CM

## 2020-04-06 DIAGNOSIS — D62 Acute posthemorrhagic anemia: Secondary | ICD-10-CM | POA: Diagnosis not present

## 2020-04-06 DIAGNOSIS — I5031 Acute diastolic (congestive) heart failure: Secondary | ICD-10-CM | POA: Diagnosis not present

## 2020-04-06 DIAGNOSIS — N179 Acute kidney failure, unspecified: Secondary | ICD-10-CM | POA: Diagnosis not present

## 2020-04-06 DIAGNOSIS — K922 Gastrointestinal hemorrhage, unspecified: Secondary | ICD-10-CM | POA: Diagnosis not present

## 2020-04-06 HISTORY — PX: BIOPSY: SHX5522

## 2020-04-06 HISTORY — PX: ESOPHAGOGASTRODUODENOSCOPY (EGD) WITH PROPOFOL: SHX5813

## 2020-04-06 LAB — CBC
HCT: 26.2 % — ABNORMAL LOW (ref 39.0–52.0)
HCT: 28.2 % — ABNORMAL LOW (ref 39.0–52.0)
Hemoglobin: 8.7 g/dL — ABNORMAL LOW (ref 13.0–17.0)
Hemoglobin: 9.2 g/dL — ABNORMAL LOW (ref 13.0–17.0)
MCH: 30.3 pg (ref 26.0–34.0)
MCH: 30.2 pg (ref 26.0–34.0)
MCHC: 33.2 g/dL (ref 30.0–36.0)
MCHC: 32.6 g/dL (ref 30.0–36.0)
MCV: 91.3 fL (ref 80.0–100.0)
MCV: 92.5 fL (ref 80.0–100.0)
Platelets: 272 10*3/uL (ref 150–400)
Platelets: 302 10*3/uL (ref 150–400)
RBC: 2.87 MIL/uL — ABNORMAL LOW (ref 4.22–5.81)
RBC: 3.05 MIL/uL — ABNORMAL LOW (ref 4.22–5.81)
RDW: 16.7 % — ABNORMAL HIGH (ref 11.5–15.5)
RDW: 16.7 % — ABNORMAL HIGH (ref 11.5–15.5)
WBC: 10.7 10*3/uL — ABNORMAL HIGH (ref 4.0–10.5)
WBC: 11.6 10*3/uL — ABNORMAL HIGH (ref 4.0–10.5)
nRBC: 0.6 % — ABNORMAL HIGH (ref 0.0–0.2)
nRBC: 0.6 % — ABNORMAL HIGH (ref 0.0–0.2)

## 2020-04-06 LAB — BPAM RBC
Blood Product Expiration Date: 202204032359
ISSUE DATE / TIME: 202203050913
Unit Type and Rh: 5100

## 2020-04-06 LAB — TYPE AND SCREEN
ABO/RH(D): O POS
Antibody Screen: NEGATIVE
Unit division: 0

## 2020-04-06 LAB — BASIC METABOLIC PANEL
Anion gap: 13 (ref 5–15)
BUN: 69 mg/dL — ABNORMAL HIGH (ref 8–23)
CO2: 17 mmol/L — ABNORMAL LOW (ref 22–32)
Calcium: 8.1 mg/dL — ABNORMAL LOW (ref 8.9–10.3)
Chloride: 110 mmol/L (ref 98–111)
Creatinine, Ser: 2.77 mg/dL — ABNORMAL HIGH (ref 0.61–1.24)
GFR, Estimated: 22 mL/min — ABNORMAL LOW (ref >60)
Glucose, Bld: 83 mg/dL (ref 70–99)
Potassium: 4.5 mmol/L (ref 3.5–5.1)
Sodium: 140 mmol/L (ref 135–145)

## 2020-04-06 LAB — HEMOGLOBIN AND HEMATOCRIT, BLOOD
HCT: 25.3 % — ABNORMAL LOW (ref 39.0–52.0)
Hemoglobin: 8.5 g/dL — ABNORMAL LOW (ref 13.0–17.0)

## 2020-04-06 LAB — ECHOCARDIOGRAM COMPLETE
Height: 65 in
P 1/2 time: 440 msec
S' Lateral: 3.6 cm
Weight: 2447.99 oz

## 2020-04-06 SURGERY — ESOPHAGOGASTRODUODENOSCOPY (EGD) WITH PROPOFOL
Anesthesia: Monitor Anesthesia Care

## 2020-04-06 MED ORDER — PANTOPRAZOLE SODIUM 40 MG PO TBEC
40.0000 mg | DELAYED_RELEASE_TABLET | Freq: Two times a day (BID) | ORAL | Status: DC
  Administered 2020-04-06 – 2020-04-18 (×24): 40 mg via ORAL
  Filled 2020-04-06 (×24): qty 1

## 2020-04-06 MED ORDER — IPRATROPIUM-ALBUTEROL 0.5-2.5 (3) MG/3ML IN SOLN
3.0000 mL | Freq: Four times a day (QID) | RESPIRATORY_TRACT | Status: DC | PRN
  Administered 2020-04-06 – 2020-04-07 (×2): 3 mL via RESPIRATORY_TRACT
  Filled 2020-04-06 (×2): qty 3

## 2020-04-06 MED ORDER — FUROSEMIDE 10 MG/ML IJ SOLN
40.0000 mg | Freq: Once | INTRAMUSCULAR | Status: AC
  Administered 2020-04-06: 40 mg via INTRAVENOUS
  Filled 2020-04-06: qty 4

## 2020-04-06 MED ORDER — TAMSULOSIN HCL 0.4 MG PO CAPS
0.4000 mg | ORAL_CAPSULE | Freq: Every day | ORAL | Status: DC
  Administered 2020-04-06 – 2020-04-17 (×12): 0.4 mg via ORAL
  Filled 2020-04-06 (×13): qty 1

## 2020-04-06 MED ORDER — PROPOFOL 500 MG/50ML IV EMUL
INTRAVENOUS | Status: DC | PRN
  Administered 2020-04-06: 100 ug/kg/min via INTRAVENOUS

## 2020-04-06 MED ORDER — PROPOFOL 10 MG/ML IV BOLUS
INTRAVENOUS | Status: DC | PRN
  Administered 2020-04-06: 20 mg via INTRAVENOUS

## 2020-04-06 MED ORDER — LATANOPROST 0.005 % OP SOLN
1.0000 [drp] | Freq: Every day | OPHTHALMIC | Status: DC
  Administered 2020-04-06 – 2020-04-17 (×12): 1 [drp] via OPHTHALMIC
  Filled 2020-04-06: qty 2.5

## 2020-04-06 MED ORDER — PHENYLEPHRINE 40 MCG/ML (10ML) SYRINGE FOR IV PUSH (FOR BLOOD PRESSURE SUPPORT)
PREFILLED_SYRINGE | INTRAVENOUS | Status: DC | PRN
  Administered 2020-04-06: 80 ug via INTRAVENOUS

## 2020-04-06 MED ORDER — SODIUM CHLORIDE 0.9 % IV SOLN: INTRAVENOUS | Status: DC

## 2020-04-06 MED ORDER — LORATADINE 10 MG PO TABS: 10.0000 mg | ORAL_TABLET | Freq: Every day | ORAL | Status: DC | PRN

## 2020-04-06 MED ORDER — SODIUM CHLORIDE 0.9 % IV SOLN
8.0000 mg/h | INTRAVENOUS | Status: AC
  Filled 2020-04-06: qty 80

## 2020-04-06 MED ORDER — SUCRALFATE 1 GM/10ML PO SUSP
1.0000 g | Freq: Three times a day (TID) | ORAL | Status: DC
  Administered 2020-04-06 – 2020-04-18 (×43): 1 g via ORAL
  Filled 2020-04-06 (×54): qty 10

## 2020-04-06 SURGICAL SUPPLY — 15 items

## 2020-04-06 NOTE — Progress Notes (Signed)
   04/06/20 2237  Assess: MEWS Score  Temp 98.3 F (36.8 C)  BP 135/70  Pulse Rate 96  Resp (!) 32  Level of Consciousness Alert  SpO2 94 %  O2 Device Nasal Cannula  O2 Flow Rate (L/min) 1 L/min  Assess: MEWS Score  MEWS Temp 0  MEWS Systolic 0  MEWS Pulse 0  MEWS RR 2  MEWS LOC 0  MEWS Score 2  MEWS Score Color Yellow  Assess: if the MEWS score is Yellow or Red  Were vital signs taken at a resting state? Yes  Focused Assessment Change from prior assessment (see assessment flowsheet)  Early Detection of Sepsis Score *See Row Information* Low  MEWS guidelines implemented *See Row Information* Yes  Treat  MEWS Interventions Escalated (See documentation below) (paged provider)  Pain Scale 0-10  Pain Score 0  Take Vital Signs  Increase Vital Sign Frequency  Yellow: Q 2hr X 2 then Q 4hr X 2, if remains yellow, continue Q 4hrs  Escalate  MEWS: Escalate Yellow: discuss with charge nurse/RN and consider discussing with provider and RRT  Notify: Charge Nurse/RN  Name of Charge Nurse/RN Notified Yrneh, RN  Date Charge Nurse/RN Notified 04/06/20  Time Charge Nurse/RN Notified 2238  Notify: Provider  Provider Name/Title Jeannette Corpus  Date Provider Notified 04/06/20  Time Provider Notified 2243  Notification Type Page  Notification Reason Change in status  Provider response See new orders  Date of Provider Response 04/06/20  Time of Provider Response 2247  Notify: Rapid Response  Name of Rapid Response RN Notified N/A  Document  Patient Outcome Other (Comment) (lasix, chest x-ray, Duo-neb)  Progress note created (see row info) Yes  Patient alert and oriented x4. Dyspneic, tachypneic and wheezing. On call provider notified, orders received for IV Lasix, Chest x-ray, and prn Duo-neb.

## 2020-04-06 NOTE — Progress Notes (Signed)
  Echocardiogram 2D Echocardiogram has been performed.  Randa Lynn Joee Iovine 04/06/2020, 2:28 PM

## 2020-04-06 NOTE — Plan of Care (Signed)

## 2020-04-06 NOTE — Progress Notes (Signed)
PROGRESS NOTE   Brian Wolf  IDP:[id number] DOB: [birth year] DOA: 04/05/2020 PCP: [name], DO   Chief Complaint  Patient presents with  . Rectal Bleeding   Level of care: Telemetry Medical  Brief Admission History:  y.o. male with medical history significant of HTN, CVA bradycardia 2/2 sinus node dysfunction, CKD stage IV, GI bleed with history of duodenal ulcer in 10/2019, and OSA on CPAP presents with complaints of having to dark stools, blood tinged stool water and generalized weakness.    Assessment & Plan:   Principal Problem:   Acute upper GI bleed Active Problems:   Essential hypertension   History of stroke   Sleep apnea   Acute kidney injury superimposed on chronic kidney disease (HCC)   Bilateral lower extremity edema   Acute blood loss anemia   Pleural effusion, right   Acute diastolic CHF (congestive heart failure) ([place])   1. Acute upper GI bleed - Pt was taken for endoscopy today by Dr. Cristina Gong with findings of a large duondenal ulcer which is thought source of bleed.  He remains on protonix now.  Sucralfate added by GI. Biopsy taken during endoscopy to be followed up by GI.   2. Stage IV CKD - family would like for him to establish care with Dr. Hollie Salk of [place].  Holding losartan for now given bump in creatinine and BUN.  3. LE edema - he was given IV lasix dose.  Repeat CXR in AM.  4. SA node dysfunction - He is followed by Dr. Einar Gip and Dr. Curt Bears.   5. Essential hypertension - he was restarted on metoprolol, nifedipine.  Holding home spironolactone, losartan and lasix due to AKI on CKD stage IV.  6. History of CVA - stable.  Had not been taking aspirin.  7. BPH - stable on flomax.  8. OSA - CPAP ordered.    DVT prophylaxis: SCD Code Status: Full  Family Communication: wife at bedside Disposition: Home  Status is: Inpatient  Remains inpatient appropriate because:IV treatments appropriate due to intensity of illness or inability to take PO and  Inpatient level of care appropriate due to severity of illness  Dispo: The patient is from: Home              Anticipated d/c is to: Home              Patient currently is not medically stable to d/c.   Difficult to place patient No   Consultants:   GI Sadie Haber)  Procedures:   EGD 04/06/20  Antimicrobials:     Subjective: Pt resting from procedure this morning.  He wants to eat now.  No specific complaints.    Objective: Vitals:   04/06/20 0749 04/06/20 0902 04/06/20 0917 04/06/20 0948  BP:  (!) 101/47 (!) 121/57 119/62  Pulse: 90 94 90 85  Resp: (!) 21 20 16    Temp: 97.9 F (36.6 C) (!) 97.1 F (36.2 C) (!) 97.5 F (36.4 C)   TempSrc: Temporal     SpO2: 92% 93% 96% 96%  Weight:      Height:        Intake/Output Summary (Last 24 hours) at 04/06/2020 1109 Last data filed at 04/06/2020 0855 Gross per 24 hour  Intake 100 ml  Output 1100 ml  Net -1000 ml   Filed Weights   04/05/20 0641  Weight: 69.4 kg    Examination:  General exam: frail, elderly male, awake, alert, somnolent, Appears calm and comfortable  Respiratory system:  Clear to auscultation. Respiratory effort normal. Cardiovascular system: normal S1 & S2 heard. No JVD, murmurs, rubs, gallops or clicks. No pedal edema. Gastrointestinal system: Abdomen is nondistended, soft and nontender. No organomegaly or masses felt. Normal bowel sounds heard. Central nervous system: Alert and oriented. No focal neurological deficits. Extremities: osteoarthritic changes.  Symmetric 5 x 5 power. Skin: No rashes, lesions or ulcers Psychiatry: Judgement and insight appear normal. Mood & affect appropriate.   Data Reviewed: I have personally reviewed following labs and imaging studies  CBC: Recent Labs  Lab 04/05/20 0729 04/05/20 1604 04/05/20 1847 04/06/20 0120 04/06/20 0642  WBC 10.4  --   --   --  10.7*  NEUTROABS 6.9  --   --   --   --   HGB 6.7* 9.0* 9.1* 8.5* 8.7*  HCT 21.3* 28.5* 28.2* 25.3* 26.2*  MCV 96.4   --   --   --  91.3  PLT 251  --   --   --  347    Basic Metabolic Panel: Recent Labs  Lab 04/05/20 0729 04/06/20 0642  NA 136 140  K 5.0 4.5  CL 107 110  CO2 19* 17*  GLUCOSE 105* 83  BUN 72* 69*  CREATININE 2.63* 2.77*  CALCIUM 7.9* 8.1*    GFR: Estimated Creatinine Clearance: 16.7 mL/min (A) (by C-G formula based on SCr of 2.77 mg/dL (H)).  Liver Function Tests: Recent Labs  Lab 04/05/20 0729  AST 22  ALT 14  ALKPHOS 41  BILITOT 0.7  PROT 4.8*  ALBUMIN 2.2*    CBG: No results for input(s): GLUCAP in the last 168 hours.  Recent Results (from the past 240 hour(s))  Resp Panel by RT-PCR (Flu A&B, Covid) Nasopharyngeal Swab     Status: None   Collection Time: 04/05/20  7:03 AM   Specimen: Nasopharyngeal Swab; Nasopharyngeal(NP) swabs in vial transport medium  Result Value Ref Range Status   SARS Coronavirus 2 by RT PCR NEGATIVE NEGATIVE Final    Comment: (NOTE) SARS-CoV-2 target nucleic acids are NOT DETECTED.  The SARS-CoV-2 RNA is generally detectable in upper respiratory specimens during the acute phase of infection. The lowest concentration of SARS-CoV-2 viral copies this assay can detect is 138 copies/mL. A negative result does not preclude SARS-Cov-2 infection and should not be used as the sole basis for treatment or other patient management decisions. A negative result may occur with  improper specimen collection/handling, submission of specimen other than nasopharyngeal swab, presence of viral mutation(s) within the areas targeted by this assay, and inadequate number of viral copies(<138 copies/mL). A negative result must be combined with clinical observations, patient history, and epidemiological information. The expected result is Negative.  Fact Sheet for Patients:  EntrepreneurPulse.com.au  Fact Sheet for Healthcare Providers:  IncredibleEmployment.be  This test is no t yet approved or cleared by the [place] FDA and  has been authorized for detection and/or diagnosis of SARS-CoV-2 by FDA under an Emergency Use Authorization (EUA). This EUA will remain  in effect (meaning this test can be used) for the duration of the COVID-19 declaration under Section 564(b)(1) of the Act, 21 U.S.C.section 360bbb-3(b)(1), unless the authorization is terminated  or revoked sooner.       Influenza A by PCR NEGATIVE NEGATIVE Final   Influenza B by PCR NEGATIVE NEGATIVE Final    Comment: (NOTE) The Xpert Xpress SARS-CoV-2/FLU/RSV plus assay is intended as an aid in the diagnosis of influenza from Nasopharyngeal swab specimens and should not  be used as a sole basis for treatment. Nasal washings and aspirates are unacceptable for Xpert Xpress SARS-CoV-2/FLU/RSV testing.  Fact Sheet for Patients: EntrepreneurPulse.com.au  Fact Sheet for Healthcare Providers: IncredibleEmployment.be  This test is not yet approved or cleared by the [place] FDA and has been authorized for detection and/or diagnosis of SARS-CoV-2 by FDA under an Emergency Use Authorization (EUA). This EUA will remain in effect (meaning this test can be used) for the duration of the COVID-19 declaration under Section 564(b)(1) of the Act, 21 U.S.C. section 360bbb-3(b)(1), unless the authorization is terminated or revoked.  Performed at [facility], [place] [address] [place], [place] [address]      Radiology Studies: DG CHEST PORT 1 VIEW  Result Date: 04/05/2020 CLINICAL DATA:  Shortness of breath EXAM: PORTABLE CHEST 1 VIEW COMPARISON:  03/11/2016 chest radiograph FINDINGS: Increased density overlying the RIGHT hemithorax likely represents a pleural effusion. Cardiomediastinal silhouette is unchanged with fullness in the UPPER RIGHT mediastinum. Pulmonary vascular congestion is noted. There may be bilateral LOWER lung opacities/atelectasis present. There is no evidence of pneumothorax.  Remote RIGHT rib fractures are present. IMPRESSION: 1. Increased density overlying the RIGHT hemithorax likely representing pleural effusion. 2. Pulmonary vascular congestion and possible bilateral LOWER lung opacities/atelectasis. Electronically Signed   By: Margarette Canada M.D.   On: 04/05/2020 11:29   Scheduled Meds: . hydrALAZINE  50 mg Oral TID  . isosorbide dinitrate  30 mg Oral TID  . latanoprost  1 drop Both Eyes QHS  . metoprolol tartrate  50 mg Oral BID  . NIFEdipine  60 mg Oral Daily  . pantoprazole  40 mg Oral BID AC  . simvastatin  40 mg Oral Daily  . sodium chloride flush  3 mL Intravenous Q12H  . sucralfate  1 g Oral TID WC & HS  . tamsulosin  0.4 mg Oral Daily   Continuous Infusions: . sodium chloride    . albumin human    . pantoprozole (PROTONIX) infusion      LOS: 1 day   Time spent: 36 mins   Clanford Wynetta Emery, MD How to contact the [facility] Attending or Consulting provider [place] or covering provider during after hours [place], for this patient?  1. Check the care team in [facility] and look for a) attending/consulting TRH provider listed and b) the [facility] team listed 2. Log into [email or web address] and use [place]'s universal password to access. If you do not have the password, please contact the hospital operator. 3. Locate the [facility] provider you are looking for under [facility] and page to a number that you can be directly reached. 4. If you still have difficulty reaching the provider, please page the [facility] (Director on Call) for the Hospitalists listed on amion for assistance.  04/06/2020, 11:09 AM

## 2020-04-06 NOTE — Progress Notes (Signed)
Patient tolerated upper endoscopy very well.  Patient has had a higher than expected rise in hemoglobin following transfusion of 1 unit of packed cells (6.7 -> 8.7) and is holding that level on repeated testing.  Renal function stable, BUN slightly improved.  Upper endoscopy today shows a clean-based duodenal ulcer which is the presumed source of the patient's recent bleeding.  There was no blood in the stomach.  Impression:  1.  Patient at low risk for further bleeding.  2.  Recurrent peptic ulcer disease in the apparent absence of Helicobacter pylori infection or ulcerogenic medication exposure.  Reason unclear, could have a hypersecretory condition.  Recommendations:  1.  I have advanced the patient's diet  2.  Would continue IV Protonix until the current bag is complete, then transition to oral Protonix  3.  If the patient remains stable overnight, which is likely, I think discharge from the GI tract standpoint would be acceptable tomorrow, although renal function issues, fluid retention, etc. may necessitate a longer stay.  4.  I have decreased the frequency of the patient's CBCs.  You may want to order continued basic metabolic panels in view of his renal dysfunction.  5.  I have added sucralfate to the patient's regimen temporarily.  I would only continue this while he is in-house.  For discharge, I think once daily pantoprazole 40 mg would be sufficient, but he will likely need to remain on that, or at least some type of PPI therapy, indefinitely since he presented with recurrent ulcer disease in the absence of ulcerogenic medication exposure.  To recall, he has not been using either aspirin or Protonix prior to admission, although they are listed on his prior-to-admission medication list.  6.  I will go over the patient's biopsy results with him when they are available (checking for Helicobacter pylori infection, for which previous outpatient serology was negative).  7.  We will  arrange outpatient follow-up with [name] and consider follow-up endoscopy to confirm ulcer healing.  Please call me if you have any questions.  Cleotis Nipper, M.D. Pager [phone] If no answer or after 5 PM call [phone]

## 2020-04-06 NOTE — Transfer of Care (Signed)
Immediate Anesthesia Transfer of Care Note  Patient: Brian Wolf  Procedure(s) Performed: ESOPHAGOGASTRODUODENOSCOPY (EGD) WITH PROPOFOL (N/A ) BIOPSY  Patient Location: PACU  Anesthesia Type:MAC  Level of Consciousness: drowsy  Airway & Oxygen Therapy: Patient Spontanous Breathing and Patient connected to nasal cannula oxygen  Post-op Assessment: Report given to RN and Post -op Vital signs reviewed and stable  Post vital signs: Reviewed and stable  Last Vitals:  Vitals Value Taken Time  BP 101/47 04/06/20 0902  Temp    Pulse 94 04/06/20 0902  Resp 28 04/06/20 0902  SpO2 90 % 04/06/20 0902  Vitals shown include unvalidated device data.  Last Pain:  Vitals:   04/06/20 0749  TempSrc: Temporal  PainSc:          Complications: No complications documented.

## 2020-04-06 NOTE — Interval H&P Note (Signed)
History and Physical Interval Note:  04/06/2020 8:36 AM  Brian Wolf  has presented today for surgery, with the diagnosis of melena and anemia.  The various methods of treatment have been discussed with the patient and family. After consideration of risks, benefits and other options for treatment, the patient has consented to  Procedure(s): ESOPHAGOGASTRODUODENOSCOPY (EGD) WITH PROPOFOL (N/A) as a surgical intervention.  The patient's history has been reviewed, patient examined, no change in status, stable for surgery.  I have reviewed the patient's chart and labs.  Questions were answered to the patient's satisfaction.     Youlanda Mighty Jeneal Vogl

## 2020-04-06 NOTE — Op Note (Signed)
[facility] Patient Name: Brian Wolf Procedure Date : 04/06/2020 MRN: [id number] Attending MD: Ronald Lobo , MD Date of Birth: 06-Oct-1931 CSN: [id number] Age: 87 Admit Type: Inpatient Procedure:                Upper GI endoscopy Indications:              Acute post hemorrhagic anemia, Melena, Personal                            history of peptic ulcer disease 10/2019 Providers:                Ronald Lobo, MD, Grace Isaac, RN, Benetta Spar, Technician Referring MD:              Medicines:                Monitored Anesthesia Care Complications:            No immediate complications. Estimated Blood Loss:     Estimated blood loss was minimal. Procedure:                Pre-Anesthesia Assessment:                           - Prior to the procedure, a History and Physical                            was performed, and patient medications and                            allergies were reviewed. The patient's tolerance of                            previous anesthesia was also reviewed. The risks                            and benefits of the procedure and the sedation                            options and risks were discussed with the patient.                            All questions were answered, and informed consent                            was obtained. Prior Anticoagulants: The patient has                            taken no previous anticoagulant or antiplatelet                            agents. ASA Grade Assessment: III - A patient with  severe systemic disease. After reviewing the risks                            and benefits, the patient was deemed in                            satisfactory condition to undergo the procedure.                           After obtaining informed consent, the endoscope was                            passed under direct vision. Throughout the                             procedure, the patient's blood pressure, pulse, and                            oxygen saturations were monitored continuously. The                            GIF-H190 ([id number]) Olympus gastroscope was                            introduced through the mouth, and advanced to the                            second part of duodenum. The upper GI endoscopy was                            accomplished without difficulty. The patient                            tolerated the procedure well. Scope In: Scope Out: Findings:      The examined esophagus was normal.      The entire examined stomach was normal.      The cardia and gastric fundus were normal on retroflexion.      One non-bleeding cratered duodenal ulcer with no stigmata of bleeding       was found in the second portion of the duodenum. The lesion was 10 mm in       largest dimension. There was some edema of the second portion of the       duodenum, but no other ulcers were seen, including careful inspection of       the bulb.      Biopsies were taken from the gastric antrum with a cold forceps for       histology to check for H. pylori infection, although IgG antibodies were       negative last 22-Nov-2022 when checked in the office. Impression:               - Normal esophagus.                           - Normal stomach.                           -  Non-bleeding duodenal ulcer with no stigmata of                            bleeding. Biopsied. Moderate Sedation:      This patient was sedated with monitored anesthesia care, not moderate       sedation. Recommendation:           - Await pathology results.                           - Use Protonix (pantoprazole) 40 mg PO daily                            indefinitely. Procedure Code(s):        --- Professional ---                           [id number], Esophagogastroduodenoscopy, flexible,                            transoral; with biopsy, single or multiple Diagnosis Code(s):        ---  Professional ---                           K26.9, Duodenal ulcer, unspecified as acute or                            chronic, without hemorrhage or perforation                           D62, Acute posthemorrhagic anemia                           K92.1, Melena (includes Hematochezia)                           Z87.11, Personal history of peptic ulcer disease CPT copyright 2019 [employer]. All rights reserved. The codes documented in this report are preliminary and upon coder review may  be revised to meet current compliance requirements. Ronald Lobo, MD 04/06/2020 9:01:32 AM This report has been signed electronically. Number of Addenda: 0

## 2020-04-06 NOTE — Anesthesia Postprocedure Evaluation (Signed)
Anesthesia Post Note  Patient: Brian Wolf  Procedure(s) Performed: ESOPHAGOGASTRODUODENOSCOPY (EGD) WITH PROPOFOL (N/A ) BIOPSY     Patient location during evaluation: PACU Anesthesia Type: MAC Level of consciousness: awake and alert, patient cooperative and oriented Pain management: pain level controlled Vital Signs Assessment: post-procedure vital signs reviewed and stable Respiratory status: spontaneous breathing, nonlabored ventilation and respiratory function stable Cardiovascular status: blood pressure returned to baseline and stable Postop Assessment: no apparent nausea or vomiting Anesthetic complications: no   No complications documented.  Last Vitals:  Vitals:   04/06/20 0917 04/06/20 0948  BP: (!) 121/57 119/62  Pulse: 90 85  Resp: 16   Temp: (!) 36.4 C   SpO2: 96% 96%    Last Pain:  Vitals:   04/06/20 0917  TempSrc:   PainSc: 0-No pain                 Hodaya Curto,E. Aithana Kushner

## 2020-04-06 NOTE — Anesthesia Preprocedure Evaluation (Addendum)
Anesthesia Evaluation  Patient identified by MRN, date of birth, ID band Patient awake    Reviewed: Allergy & Precautions, NPO status , Patient's Chart, lab work & pertinent test results, reviewed documented beta blocker date and time   History of Anesthesia Complications Negative for: history of anesthetic complications  Airway Mallampati: IV  TM Distance: >3 FB Neck ROM: Full    Dental  (+) Chipped, Poor Dentition, Dental Advisory Given   Pulmonary sleep apnea and Continuous Positive Airway Pressure Ventilation ,  04/05/2020 SARS coronavirus NEG   breath sounds clear to auscultation       Cardiovascular hypertension, Pt. on medications and Pt. on home beta blockers (-) angina+CHF (Lower extremity edema and right sided pleural effusion diastolic congestive heart failure exacerbation)   Rhythm:Regular Rate:Normal  '18 ECHO: EF 65-70%, Grade 1 DD, no significant valvular abnormalities   Neuro/Psych PSYCHIATRIC DISORDERS (mild dementia, pt is very clear today) Dementia H/o SAH CVA (L arm weakness), Residual Symptoms    GI/Hepatic Neg liver ROS, GERD  Controlled,  Endo/Other  negative endocrine ROS  Renal/GU Renal InsufficiencyRenal disease (creat 2.63)     Musculoskeletal  (+) Arthritis ,   Abdominal   Peds  Hematology  (+) Blood dyscrasia (Hb 8.7), anemia ,   Anesthesia Other Findings   Reproductive/Obstetrics                            Anesthesia Physical Anesthesia Plan  ASA: III  Anesthesia Plan: MAC   Post-op Pain Management:    Induction:   PONV Risk Score and Plan: 1 and Ondansetron and Treatment may vary due to age or medical condition  Airway Management Planned: Natural Airway and Nasal Cannula  Additional Equipment: None  Intra-op Plan:   Post-operative Plan:   Informed Consent: I have reviewed the patients History and Physical, chart, labs and discussed the procedure  including the risks, benefits and alternatives for the proposed anesthesia with the patient or authorized representative who has indicated his/her understanding and acceptance.     Dental advisory given and Consent reviewed with POA  Plan Discussed with: CRNA and Surgeon  Anesthesia Plan Comments:        Anesthesia Quick Evaluation

## 2020-04-07 ENCOUNTER — Inpatient Hospital Stay (HOSPITAL_COMMUNITY): Payer: Medicare Other

## 2020-04-07 ENCOUNTER — Encounter (HOSPITAL_COMMUNITY): Payer: Self-pay | Admitting: Internal Medicine

## 2020-04-07 ENCOUNTER — Other Ambulatory Visit: Payer: Self-pay

## 2020-04-07 DIAGNOSIS — N179 Acute kidney failure, unspecified: Secondary | ICD-10-CM | POA: Diagnosis not present

## 2020-04-07 DIAGNOSIS — R0602 Shortness of breath: Secondary | ICD-10-CM

## 2020-04-07 DIAGNOSIS — K922 Gastrointestinal hemorrhage, unspecified: Secondary | ICD-10-CM | POA: Diagnosis not present

## 2020-04-07 DIAGNOSIS — I5031 Acute diastolic (congestive) heart failure: Secondary | ICD-10-CM | POA: Diagnosis not present

## 2020-04-07 DIAGNOSIS — D62 Acute posthemorrhagic anemia: Secondary | ICD-10-CM | POA: Diagnosis not present

## 2020-04-07 LAB — RENAL FUNCTION PANEL
Albumin: 2.3 g/dL — ABNORMAL LOW (ref 3.5–5.0)
Anion gap: 10 (ref 5–15)
BUN: 65 mg/dL — ABNORMAL HIGH (ref 8–23)
CO2: 20 mmol/L — ABNORMAL LOW (ref 22–32)
Calcium: 8.1 mg/dL — ABNORMAL LOW (ref 8.9–10.3)
Chloride: 109 mmol/L (ref 98–111)
Creatinine, Ser: 2.96 mg/dL — ABNORMAL HIGH (ref 0.61–1.24)
GFR, Estimated: 20 mL/min — ABNORMAL LOW (ref >60)
Glucose, Bld: 122 mg/dL — ABNORMAL HIGH (ref 70–99)
Phosphorus: 5.1 mg/dL — ABNORMAL HIGH (ref 2.5–4.6)
Potassium: 4.3 mmol/L (ref 3.5–5.1)
Sodium: 139 mmol/L (ref 135–145)

## 2020-04-07 LAB — CBC
HCT: 26.2 % — ABNORMAL LOW (ref 39.0–52.0)
Hemoglobin: 8.4 g/dL — ABNORMAL LOW (ref 13.0–17.0)
MCH: 30 pg (ref 26.0–34.0)
MCHC: 32.1 g/dL (ref 30.0–36.0)
MCV: 93.6 fL (ref 80.0–100.0)
Platelets: 300 10*3/uL (ref 150–400)
RBC: 2.8 MIL/uL — ABNORMAL LOW (ref 4.22–5.81)
RDW: 16.6 % — ABNORMAL HIGH (ref 11.5–15.5)
WBC: 11 10*3/uL — ABNORMAL HIGH (ref 4.0–10.5)
nRBC: 0.5 % — ABNORMAL HIGH (ref 0.0–0.2)

## 2020-04-07 MED ORDER — METHYLPREDNISOLONE SODIUM SUCC 40 MG IJ SOLR
40.0000 mg | Freq: Four times a day (QID) | INTRAMUSCULAR | Status: AC
  Administered 2020-04-07 – 2020-04-08 (×3): 40 mg via INTRAVENOUS
  Filled 2020-04-07 (×3): qty 1

## 2020-04-07 MED ORDER — MAGNESIUM SULFATE 2 GM/50ML IV SOLN
2.0000 g | Freq: Once | INTRAVENOUS | Status: AC
  Administered 2020-04-07: 2 g via INTRAVENOUS
  Filled 2020-04-07: qty 50

## 2020-04-07 MED ORDER — IPRATROPIUM-ALBUTEROL 0.5-2.5 (3) MG/3ML IN SOLN
3.0000 mL | Freq: Four times a day (QID) | RESPIRATORY_TRACT | Status: AC
  Administered 2020-04-07 – 2020-04-08 (×3): 3 mL via RESPIRATORY_TRACT
  Filled 2020-04-07 (×3): qty 3

## 2020-04-07 MED ORDER — FUROSEMIDE 40 MG PO TABS
40.0000 mg | ORAL_TABLET | Freq: Every day | ORAL | Status: DC
  Administered 2020-04-07: 40 mg via ORAL
  Filled 2020-04-07: qty 1

## 2020-04-07 MED ORDER — FUROSEMIDE 10 MG/ML IJ SOLN
40.0000 mg | Freq: Every day | INTRAMUSCULAR | Status: DC
  Administered 2020-04-07: 40 mg via INTRAMUSCULAR
  Filled 2020-04-07: qty 4

## 2020-04-07 MED ORDER — FUROSEMIDE 20 MG PO TABS: 20.0000 mg | ORAL_TABLET | Freq: Every day | ORAL | Status: DC

## 2020-04-07 MED ORDER — ORAL CARE MOUTH RINSE
15.0000 mL | Freq: Two times a day (BID) | OROMUCOSAL | Status: DC
  Administered 2020-04-09 – 2020-04-18 (×17): 15 mL via OROMUCOSAL

## 2020-04-07 NOTE — Evaluation (Signed)
Physical Therapy Evaluation Patient Details Name: Brian Wolf MRN: [id number] DOB: [birth year] Today's Date: 04/07/2020   History of Present Illness  Pt is an 87 y/o male admitted 3/5 secondary to dark stools; thought to be secondary to GI bleed. Pt is s/p EGD on 3/6 that showed large duodenal ulcer. Since admission, pt has developed wheezing as well; workup pending. PMH includes HTN, CVA, CKD, and OSA on CPAP.  Clinical Impression  Pt admitted secondary to problem above with deficits below. Pt requiring min to min guard A for bed mobility tasks. Increased SOB and wheezing upon sitting, and pt requesting to return to supine, so further mobility deferred. Was able to perform lateral scoots with supervision for repositioning in bed. Anticipate pt will progress well once breathing improved. Will continue to follow acutely.     Follow Up Recommendations Home health PT;Supervision for mobility/OOB (pending progression)    Equipment Recommendations  Other (comment) (rollator (4 wheeled walker with seat))    Recommendations for Other Services OT consult     Precautions / Restrictions Precautions Precautions: Fall Restrictions Weight Bearing Restrictions: No      Mobility  Bed Mobility Overal bed mobility: Needs Assistance Bed Mobility: Supine to Sit;Sit to Supine     Supine to sit: Min assist Sit to supine: Min guard   General bed mobility comments: Min A for trunk elevation to come to sitting. Increased time required. Noted wheezing in sitting and pt requesting to return to supine. Further mobility deferred.    Transfers                    Ambulation/Gait                Stairs            Wheelchair Mobility    Modified Rankin (Stroke Patients Only)       Balance Overall balance assessment: Needs assistance Sitting-balance support: No upper extremity supported;Feet supported Sitting balance-[name]: Fair                                        Pertinent Vitals/Pain Pain Assessment: No/denies pain    Home Living Family/patient expects to be discharged to:: Private residence Living Arrangements: Spouse/significant other Available Help at Discharge: Family;Available 24 hours/day Type of Home: House Home Access: Ramped entrance     Home Layout: One level Home Equipment: Shower seat;Grab bars - tub/shower;Tub bench;Walker - 2 wheels;Cane - single point      Prior Function Level of Independence: Independent               Hand Dominance        Extremity/Trunk Assessment   Upper Extremity Assessment Upper Extremity Assessment: Defer to OT evaluation    Lower Extremity Assessment Lower Extremity Assessment: Generalized weakness    Cervical / Trunk Assessment Cervical / Trunk Assessment: Kyphotic  Communication   Communication: No difficulties  Cognition Arousal/Alertness: Awake/alert Behavior During Therapy: WFL for tasks assessed/performed Overall Cognitive Status: History of cognitive impairments - at baseline                                        General Comments General comments (skin integrity, edema, etc.): Pt's wife present during session    Exercises     Assessment/Plan  PT Assessment Patient needs continued PT services  PT Problem List Decreased strength;Decreased balance;Decreased mobility;Decreased activity tolerance;Decreased cognition;Decreased knowledge of use of DME;Decreased safety awareness;Decreased knowledge of precautions;Cardiopulmonary status limiting activity       PT Treatment Interventions DME instruction;Gait training;Functional mobility training;Therapeutic activities;Therapeutic exercise;Balance training;Patient/family education    PT Goals (Current goals can be found in the Care Plan section)  Acute Rehab PT Goals Patient Stated Goal: to go home PT Goal Formulation: With patient Time For Goal Achievement: 04/21/20 Potential to  Achieve Goals: Good    Frequency Min 3X/week   Barriers to discharge        Co-evaluation               AM-PAC PT "6 Clicks" Mobility  Outcome Measure Help needed turning from your back to your side while in a flat bed without using bedrails?: A Little Help needed moving from lying on your back to sitting on the side of a flat bed without using bedrails?: A Little Help needed moving to and from a bed to a chair (including a wheelchair)?: A Little Help needed standing up from a chair using your arms (e.g., wheelchair or bedside chair)?: A Little Help needed to walk in hospital room?: A Little Help needed climbing 3-5 steps with a railing? : A Little 6 Click Score: 18    End of Session Equipment Utilized During Treatment: Oxygen Activity Tolerance: Treatment limited secondary to medical complications (Comment) (SOB, wheezing) Patient left: in bed;with call bell/phone within reach;with bed alarm set;with family/visitor present Nurse Communication: Mobility status PT Visit Diagnosis: Other abnormalities of gait and mobility (R26.89);Difficulty in walking, not elsewhere classified (R26.2);Muscle weakness (generalized) (M62.81)    Time: 8938-1017 PT Time Calculation (min) (ACUTE ONLY): 19 min   Charges:   PT Evaluation $PT Eval Moderate Complexity: 1 Mod          Brian Wolf, PT, DPT  Acute Rehabilitation Services  Pager: [phone] Office: [phone]   Brian Wolf 04/07/2020, 9:50 AM

## 2020-04-07 NOTE — Progress Notes (Signed)
PROGRESS NOTE   Brian Wolf  QAS:[id number] DOB: [birth year] DOA: 04/05/2020 PCP: [name], DO   Chief Complaint  Patient presents with  . Rectal Bleeding   Level of care: Telemetry Medical  Brief Admission History:  y.o. male with medical history significant of HTN, CVA bradycardia 2/2 sinus node dysfunction, CKD stage IV, GI bleed with history of duodenal ulcer in 10/2019, and OSA on CPAP presents with complaints of having to dark stools, blood tinged stool water and generalized weakness.    Assessment & Plan:   Principal Problem:   Acute upper GI bleed Active Problems:   Essential hypertension   History of stroke   Sleep apnea   Acute kidney injury superimposed on chronic kidney disease (HCC)   Bilateral lower extremity edema   Acute blood loss anemia   Pleural effusion, right   Acute diastolic CHF (congestive heart failure) ([place])   1. Acute upper GI bleed - Pt was taken for endoscopy by Dr. Cristina Gong with findings of a large duondenal ulcer which is thought source of bleed.  He remains on protonix now.  Sucralfate added by GI. Biopsy taken during endoscopy to be followed up by GI.   2. Stage IV CKD - family would like for him to establish care with Dr. Hollie Salk of [place].  Holding losartan for now given bump in creatinine and BUN.  Likely due to poor perfusion as a result of newly found cardiomyopathy.  3. Cardiomyopathy - newly found drop in EF to 35-40% from 2018 65-70%.   I have reached out to his cardiologist Dr. Einar Gip and requested inpatient consultation.  4. LE edema - he was given IV lasix dose but no good diuretic response.  Repeat CXR stable.  5. SA node dysfunction - He is followed by Dr. Einar Gip and Dr. Curt Bears.   6. Essential hypertension - he was restarted on metoprolol, nifedipine, isosorbide, hydralazine.  Holding home spironolactone, losartan and due to AKI on CKD stage IV.  7. History of CVA - stable.  Had not been taking aspirin per GI.  8. BPH - stable on  flomax.  9. OSA - CPAP ordered.    DVT prophylaxis: SCD Code Status: Full  Family Communication: wife at bedside Disposition: Home  Status is: Inpatient  Remains inpatient appropriate because:IV treatments appropriate due to intensity of illness or inability to take PO and Inpatient level of care appropriate due to severity of illness  Dispo: The patient is from: Home              Anticipated d/c is to: Home              Patient currently is not medically stable to d/c.   Difficult to place patient No   Consultants:   GI Sadie Haber)  Procedures:   EGD 04/06/20  Antimicrobials:     Subjective: Pt very weak.  Having wheezing in chest.  No chest pain.     Objective: Vitals:   04/07/20 0611 04/07/20 0803 04/07/20 0914 04/07/20 1030  BP: 137/65 (!) 131/56  (!) 142/65  Pulse: 68 66  73  Resp: (!) 28 20  (!) 22  Temp: 97.7 F (36.5 C) 97.7 F (36.5 C)  98.4 F (36.9 C)  TempSrc: Oral Oral  Oral  SpO2: 97% 96% 98% 98%  Weight:      Height:        Intake/Output Summary (Last 24 hours) at 04/07/2020 1429 Last data filed at 04/07/2020 1243 Gross per 24 hour  Intake 403 ml  Output 600 ml  Net -197 ml   Filed Weights   04/05/20 0641  Weight: 69.4 kg    Examination:  General exam: frail, elderly male, awake, alert, somnolent, Appears calm and comfortable  Respiratory system: anterior expiratory wheezing heard.  Respiratory effort normal. Cardiovascular system: normal S1 & S2 heard. No JVD, murmurs, rubs, gallops or clicks. No pedal edema. Gastrointestinal system: Abdomen is nondistended, soft and nontender. No organomegaly or masses felt. Normal bowel sounds heard. Central nervous system: Alert and oriented. No focal neurological deficits. Extremities: osteoarthritic changes.  Symmetric 5 x 5 power. Skin: No rashes, lesions or ulcers Psychiatry: Judgement and insight appear normal. Mood & affect appropriate.   Data Reviewed: I have personally reviewed following labs and  imaging studies  CBC: Recent Labs  Lab 04/05/20 0729 04/05/20 1604 04/05/20 1847 04/06/20 0120 04/06/20 0642 04/06/20 1610 04/07/20 0252  WBC 10.4  --   --   --  10.7* 11.6* 11.0*  NEUTROABS 6.9  --   --   --   --   --   --   HGB 6.7*   < > 9.1* 8.5* 8.7* 9.2* 8.4*  HCT 21.3*   < > 28.2* 25.3* 26.2* 28.2* 26.2*  MCV 96.4  --   --   --  91.3 92.5 93.6  PLT 251  --   --   --  272 302 300   < > = values in this interval not displayed.    Basic Metabolic Panel: Recent Labs  Lab 04/05/20 0729 04/06/20 0642 04/07/20 0252  NA 136 140 139  K 5.0 4.5 4.3  CL 107 110 109  CO2 19* 17* 20*  GLUCOSE 105* 83 122*  BUN 72* 69* 65*  CREATININE 2.63* 2.77* 2.96*  CALCIUM 7.9* 8.1* 8.1*  PHOS  --   --  5.1*    GFR: Estimated Creatinine Clearance: 15.6 mL/min (A) (by C-G formula based on SCr of 2.96 mg/dL (H)).  Liver Function Tests: Recent Labs  Lab 04/05/20 0729 04/07/20 0252  AST 22  --   ALT 14  --   ALKPHOS 41  --   BILITOT 0.7  --   PROT 4.8*  --   ALBUMIN 2.2* 2.3*    CBG: No results for input(s): GLUCAP in the last 168 hours.  Recent Results (from the past 240 hour(s))  Resp Panel by RT-PCR (Flu A&B, Covid) Nasopharyngeal Swab     Status: None   Collection Time: 04/05/20  7:03 AM   Specimen: Nasopharyngeal Swab; Nasopharyngeal(NP) swabs in vial transport medium  Result Value Ref Range Status   SARS Coronavirus 2 by RT PCR NEGATIVE NEGATIVE Final    Comment: (NOTE) SARS-CoV-2 target nucleic acids are NOT DETECTED.  The SARS-CoV-2 RNA is generally detectable in upper respiratory specimens during the acute phase of infection. The lowest concentration of SARS-CoV-2 viral copies this assay can detect is 138 copies/mL. A negative result does not preclude SARS-Cov-2 infection and should not be used as the sole basis for treatment or other patient management decisions. A negative result may occur with  improper specimen collection/handling, submission of specimen  other than nasopharyngeal swab, presence of viral mutation(s) within the areas targeted by this assay, and inadequate number of viral copies(<138 copies/mL). A negative result must be combined with clinical observations, patient history, and epidemiological information. The expected result is Negative.  Fact Sheet for Patients:  EntrepreneurPulse.com.au  Fact Sheet for Healthcare Providers:  IncredibleEmployment.be  This test  is no t yet approved or cleared by the [place] and  has been authorized for detection and/or diagnosis of SARS-CoV-2 by FDA under an Emergency Use Authorization (EUA). This EUA will remain  in effect (meaning this test can be used) for the duration of the COVID-19 declaration under Section 564(b)(1) of the Act, 21 U.S.C.section 360bbb-3(b)(1), unless the authorization is terminated  or revoked sooner.       Influenza A by PCR NEGATIVE NEGATIVE Final   Influenza B by PCR NEGATIVE NEGATIVE Final    Comment: (NOTE) The Xpert Xpress SARS-CoV-2/FLU/RSV plus assay is intended as an aid in the diagnosis of influenza from Nasopharyngeal swab specimens and should not be used as a sole basis for treatment. Nasal washings and aspirates are unacceptable for Xpert Xpress SARS-CoV-2/FLU/RSV testing.  Fact Sheet for Patients: EntrepreneurPulse.com.au  Fact Sheet for Healthcare Providers: IncredibleEmployment.be  This test is not yet approved or cleared by the [place] FDA and has been authorized for detection and/or diagnosis of SARS-CoV-2 by FDA under an Emergency Use Authorization (EUA). This EUA will remain in effect (meaning this test can be used) for the duration of the COVID-19 declaration under Section 564(b)(1) of the Act, 21 U.S.C. section 360bbb-3(b)(1), unless the authorization is terminated or revoked.  Performed at [facility], [place] [address], [place],  [place] [address]      Radiology Studies: DG CHEST PORT 1 VIEW  Result Date: 04/06/2020 CLINICAL DATA:  Dyspnea EXAM: PORTABLE CHEST 1 VIEW COMPARISON:  04/05/2020 FINDINGS: Large right pleural effusion is unchanged with compressive atelectasis of the right lung base and opacification of the right hemithorax. Small left pleural effusion is present with persistent retrocardiac atelectasis. Right paratracheal opacity is unchanged from multiple prior examinations likely representing vascular shadow. Cardiac size within normal limits. The pulmonary vascularity is normal. No acute bone abnormality. Multiple healed right rib fractures are noted. IMPRESSION: Stable large right and small left pleural effusions with associated bibasilar compressive atelectasis. Electronically Signed   By: Fidela Salisbury MD   On: 04/06/2020 23:04   ECHOCARDIOGRAM COMPLETE  Result Date: 04/06/2020    ECHOCARDIOGRAM REPORT   Patient Name:   ALSTON BERRIE Crosland Date of Exam: 04/06/2020 Medical Rec #:  [id number]         Height:       65.0 in Accession #:    [id number]        Weight:       153.0 lb Date of Birth:  1931-11-25         BSA:          1.765 m Patient Age:    87 years          BP:           119/62 mmHg Patient Gender: M                 HR:           84 bpm. Exam Location:  Inpatient Procedure: 2D Echo, Cardiac Doppler and Color Doppler Indications:    I50.23 Acute on chronic systolic (congestive) heart failure  History:        Patient has prior history of Echocardiogram examinations, most                 recent 03/09/2016. Stroke; Risk Factors:Hypertension.  Sonographer:    Jonelle Sidle Dance Referring Phys: [place]  1. Left ventricular ejection fraction, by estimation, is 35 to 40%. The left  ventricle has moderately decreased function. The left ventricle has no regional wall motion abnormalities. Left ventricular diastolic parameters are consistent with Grade I diastolic dysfunction (impaired relaxation).  2. Right  ventricular systolic function is normal. The right ventricular size is normal. There is normal pulmonary artery systolic pressure.  3. The mitral valve is normal in structure. No evidence of mitral valve regurgitation. No evidence of mitral stenosis.  4. The aortic valve is tricuspid. Aortic valve regurgitation is mild. Mild to moderate aortic valve sclerosis/calcification is present, without any evidence of aortic stenosis.  5. The inferior vena cava is normal in size with greater than 50% respiratory variability, suggesting right atrial pressure of 3 mmHg. FINDINGS  Left Ventricle: Left ventricular ejection fraction, by estimation, is 35 to 40%. The left ventricle has moderately decreased function. The left ventricle has no regional wall motion abnormalities. The left ventricular internal cavity size was normal in size. There is no left ventricular hypertrophy. Left ventricular diastolic parameters are consistent with Grade I diastolic dysfunction (impaired relaxation).  LV Wall Scoring: The mid and distal anterior septum, apical lateral segment, mid inferoseptal segment, and apex are akinetic. Right Ventricle: The right ventricular size is normal. No increase in right ventricular wall thickness. Right ventricular systolic function is normal. There is normal pulmonary artery systolic pressure. The tricuspid regurgitant velocity is 2.42 m/s, and  with an assumed right atrial pressure of 3 mmHg, the estimated right ventricular systolic pressure is 76.1 mmHg. Left Atrium: Left atrial size was normal in size. Right Atrium: Right atrial size was normal in size. Pericardium: There is no evidence of pericardial effusion. Mitral Valve: The mitral valve is normal in structure. No evidence of mitral valve regurgitation. No evidence of mitral valve stenosis. Tricuspid Valve: The tricuspid valve is normal in structure. Tricuspid valve regurgitation is not demonstrated. No evidence of tricuspid stenosis. Aortic Valve: The  aortic valve is tricuspid. Aortic valve regurgitation is mild. Aortic regurgitation PHT measures 440 msec. Mild to moderate aortic valve sclerosis/calcification is present, without any evidence of aortic stenosis. Pulmonic Valve: The pulmonic valve was normal in structure. Pulmonic valve regurgitation is not visualized. No evidence of pulmonic stenosis. Aorta: The aortic root is normal in size and structure. Venous: The inferior vena cava is normal in size with greater than 50% respiratory variability, suggesting right atrial pressure of 3 mmHg. IAS/Shunts: No atrial level shunt detected by color flow Doppler.  LEFT VENTRICLE PLAX 2D LVIDd:         4.50 cm LVIDs:         3.60 cm LV PW:         1.10 cm LV IVS:        1.10 cm LVOT diam:     2.10 cm LV SV:         72 LV SV Index:   41 LVOT Area:     3.46 cm  RIGHT VENTRICLE          IVC RV Basal diam:  2.80 cm  IVC diam: 1.40 cm TAPSE (M-mode): 1.6 cm LEFT ATRIUM             Index       RIGHT ATRIUM           Index LA diam:        4.50 cm 2.55 cm/m  RA Area:     11.50 cm LA Vol (A2C):   80.8 ml 45.77 ml/m RA Volume:   26.90 ml  15.24 ml/m LA  Vol (A4C):   86.1 ml 48.77 ml/m LA Biplane Vol: 83.4 ml 47.24 ml/m  AORTIC VALVE LVOT Vmax:   95.00 cm/s LVOT Vmean:  60.300 cm/s LVOT VTI:    0.209 m AI PHT:      440 msec  AORTA Ao Root diam: 3.30 cm Ao Asc diam:  3.50 cm MV A velocity: 128.00 cm/s  TRICUSPID VALVE                             TR Peak grad:   23.4 mmHg                             TR Vmax:        242.00 cm/s                              SHUNTS                             Systemic VTI:  0.21 m                             Systemic Diam: 2.10 cm Candee Furbish MD Electronically signed by Candee Furbish MD Signature Date/Time: 04/06/2020/3:01:25 PM    Final    Scheduled Meds: . furosemide  40 mg Oral Daily  . hydrALAZINE  50 mg Oral TID  . ipratropium-albuterol  3 mL Nebulization Q6H  . isosorbide dinitrate  30 mg Oral TID  . latanoprost  1 drop Both Eyes QHS  .  [START ON 04/08/2020] mouth rinse  15 mL Mouth Rinse BID  . metoprolol tartrate  50 mg Oral BID  . NIFEdipine  60 mg Oral Daily  . pantoprazole  40 mg Oral BID AC  . simvastatin  40 mg Oral Daily  . sodium chloride flush  3 mL Intravenous Q12H  . sucralfate  1 g Oral TID WC & HS  . tamsulosin  0.4 mg Oral Daily   Continuous Infusions: . sodium chloride      LOS: 2 days   Time spent: 42 mins   Claudean Leavelle Wynetta Emery, MD How to contact the [facility] Attending or Consulting provider [place] or covering provider during after hours [place], for this patient?  1. Check the care team in [facility] and look for a) attending/consulting [facility] provider listed and b) the [facility] team listed 2. Log into [email or web address] and use [facility]'s universal password to access. If you do not have the password, please contact the hospital operator. 3. Locate the [facility] provider you are looking for under [facility] and page to a number that you can be directly reached. 4. If you still have difficulty reaching the provider, please page the [facility] (Director on Call) for the Hospitalists listed on amion for assistance.  04/07/2020, 2:29 PM

## 2020-04-07 NOTE — Plan of Care (Signed)
  Problem: Education: Goal: Knowledge of General Education information will improve Description: Including pain rating scale, medication(s)/side effects and non-pharmacologic comfort measures Outcome: Progressing   Problem: Clinical Measurements: Goal: Respiratory complications will improve Outcome: Progressing   Problem: Nutrition: Goal: Adequate nutrition will be maintained Outcome: Progressing   Problem: Coping: Goal: Level of anxiety will decrease Outcome: Progressing   Problem: Elimination: Goal: Will not experience complications related to urinary retention Outcome: Progressing   Problem: Pain Managment: Goal: General experience of comfort will improve Outcome: Progressing   Problem: Safety: Goal: Ability to remain free from injury will improve Outcome: Progressing   

## 2020-04-07 NOTE — Consult Note (Signed)
CARDIOLOGY CONSULT NOTE  Patient ID: Brian Wolf MRN: [id number] DOB/AGE: Oct 26, 1931 87 y.o.  Admit date: 04/05/2020 Referring Physician  Dr. Irwin Brakeman, MD  Primary Physician:  [name], DO Reason for Consultation: shortness of breath   Patient ID: Brian Wolf, male    DOB: 1931/11/23, 87 y.o.   MRN: [id number]  Chief Complaint  Patient presents with  . Rectal Bleeding   HPI:    Brian Wolf  is a 87 y.o. male with history of stroke in 2015 with no recurrence and has mild residual left facial and also left upper extremity weakness, difficult to control hypertension, hyperlipidemia,sinus node dysfunction evaluated by Dr. Curt Bears, no indication for pacemaker.  Patient had previous history of GI bleed 10/2019 with EGD revealing duodenal ulcer.  Patient presented to [facility] emergency department on 04/05/2020 with complaints of dark stools.  Evaluation emergency department revealed positive stool guaiac, hemoglobin 6.7, therefore he was transfused 1 unit of packed red blood cells.  GI was consulted and Protonix drip started.   Evaluation also revealed lower extremity edema and right-sided pleural effusion, likely due to acute on chronic diastolic congestive heart failure.  Patient complaining of worsening shortness of breath as well as fatigue.  Patient was given 25 g of albumin and Lasix 40 mg IV. He is now on furosemide 40 mg p.o. daily. Patient with acute kidney injury superimposed on chronic kidney disease, spironolactone, and losartan being held.  Cardiology consulted for shortness of breath and new cardiomyopathy with reduced LVEF of 35-40%.   Patient has been hospitalized for the last 2 days due to acute upper GI bleed.  Yesterday patient had worsening shortness of breath and significant increase of wheezing.  Upon further questioning patient and his wife, who is present at bedside, report that patient has had intermittent wheezing over the last several months at  home, however it became acutely worse yesterday patient now has shortness of breath and wet cough.  Patient denies chest pain, palpitations, dizziness, leg swelling, orthopnea.  Past Medical History:  Diagnosis Date  . CVA (cerebral infarction) 12/2013  . Dementia ([place])   . Gastric ulcer    prior to 2000  . Hypertension   . Sleep difficulties    Past Surgical History:  Procedure Laterality Date  . ABDOMINAL SURGERY    . CATARACT EXTRACTION Left   . ESOPHAGOGASTRODUODENOSCOPY N/A 10/18/2019   Procedure: ESOPHAGOGASTRODUODENOSCOPY (EGD);  Surgeon: Wonda Horner, MD;  Location: [facility] ENDOSCOPY;  Service: Endoscopy;  Laterality: N/A;   Family History  Problem Relation Age of Onset  . Hyperlipidemia Mother   . Hypertension Mother   . Hypertension Father    Social History   Tobacco Use  . Smoking status: Never Smoker  . Smokeless tobacco: Never Used  Substance Use Topics  . Alcohol use: No    Alcohol/week: 0.0 standard drinks    Marital Sttus: Married  ROS  Review of Systems  Constitutional: Positive for decreased appetite and malaise/fatigue. Negative for diaphoresis and fever.  HENT: Negative.   Eyes: Negative.   Cardiovascular: Negative for chest pain, leg swelling, near-syncope, orthopnea, palpitations, paroxysmal nocturnal dyspnea and syncope.  Respiratory: Positive for cough, shortness of breath, sputum production and wheezing. Negative for hemoptysis.   Endocrine: Negative.   Hematologic/Lymphatic: Negative.   Skin: Negative.   Musculoskeletal: Negative.   Gastrointestinal: Positive for hematemesis, hematochezia and melena. Negative for abdominal pain.  Genitourinary: Negative.   Neurological: Negative.   Psychiatric/Behavioral: Negative.   Allergic/Immunologic: Negative.  Objective   Vitals with BMI 04/07/2020 04/07/2020 04/07/2020  Height - - -  Weight - - -  BMI - - -  Systolic 440 347 425  Diastolic 57 47 65  Pulse 66 66 73    Blood pressure (!) 132/57,  pulse 66, temperature (!) 97.4 F (36.3 C), temperature source Oral, resp. rate 15, height 5\' 5"  (1.651 m), weight 69.4 kg, SpO2 98 %.    Physical Exam Vitals and nursing note reviewed.  Constitutional:      General: He is not in acute distress.    Appearance: He is not toxic-appearing.  HENT:     Head: Normocephalic and atraumatic.     Nose: Nose normal.     Mouth/Throat:     Mouth: Mucous membranes are moist.  Eyes:     Extraocular Movements: Extraocular movements intact.     Conjunctiva/sclera: Conjunctivae normal.  Cardiovascular:     Rate and Rhythm: Normal rate and regular rhythm.     Pulses: Intact distal pulses.          Dorsalis pedis pulses are 2+ on the right side and 2+ on the left side.       Posterior tibial pulses are 0 on the right side and 0 on the left side.     Heart sounds: S1 normal and S2 normal. No murmur heard. No gallop.   Pulmonary:     Effort: Pulmonary effort is normal. No respiratory distress.     Breath sounds: Wheezing (bilaterally throughout , expiratory ) and rales (right lowewr lobe) present. No rhonchi.     Comments: Increased work of breathing. Wet cough on exam. Patient notably dyspneic while speaking.  Abdominal:     General: Bowel sounds are normal. There is no distension.     Palpations: Abdomen is soft.     Tenderness: There is no abdominal tenderness.  Genitourinary:    Rectum: Guaiac result positive.  Musculoskeletal:     Cervical back: Neck supple.     Right lower leg: No edema.     Left lower leg: No edema.  Skin:    General: Skin is warm and dry.     Findings: No lesion or rash.  Neurological:     General: No focal deficit present.     Mental Status: He is alert and oriented to person, place, and time.  Psychiatric:        Mood and Affect: Mood normal.        Behavior: Behavior normal.    Laboratory examination:    Recent Labs    10/20/19 0533 10/21/19 0547 10/21/19 1658 10/22/19 0122 04/05/20 0729 04/06/20 0642  04/07/20 0252  NA 137 136  --  135 136 140 139  K 5.0 5.1   < > 4.5 5.0 4.5 4.3  CL 112* 107  --  107 107 110 109  CO2 18* 20*  --  21* 19* 17* 20*  GLUCOSE 111* 95  --  113* 105* 83 122*  BUN 53* 49*  --  44* 72* 69* 65*  CREATININE 1.88* 2.06*  --  1.79* 2.63* 2.77* 2.96*  CALCIUM 8.5* 8.2*  --  8.0* 7.9* 8.1* 8.1*  GFRNONAA 32* 28*  --  34* 23* 22* 20*  GFRAA 37* 33*  --  39*  --   --   --    < > = values in this interval not displayed.   estimated creatinine clearance is 15.6 mL/min (A) (by C-G formula based on  SCr of 2.96 mg/dL (H)).  CMP Latest Ref Rng & Units 04/07/2020 04/06/2020 04/05/2020  Glucose 70 - 99 mg/dL 122(H) 83 105(H)  BUN 8 - 23 mg/dL 65(H) 69(H) 72(H)  Creatinine 0.61 - 1.24 mg/dL 2.96(H) 2.77(H) 2.63(H)  Sodium 135 - 145 mmol/L 139 140 136  Potassium 3.5 - 5.1 mmol/L 4.3 4.5 5.0  Chloride 98 - 111 mmol/L 109 110 107  CO2 22 - 32 mmol/L 20(L) 17(L) 19(L)  Calcium 8.9 - 10.3 mg/dL 8.1(L) 8.1(L) 7.9(L)  Total Protein 6.5 - 8.1 g/dL - - 4.8(L)  Total Bilirubin 0.3 - 1.2 mg/dL - - 0.7  Alkaline Phos 38 - 126 U/L - - 41  AST 15 - 41 U/L - - 22  ALT 0 - 44 U/L - - 14   CBC Latest Ref Rng & Units 04/07/2020 04/06/2020 04/06/2020  WBC 4.0 - 10.5 K/uL 11.0(H) 11.6(H) 10.7(H)  Hemoglobin 13.0 - 17.0 g/dL 8.4(L) 9.2(L) 8.7(L)  Hematocrit 39.0 - 52.0 % 26.2(L) 28.2(L) 26.2(L)  Platelets 150 - 400 K/uL 300 302 272   Lipid Panel No results for input(s): CHOL, TRIG, LDLCALC, VLDL, HDL, CHOLHDL, LDLDIRECT in the last 8760 hours.  HEMOGLOBIN A1C Lab Results  Component Value Date   HGBA1C 6.4 (H) 12/15/2013   MPG 137 (H) 12/15/2013   TSH Recent Labs    10/20/19 0706 04/05/20 1847  TSH 2.706 4.188   BNP (last 3 results) Recent Labs    04/05/20 1604  BNP 203.3*   Results for orders placed or performed during the hospital encounter of 04/05/20 (from the past 48 hour(s))  Hemoglobin and hematocrit, blood     Status: Abnormal   Collection Time: 04/06/20  1:20 AM  Result  Value Ref Range   Hemoglobin 8.5 (L) 13.0 - 17.0 g/dL   HCT 25.3 (L) 39.0 - 52.0 %    Comment: Performed at [facility] [address], [place], [place] [address]  CBC     Status: Abnormal   Collection Time: 04/06/20  6:42 AM  Result Value Ref Range   WBC 10.7 (H) 4.0 - 10.5 K/uL   RBC 2.87 (L) 4.22 - 5.81 MIL/uL   Hemoglobin 8.7 (L) 13.0 - 17.0 g/dL   HCT 26.2 (L) 39.0 - 52.0 %   MCV 91.3 80.0 - 100.0 fL   MCH 30.3 26.0 - 34.0 pg   MCHC 33.2 30.0 - 36.0 g/dL   RDW 16.7 (H) 11.5 - 15.5 %   Platelets 272 150 - 400 K/uL   nRBC 0.6 (H) 0.0 - 0.2 %    Comment: Performed at [facility] [address] [place], [place] [address]  Basic metabolic panel     Status: Abnormal   Collection Time: 04/06/20  6:42 AM  Result Value Ref Range   Sodium 140 135 - 145 mmol/L   Potassium 4.5 3.5 - 5.1 mmol/L   Chloride 110 98 - 111 mmol/L   CO2 17 (L) 22 - 32 mmol/L   Glucose, Bld 83 70 - 99 mg/dL    Comment: Glucose reference range applies only to samples taken after fasting for at least 8 hours.   BUN 69 (H) 8 - 23 mg/dL   Creatinine, Ser 2.77 (H) 0.61 - 1.24 mg/dL   Calcium 8.1 (L) 8.9 - 10.3 mg/dL   GFR, Estimated 22 (L) >60 mL/min    Comment: (NOTE) Calculated using the CKD-EPI Creatinine Equation (2021)    Anion gap 13 5 - 15  Comment: Performed at [facility], [place] [address], [place], [place] [address]  CBC     Status: Abnormal   Collection Time: 04/06/20  4:10 PM  Result Value Ref Range   WBC 11.6 (H) 4.0 - 10.5 K/uL   RBC 3.05 (L) 4.22 - 5.81 MIL/uL   Hemoglobin 9.2 (L) 13.0 - 17.0 g/dL   HCT 28.2 (L) 39.0 - 52.0 %   MCV 92.5 80.0 - 100.0 fL   MCH 30.2 26.0 - 34.0 pg   MCHC 32.6 30.0 - 36.0 g/dL   RDW 16.7 (H) 11.5 - 15.5 %   Platelets 302 150 - 400 K/uL   nRBC 0.6 (H) 0.0 - 0.2 %    Comment: Performed at [facility] [address], [place], [place] [address]  CBC     Status: Abnormal   Collection Time: 04/07/20  2:52 AM  Result Value Ref Range    WBC 11.0 (H) 4.0 - 10.5 K/uL   RBC 2.80 (L) 4.22 - 5.81 MIL/uL   Hemoglobin 8.4 (L) 13.0 - 17.0 g/dL   HCT 26.2 (L) 39.0 - 52.0 %   MCV 93.6 80.0 - 100.0 fL   MCH 30.0 26.0 - 34.0 pg   MCHC 32.1 30.0 - 36.0 g/dL   RDW 16.6 (H) 11.5 - 15.5 %   Platelets 300 150 - 400 K/uL   nRBC 0.5 (H) 0.0 - 0.2 %    Comment: Performed at [facility] [address], [place], [place] [address]  Renal function panel     Status: Abnormal   Collection Time: 04/07/20  2:52 AM  Result Value Ref Range   Sodium 139 135 - 145 mmol/L   Potassium 4.3 3.5 - 5.1 mmol/L   Chloride 109 98 - 111 mmol/L   CO2 20 (L) 22 - 32 mmol/L   Glucose, Bld 122 (H) 70 - 99 mg/dL    Comment: Glucose reference range applies only to samples taken after fasting for at least 8 hours.   BUN 65 (H) 8 - 23 mg/dL   Creatinine, Ser 2.96 (H) 0.61 - 1.24 mg/dL   Calcium 8.1 (L) 8.9 - 10.3 mg/dL   Phosphorus 5.1 (H) 2.5 - 4.6 mg/dL   Albumin 2.3 (L) 3.5 - 5.0 g/dL   GFR, Estimated 20 (L) >60 mL/min    Comment: (NOTE) Calculated using the CKD-EPI Creatinine Equation (2021)    Anion gap 10 5 - 15    Comment: Performed at [facility] [address], [place], [place] [address]    Medications and allergies  No Known Allergies   Current Meds  Medication Sig  . aspirin 81 MG EC tablet Take 81 mg by mouth daily.  . fexofenadine (ALLEGRA) 30 MG tablet Take 30 mg by mouth daily as needed (allergies).   . furosemide (LASIX) 20 MG tablet Take 20 mg by mouth.  . hydrALAZINE (APRESOLINE) 50 MG tablet Take 1 tablet (50 mg total) by mouth 3 (three) times daily.  . hydrochlorothiazide (HYDRODIURIL) 25 MG tablet Take 25 mg by mouth daily.  . isosorbide dinitrate (ISORDIL) 30 MG tablet Take 1 tablet (30 mg total) by mouth 3 (three) times daily.  Marland Kitchen losartan (COZAAR) 100 MG tablet Take 100 mg by mouth daily.  . metoprolol tartrate (LOPRESSOR) 50 MG tablet Take 50 mg by mouth 2 (two) times daily.  Marland Kitchen NIFEdipine (PROCARDIA XL) 60 MG  24 hr tablet Take 1 tablet (60 mg total) by mouth  daily.  . pantoprazole (PROTONIX) 40 MG tablet Take 1 tablet (40 mg total) by mouth 2 (two) times daily before a meal.  . Probiotic Product (PROBIOTIC DAILY PO) Take 1 capsule by mouth daily.   . simvastatin (ZOCOR) 40 MG tablet Take 40 mg by mouth daily.  Marland Kitchen spironolactone (ALDACTONE) 25 MG tablet Take 1 tablet (25 mg total) by mouth daily.  . tamsulosin (FLOMAX) 0.4 MG CAPS capsule Take 0.4 mg by mouth daily. 30 minutes after each meal  . Travoprost, BAK Free, (TRAVATAN) 0.004 % SOLN ophthalmic solution Place 1 drop into both eyes at bedtime.   . [DISCONTINUED] magic mouthwash (lidocaine, diphenhydrAMINE, alum & mag hydroxide) suspension Swish and spit 5 mLs every 6 (six) hours as needed for mouth pain.  . [DISCONTINUED] naproxen (NAPROSYN) 500 MG tablet Take 1 tablet (500 mg total) by mouth 2 (two) times daily as needed for up to 5 days for moderate pain.    Scheduled Meds: . furosemide  40 mg Intramuscular Daily  . hydrALAZINE  50 mg Oral TID  . ipratropium-albuterol  3 mL Nebulization Q6H  . isosorbide dinitrate  30 mg Oral TID  . latanoprost  1 drop Both Eyes QHS  . [START ON 04/08/2020] mouth rinse  15 mL Mouth Rinse BID  . methylPREDNISolone (SOLU-MEDROL) injection  40 mg Intravenous Q6H  . metoprolol tartrate  50 mg Oral BID  . pantoprazole  40 mg Oral BID AC  . simvastatin  40 mg Oral Daily  . sodium chloride flush  3 mL Intravenous Q12H  . sucralfate  1 g Oral TID WC & HS  . tamsulosin  0.4 mg Oral Daily   Continuous Infusions: . sodium chloride    . magnesium sulfate bolus IVPB 2 g (04/07/20 2131)   PRN Meds:.ipratropium-albuterol, loratadine   I/O last 3 completed shifts: In: 857.1 [P.O.:540; I.V.:317.1] Out: 1100 [Urine:1100] No intake/output data recorded.    Radiology:   [facility] 04/07/2020:  CLINICAL DATA:  Follow-up effusions and atelectasis versus pneumonia. EXAM: PORTABLE CHEST 1 VIEW COMPARISON:   04/06/2020 and earlier. FINDINGS: Cardiac silhouette mildly enlarged, unchanged. Pulmonary vascularity normal without evidence of pulmonary edema. Large RIGHT pleural effusion with associated dense consolidation in the RIGHT MIDDLE LOBE and RIGHT LOWER LOBE, unchanged. Small LEFT pleural effusion with associated consolidation in the LEFT LOWER LOBE, unchanged. No new abnormalities. IMPRESSION: 1. Stable large RIGHT pleural effusion and associated dense atelectasis and/or pneumonia in the RIGHT MIDDLE LOBE and RIGHT LOWER LOBE. 2. Stable small LEFT pleural effusion and associated passive atelectasis and/or pneumonia in the LEFT LOWER LOBE. 3. No new abnormalities. Electronically Signed   By: Evangeline Dakin M.D.   On: 04/07/2020 16:43   DG CHEST PORT 04/06/2020:  CLINICAL DATA:  Dyspnea EXAM: PORTABLE CHEST 1 VIEW COMPARISON:  04/05/2020 FINDINGS: Large right pleural effusion is unchanged with compressive atelectasis of the right lung base and opacification of the right hemithorax. Small left pleural effusion is present with persistent retrocardiac atelectasis. Right paratracheal opacity is unchanged from multiple prior examinations likely representing vascular shadow. Cardiac size within normal limits. The pulmonary vascularity is normal. No acute bone abnormality. Multiple healed right rib fractures are noted. IMPRESSION: Stable large right and small left pleural effusions with associated bibasilar compressive atelectasis. Electronically Signed   By: Fidela Salisbury MD   On: 04/06/2020 23:04    Cardiac Studies:   Echocardiogram 03/09/2016:  Left ventricle: The cavity size was normal. Wall thickness was increased in a pattern of  mild LVH. Systolic function was  vigorous. The estimated ejection fraction was in the range of 65% to 70%. Wall motion was normal; there were no regional wall  motion abnormalities. Doppler parameters are consistent with abnormal left ventricular relaxation (grade 1  diastolic dysfunction).  - Aortic valve: Trileaflet; mildly thickened, mildly calcified leaflets.  - Left atrium: The atrium was mildly dilated  Long Term Monitor 11/02/2019: Max 139 bpm 09:19am, 09/18 Min 31 bpm 05:31am, 09/15 Avg 71 bpm 1.4% PACs 3.2% PVCs Predominant rhythm was sinus rhythm No prolonged arrhythmias noted Multiple episodes of complete AV block with a narrow QRS escape occurred between 5-5:30 AM  Echocardiogram 04/06/2020 1. Left ventricular ejection fraction, by estimation, is 35 to 40%. The  left ventricle has moderately decreased function. The left ventricle has  no regional wall motion abnormalities. Left ventricular diastolic  parameters are consistent with Grade I  diastolic dysfunction (impaired relaxation).  2. Right ventricular systolic function is normal. The right ventricular  size is normal. There is normal pulmonary artery systolic pressure.  3. The mitral valve is normal in structure. No evidence of mitral valve  regurgitation. No evidence of mitral stenosis.  4. The aortic valve is tricuspid. Aortic valve regurgitation is mild.  Mild to moderate aortic valve sclerosis/calcification is present, without  any evidence of aortic stenosis.  5. The inferior vena cava is normal in size with greater than 50%  respiratory variability, suggesting right atrial pressure of 3 mmHg.  EKG: EKG 11/05/2019: Sinus rhythm at a rate of 84 bpm with first-degree AV block and 2 PVCs. Normal axis. Poor R wave progression, cannot exclude anterior septal infarct old.   EKG 04/06/2019: Sinus rhythm at a rate of 89 bpm.  Normal axis.  Poor progression, cannot exclude anteroseptal infarct old.  Nonspecific T wave abnormality.  No evidence of ischemia or underlying injury pattern.  Assessment  Brian Wolf  is a 87 y.o.. male with history of stroke in 2015 with no recurrence and has mild residual left facial and also left upper extremity weakness, difficult to  control hypertension, hyperlipidemia,sinus node dysfunction evaluated by Dr. Curt Bears, no indication for pacemaker.  Presented to the hospital 2 days ago with acute upper GI bleed found to have duodenal ulcer, now with worsening shortness of breath over the last 24 hours.  Acute upper GI bleed Acute on chronic combined diastolic and systolic heart failure Shortness of breath Acute blood loss anemia Right pleural effusion Acute kidney injury superimposed on chronic kidney disease  Recommendations:   Shortness of breath: Patient's shortness of breath is likely multifactorial including some component of acute on chronic diastolic as well as systolic heart failure as patient's BNP is elevated and echocardiogram revealed newly reduced LVEF of 35-40%.  In view of no regional wall motion abnormalities and recent acute GI bleed, suspect nonischemic cardiomyopathy.  Patient's LVEF will hopefully improve with management of medical comorbidities at this time.    However do not suspect acute heart failure to be driving component of shortness of breath.  On exam patient appears euvolemic and I personally reviewed patient's chest x-rays with Dr. Einar Gip who agrees chest x-ray is suggestive of pneumonia rather than significant pleural effusion due to underlying heart failure.  Suspect wheezing and shortness of breath may be driven by pneumonia, probably secondary to aspiration.  Will defer further management of suspected underlying pneumonia to primary team, would recommend chest physical therapy, speech therapy evaluation, as well as potentially steroids and albuterol inhaler.  Other  underlying etiology likely contributing to patient's shortness of breath includes acute blood loss anemia and deconditioning.  Acute on chronic combined diastolic and systolic heart failure:  Patient does have component of acute heart failure contributing to present symptoms.  We will continue gentle diuresis.  Stop oral furosemide  and switch patient to furosemide 40 mg IV daily.  We will continue to monitor renal function closely in view of acute kidney injury.  We will trend serial BMPs over the next 3 days.  We will continue hydralazine and isosorbide dinitrate.  We will also provide magnesium supplement.  Right pleural effusion: Personal review of chest x-ray consistent with suspicion for underlying aspiration pneumonia, which may be contributing to pleural effusion.  However in view of patient's elevated BNP and new systolic heart failure we will continue gentle diuresis.  Acute kidney injury: Continue gentle diuresis with close monitoring of renal function.  Acute blood loss anemia: Management per GI and primary team.  Acute upper GI bleed:  Management per GI and primary team.  Patient was seen in collaboration with Dr. Einar Gip. He also reviewed patient's chart and examined the patient. Dr. Einar Gip is in agreement of the plan.    Alethia Berthold, PA-C 04/07/2020, 9:39 PM Office: [phone]

## 2020-04-07 NOTE — Progress Notes (Signed)
Patient have one small dark stool last night.    Hemoglobin trending steady, BUN declining gradually, although creatinine continues to rise.  Gastric biopsies pending.  Echo shows ejection fraction of 35 to 40%.  I will sign off at this time.  Please call if I can be of further assistance with this patient.  Please see my note from yesterday for recommendations and plans for this patient.  Cleotis Nipper, M.D. Pager [phone] If no answer or after 5 PM call [phone]

## 2020-04-07 NOTE — Consult Note (Incomplete)
CARDIOLOGY CONSULT NOTE  Patient ID: Brian Wolf MRN: [id number] DOB/AGE: December 27, 1931 87 y.o.  Admit date: 04/05/2020 Referring Physician  Dr. Irwin Brakeman, MD  Primary Physician:  [name], DO Reason for Consultation:  Heart failure   Patient ID: Brian Wolf, male    DOB: 02-24-1931, 87 y.o.   MRN: [id number]  Chief Complaint  Patient presents with  . Rectal Bleeding   HPI:    Brian Wolf  is a 87 y.o. male with history of stroke in 2015 with no recurrence and has mild residual left facial and also left upper extremity weakness, difficult to control hypertension, hyperlipidemia, sinus node dysfunction evaluated by Dr. Curt Bears, no indication for pacemaker.  Patient had previous history of GI bleed 10/2019 with EGD revealing duodenal ulcer.  Patient presented to [facility] emergency department on 04/05/2020 with complaints of dark stools.  Evaluation emergency department revealed positive stool guaiac, hemoglobin 6.7, therefore he was transfused 1 unit of packed red blood cells.  GI was consulted and Protonix drip started.   Evaluation also revealed lower extremity edema and right-sided pleural effusion, likely due to acute on chronic diastolic congestive heart failure.  Patient complaining of worsening shortness of breath as well as fatigue.  Patient was given 25 g of albumin and Lasix 40 mg IV. He is now on furosemide 40 mg p.o. daily. Patient with acute kidney injury superimposed on chronic kidney disease, spironolactone, and losartan being held.  Cardiology consulted for further diuresis recommendations as well as cardiomyopathy with newly reduced LVEF to 35-40%.   Past Medical History:  Diagnosis Date  . CVA (cerebral infarction) 12/2013  . Dementia ([place])   . Gastric ulcer    prior to 2000  . Hypertension   . Sleep difficulties    Past Surgical History:  Procedure Laterality Date  . ABDOMINAL SURGERY    . CATARACT EXTRACTION Left   .  ESOPHAGOGASTRODUODENOSCOPY N/A 10/18/2019   Procedure: ESOPHAGOGASTRODUODENOSCOPY (EGD);  Surgeon: Wonda Horner, MD;  Location: [facility] ENDOSCOPY;  Service: Endoscopy;  Laterality: N/A;   Family History  Problem Relation Age of Onset  . Hyperlipidemia Mother   . Hypertension Mother   . Hypertension Father    Social History   Tobacco Use  . Smoking status: Never Smoker  . Smokeless tobacco: Never Used  Substance Use Topics  . Alcohol use: No    Alcohol/week: 0.0 standard drinks    Marital Sttus: Married  ROS  ***ROS Objective   Vitals with BMI 04/07/2020 04/07/2020 04/07/2020  Height - - -  Weight - - -  BMI - - -  Systolic 413 244 010  Diastolic 47 65 56  Pulse 66 73 66    Blood pressure (!) 111/47, pulse 66, temperature 98.4 F (36.9 C), temperature source Oral, resp. rate 20, height 5\' 5"  (1.651 m), weight 69.4 kg, SpO2 93 %.    ***Physical Exam Laboratory examination:   *** Recent Labs    10/20/19 0533 10/21/19 0547 10/21/19 1658 10/22/19 0122 04/05/20 0729 04/06/20 0642 04/07/20 0252  NA 137 136  --  135 136 140 139  K 5.0 5.1   < > 4.5 5.0 4.5 4.3  CL 112* 107  --  107 107 110 109  CO2 18* 20*  --  21* 19* 17* 20*  GLUCOSE 111* 95  --  113* 105* 83 122*  BUN 53* 49*  --  44* 72* 69* 65*  CREATININE 1.88* 2.06*  --  1.79* 2.63* 2.77*  2.96*  CALCIUM 8.5* 8.2*  --  8.0* 7.9* 8.1* 8.1*  GFRNONAA 32* 28*  --  34* 23* 22* 20*  GFRAA 37* 33*  --  39*  --   --   --    < > = values in this interval not displayed.   estimated creatinine clearance is 15.6 mL/min (A) (by C-G formula based on SCr of 2.96 mg/dL (H)).  CMP Latest Ref Rng & Units 04/07/2020 04/06/2020 04/05/2020  Glucose 70 - 99 mg/dL 122(H) 83 105(H)  BUN 8 - 23 mg/dL 65(H) 69(H) 72(H)  Creatinine 0.61 - 1.24 mg/dL 2.96(H) 2.77(H) 2.63(H)  Sodium 135 - 145 mmol/L 139 140 136  Potassium 3.5 - 5.1 mmol/L 4.3 4.5 5.0  Chloride 98 - 111 mmol/L 109 110 107  CO2 22 - 32 mmol/L 20(L) 17(L) 19(L)  Calcium 8.9 - 10.3  mg/dL 8.1(L) 8.1(L) 7.9(L)  Total Protein 6.5 - 8.1 g/dL - - 4.8(L)  Total Bilirubin 0.3 - 1.2 mg/dL - - 0.7  Alkaline Phos 38 - 126 U/L - - 41  AST 15 - 41 U/L - - 22  ALT 0 - 44 U/L - - 14   CBC Latest Ref Rng & Units 04/07/2020 04/06/2020 04/06/2020  WBC 4.0 - 10.5 K/uL 11.0(H) 11.6(H) 10.7(H)  Hemoglobin 13.0 - 17.0 g/dL 8.4(L) 9.2(L) 8.7(L)  Hematocrit 39.0 - 52.0 % 26.2(L) 28.2(L) 26.2(L)  Platelets 150 - 400 K/uL 300 302 272   Lipid Panel No results for input(s): CHOL, TRIG, LDLCALC, VLDL, HDL, CHOLHDL, LDLDIRECT in the last 8760 hours.  HEMOGLOBIN A1C Lab Results  Component Value Date   HGBA1C 6.4 (H) 12/15/2013   MPG 137 (H) 12/15/2013   TSH Recent Labs    10/20/19 0706 04/05/20 1847  TSH 2.706 4.188   BNP (last 3 results) Recent Labs    04/05/20 1604  BNP 203.3*   Results for orders placed or performed during the hospital encounter of 04/05/20 (from the past 48 hour(s))  Hemoglobin and hematocrit, blood     Status: Abnormal   Collection Time: 04/05/20  6:47 PM  Result Value Ref Range   Hemoglobin 9.1 (L) 13.0 - 17.0 g/dL   HCT 28.2 (L) 39.0 - 52.0 %    Comment: Performed at [facility], [place] [address] [place], [place] [address]  TSH     Status: None   Collection Time: 04/05/20  6:47 PM  Result Value Ref Range   TSH 4.188 0.350 - 4.500 uIU/mL    Comment: Performed by a 3rd Generation assay with a functional sensitivity of <=0.01 uIU/mL. Performed at [facility], [place] [address], [place],  [address]   Hemoglobin and hematocrit, blood     Status: Abnormal   Collection Time: 04/06/20  1:20 AM  Result Value Ref Range   Hemoglobin 8.5 (L) 13.0 - 17.0 g/dL   HCT 25.3 (L) 39.0 - 52.0 %    Comment: Performed at [facility] [address], [place], [place] [address]  CBC     Status: Abnormal   Collection Time: 04/06/20  6:42 AM  Result Value Ref Range   WBC 10.7 (H) 4.0 - 10.5 K/uL   RBC 2.87 (L) 4.22 - 5.81 MIL/uL   Hemoglobin 8.7 (L)  13.0 - 17.0 g/dL   HCT 26.2 (L) 39.0 - 52.0 %   MCV 91.3 80.0 - 100.0 fL   MCH 30.3 26.0 - 34.0 pg   MCHC 33.2 30.0 - 36.0 g/dL  RDW 16.7 (H) 11.5 - 15.5 %   Platelets 272 150 - 400 K/uL   nRBC 0.6 (H) 0.0 - 0.2 %    Comment: Performed at [facility] [address], [place], [place] [address]  Basic metabolic panel     Status: Abnormal   Collection Time: 04/06/20  6:42 AM  Result Value Ref Range   Sodium 140 135 - 145 mmol/L   Potassium 4.5 3.5 - 5.1 mmol/L   Chloride 110 98 - 111 mmol/L   CO2 17 (L) 22 - 32 mmol/L   Glucose, Bld 83 70 - 99 mg/dL    Comment: Glucose reference range applies only to samples taken after fasting for at least 8 hours.   BUN 69 (H) 8 - 23 mg/dL   Creatinine, Ser 2.77 (H) 0.61 - 1.24 mg/dL   Calcium 8.1 (L) 8.9 - 10.3 mg/dL   GFR, Estimated 22 (L) >60 mL/min    Comment: (NOTE) Calculated using the CKD-EPI Creatinine Equation (2021)    Anion gap 13 5 - 15    Comment: Performed at [facility] [address] [place], [place] [address]  CBC     Status: Abnormal   Collection Time: 04/06/20  4:10 PM  Result Value Ref Range   WBC 11.6 (H) 4.0 - 10.5 K/uL   RBC 3.05 (L) 4.22 - 5.81 MIL/uL   Hemoglobin 9.2 (L) 13.0 - 17.0 g/dL   HCT 28.2 (L) 39.0 - 52.0 %   MCV 92.5 80.0 - 100.0 fL   MCH 30.2 26.0 - 34.0 pg   MCHC 32.6 30.0 - 36.0 g/dL   RDW 16.7 (H) 11.5 - 15.5 %   Platelets 302 150 - 400 K/uL   nRBC 0.6 (H) 0.0 - 0.2 %    Comment: Performed at [facility] [address], [place], [place] [address]  CBC     Status: Abnormal   Collection Time: 04/07/20  2:52 AM  Result Value Ref Range   WBC 11.0 (H) 4.0 - 10.5 K/uL   RBC 2.80 (L) 4.22 - 5.81 MIL/uL   Hemoglobin 8.4 (L) 13.0 - 17.0 g/dL   HCT 26.2 (L) 39.0 - 52.0 %   MCV 93.6 80.0 - 100.0 fL   MCH 30.0 26.0 - 34.0 pg   MCHC 32.1 30.0 - 36.0 g/dL   RDW 16.6 (H) 11.5 - 15.5 %   Platelets 300 150 - 400 K/uL   nRBC 0.5 (H) 0.0 - 0.2 %    Comment: Performed at [facility] [address] [place], [place] [address]  Renal function panel     Status: Abnormal   Collection Time: 04/07/20  2:52 AM  Result Value Ref Range   Sodium 139 135 - 145 mmol/L   Potassium 4.3 3.5 - 5.1 mmol/L   Chloride 109 98 - 111 mmol/L   CO2 20 (L) 22 - 32 mmol/L   Glucose, Bld 122 (H) 70 - 99 mg/dL    Comment: Glucose reference range applies only to samples taken after fasting for at least 8 hours.   BUN 65 (H) 8 - 23 mg/dL   Creatinine, Ser 2.96 (H) 0.61 - 1.24 mg/dL   Calcium 8.1 (L) 8.9 - 10.3 mg/dL   Phosphorus 5.1 (H) 2.5 - 4.6 mg/dL   Albumin 2.3 (L) 3.5 - 5.0 g/dL   GFR, Estimated 20 (L) >60 mL/min    Comment: (NOTE) Calculated using the CKD-EPI Creatinine Equation (2021)  Anion gap 10 5 - 15    Comment: Performed at [facility] [address], [place], [place] [address]    Medications and allergies  No Known Allergies   Current Meds  Medication Sig  . aspirin 81 MG EC tablet Take 81 mg by mouth daily.  . fexofenadine (ALLEGRA) 30 MG tablet Take 30 mg by mouth daily as needed (allergies).   . furosemide (LASIX) 20 MG tablet Take 20 mg by mouth.  . hydrALAZINE (APRESOLINE) 50 MG tablet Take 1 tablet (50 mg total) by mouth 3 (three) times daily.  . hydrochlorothiazide (HYDRODIURIL) 25 MG tablet Take 25 mg by mouth daily.  . isosorbide dinitrate (ISORDIL) 30 MG tablet Take 1 tablet (30 mg total) by mouth 3 (three) times daily.  Marland Kitchen losartan (COZAAR) 100 MG tablet Take 100 mg by mouth daily.  . metoprolol tartrate (LOPRESSOR) 50 MG tablet Take 50 mg by mouth 2 (two) times daily.  Marland Kitchen NIFEdipine (PROCARDIA XL) 60 MG 24 hr tablet Take 1 tablet (60 mg total) by mouth daily.  . pantoprazole (PROTONIX) 40 MG tablet Take 1 tablet (40 mg total) by mouth 2 (two) times daily before a meal.  . Probiotic Product (PROBIOTIC DAILY PO) Take 1 capsule by mouth daily.   . simvastatin (ZOCOR) 40 MG tablet Take 40 mg by mouth daily.  Marland Kitchen spironolactone (ALDACTONE) 25 MG  tablet Take 1 tablet (25 mg total) by mouth daily.  . tamsulosin (FLOMAX) 0.4 MG CAPS capsule Take 0.4 mg by mouth daily. 30 minutes after each meal  . Travoprost, BAK Free, (TRAVATAN) 0.004 % SOLN ophthalmic solution Place 1 drop into both eyes at bedtime.   . [DISCONTINUED] magic mouthwash (lidocaine, diphenhydrAMINE, alum & mag hydroxide) suspension Swish and spit 5 mLs every 6 (six) hours as needed for mouth pain.  . [DISCONTINUED] naproxen (NAPROSYN) 500 MG tablet Take 1 tablet (500 mg total) by mouth 2 (two) times daily as needed for up to 5 days for moderate pain.    Scheduled Meds: . furosemide  40 mg Oral Daily  . hydrALAZINE  50 mg Oral TID  . ipratropium-albuterol  3 mL Nebulization Q6H  . isosorbide dinitrate  30 mg Oral TID  . latanoprost  1 drop Both Eyes QHS  . [START ON 04/08/2020] mouth rinse  15 mL Mouth Rinse BID  . metoprolol tartrate  50 mg Oral BID  . NIFEdipine  60 mg Oral Daily  . pantoprazole  40 mg Oral BID AC  . simvastatin  40 mg Oral Daily  . sodium chloride flush  3 mL Intravenous Q12H  . sucralfate  1 g Oral TID WC & HS  . tamsulosin  0.4 mg Oral Daily   Continuous Infusions: . sodium chloride     PRN Meds:.ipratropium-albuterol, loratadine   I/O last 3 completed shifts: In: 614.1 [P.O.:300; I.V.:314.1] Out: 1300 [Urine:1300] Total I/O In: 243 [P.O.:240; I.V.:3] Out: -     Radiology:  [facility] 04/07/2020:  CLINICAL DATA:  Follow-up effusions and atelectasis versus pneumonia. EXAM: PORTABLE CHEST 1 VIEW COMPARISON:  04/06/2020 and earlier. FINDINGS: Cardiac silhouette mildly enlarged, unchanged. Pulmonary vascularity normal without evidence of pulmonary edema. Large RIGHT pleural effusion with associated dense consolidation in the RIGHT MIDDLE LOBE and RIGHT LOWER LOBE, unchanged. Small LEFT pleural effusion with associated consolidation in the LEFT LOWER LOBE, unchanged. No new abnormalities. IMPRESSION: 1. Stable large RIGHT pleural effusion and  associated dense atelectasis and/or pneumonia in the RIGHT MIDDLE LOBE and  RIGHT LOWER LOBE. 2. Stable small LEFT pleural effusion and associated passive atelectasis and/or pneumonia in the LEFT LOWER LOBE. 3. No new abnormalities. Electronically Signed   By: Evangeline Dakin M.D.   On: 04/07/2020 16:43   DG CHEST PORT 04/06/2020:  CLINICAL DATA:  Dyspnea EXAM: PORTABLE CHEST 1 VIEW COMPARISON:  04/05/2020 FINDINGS: Large right pleural effusion is unchanged with compressive atelectasis of the right lung base and opacification of the right hemithorax. Small left pleural effusion is present with persistent retrocardiac atelectasis. Right paratracheal opacity is unchanged from multiple prior examinations likely representing vascular shadow. Cardiac size within normal limits. The pulmonary vascularity is normal. No acute bone abnormality. Multiple healed right rib fractures are noted. IMPRESSION: Stable large right and small left pleural effusions with associated bibasilar compressive atelectasis. Electronically Signed   By: Fidela Salisbury MD   On: 04/06/2020 23:04    Cardiac Studies:   Echocardiogram 03/09/2016:  Left ventricle: The cavity size was normal. Wall thickness was  increased in a pattern of mild LVH. Systolic function was  vigorous. The estimated ejection fraction was in the range of 65%  to 70%. Wall motion was normal; there were no regional wall  motion abnormalities. Doppler parameters are consistent with abnormal left ventricular relaxation (grade 1 diastolic dysfunction).  - Aortic valve: Trileaflet; mildly thickened, mildly calcified leaflets.  - Left atrium: The atrium was mildly dilated  Long Term Monitor 11/02/2019:  Max 139 bpm 09:19am, 09/18 Min 31 bpm 05:31am, 09/15 Avg 71 bpm 1.4% PACs 3.2% PVCs Predominant rhythm was sinus rhythm No prolonged arrhythmias noted Multiple episodes of complete AV block with a narrow QRS escape occurred between 5-5:30  AM  Echocardiogram 04/06/2020 1. Left ventricular ejection fraction, by estimation, is 35 to 40%. The  left ventricle has moderately decreased function. The left ventricle has  no regional wall motion abnormalities. Left ventricular diastolic  parameters are consistent with Grade I  diastolic dysfunction (impaired relaxation).  2. Right ventricular systolic function is normal. The right ventricular  size is normal. There is normal pulmonary artery systolic pressure.  3. The mitral valve is normal in structure. No evidence of mitral valve  regurgitation. No evidence of mitral stenosis.  4. The aortic valve is tricuspid. Aortic valve regurgitation is mild.  Mild to moderate aortic valve sclerosis/calcification is present, without  any evidence of aortic stenosis.  5. The inferior vena cava is normal in size with greater than 50%  respiratory variability, suggesting right atrial pressure of 3 mmHg.  EKG: EKG 11/05/2019: Sinus rhythm at a rate of 84 bpm with first-degree AV block and 2 PVCs.  Normal axis.  Poor R wave progression, cannot exclude anterior septal infarct old.   EKG 04/06/2019: Sinus rhythm at a rate of 89 bpm.  Normal axis.  Poor progression, cannot exclude anteroseptal infarct old.  Nonspecific T wave abnormality.  No evidence of ischemia or underlying injury pattern.  Assessment   ***  Medications Discontinued During This Encounter  Medication Reason  . magic mouthwash (lidocaine, diphenhydrAMINE, alum & mag hydroxide) suspension Entry Error  . naproxen (NAPROSYN) 500 MG tablet Entry Error  . SPS 15 GM/60ML suspension Patient Preference  . 0.9 %  sodium chloride infusion Patient Transfer  . pantoprazole (PROTONIX) 80 mg in sodium chloride 0.9 % 100 mL (0.8 mg/mL) infusion   . furosemide (LASIX) tablet 20 mg   . albumin human 25 % solution 25 g Completed Course    Recommendations:   ***   Celeste  Royston Sinner, MD, [facility] 04/07/2020, 5:03 PM Office: [phone]

## 2020-04-07 NOTE — Plan of Care (Signed)
  Problem: Education: Goal: Knowledge of General Education information will improve Description: Including pain rating scale, medication(s)/side effects and non-pharmacologic comfort measures 04/07/2020 0703 by Sherre Lain, RN Outcome: Progressing 04/07/2020 0702 by Sherre Lain, RN Outcome: Progressing   Problem: Health Behavior/Discharge Planning: Goal: Ability to manage health-related needs will improve 04/07/2020 0703 by Sherre Lain, RN Outcome: Progressing 04/07/2020 0702 by Sherre Lain, RN Outcome: Progressing   Problem: Clinical Measurements: Goal: Ability to maintain clinical measurements within normal limits will improve 04/07/2020 0703 by Sherre Lain, RN Outcome: Progressing 04/07/2020 0702 by Sherre Lain, RN Outcome: Progressing Goal: Will remain free from infection 04/07/2020 0703 by Sherre Lain, RN Outcome: Progressing 04/07/2020 0702 by Sherre Lain, RN Outcome: Progressing Goal: Diagnostic test results will improve 04/07/2020 0703 by Sherre Lain, RN Outcome: Progressing 04/07/2020 0702 by Sherre Lain, RN Outcome: Progressing Goal: Respiratory complications will improve 04/07/2020 0703 by Sherre Lain, RN Outcome: Progressing 04/07/2020 0702 by Sherre Lain, RN Outcome: Progressing Goal: Cardiovascular complication will be avoided 04/07/2020 0703 by Sherre Lain, RN Outcome: Progressing 04/07/2020 0702 by Sherre Lain, RN Outcome: Progressing   Problem: Activity: Goal: Risk for activity intolerance will decrease 04/07/2020 0703 by Sherre Lain, RN Outcome: Progressing 04/07/2020 0702 by Sherre Lain, RN Outcome: Progressing   Problem: Nutrition: Goal: Adequate nutrition will be maintained 04/07/2020 0703 by Sherre Lain, RN Outcome: Progressing 04/07/2020 0702 by Sherre Lain, RN Outcome: Progressing   Problem: Coping: Goal: Level of anxiety will decrease 04/07/2020  0703 by Sherre Lain, RN Outcome: Progressing 04/07/2020 0702 by Sherre Lain, RN Outcome: Progressing   Problem: Elimination: Goal: Will not experience complications related to bowel motility 04/07/2020 0703 by Sherre Lain, RN Outcome: Progressing 04/07/2020 0702 by Sherre Lain, RN Outcome: Progressing Goal: Will not experience complications related to urinary retention 04/07/2020 0703 by Sherre Lain, RN Outcome: Progressing 04/07/2020 0702 by Sherre Lain, RN Outcome: Progressing   Problem: Pain Managment: Goal: General experience of comfort will improve 04/07/2020 0703 by Sherre Lain, RN Outcome: Progressing 04/07/2020 0702 by Sherre Lain, RN Outcome: Progressing   Problem: Safety: Goal: Ability to remain free from injury will improve 04/07/2020 0703 by Sherre Lain, RN Outcome: Progressing 04/07/2020 0702 by Sherre Lain, RN Outcome: Progressing   Problem: Skin Integrity: Goal: Risk for impaired skin integrity will decrease 04/07/2020 0703 by Sherre Lain, RN Outcome: Progressing 04/07/2020 0702 by Sherre Lain, RN Outcome: Progressing

## 2020-04-08 ENCOUNTER — Inpatient Hospital Stay (HOSPITAL_COMMUNITY): Payer: Medicare Other

## 2020-04-08 DIAGNOSIS — D62 Acute posthemorrhagic anemia: Secondary | ICD-10-CM | POA: Diagnosis not present

## 2020-04-08 DIAGNOSIS — K922 Gastrointestinal hemorrhage, unspecified: Secondary | ICD-10-CM | POA: Diagnosis not present

## 2020-04-08 DIAGNOSIS — N179 Acute kidney failure, unspecified: Secondary | ICD-10-CM | POA: Diagnosis not present

## 2020-04-08 DIAGNOSIS — I5031 Acute diastolic (congestive) heart failure: Secondary | ICD-10-CM | POA: Diagnosis not present

## 2020-04-08 HISTORY — PX: IR THORACENTESIS ASP PLEURAL SPACE W/IMG GUIDE: IMG5380

## 2020-04-08 LAB — BODY FLUID CELL COUNT WITH DIFFERENTIAL
Lymphs, Fluid: 79 %
Monocyte-Macrophage-Serous Fluid: 19 % — ABNORMAL LOW (ref 50–90)
Neutrophil Count, Fluid: 2 % (ref 0–25)
Total Nucleated Cell Count, Fluid: 193 cu mm (ref 0–1000)

## 2020-04-08 LAB — URINALYSIS, ROUTINE W REFLEX MICROSCOPIC
Bilirubin Urine: NEGATIVE
Glucose, UA: NEGATIVE mg/dL
Hgb urine dipstick: NEGATIVE
Ketones, ur: NEGATIVE mg/dL
Nitrite: POSITIVE — AB
Protein, ur: 100 mg/dL — AB
Specific Gravity, Urine: 1.011 (ref 1.005–1.030)
pH: 7 (ref 5.0–8.0)

## 2020-04-08 LAB — PROTEIN, PLEURAL OR PERITONEAL FLUID: Total protein, fluid: <3 g/dL

## 2020-04-08 LAB — BRAIN NATRIURETIC PEPTIDE: B Natriuretic Peptide: 1426.1 pg/mL — ABNORMAL HIGH (ref 0.0–100.0)

## 2020-04-08 LAB — GLUCOSE, PLEURAL OR PERITONEAL FLUID: Glucose, Fluid: 164 mg/dL

## 2020-04-08 LAB — AMYLASE, PLEURAL OR PERITONEAL FLUID: Amylase, Fluid: 27 U/L

## 2020-04-08 LAB — RENAL FUNCTION PANEL
Albumin: 2.5 g/dL — ABNORMAL LOW (ref 3.5–5.0)
Anion gap: 10 (ref 5–15)
BUN: 66 mg/dL — ABNORMAL HIGH (ref 8–23)
CO2: 17 mmol/L — ABNORMAL LOW (ref 22–32)
Calcium: 8.1 mg/dL — ABNORMAL LOW (ref 8.9–10.3)
Chloride: 109 mmol/L (ref 98–111)
Creatinine, Ser: 3.21 mg/dL — ABNORMAL HIGH (ref 0.61–1.24)
GFR, Estimated: 18 mL/min — ABNORMAL LOW (ref >60)
Glucose, Bld: 165 mg/dL — ABNORMAL HIGH (ref 70–99)
Phosphorus: 4.8 mg/dL — ABNORMAL HIGH (ref 2.5–4.6)
Potassium: 4.4 mmol/L (ref 3.5–5.1)
Sodium: 136 mmol/L (ref 135–145)

## 2020-04-08 LAB — GRAM STAIN

## 2020-04-08 LAB — CBC
HCT: 27.3 % — ABNORMAL LOW (ref 39.0–52.0)
Hemoglobin: 8.9 g/dL — ABNORMAL LOW (ref 13.0–17.0)
MCH: 30.4 pg (ref 26.0–34.0)
MCHC: 32.6 g/dL (ref 30.0–36.0)
MCV: 93.2 fL (ref 80.0–100.0)
Platelets: 336 10*3/uL (ref 150–400)
RBC: 2.93 MIL/uL — ABNORMAL LOW (ref 4.22–5.81)
RDW: 16.7 % — ABNORMAL HIGH (ref 11.5–15.5)
WBC: 9.4 10*3/uL (ref 4.0–10.5)
nRBC: 0.3 % — ABNORMAL HIGH (ref 0.0–0.2)

## 2020-04-08 MED ORDER — LIDOCAINE HCL (PF) 1 % IJ SOLN
INTRAMUSCULAR | Status: AC | PRN
  Administered 2020-04-08: 10 mL

## 2020-04-08 MED ORDER — AMOXICILLIN-POT CLAVULANATE 500-125 MG PO TABS
1.0000 | ORAL_TABLET | Freq: Three times a day (TID) | ORAL | Status: AC
  Administered 2020-04-08 – 2020-04-09 (×3): 500 mg via ORAL
  Filled 2020-04-08 (×3): qty 1

## 2020-04-08 MED ORDER — SODIUM BICARBONATE 650 MG PO TABS: 650.0000 mg | ORAL_TABLET | Freq: Two times a day (BID) | ORAL | Status: DC

## 2020-04-08 MED ORDER — SODIUM BICARBONATE 650 MG PO TABS
1300.0000 mg | ORAL_TABLET | Freq: Two times a day (BID) | ORAL | Status: DC
  Administered 2020-04-08: 1300 mg via ORAL
  Filled 2020-04-08: qty 2

## 2020-04-08 MED ORDER — SACCHAROMYCES BOULARDII 250 MG PO CAPS
250.0000 mg | ORAL_CAPSULE | Freq: Two times a day (BID) | ORAL | Status: DC
  Administered 2020-04-08 – 2020-04-18 (×21): 250 mg via ORAL
  Filled 2020-04-08 (×20): qty 1

## 2020-04-08 MED ORDER — FUROSEMIDE 10 MG/ML IJ SOLN
40.0000 mg | Freq: Every day | INTRAMUSCULAR | Status: DC
  Filled 2020-04-08: qty 4

## 2020-04-08 MED ORDER — LIDOCAINE HCL 1 % IJ SOLN
INTRAMUSCULAR | Status: AC
  Filled 2020-04-08: qty 20

## 2020-04-08 MED ORDER — FUROSEMIDE 10 MG/ML IJ SOLN: 40.0000 mg | Freq: Two times a day (BID) | INTRAMUSCULAR | Status: DC

## 2020-04-08 NOTE — Progress Notes (Signed)
Physical Therapy Treatment Patient Details Name: Brian Wolf MRN: [id number] DOB: 07/14/1931 Today's Date: 04/08/2020    History of Present Illness Pt is an 87 y/o male admitted 3/5 secondary to dark stools; thought to be secondary to GI bleed. Pt is s/p EGD on 3/6 that showed large duodenal ulcer. Since admission, pt has developed wheezing as well. Thought to be secondary to CHF and possible aspiration PNA. Pt is s/p thoracentesis on 3/8. PMH includes HTN, CVA, CKD, and OSA on CPAP.    PT Comments    Pt progressing towards goals, however, very agitated this session. Extensive education provided to pt and pt's wife about importance of mobility and consequences of not getting out of bed. Pt cursing at PT/OT and called PT/OT "murderers" for making him get out of bed. Was able to ambulate around the room with min guard A +2. Shortness of breath seemed to have improved from previous session. Current recommendations appropriate and encouraged participation at home with PT/OT to prevent further deconditioning. Will continue to follow acutely.     Follow Up Recommendations  Home health PT;Supervision for mobility/OOB     Equipment Recommendations  Other (comment) (rollator (4 wheeled walker with seat))    Recommendations for Other Services       Precautions / Restrictions Precautions Precautions: Fall Restrictions Weight Bearing Restrictions: No    Mobility  Bed Mobility Overal bed mobility: Needs Assistance Bed Mobility: Supine to Sit     Supine to sit: Min assist;+2 for safety/equipment Sit to supine: Min guard   General bed mobility comments: Min A for trunk elevation to come to sitting. Increased time required. Pt cursing at PT/OT upon sitting.    Transfers Overall transfer level: Needs assistance Equipment used: None Transfers: Sit to/from Stand Sit to Stand: Min guard;+2 safety/equipment         General transfer comment: Min guard for  safety.  Ambulation/Gait Ambulation/Gait assistance: Min guard;+2 safety/equipment Gait Distance (Feet): 30 Feet Assistive device: None Gait Pattern/deviations: Step-through pattern;Decreased stride length;Shuffle Gait velocity: Decreased   General Gait Details: Short, shuffled type steps. Ambulated within the room. No LOB noted. Educated about importance of OOB mobility and gait, especially when he gets home.   Stairs             Wheelchair Mobility    Modified Rankin (Stroke Patients Only)       Balance Overall balance assessment: Needs assistance Sitting-balance support: No upper extremity supported;Feet supported Sitting balance-[name]: Fair     Standing balance support: No upper extremity supported;During functional activity Standing balance-[name]: Fair                              Cognition Arousal/Alertness: Awake/alert Behavior During Therapy: Agitated Overall Cognitive Status: Impaired/Different from baseline Area of Impairment: Awareness;Safety/judgement;Problem solving;Following commands                       Following Commands: Follows one step commands with increased time Safety/Judgement: Decreased awareness of deficits;Decreased awareness of safety Awareness: Emergent Problem Solving: Slow processing General Comments: Pt very agitated with PT/OT this session. Cursing throughout and called PT/OT "murderers" for getting him out of bed. Educated importance of getting out of bed to help with recovery. Wife present during education.      Exercises      General Comments General comments (skin integrity, edema, etc.): Pt's wife present during session.  Pertinent Vitals/Pain Pain Assessment: No/denies pain    Home Living                      Prior Function            PT Goals (current goals can now be found in the care plan section) Acute Rehab PT Goals Patient Stated Goal: to be left alone PT  Goal Formulation: With patient Time For Goal Achievement: 04/21/20 Potential to Achieve Goals: Fair Progress towards PT goals: Progressing toward goals    Frequency    Min 3X/week      PT Plan Current plan remains appropriate    Co-evaluation PT/OT/SLP Co-Evaluation/Treatment: Yes Reason for Co-Treatment: Necessary to address cognition/behavior during functional activity;For patient/therapist safety PT goals addressed during session: Mobility/safety with mobility;Balance        AM-PAC PT "6 Clicks" Mobility   Outcome Measure  Help needed turning from your back to your side while in a flat bed without using bedrails?: None Help needed moving from lying on your back to sitting on the side of a flat bed without using bedrails?: A Little Help needed moving to and from a bed to a chair (including a wheelchair)?: A Little Help needed standing up from a chair using your arms (e.g., wheelchair or bedside chair)?: A Little Help needed to walk in hospital room?: A Little Help needed climbing 3-5 steps with a railing? : A Little 6 Click Score: 19    End of Session Equipment Utilized During Treatment: Gait belt Activity Tolerance: Treatment limited secondary to agitation Patient left: in chair;with call bell/phone within reach;with chair alarm set;with family/visitor present Nurse Communication: Mobility status PT Visit Diagnosis: Other abnormalities of gait and mobility (R26.89);Difficulty in walking, not elsewhere classified (R26.2);Muscle weakness (generalized) (M62.81)     Time: 8264-1583 PT Time Calculation (min) (ACUTE ONLY): 23 min  Charges:  $Gait Training: 8-22 mins                     Reuel Derby, PT, DPT  Acute Rehabilitation Services  Pager: [phone] Office: [phone]    Rudean Hitt 04/08/2020, 12:50 PM

## 2020-04-08 NOTE — Procedures (Signed)
PROCEDURE SUMMARY:  Successful US guided right thoracentesis. Yielded 1.25 L of clear yellow fluid. Pt tolerated procedure well. No immediate complications.  Specimen was sent for labs. CXR ordered.  EBL < 5 mL  Ascencion Dike PA-C 04/08/2020 9:28 AM

## 2020-04-08 NOTE — Plan of Care (Addendum)
Received Pt back to unit in bed from IR. Pt alert, uncooperative, wife at bedside. Pt not in distress, due meds given.   Problem: Health Behavior/Discharge Planning: Goal: Ability to manage health-related needs will improve Outcome: Progressing   Problem: Clinical Measurements: Goal: Ability to maintain clinical measurements within normal limits will improve Outcome: Progressing   Problem: Nutrition: Goal: Adequate nutrition will be maintained Outcome: Progressing   Problem: Coping: Goal: Level of anxiety will decrease Outcome: Progressing   Problem: Elimination: Goal: Will not experience complications related to bowel motility Outcome: Progressing   Problem: Pain Managment: Goal: General experience of comfort will improve Outcome: Progressing   Problem: Safety: Goal: Ability to remain free from injury will improve Outcome: Progressing   Problem: Skin Integrity: Goal: Risk for impaired skin integrity will decrease Outcome: Progressing

## 2020-04-08 NOTE — Progress Notes (Signed)
Patient refusing CPAP at night.

## 2020-04-08 NOTE — Progress Notes (Signed)
SLP Cancellation Note  Patient Details Name: Horst Ostermiller MRN: [id number] DOB: January 27, 1932   Cancelled treatment:       Reason Eval/Treat Not Completed: Patient at procedure or test/unavailable   DeBlois, Katherene Ponto 04/08/2020, 8:47 AM

## 2020-04-08 NOTE — Consult Note (Addendum)
Nephrology Consult   Requesting provider: Irwin Brakeman Service requesting consult: Fam Med Reason for consult: AKI on CKD 3b   Assessment/Recommendations: Brian Wolf is a/an 87 y.o. male with a past medical history HTN, CVA, CKD, GI bleeding, OSA who present w/ weakness and GI bleed now w/ AKI on CKD  Non-Oliguric AKI on CKD, unclear stage 3b/IV: Likely baseline arterionephrosclerosis but unclear what his baseline GFR is.  May be beneficial to access outpatient records.  Most likely around 1.5-2 but could be higher.  Creatinine on arrival was 2.6 and has increased to 3.2.  Unclear cause at this time and needs further work-up -Obtain renal ultrasound, urinalysis, urine sodium -He is being diuresed by cardiology.  He does not appear extensively volume overloaded to me.  His echocardiogram demonstrated respiratory variability to his IVC without an elevated right atrial pressure.  I would suggest holding diuretics for today as we monitor his kidney function.  I think his respiratory status is more largely influenced by aspiration pneumonia with pneumonitis and reactive airway disease. Could reinitiate diuretics once creatinine is more stable -Continue to monitor daily Cr, Dose meds for GFR -Monitor Daily I/Os, Daily weight  -Maintain MAP>65 for optimal renal perfusion.  -Avoid nephrotoxic medications including NSAIDs and Vanc/Zosyn combo -Currently no indication for HD  Hypertension: Continue current medications as prescribed.  Blood pressure acceptable  Aspiration pneumonitis/pneumonia: Possible infection on antibiotics per primary team  Acute hypoxic respiratory failure: Likely multifactorial with pleural effusion and possible aspiration pneumonitis with reactive airway disease contributing.  Responded to nebs per notes.  I do not think he has a lot a pulmonary edema.  Holding diuretics as above given his worsening renal failure  Metabolic acidosis: Likely associated with AKI.  Start  oral bicarbonate  Anemia/GI bleed: Hemoglobin 8.9.  Likely multifactorial with recent GI bleed main contributor. Medical management and continue to monitor   Recommendations conveyed to primary service.    [facility] 04/08/2020 2:52 PM   _____________________________________________________________________________________ CC: AKI on CKD  History of Present Illness: Brian Wolf is a/an 87 y.o. male with a past medical history of HTN, CVA, CKD, GI bleeding, OSA who present w/ weakness and GI bleed.  Patient was very tired so most of the history was obtained from his wife.  Patient initially presented on 3/5 due to weakness at home as well as dark stools.  The patient's wife states that the patient will not often let her look at his stools but she noticed he was more tired and also coughing up more phlegm.  The patient was brought to the hospital and noted to have anemia.  He was taken for EGD and found to have a large duodenal ulcer that was nonbleeding.  It was recommended that the patient undergo medical management.  His hemoglobin has now been more stable.  The patient was also noted to have possible aspiration on chest x-ray as well as pleural effusion for which he underwent thoracentesis today.  Patient was also noted to have newly decreased ejection fraction of 35 to 40%.  Cardiology has been following during this hospitalization.  He has essential hypertension and has been receiving blood pressure medications.  He previously was on spironolactone and losartan which have been held during this hospitalization.  He has had some wheezing, coarse breath sounds, respiratory distress that has been treated with nebulizations and helped with the symptoms.  His wife states that he is breathing better since his thoracentesis.  Cardiology  has been diuresing the patient.  The patient's wife states that she is never heard about kidney dysfunction for her husband.   They have never seen a kidney doctor.  Based on previous hospitalization in 2021 it was felt that he had significant CKD.  I do not have access to his primary care doctor records to confirm his most recent GFR.   Medications:  Current Facility-Administered Medications  Medication Dose Route Frequency Provider Last Rate Last Admin  . amoxicillin-clavulanate (AUGMENTIN) 500-125 MG per tablet 500 mg  1 tablet Oral TID Johnson, Clanford L, MD      . furosemide (LASIX) injection 40 mg  40 mg Intravenous BID Cantwell, Celeste C, PA-C      . hydrALAZINE (APRESOLINE) tablet 50 mg  50 mg Oral TID Ronald Lobo, MD   50 mg at 04/08/20 1026  . ipratropium-albuterol (DUONEB) 0.5-2.5 (3) MG/3ML nebulizer solution 3 mL  3 mL Nebulization Q6H PRN Lovey Newcomer T, NP   3 mL at 04/07/20 0617  . isosorbide dinitrate (ISORDIL) tablet 30 mg  30 mg Oral TID Ronald Lobo, MD   30 mg at 04/08/20 1027  . latanoprost (XALATAN) 0.005 % ophthalmic solution 1 drop  1 drop Both Eyes QHS Johnson, Clanford L, MD   1 drop at 04/07/20 2124  . lidocaine (XYLOCAINE) 1 % (with pres) injection           . lidocaine (XYLOCAINE) 1 % (with pres) injection           . loratadine (CLARITIN) tablet 10 mg  10 mg Oral Daily PRN Johnson, Clanford L, MD      . MEDLINE mouth rinse  15 mL Mouth Rinse BID Johnson, Clanford L, MD      . metoprolol tartrate (LOPRESSOR) tablet 50 mg  50 mg Oral BID Ronald Lobo, MD   50 mg at 04/08/20 1028  . pantoprazole (PROTONIX) EC tablet 40 mg  40 mg Oral BID AC Buccini, Robert, MD   40 mg at 04/08/20 1026  . saccharomyces boulardii (FLORASTOR) capsule 250 mg  250 mg Oral BID Johnson, Clanford L, MD      . simvastatin (ZOCOR) tablet 40 mg  40 mg Oral Daily Buccini, [name], MD   40 mg at 04/07/20 1053  . sodium chloride flush (NS) 0.9 % injection 3 mL  3 mL Intravenous Q12H Buccini, Robert, MD   3 mL at 04/08/20 1032  . sucralfate (CARAFATE) 1 GM/10ML suspension 1 g  1 g Oral TID WC & HS Buccini,  Robert, MD   1 g at 04/08/20 1233  . tamsulosin (FLOMAX) capsule 0.4 mg  0.4 mg Oral Daily Johnson, Clanford L, MD   0.4 mg at 04/07/20 1053     ALLERGIES Patient has no known allergies.  MEDICAL HISTORY Past Medical History:  Diagnosis Date  . CVA (cerebral infarction) 12/2013  . Dementia ([place])   . Gastric ulcer    prior to 2000  . Hypertension   . Sleep difficulties      SOCIAL HISTORY Social History   Socioeconomic History  . Marital status: Married    Spouse name: [name]  . Number of children: 2  . Years of education: college  . Highest education level: Not on file  Occupational History    Comment: retired  Tobacco Use  . Smoking status: Never Smoker  . Smokeless tobacco: Never Used  Vaping Use  . Vaping Use: Never used  Substance and Sexual Activity  . Alcohol use: No  Alcohol/week: 0.0 standard drinks  . Drug use: No  . Sexual activity: Not on file  Other Topics Concern  . Not on file  Social History Narrative   Patient lives at home with his wife [name].   Retired.   Education Retail buyer.   Right handed.   Caffeine two cups of coffee daily.   Social Determinants of Health   Financial Resource Strain: Not on file  Food Insecurity: Not on file  Transportation Needs: Not on file  Physical Activity: Not on file  Stress: Not on file  Social Connections: Not on file  Intimate Partner Violence: Not on file     FAMILY HISTORY Family History  Problem Relation Age of Onset  . Hyperlipidemia Mother   . Hypertension Mother   . Hypertension Father       Review of Systems: 12 systems reviewed Otherwise as per HPI, all other systems reviewed and negative  Physical Exam: Vitals:   04/07/20 2044 04/08/20 0842  BP: (!) 132/57 (!) 139/59  Pulse: 66 62  Resp: 15 17  Temp: (!) 97.4 F (36.3 C) (!) 97.5 F (36.4 C)  SpO2: 98% 96%   Total I/O In: 3 [I.V.:3] Out: -   Intake/Output Summary (Last 24 hours) at 04/08/2020 1452 Last data filed at  04/08/2020 1032 Gross per 24 hour  Intake 3 ml  Output 800 ml  Net -797 ml   General: Chronically ill-appearing, sitting in chair, resting comfortably HEENT: anicteric sclera, dry mucous membranes CV: Normal rate, no audible murmurs, trace edema in ankles Lungs: Coarse upper airway sounds, diminished at the bases, normal respiratory effort, no wheezing, no definitive crackles Abd: soft, non-tender, non-distended Skin: no visible lesions or rashes Psych: Tired, awakes to answer questions Musculoskeletal: no obvious deformities Neuro: normal speech, no gross focal deficits but is lethargic  Test Results Reviewed Lab Results  Component Value Date   NA 136 04/08/2020   K 4.4 04/08/2020   CL 109 04/08/2020   CO2 17 (L) 04/08/2020   BUN 66 (H) 04/08/2020   CREATININE 3.21 (H) 04/08/2020   CALCIUM 8.1 (L) 04/08/2020   ALBUMIN 2.5 (L) 04/08/2020   PHOS 4.8 (H) 04/08/2020     I have reviewed all relevant outside healthcare records related to the patient's current hospitalization

## 2020-04-08 NOTE — Progress Notes (Signed)
PROGRESS NOTE   Brian Wolf  MGQ:[id number] DOB: [birth year] DOA: 04/05/2020 PCP: [name], DO   Chief Complaint  Patient presents with  . Rectal Bleeding   Level of care: Telemetry Medical  Brief Admission History:  y.o. male with medical history significant of HTN, CVA bradycardia 2/2 sinus node dysfunction, CKD stage IV, GI bleed with history of duodenal ulcer in 10/2019, and OSA on CPAP presents with complaints of having to dark stools, blood tinged stool water and generalized weakness.    Assessment & Plan:   Principal Problem:   Acute upper GI bleed Active Problems:   Essential hypertension   History of stroke   Sleep apnea   Acute kidney injury superimposed on chronic kidney disease (HCC)   Bilateral lower extremity edema   Acute blood loss anemia   Pleural effusion, right   Acute diastolic CHF (congestive heart failure) (HCC)   SOB (shortness of breath)  1. Acute upper GI bleed - Pt was taken for endoscopy by Dr. Cristina Gong with findings of a large duondenal ulcer which is thought source of bleed.  He remains on protonix now.  Sucralfate added by GI to use while inpatient only. Biopsy taken during endoscopy to be followed up by GI.   2. Stage IV CKD - family would like for him to establish care with Dr. Hollie Salk of [place].  Holding losartan for now given bump in creatinine and BUN.  Likely due to poor perfusion as a result of newly found cardiomyopathy. With worsening renal function requested inpatient renal consult.  3. Cardiomyopathy - newly found drop in EF to 35-40% from 2018 65-70%.   I have reached out to his cardiologist Dr. Einar Gip and requested inpatient consultation.  He is being diuresed by cardiology.   4. Aspiration - Continue antibiotics. I have asked for an SLP evaluation.   5. Acute respiratory distress - he responded well to steroids, nebs and supplemental oxygen.  6. LE edema - he was given IV lasix dose but no good diuretic response.  Repeat CXR stable.   7. SA node dysfunction - He is followed by Dr. Einar Gip and Dr. Curt Bears.   8. Essential hypertension - he was restarted on metoprolol, nifedipine, isosorbide, hydralazine.  Held home spironolactone, losartan and due to AKI on CKD stage IV.  9. Right pleural effusion - s/p thoracentesis - 1.2L removed.  Fluid studies sent.  10. History of CVA - stable.  Had not been taking aspirin per GI prior to admission.  11. BPH - stable on flomax.  Cath PRN if unable to void.   12. OSA - CPAP ordered.    DVT prophylaxis: SCD Code Status: Full  Family Communication: wife at bedside, she requested I not call and speak with son today 04/08/20 Disposition: Home  Status is: Inpatient  Remains inpatient appropriate because:IV treatments appropriate due to intensity of illness or inability to take PO and Inpatient level of care appropriate due to severity of illness  Dispo: The patient is from: Home              Anticipated d/c is to: Home              Patient currently is not medically stable to d/c.   Difficult to place patient No   Consultants:   GI Sadie Haber)  Procedures:   EGD 04/06/20  Antimicrobials:     Subjective: Pt very weak.  Having wheezing in chest.  No chest pain.     Objective: Vitals:  04/07/20 1030 04/07/20 1608 04/07/20 2044 04/08/20 0842  BP: (!) 142/65 (!) 111/47 (!) 132/57 (!) 139/59  Pulse: 73 66 66 62  Resp: (!) 22 20 15 17   Temp: 98.4 F (36.9 C) 98.4 F (36.9 C) (!) 97.4 F (36.3 C) (!) 97.5 F (36.4 C)  TempSrc: Oral Oral Oral Oral  SpO2: 98% 93% 98% 96%  Weight:      Height:        Intake/Output Summary (Last 24 hours) at 04/08/2020 1240 Last data filed at 04/08/2020 1032 Gross per 24 hour  Intake 123 ml  Output 800 ml  Net -677 ml   Filed Weights   04/05/20 0641  Weight: 69.4 kg    Examination:  General exam: frail, elderly male, awake, alert,  Appears calm and comfortable but irritable.   Respiratory system: anterior expiratory wheezing heard.   Respiratory effort normal. Cardiovascular system: normal S1 & S2 heard. No JVD, murmurs, rubs, gallops or clicks.  No pedal edema. Gastrointestinal system: Abdomen is nondistended, soft and nontender. No organomegaly or masses felt. Normal bowel sounds heard. Central nervous system: Alert and oriented. No focal neurological deficits. Extremities: osteoarthritic changes.  Symmetric 5 x 5 power. Skin: No rashes, lesions or ulcers Psychiatry: Judgement and insight appear normal. Mood & affect irritable.    Data Reviewed: I have personally reviewed following labs and imaging studies  CBC: Recent Labs  Lab 04/05/20 0729 04/05/20 1604 04/06/20 0120 04/06/20 0642 04/06/20 1610 04/07/20 0252 04/08/20 0144  WBC 10.4  --   --  10.7* 11.6* 11.0* 9.4  NEUTROABS 6.9  --   --   --   --   --   --   HGB 6.7*   < > 8.5* 8.7* 9.2* 8.4* 8.9*  HCT 21.3*   < > 25.3* 26.2* 28.2* 26.2* 27.3*  MCV 96.4  --   --  91.3 92.5 93.6 93.2  PLT 251  --   --  272 302 300 336   < > = values in this interval not displayed.    Basic Metabolic Panel: Recent Labs  Lab 04/05/20 0729 04/06/20 0642 04/07/20 0252 04/08/20 0144  NA 136 140 139 136  K 5.0 4.5 4.3 4.4  CL 107 110 109 109  CO2 19* 17* 20* 17*  GLUCOSE 105* 83 122* 165*  BUN 72* 69* 65* 66*  CREATININE 2.63* 2.77* 2.96* 3.21*  CALCIUM 7.9* 8.1* 8.1* 8.1*  PHOS  --   --  5.1* 4.8*    GFR: Estimated Creatinine Clearance: 14.4 mL/min (A) (by C-G formula based on SCr of 3.21 mg/dL (H)).  Liver Function Tests: Recent Labs  Lab 04/05/20 0729 04/07/20 0252 04/08/20 0144  AST 22  --   --   ALT 14  --   --   ALKPHOS 41  --   --   BILITOT 0.7  --   --   PROT 4.8*  --   --   ALBUMIN 2.2* 2.3* 2.5*    CBG: No results for input(s): GLUCAP in the last 168 hours.  Recent Results (from the past 240 hour(s))  Resp Panel by RT-PCR (Flu A&B, Covid) Nasopharyngeal Swab     Status: None   Collection Time: 04/05/20  7:03 AM   Specimen:  Nasopharyngeal Swab; Nasopharyngeal(NP) swabs in vial transport medium  Result Value Ref Range Status   SARS Coronavirus 2 by RT PCR NEGATIVE NEGATIVE Final    Comment: (NOTE) SARS-CoV-2 target nucleic acids are NOT DETECTED.  The  SARS-CoV-2 RNA is generally detectable in upper respiratory specimens during the acute phase of infection. The lowest concentration of SARS-CoV-2 viral copies this assay can detect is 138 copies/mL. A negative result does not preclude SARS-Cov-2 infection and should not be used as the sole basis for treatment or other patient management decisions. A negative result may occur with  improper specimen collection/handling, submission of specimen other than nasopharyngeal swab, presence of viral mutation(s) within the areas targeted by this assay, and inadequate number of viral copies(<138 copies/mL). A negative result must be combined with clinical observations, patient history, and epidemiological information. The expected result is Negative.  Fact Sheet for Patients:  EntrepreneurPulse.com.au  Fact Sheet for Healthcare Providers:  IncredibleEmployment.be  This test is no t yet approved or cleared by the [place] FDA and  has been authorized for detection and/or diagnosis of SARS-CoV-2 by FDA under an Emergency Use Authorization (EUA). This EUA will remain  in effect (meaning this test can be used) for the duration of the COVID-19 declaration under Section 564(b)(1) of the Act, 21 U.S.C.section 360bbb-3(b)(1), unless the authorization is terminated  or revoked sooner.       Influenza A by PCR NEGATIVE NEGATIVE Final   Influenza B by PCR NEGATIVE NEGATIVE Final    Comment: (NOTE) The Xpert Xpress SARS-CoV-2/FLU/RSV plus assay is intended as an aid in the diagnosis of influenza from Nasopharyngeal swab specimens and should not be used as a sole basis for treatment. Nasal washings and aspirates are unacceptable for  Xpert Xpress SARS-CoV-2/FLU/RSV testing.  Fact Sheet for Patients: EntrepreneurPulse.com.au  Fact Sheet for Healthcare Providers: IncredibleEmployment.be  This test is not yet approved or cleared by the [place] FDA and has been authorized for detection and/or diagnosis of SARS-CoV-2 by FDA under an Emergency Use Authorization (EUA). This EUA will remain in effect (meaning this test can be used) for the duration of the COVID-19 declaration under Section 564(b)(1) of the Act, 21 U.S.C. section 360bbb-3(b)(1), unless the authorization is terminated or revoked.  Performed at [facility], [place] [address], [place], [place] [address]   Gram stain     Status: None   Collection Time: 04/08/20  9:38 AM   Specimen: Pleura  Result Value Ref Range Status   Specimen Description PLEURAL FLUID  Final   Special Requests RIGHT LUNG  Final   Gram Stain   Final    WBC PRESENT,BOTH PMN AND MONONUCLEAR NO ORGANISMS SEEN CYTOSPIN SMEAR Performed at [facility], [address], [place], [place] [address]    Report Status 04/08/2020 FINAL  Final     Radiology Studies: DG Chest 1 View  Result Date: 04/08/2020 CLINICAL DATA:  Post right-sided thoracentesis EXAM: CHEST  1 VIEW COMPARISON:  04/07/2020; 04/06/2020; 04/05/2020 FINDINGS: Grossly unchanged enlarged cardiac silhouette and mediastinal contours with atherosclerotic plaque thoracic aorta. There is persistent thickening the right paratracheal stripe presumably secondary prominent vasculature. Interval reduction in persistent small right-sided effusion post thoracentesis. No pneumothorax. Improved aeration the right lung base with persistent right basilar heterogeneous/consolidative opacities. Unchanged trace left-sided effusion associated left basilar heterogeneous opacities. Pulmonary vasculature remains indistinct with cephalization of flow. No acute osseous abnormalities. IMPRESSION: 1.  Interval reduction in persistent small right-sided effusion post thoracentesis. No pneumothorax. 2. Improved aeration the right lung base with persistent bibasilar opacities, atelectasis versus infiltrate. 3. Otherwise, similar findings of pulmonary edema and trace left-sided pleural effusion. Electronically Signed   By: Sandi Mariscal M.D.   On: 04/08/2020 09:50  DG Chest Port 1 View  Result Date: 04/07/2020 CLINICAL DATA:  Follow-up effusions and atelectasis versus pneumonia. EXAM: PORTABLE CHEST 1 VIEW COMPARISON:  04/06/2020 and earlier. FINDINGS: Cardiac silhouette mildly enlarged, unchanged. Pulmonary vascularity normal without evidence of pulmonary edema. Large RIGHT pleural effusion with associated dense consolidation in the RIGHT MIDDLE LOBE and RIGHT LOWER LOBE, unchanged. Small LEFT pleural effusion with associated consolidation in the LEFT LOWER LOBE, unchanged. No new abnormalities. IMPRESSION: 1. Stable large RIGHT pleural effusion and associated dense atelectasis and/or pneumonia in the RIGHT MIDDLE LOBE and RIGHT LOWER LOBE. 2. Stable small LEFT pleural effusion and associated passive atelectasis and/or pneumonia in the LEFT LOWER LOBE. 3. No new abnormalities. Electronically Signed   By: Evangeline Dakin M.D.   On: 04/07/2020 16:43   DG CHEST PORT 1 VIEW  Result Date: 04/06/2020 CLINICAL DATA:  Dyspnea EXAM: PORTABLE CHEST 1 VIEW COMPARISON:  04/05/2020 FINDINGS: Large right pleural effusion is unchanged with compressive atelectasis of the right lung base and opacification of the right hemithorax. Small left pleural effusion is present with persistent retrocardiac atelectasis. Right paratracheal opacity is unchanged from multiple prior examinations likely representing vascular shadow. Cardiac size within normal limits. The pulmonary vascularity is normal. No acute bone abnormality. Multiple healed right rib fractures are noted. IMPRESSION: Stable large right and small left pleural effusions with  associated bibasilar compressive atelectasis. Electronically Signed   By: Fidela Salisbury MD   On: 04/06/2020 23:04   ECHOCARDIOGRAM COMPLETE  Result Date: 04/06/2020    ECHOCARDIOGRAM REPORT   Patient Name:   Brian Wolf Date of Exam: 04/06/2020 Medical Rec #:  [id number]         Height:       65.0 in Accession #:    [id number]        Weight:       153.0 lb Date of Birth:  Jun 21, 1931         BSA:          1.765 m Patient Age:    86 years          BP:           119/62 mmHg Patient Gender: M                 HR:           84 bpm. Exam Location:  Inpatient Procedure: 2D Echo, Cardiac Doppler and Color Doppler Indications:    I50.23 Acute on chronic systolic (congestive) heart failure  History:        Patient has prior history of Echocardiogram examinations, most                 recent 03/09/2016. Stroke; Risk Factors:Hypertension.  Sonographer:    Jonelle Sidle Dance Referring Phys: Magdalena  1. Left ventricular ejection fraction, by estimation, is 35 to 40%. The left ventricle has moderately decreased function. The left ventricle has no regional wall motion abnormalities. Left ventricular diastolic parameters are consistent with Grade I diastolic dysfunction (impaired relaxation).  2. Right ventricular systolic function is normal. The right ventricular size is normal. There is normal pulmonary artery systolic pressure.  3. The mitral valve is normal in structure. No evidence of mitral valve regurgitation. No evidence of mitral stenosis.  4. The aortic valve is tricuspid. Aortic valve regurgitation is mild. Mild to moderate aortic valve sclerosis/calcification is present, without any evidence of aortic stenosis.  5. The inferior vena cava is normal in size with  greater than 50% respiratory variability, suggesting right atrial pressure of 3 mmHg. FINDINGS  Left Ventricle: Left ventricular ejection fraction, by estimation, is 35 to 40%. The left ventricle has moderately decreased function. The left  ventricle has no regional wall motion abnormalities. The left ventricular internal cavity size was normal in size. There is no left ventricular hypertrophy. Left ventricular diastolic parameters are consistent with Grade I diastolic dysfunction (impaired relaxation).  LV Wall Scoring: The mid and distal anterior septum, apical lateral segment, mid inferoseptal segment, and apex are akinetic. Right Ventricle: The right ventricular size is normal. No increase in right ventricular wall thickness. Right ventricular systolic function is normal. There is normal pulmonary artery systolic pressure. The tricuspid regurgitant velocity is 2.42 m/s, and  with an assumed right atrial pressure of 3 mmHg, the estimated right ventricular systolic pressure is 86.7 mmHg. Left Atrium: Left atrial size was normal in size. Right Atrium: Right atrial size was normal in size. Pericardium: There is no evidence of pericardial effusion. Mitral Valve: The mitral valve is normal in structure. No evidence of mitral valve regurgitation. No evidence of mitral valve stenosis. Tricuspid Valve: The tricuspid valve is normal in structure. Tricuspid valve regurgitation is not demonstrated. No evidence of tricuspid stenosis. Aortic Valve: The aortic valve is tricuspid. Aortic valve regurgitation is mild. Aortic regurgitation PHT measures 440 msec. Mild to moderate aortic valve sclerosis/calcification is present, without any evidence of aortic stenosis. Pulmonic Valve: The pulmonic valve was normal in structure. Pulmonic valve regurgitation is not visualized. No evidence of pulmonic stenosis. Aorta: The aortic root is normal in size and structure. Venous: The inferior vena cava is normal in size with greater than 50% respiratory variability, suggesting right atrial pressure of 3 mmHg. IAS/Shunts: No atrial level shunt detected by color flow Doppler.  LEFT VENTRICLE PLAX 2D LVIDd:         4.50 cm LVIDs:         3.60 cm LV PW:         1.10 cm LV IVS:         1.10 cm LVOT diam:     2.10 cm LV SV:         72 LV SV Index:   41 LVOT Area:     3.46 cm  RIGHT VENTRICLE          IVC RV Basal diam:  2.80 cm  IVC diam: 1.40 cm TAPSE (M-mode): 1.6 cm LEFT ATRIUM             Index       RIGHT ATRIUM           Index LA diam:        4.50 cm 2.55 cm/m  RA Area:     11.50 cm LA Vol (A2C):   80.8 ml 45.77 ml/m RA Volume:   26.90 ml  15.24 ml/m LA Vol (A4C):   86.1 ml 48.77 ml/m LA Biplane Vol: 83.4 ml 47.24 ml/m  AORTIC VALVE LVOT Vmax:   95.00 cm/s LVOT Vmean:  60.300 cm/s LVOT VTI:    0.209 m AI PHT:      440 msec  AORTA Ao Root diam: 3.30 cm Ao Asc diam:  3.50 cm MV A velocity: 128.00 cm/s  TRICUSPID VALVE                             TR Peak grad:   23.4 mmHg  TR Vmax:        242.00 cm/s                              SHUNTS                             Systemic VTI:  0.21 m                             Systemic Diam: 2.10 cm Candee Furbish MD Electronically signed by Candee Furbish MD Signature Date/Time: 04/06/2020/3:01:25 PM    Final    IR THORACENTESIS ASP PLEURAL SPACE W/IMG GUIDE  Result Date: 04/08/2020 INDICATION: Shortness of breath. Large right pleural effusion. Request for diagnostic and therapeutic thoracentesis. EXAM: ULTRASOUND GUIDED RIGHT THORACENTESIS MEDICATIONS: 1% plain lidocaine, 5 mL COMPLICATIONS: None immediate. PROCEDURE: An ultrasound guided thoracentesis was thoroughly discussed with the patient and questions answered. The benefits, risks, alternatives and complications were also discussed. The patient understands and wishes to proceed with the procedure. Written consent was obtained. Ultrasound was performed to localize and mark an adequate pocket of fluid in the right chest. The area was then prepped and draped in the normal sterile fashion. 1% Lidocaine was used for local anesthesia. Under ultrasound guidance a 6 Fr Safe-T-Centesis catheter was introduced. Thoracentesis was performed. The catheter was removed and a dressing  applied. FINDINGS: A total of approximately 1.25 L of clear yellow fluid was removed. Samples were sent to the laboratory as requested by the clinical team. IMPRESSION: Successful ultrasound guided right thoracentesis yielding 1.25 L of pleural fluid. Read by: Ascencion Dike PA-C Electronically Signed   By: Sandi Mariscal M.D.   On: 04/08/2020 09:48   Scheduled Meds: . furosemide  40 mg Intravenous BID  . hydrALAZINE  50 mg Oral TID  . isosorbide dinitrate  30 mg Oral TID  . latanoprost  1 drop Both Eyes QHS  . lidocaine      . lidocaine      . mouth rinse  15 mL Mouth Rinse BID  . metoprolol tartrate  50 mg Oral BID  . pantoprazole  40 mg Oral BID AC  . simvastatin  40 mg Oral Daily  . sodium chloride flush  3 mL Intravenous Q12H  . sucralfate  1 g Oral TID WC & HS  . tamsulosin  0.4 mg Oral Daily   Continuous Infusions: . sodium chloride      LOS: 3 days   Time spent: 39 mins   Reygan Heagle Wynetta Emery, MD How to contact the [facility] Attending or Consulting provider [place] or covering provider during after hours [place], for this patient?  1. Check the care team in [facility] and look for a) attending/consulting [facility] provider listed and b) the [facility] team listed 2. Log into [email or web address] and use [place]'s universal password to access. If you do not have the password, please contact the hospital operator. 3. Locate the [facility] provider you are looking for under [facility] and page to a number that you can be directly reached. 4. If you still have difficulty reaching the provider, please page the [facility] (Director on Call) for the Hospitalists listed on amion for assistance.  04/08/2020, 12:40 PM

## 2020-04-08 NOTE — Evaluation (Signed)
Occupational Therapy Evaluation Patient Details Name: Brian Wolf MRN: [id number] DOB: January 02, 1932 Today's Date: 04/08/2020    History of Present Illness Pt is an 87 y/o male admitted 3/5 secondary to dark stools; thought to be secondary to GI bleed. Pt is s/p EGD on 3/6 that showed large duodenal ulcer. Since admission, pt has developed wheezing as well. Thought to be secondary to CHF and possible aspiration PNA. Pt is s/p thoracentesis on 3/8. PMH includes HTN, CVA, CKD, and OSA on CPAP.   Clinical Impression   Pt presents with decline in function and safety with ADLs and ADL mobility with impaired strength, balance and endurance. Pt very agitated this session. Extensive education provided to pt and pt's wife about importance of mobility and consequences of not getting out of bed. Pt cursing at PT/OT and called PT/OT "murderers" for making him get out of bed. PTA, pt lived at home with his wife and was Ind with ADLs and ADL mobility; pt currently requires mion A with LB ADLs and min guard A +2 for mobility, 2 person HHA. Pt would benefit (if cooperative) from acute OT services to address impairments to maximize level of function and safety    Follow Up Recommendations  Home health OT;Supervision - Intermittent    Equipment Recommendations  Other (comment) (RW)    Recommendations for Other Services       Precautions / Restrictions Precautions Precautions: Fall Restrictions Weight Bearing Restrictions: No      Mobility Bed Mobility Overal bed mobility: Needs Assistance Bed Mobility: Supine to Sit     Supine to sit: Min assist;+2 for safety/equipment Sit to supine: Min guard   General bed mobility comments: Min A for trunk elevation to come to sitting. Increased time required. Pt cursing at PT/OT upon sitting.    Transfers Overall transfer level: Needs assistance Equipment used: None;2 person hand held assist Transfers: Sit to/from Stand Sit to Stand: Min guard;+2  safety/equipment         General transfer comment: Min guard for safety.    Balance Overall balance assessment: Needs assistance Sitting-balance support: No upper extremity supported;Feet supported Sitting balance-[name]: Fair     Standing balance support: No upper extremity supported;During functional activity Standing balance-[name]: Fair                             ADL either performed or assessed with clinical judgement   ADL Overall ADL's : Needs assistance/impaired Eating/Feeding: Set up;Independent;Sitting   Grooming: Wash/dry hands;Wash/dry face;Standing;Min guard   Upper Body Bathing: Set up;Supervision/ safety;Sitting   Lower Body Bathing: With caregiver independent assisting;Minimal assistance;Min guard   Upper Body Dressing : Set up;Supervision/safety;Sitting   Lower Body Dressing: Minimal assistance;Min guard;With caregiver independent assisting   Toilet Transfer: Min guard;+2 for physical assistance;Ambulation;Cueing for safety   Toileting- Clothing Manipulation and Hygiene: Min guard;Sit to/from stand;With caregiver independent assisting       Functional mobility during ADLs: Min guard;+2 for physical assistance;Cueing for safety       Vision Patient Visual Report: No change from baseline       Perception     Praxis      Pertinent Vitals/Pain Pain Assessment: No/denies pain     Hand Dominance Right   Extremity/Trunk Assessment Upper Extremity Assessment Upper Extremity Assessment: Generalized weakness   Lower Extremity Assessment Lower Extremity Assessment: Defer to PT evaluation   Cervical / Trunk Assessment Cervical / Trunk Assessment: Kyphotic  Communication Communication Communication: No difficulties   Cognition Arousal/Alertness: Awake/alert Behavior During Therapy: Agitated Overall Cognitive Status: Impaired/Different from baseline Area of Impairment: Awareness;Safety/judgement;Problem  solving;Following commands                       Following Commands: Follows one step commands with increased time Safety/Judgement: Decreased awareness of deficits;Decreased awareness of safety Awareness: Emergent Problem Solving: Slow processing General Comments: Pt very agitated with PT/OT this session. Cursing throughout and called PT/OT "murderers" for getting him out of bed. Educated importance of getting out of bed to help with recovery. Wife present during education.   General Comments  Pt's wife present during session.    Exercises     Shoulder Instructions      Home Living Family/patient expects to be discharged to:: Private residence Living Arrangements: Spouse/significant other Available Help at Discharge: Family;Available 24 hours/day Type of Home: House Home Access: Ramped entrance     Home Layout: One level     Bathroom Shower/Tub: Walk-in shower;Tub/shower unit   Bathroom Toilet: Standard Bathroom Accessibility: Yes   Home Equipment: Shower seat;Grab bars - tub/shower;Tub bench;Walker - 2 wheels;Cane - single point          Prior Functioning/Environment Level of Independence: Independent        Comments: Pt was independent with ADLs, IADLs, and community ambulation with occasional use of RW.  No falls at home.        OT Problem List: Decreased strength;Impaired balance (sitting and/or standing);Decreased cognition;Decreased coordination;Decreased activity tolerance      OT Treatment/Interventions: Self-care/ADL training;Patient/family education;Balance training;Therapeutic activities;DME and/or AE instruction    OT Goals(Current goals can be found in the care plan section) Acute Rehab OT Goals Patient Stated Goal: to be left alone OT Goal Formulation: With patient/family Time For Goal Achievement: 04/22/20 Potential to Achieve Goals: Good ADL Goals Pt Will Perform Grooming: with supervision;with set-up;standing;with caregiver  independent in assisting Pt Will Perform Lower Body Bathing: with min guard assist;with supervision;with set-up;with caregiver independent in assisting Pt Will Perform Lower Body Dressing: with min guard assist;with supervision;with set-up;sit to/from stand Pt Will Transfer to Toilet: with min guard assist;with supervision;with modified independence Pt Will Perform Toileting - Clothing Manipulation and hygiene: with supervision;with modified independence;sit to/from stand;with caregiver independent in assisting  OT Frequency: Min 2X/week   Barriers to D/C:            Co-evaluation PT/OT/SLP Co-Evaluation/Treatment: Yes Reason for Co-Treatment: Complexity of the patient's impairments (multi-system involvement);For patient/therapist safety PT goals addressed during session: Mobility/safety with mobility;Balance OT goals addressed during session: ADL's and self-care      AM-PAC OT "6 Clicks" Daily Activity     Outcome Measure Help from another person eating meals?: None Help from another person taking care of personal grooming?: A Little Help from another person toileting, which includes using toliet, bedpan, or urinal?: A Little Help from another person bathing (including washing, rinsing, drying)?: A Little Help from another person to put on and taking off regular upper body clothing?: None Help from another person to put on and taking off regular lower body clothing?: A Little 6 Click Score: 20   End of Session Equipment Utilized During Treatment: Gait belt Nurse Communication: Mobility status  Activity Tolerance: Treatment limited secondary to agitation Patient left: in chair;with call bell/phone within reach;with chair alarm set;with family/visitor present  OT Visit Diagnosis: Unsteadiness on feet (R26.81);Muscle weakness (generalized) (M62.81)  Time: 8366-2947 OT Time Calculation (min): 23 min Charges:  OT General Charges $OT Visit: 1 Visit OT Evaluation $OT  Eval Moderate Complexity: 1 Mod    [name] 04/08/2020, 1:45 PM

## 2020-04-08 NOTE — Plan of Care (Signed)
Patient is s/p endoscopy by Dr. Cristina Gong on 3/6. Patient's Hgb is currently stable at 8.9. Patient does have some expiratory wheezing that are audible without a stethoscope. Solumedrol, albuterol treatments, and Lasix are given as ordered. Patient is not having any s/sx of distress or difficulty breathing. Will continue to monitor and continue current POC.

## 2020-04-08 NOTE — Progress Notes (Addendum)
Subjective:  Patient resting comfortably in bed. He is able to be aroused, however he is somnolent and difficult to obtain HPI as patient repeatedly fell asleep. He denies chest pain.  Repeat BMP revealed creatinine increased from 3.962 3.21 and GFR down from 2218. BNP increased from 203 (04/05/2020) to 1426 (04/08/2020) Patient net -1.6 L since admission.  Intake/Output from previous day:  I/O last 3 completed shifts: In: 303 [P.O.:300; I.V.:3] Out: 1400 [Urine:1400] Total I/O In: 3 [I.V.:3] Out: -   Blood pressure (!) 139/59, pulse 62, temperature (!) 97.5 F (36.4 C), temperature source Oral, resp. rate 17, height _0  (1.651 m), weight 69.4 kg, SpO2 96 %. Physical Exam Vitals and nursing note reviewed.  Constitutional:      General: He is sleeping. He is not in acute distress.    Appearance: Normal appearance. He is obese. He is not toxic-appearing.  HENT:     Head: Normocephalic and atraumatic.     Nose: Nose normal.     Mouth/Throat:     Mouth: Mucous membranes are moist.  Eyes:     Extraocular Movements: Extraocular movements intact.     Conjunctiva/sclera: Conjunctivae normal.  Neck:     Vascular: No carotid bruit or JVD.  Cardiovascular:     Rate and Rhythm: Normal rate and regular rhythm.     Pulses: Intact distal pulses.     Heart sounds: S1 normal and S2 normal. No murmur heard. No gallop.   Pulmonary:     Effort: Pulmonary effort is normal. No respiratory distress.     Breath sounds: Wheezing and rhonchi present. No rales.     Comments: Wheezing and rhonchi bilaterally throughout. Expiratory wheezes heard without stethoscope.  No supplemental oxygen at this time, previously on [place] 2L/min.  Abdominal:     General: Bowel sounds are normal. There is no distension.     Palpations: Abdomen is soft.  Musculoskeletal:     Cervical back: Neck supple.     Right lower leg: Edema (trace) present.     Left lower leg: Edema (trace) present.  Skin:    General: Skin is  warm and dry.  Neurological:     Mental Status: He is easily aroused. He is lethargic.     Lab Results: BMP BNP (last 3 results) Recent Labs    04/05/20 1604 04/08/20 0144  BNP 203.3* 1,426.1*    ProBNP (last 3 results) No results for input(s): PROBNP in the last 8760 hours. BMP Latest Ref Rng & Units 04/08/2020 04/07/2020 04/06/2020  Glucose 70 - 99 mg/dL 165(H) 122(H) 83  BUN 8 - 23 mg/dL 66(H) 65(H) 69(H)  Creatinine 0.61 - 1.24 mg/dL 3.21(H) 2.96(H) 2.77(H)  Sodium 135 - 145 mmol/L 136 139 140  Potassium 3.5 - 5.1 mmol/L 4.4 4.3 4.5  Chloride 98 - 111 mmol/L 109 109 110  CO2 22 - 32 mmol/L 17(L) 20(L) 17(L)  Calcium 8.9 - 10.3 mg/dL 8.1(L) 8.1(L) 8.1(L)   Hepatic Function Latest Ref Rng & Units 04/08/2020 04/07/2020 04/05/2020  Total Protein 6.5 - 8.1 g/dL - - 4.8(L)  Albumin 3.5 - 5.0 g/dL 2.5(L) 2.3(L) 2.2(L)  AST 15 - 41 U/L - - 22  ALT 0 - 44 U/L - - 14  Alk Phosphatase 38 - 126 U/L - - 41  Total Bilirubin 0.3 - 1.2 mg/dL - - 0.7   CBC Latest Ref Rng & Units 04/08/2020 04/07/2020 04/06/2020  WBC 4.0 - 10.5 K/uL 9.4 11.0(H) 11.6(H)  Hemoglobin 13.0 - 17.0  g/dL 8.9(L) 8.4(L) 9.2(L)  Hematocrit 39.0 - 52.0 % 27.3(L) 26.2(L) 28.2(L)  Platelets 150 - 400 K/uL 336 300 302   Lipid Panel     Component Value Date/Time   CHOL 147 12/15/2013 0342   TRIG 84 12/15/2013 0342   HDL 54 12/15/2013 0342   CHOLHDL 2.7 12/15/2013 0342   VLDL 17 12/15/2013 0342   LDLCALC 76 12/15/2013 0342   Cardiac Panel (last 3 results) No results for input(s): CKTOTAL, CKMB, TROPONINI, RELINDX in the last 72 hours.  HEMOGLOBIN A1C Lab Results  Component Value Date   HGBA1C 6.4 (H) 12/15/2013   MPG 137 (H) 12/15/2013   TSH Recent Labs    10/20/19 0706 04/05/20 1847  TSH 2.706 4.188   Imaging: DG Chest Port 04/07/2020: CLINICAL DATA: Follow-up effusions and atelectasis versus pneumonia. EXAM: PORTABLE CHEST 1 VIEW COMPARISON: 04/06/2020 and earlier. FINDINGS: Cardiac silhouette mildly  enlarged, unchanged. Pulmonary vascularity normal without evidence of pulmonary edema. Large RIGHT pleural effusion with associated dense consolidation in the RIGHT MIDDLE LOBE and RIGHT LOWER LOBE, unchanged. Small LEFT pleural effusion with associated consolidation in the LEFT LOWER LOBE, unchanged. No new abnormalities. IMPRESSION: 1. Stable large RIGHT pleural effusion and associated dense atelectasis and/or pneumonia in the RIGHT MIDDLE LOBE and RIGHT LOWER LOBE. 2. Stable small LEFT pleural effusion and associated passive atelectasis and/or pneumonia in the LEFT LOWER LOBE. 3. No new abnormalities. Electronically Signed By: Evangeline Dakin M.D. On: 04/07/2020 16:43   DG CHEST PORT 04/06/2020: CLINICAL DATA: Dyspnea EXAM: PORTABLE CHEST 1 VIEW COMPARISON: 04/05/2020 FINDINGS: Large right pleural effusion is unchanged with compressive atelectasis of the right lung base and opacification of the right hemithorax. Small left pleural effusion is present with persistent retrocardiac atelectasis. Right paratracheal opacity is unchanged from multiple prior examinations likely representing vascular shadow. Cardiac size within normal limits. The pulmonary vascularity is normal. No acute bone abnormality. Multiple healed right rib fractures are noted. IMPRESSION: Stable large right and small left pleural effusions with associated bibasilar compressive atelectasis. Electronically Signed By: Fidela Salisbury MD On: 04/06/2020 23:04   Cardiac Studies: Echocardiogram 03/09/2016:  Left ventricle: The cavity size was normal. Wall thickness was increased in a pattern of mild LVH. Systolic function was  vigorous. The estimated ejection fraction was in the range of 65% to 70%. Wall motion was normal; there were no regional wall  motion abnormalities. Doppler parameters are consistent with abnormal left ventricular relaxation (grade 1 diastolic dysfunction).  - Aortic valve: Trileaflet; mildly  thickened, mildly calcified leaflets.  - Left atrium: The atrium was mildly dilated  Long Term Monitor 11/02/2019: Max 139 bpm 09:19am, 09/18 Min 31 bpm 05:31am, 09/15 Avg 71 bpm 1.4% PACs 3.2% PVCs Predominant rhythm was sinus rhythm No prolonged arrhythmias noted Multiple episodes of complete AV block with a narrow QRS escape occurred between 5-5:30 AM  Echocardiogram 04/06/2020 1. Left ventricular ejection fraction, by estimation, is 35 to 40%. The  left ventricle has moderately decreased function. The left ventricle has  no regional wall motion abnormalities. Left ventricular diastolic  parameters are consistent with Grade I  diastolic dysfunction (impaired relaxation).  2. Right ventricular systolic function is normal. The right ventricular  size is normal. There is normal pulmonary artery systolic pressure.  3. The mitral valve is normal in structure. No evidence of mitral valve  regurgitation. No evidence of mitral stenosis.  4. The aortic valve is tricuspid. Aortic valve regurgitation is mild.  Mild to moderate aortic valve sclerosis/calcification is present, without  any evidence of aortic stenosis.  5. The inferior vena cava is normal in size with greater than 50%  respiratory variability, suggesting right atrial pressure of 3 mmHg.  EKG: EKG 11/05/2019: Sinus rhythm at a rate of 84 bpm with first-degree AV block and 2 PVCs. Normal axis. Poor R wave progression, cannot exclude anterior septal infarct old.  EKG 04/06/2019: Sinus rhythm at a rate of89 bpm. Normal axis. Poor progression, cannot exclude anteroseptal infarct old. Nonspecific T wave abnormality.No evidence of ischemia or underlying injury pattern.   Scheduled Meds: . furosemide  40 mg Intravenous BID  . hydrALAZINE  50 mg Oral TID  . isosorbide dinitrate  30 mg Oral TID  . latanoprost  1 drop Both Eyes QHS  . lidocaine      . lidocaine      . mouth rinse  15 mL Mouth Rinse BID  .  metoprolol tartrate  50 mg Oral BID  . pantoprazole  40 mg Oral BID AC  . simvastatin  40 mg Oral Daily  . sodium chloride flush  3 mL Intravenous Q12H  . sucralfate  1 g Oral TID WC & HS  . tamsulosin  0.4 mg Oral Daily   Continuous Infusions:  PRN Meds:.ipratropium-albuterol, loratadine  Assessment/Plan:  In view of significant elevation in BNP compared to previous 3 days ago, suspect acute heart failure to be contributing to symptoms than previously expected.  Patient's creatinine is also increased, suspect secondary to volume congestion with a component of cardiorenal syndrome.  We will continue gentle diuresis, increase furosemide 40 mg IV twice daily.  We will continue to monitor renal function closely.  Continue hydralazine and isosorbide dinitrate at this time.  We will defer further management of medical comorbidities including suspected aspiration pneumonia to primary team.    We will continue to follow.  Patient was seen in collaboration with Dr. Einar Gip and he is in agreement of the plan.     Alethia Berthold, PA-C 04/08/2020, 12:54 PM Office: [phone]

## 2020-04-09 DIAGNOSIS — K922 Gastrointestinal hemorrhage, unspecified: Secondary | ICD-10-CM | POA: Diagnosis not present

## 2020-04-09 LAB — MAGNESIUM: Magnesium: 2.2 mg/dL (ref 1.7–2.4)

## 2020-04-09 LAB — RENAL FUNCTION PANEL
Albumin: 2.2 g/dL — ABNORMAL LOW (ref 3.5–5.0)
Anion gap: 10 (ref 5–15)
BUN: 72 mg/dL — ABNORMAL HIGH (ref 8–23)
CO2: 20 mmol/L — ABNORMAL LOW (ref 22–32)
Calcium: 7.4 mg/dL — ABNORMAL LOW (ref 8.9–10.3)
Chloride: 104 mmol/L (ref 98–111)
Creatinine, Ser: 3.67 mg/dL — ABNORMAL HIGH (ref 0.61–1.24)
GFR, Estimated: 15 mL/min — ABNORMAL LOW (ref >60)
Glucose, Bld: 122 mg/dL — ABNORMAL HIGH (ref 70–99)
Phosphorus: 5.3 mg/dL — ABNORMAL HIGH (ref 2.5–4.6)
Potassium: 4.3 mmol/L (ref 3.5–5.1)
Sodium: 134 mmol/L — ABNORMAL LOW (ref 135–145)

## 2020-04-09 LAB — SODIUM, URINE, RANDOM: Sodium, Ur: <10 mmol/L

## 2020-04-09 LAB — PARATHYROID HORMONE, INTACT (NO CA): PTH: 39 pg/mL (ref 15–65)

## 2020-04-09 LAB — CBC
HCT: 24.1 % — ABNORMAL LOW (ref 39.0–52.0)
HCT: 26.2 % — ABNORMAL LOW (ref 39.0–52.0)
Hemoglobin: 7.7 g/dL — ABNORMAL LOW (ref 13.0–17.0)
Hemoglobin: 8.6 g/dL — ABNORMAL LOW (ref 13.0–17.0)
MCH: 30 pg (ref 26.0–34.0)
MCH: 30.6 pg (ref 26.0–34.0)
MCHC: 32 g/dL (ref 30.0–36.0)
MCHC: 32.8 g/dL (ref 30.0–36.0)
MCV: 93.8 fL (ref 80.0–100.0)
MCV: 93.2 fL (ref 80.0–100.0)
Platelets: 302 10*3/uL (ref 150–400)
Platelets: 359 10*3/uL (ref 150–400)
RBC: 2.57 MIL/uL — ABNORMAL LOW (ref 4.22–5.81)
RBC: 2.81 MIL/uL — ABNORMAL LOW (ref 4.22–5.81)
RDW: 16.7 % — ABNORMAL HIGH (ref 11.5–15.5)
RDW: 16.9 % — ABNORMAL HIGH (ref 11.5–15.5)
WBC: 12.1 10*3/uL — ABNORMAL HIGH (ref 4.0–10.5)
WBC: 15.1 10*3/uL — ABNORMAL HIGH (ref 4.0–10.5)
nRBC: 0.4 % — ABNORMAL HIGH (ref 0.0–0.2)
nRBC: 0.5 % — ABNORMAL HIGH (ref 0.0–0.2)

## 2020-04-09 LAB — RETICULOCYTES
Immature Retic Fract: 28.1 % — ABNORMAL HIGH (ref 2.3–15.9)
RBC.: 2.77 MIL/uL — ABNORMAL LOW (ref 4.22–5.81)
Retic Count, Absolute: 147.6 10*3/uL (ref 19.0–186.0)
Retic Ct Pct: 5.3 % — ABNORMAL HIGH (ref 0.4–3.1)

## 2020-04-09 LAB — TYPE AND SCREEN
ABO/RH(D): O POS
Antibody Screen: NEGATIVE

## 2020-04-09 LAB — FERRITIN: Ferritin: 115 ng/mL (ref 24–336)

## 2020-04-09 LAB — PROTIME-INR
INR: 0.9 (ref 0.8–1.2)
Prothrombin Time: 12.2 seconds (ref 11.4–15.2)

## 2020-04-09 LAB — IRON AND TIBC
Iron: 28 ug/dL — ABNORMAL LOW (ref 45–182)
Saturation Ratios: 12 % — ABNORMAL LOW (ref 17.9–39.5)
TIBC: 231 ug/dL — ABNORMAL LOW (ref 250–450)
UIBC: 203 ug/dL

## 2020-04-09 LAB — BRAIN NATRIURETIC PEPTIDE: B Natriuretic Peptide: 1085 pg/mL — ABNORMAL HIGH (ref 0.0–100.0)

## 2020-04-09 MED ORDER — SODIUM CHLORIDE 0.9 % IV SOLN
250.0000 mg | Freq: Every day | INTRAVENOUS | Status: AC
  Administered 2020-04-09 – 2020-04-12 (×4): 250 mg via INTRAVENOUS
  Filled 2020-04-09 (×6): qty 20

## 2020-04-09 MED ORDER — LACTULOSE 10 GM/15ML PO SOLN
20.0000 g | Freq: Two times a day (BID) | ORAL | Status: DC
  Administered 2020-04-09 (×2): 20 g via ORAL
  Filled 2020-04-09 (×2): qty 30

## 2020-04-09 MED ORDER — NEPRO/CARBSTEADY PO LIQD
237.0000 mL | Freq: Two times a day (BID) | ORAL | Status: DC
  Administered 2020-04-09 – 2020-04-14 (×11): 237 mL via ORAL
  Filled 2020-04-09: qty 237

## 2020-04-09 MED ORDER — SODIUM BICARBONATE 650 MG PO TABS
650.0000 mg | ORAL_TABLET | Freq: Two times a day (BID) | ORAL | Status: DC
  Administered 2020-04-09 – 2020-04-12 (×7): 650 mg via ORAL
  Filled 2020-04-09 (×7): qty 1

## 2020-04-09 NOTE — Progress Notes (Signed)
[facility] Progress Note  Patient: Brian Wolf    OYD:[id number]  DOA: 04/05/2020     Date of Service: the patient was seen and examined on 04/09/2020  Brief hospital course: Past medical history of HTN, CVA, CKD, OSA on CPAP, sinus node dysfunction without a pacemaker.  Presents with severe anemia found to have duodenal ulcer on EGD.  Now appears to be having volume overload requiring IV diuresis.  This resulted in worsening renal function and now appears to have metabolic encephalopathy from uremia. Currently plan is monitor H&H and renal function and follow-up on recommendation from nephrology regarding renal function progression.  Assessment and Plan: 1.  Acute upper GI bleed secondary to duodenal ulcer Acute blood loss anemia Chronic active gastritis EGD 3/6 shows nonbleeding duodenal ulcer.  On PPI 40 mg daily indefinitely. Pathology positive for H. pylori but Warthin-Starry stain negative. We will discuss with GI regarding treatment. Baseline hemoglobin around 9-10. On admission hemoglobin 6.7. SP 2 PRBC transfusion.  Currently H&H relatively stable. Hemoglobin erroneously low on 3/9 morning at 7.7. Repeat hemoglobin 8.6 which is where he has been for last 48 hours. Reticulocyte count elevated suggesting acute anemia. Iron level low. INR normal. For now continue PPI p.o. and monitor H&H daily. Transfuse for hemoglobin less than 7. Discussed with GI regarding H. pylori diagnosis.  2.  AKI on CKD stage IV Metabolic acidosis Baseline serum creatinine around 2. Now appears to be progressively worsening. Currently serum creatinine 3.6.  BUN about 72. Mild worsening of mental status from baseline likely associated with his hospital stay as well as worsening uremia. Nephrology consulted. Renal ultrasound shows no obstruction. Hold diuretics. Urine sodium less than 10. Avoid nephrotoxic medication.  3.  Acute on chronic combined diastolic and systolic CHF Right  pleural effusion Echocardiogram on 3/6 EF 35 to 40%. Grade 1 diastolic dysfunction. Received IV diuresis with worsening renal function therefore currently diuresis on hold. Chest x-ray showed evidence of right-sided pleural effusion. Underwent thoracentesis with removal of 1.2 L of fluid. Fluid analysis consistent with transudative fluid. Repeat x-ray shows improvement in volume status.  Monitor for reaccumulation.  4.  Concern for pneumonia, ruled out There was initial concern for consolidation on the right side with aspiration. Currently significant improvement in chest x-ray after removal of the fluid therefore pneumonia ruled out.  No antibiotics.  5.  Atelectasis. OSA Recommended patient to remain compliant with CPAP here in the hospital.  Patient currently agreeable.  6.  Acute metabolic encephalopathy Mild worsening per family likely in the setting of procedures, anesthesia and uremia. Monitor.  Avoid psychotropic medication.  7.  HLD. Continue statin.  8.  BPH. Continue Flomax.  9.  Constipation. Lactulose for bowel regimen.  10.  Essential hypertension. Blood pressure stable. Continue current regimen although avoiding hypotensive events. Holding Aldactone and losartan in the setting of AKI.  11.  History of CVA. On aspirin prior to admission.  Currently on hold secondary to duodenal ulcer. We will discuss with GI regarding timing of resumption.  12.  Glaucoma. Continue home eyedrops.  Diet: Cardiac diet DVT Prophylaxis:   SCDs Start: 04/05/20 1053    Advance goals of care discussion: DNR  Family Communication: family was present at bedside, at the time of interview.  The pt provided permission to discuss medical plan with the family. Opportunity was given to ask question and all questions were answered satisfactorily.  Wife would not like any other family members to receive patient's information.  We will  refer other family members to wife for patient's  information.  Disposition:  Status is: Inpatient  Remains inpatient appropriate because:IV treatments appropriate due to intensity of illness or inability to take PO   Dispo: The patient is from: Home              Anticipated d/c is to: SNF              Patient currently is not medically stable to d/c.   Difficult to place patient No        Subjective: No nausea no vomiting.  No fever no chills.  Somewhat agitated and confused.  No bowel movement.  Passing gas.  Improving oral intake.  Physical Exam:  General: Appear in moderate distress, no Rash; Oral Mucosa Clear, moist. no Abnormal Neck Mass Or lumps, Conjunctiva normal  Cardiovascular: S1 and S2 Present, aortic systolic  Murmur, Respiratory: good respiratory effort, Bilateral Air entry present and faint Crackles, no wheezes Abdomen: Bowel Sound present, Soft and no tenderness Extremities: trace Pedal edema Neurology: alert and oriented to time, place, and person affect anxious. no new focal deficit Gait not checked due to patient safety concerns  Vitals:   04/09/20 0540 04/09/20 0755 04/09/20 1603 04/09/20 1720  BP: (!) 122/54 (!) 121/52 (!) 115/47   Pulse: (!) 59 (!) 56 65   Resp:  18 15   Temp: 97.7 F (36.5 C) 98.6 F (37 C) (!) 97.5 F (36.4 C)   TempSrc: Oral Oral Oral   SpO2: 93% 95% 93% 93%  Weight:      Height:        Intake/Output Summary (Last 24 hours) at 04/09/2020 1845 Last data filed at 04/09/2020 1721 Gross per 24 hour  Intake 484.77 ml  Output 475 ml  Net 9.77 ml   Filed Weights   04/05/20 0641  Weight: 69.4 kg    Data Reviewed: I have personally reviewed and interpreted daily labs, tele strips, imaging. I reviewed all nursing notes, pharmacy notes, vitals, pertinent old records I have discussed plan of care as described above with RN and patient/family.  CBC: Recent Labs  Lab 04/05/20 0729 04/05/20 1604 04/06/20 1610 04/07/20 0252 04/08/20 0144 04/09/20 0251 04/09/20 1208  WBC  10.4   < > 11.6* 11.0* 9.4 12.1* 15.1*  NEUTROABS 6.9  --   --   --   --   --   --   HGB 6.7*   < > 9.2* 8.4* 8.9* 7.7* 8.6*  HCT 21.3*   < > 28.2* 26.2* 27.3* 24.1* 26.2*  MCV 96.4   < > 92.5 93.6 93.2 93.8 93.2  PLT 251   < > 302 300 336 302 359   < > = values in this interval not displayed.   Basic Metabolic Panel: Recent Labs  Lab 04/05/20 0729 04/06/20 0642 04/07/20 0252 04/08/20 0144 04/09/20 0251  NA 136 140 139 136 134*  K 5.0 4.5 4.3 4.4 4.3  CL 107 110 109 109 104  CO2 19* 17* 20* 17* 20*  GLUCOSE 105* 83 122* 165* 122*  BUN 72* 69* 65* 66* 72*  CREATININE 2.63* 2.77* 2.96* 3.21* 3.67*  CALCIUM 7.9* 8.1* 8.1* 8.1* 7.4*  MG  --   --   --   --  2.2  PHOS  --   --  5.1* 4.8* 5.3*    Studies: No results found.  Scheduled Meds: . feeding supplement (NEPRO CARB STEADY)  237 mL Oral BID BM  . hydrALAZINE  50 mg Oral TID  . isosorbide dinitrate  30 mg Oral TID  . lactulose  20 g Oral BID  . latanoprost  1 drop Both Eyes QHS  . mouth rinse  15 mL Mouth Rinse BID  . pantoprazole  40 mg Oral BID AC  . saccharomyces boulardii  250 mg Oral BID  . simvastatin  40 mg Oral Daily  . sodium bicarbonate  650 mg Oral BID  . sodium chloride flush  3 mL Intravenous Q12H  . sucralfate  1 g Oral TID WC & HS  . tamsulosin  0.4 mg Oral Daily   Continuous Infusions: . ferric gluconate (FERRLECIT/NULECIT) IV 120 mL/hr at 04/09/20 1721   PRN Meds: ipratropium-albuterol, loratadine  Time spent: 35 minutes  Author: Berle Mull, MD [facility] 04/09/2020 6:45 PM  To reach On-call, see care teams to locate the attending and reach out via [email or web address]. Between 7PM-7AM, please contact night-coverage If you still have difficulty reaching the attending provider, please page the [facility] (Director on Call) for [facility] on amion for assistance.

## 2020-04-09 NOTE — Progress Notes (Signed)
Subjective:  Patient underwent right thoracentesis yielding 1.25 L on 04/08/2020, chest x-ray showed significant improvement of right-sided infiltrate following procedure. Patient examined in bed this morning, resting comfortably, alert and more talkative. General appearance much improved from yesterday.  Patient reports significant improvement of dyspnea and wheezing.  Denies chest pain, orthopnea.  Patient's BNP has trended down from 14 26-10 85.  Hemoglobin also trended down from 8.9-7.7. Patient net -1.3 L since admission. In view of continuing increase of creatinine to 3.6 today, nephrology was consulted, renal ultrasound pending.  Nephrology recommends holding diuretics until renal function improves/stablizes.   Intake/Output from previous day:  I/O last 3 completed shifts: In: 533 [P.O.:530; I.V.:3] Out: 500 [Urine:500] Total I/O In: 3 [I.V.:3] Out: -   Blood pressure (!) 121/52, pulse (!) 56, temperature 98.6 F (37 C), temperature source Oral, resp. rate 18, height _0  (1.651 m), weight 69.4 kg, SpO2 95 %. Physical Exam Vitals and nursing note reviewed.  Constitutional:      General: He is awake. He is not in acute distress.    Appearance: Normal appearance. He is obese. He is not toxic-appearing.  HENT:     Head: Normocephalic and atraumatic.     Nose: Nose normal.     Mouth/Throat:     Mouth: Mucous membranes are moist.  Eyes:     Extraocular Movements: Extraocular movements intact.     Conjunctiva/sclera: Conjunctivae normal.  Neck:     Vascular: No carotid bruit or JVD.  Cardiovascular:     Rate and Rhythm: Normal rate and regular rhythm.     Pulses: Intact distal pulses.     Heart sounds: S1 normal and S2 normal. No murmur heard. No gallop.   Pulmonary:     Effort: Pulmonary effort is normal. No respiratory distress.     Breath sounds: Rhonchi present. No wheezing or rales.     Comments: Wheezing significantly improved.  Rhonchi now present only in bases  bilaterally.  No supplementary oxygen at this time. Patient uses home CPAP, has not used CPAP since admission. Abdominal:     General: Bowel sounds are normal. There is no distension.     Palpations: Abdomen is soft.  Musculoskeletal:     Cervical back: Neck supple.     Right lower leg: Edema (trace) present.     Left lower leg: Edema (trace) present.  Skin:    General: Skin is warm and dry.  Neurological:     Mental Status: He is alert.     Lab Results: BMP BNP (last 3 results) Recent Labs    04/05/20 1604 04/08/20 0144 04/09/20 0251  BNP 203.3* 1,426.1* 1,085.0*    ProBNP (last 3 results) No results for input(s): PROBNP in the last 8760 hours. BMP Latest Ref Rng & Units 04/09/2020 04/08/2020 04/07/2020  Glucose 70 - 99 mg/dL 122(H) 165(H) 122(H)  BUN 8 - 23 mg/dL 72(H) 66(H) 65(H)  Creatinine 0.61 - 1.24 mg/dL 3.67(H) 3.21(H) 2.96(H)  Sodium 135 - 145 mmol/L 134(L) 136 139  Potassium 3.5 - 5.1 mmol/L 4.3 4.4 4.3  Chloride 98 - 111 mmol/L 104 109 109  CO2 22 - 32 mmol/L 20(L) 17(L) 20(L)  Calcium 8.9 - 10.3 mg/dL 7.4(L) 8.1(L) 8.1(L)   Hepatic Function Latest Ref Rng & Units 04/09/2020 04/08/2020 04/07/2020  Total Protein 6.5 - 8.1 g/dL - - -  Albumin 3.5 - 5.0 g/dL 2.2(L) 2.5(L) 2.3(L)  AST 15 - 41 U/L - - -  ALT 0 - 44 U/L - - -  Alk Phosphatase 38 - 126 U/L - - -  Total Bilirubin 0.3 - 1.2 mg/dL - - -   CBC Latest Ref Rng & Units 04/09/2020 04/08/2020 04/07/2020  WBC 4.0 - 10.5 K/uL 12.1(H) 9.4 11.0(H)  Hemoglobin 13.0 - 17.0 g/dL 7.7(L) 8.9(L) 8.4(L)  Hematocrit 39.0 - 52.0 % 24.1(L) 27.3(L) 26.2(L)  Platelets 150 - 400 K/uL 302 336 300   Lipid Panel     Component Value Date/Time   CHOL 147 12/15/2013 0342   TRIG 84 12/15/2013 0342   HDL 54 12/15/2013 0342   CHOLHDL 2.7 12/15/2013 0342   VLDL 17 12/15/2013 0342   LDLCALC 76 12/15/2013 0342   Cardiac Panel (last 3 results) No results for input(s): CKTOTAL, CKMB, TROPONINI, RELINDX in the last 72 hours.  HEMOGLOBIN  A1C Lab Results  Component Value Date   HGBA1C 6.4 (H) 12/15/2013   MPG 137 (H) 12/15/2013   TSH Recent Labs    10/20/19 0706 04/05/20 1847  TSH 2.706 4.188   Imaging: DG Chest Port 04/07/2020: CLINICAL DATA: Follow-up effusions and atelectasis versus pneumonia. EXAM: PORTABLE CHEST 1 VIEW COMPARISON: 04/06/2020 and earlier. FINDINGS: Cardiac silhouette mildly enlarged, unchanged. Pulmonary vascularity normal without evidence of pulmonary edema. Large RIGHT pleural effusion with associated dense consolidation in the RIGHT MIDDLE LOBE and RIGHT LOWER LOBE, unchanged. Small LEFT pleural effusion with associated consolidation in the LEFT LOWER LOBE, unchanged. No new abnormalities. IMPRESSION: 1. Stable large RIGHT pleural effusion and associated dense atelectasis and/or pneumonia in the RIGHT MIDDLE LOBE and RIGHT LOWER LOBE. 2. Stable small LEFT pleural effusion and associated passive atelectasis and/or pneumonia in the LEFT LOWER LOBE. 3. No new abnormalities. Electronically Signed By: Evangeline Dakin M.D. On: 04/07/2020 16:43   DG CHEST PORT 04/06/2020: CLINICAL DATA: Dyspnea EXAM: PORTABLE CHEST 1 VIEW COMPARISON: 04/05/2020 FINDINGS: Large right pleural effusion is unchanged with compressive atelectasis of the right lung base and opacification of the right hemithorax. Small left pleural effusion is present with persistent retrocardiac atelectasis. Right paratracheal opacity is unchanged from multiple prior examinations likely representing vascular shadow. Cardiac size within normal limits. The pulmonary vascularity is normal. No acute bone abnormality. Multiple healed right rib fractures are noted. IMPRESSION: Stable large right and small left pleural effusions with associated bibasilar compressive atelectasis. Electronically Signed By: Fidela Salisbury MD On: 04/06/2020 23:04   Cardiac Studies: Echocardiogram 03/09/2016:  Left ventricle: The cavity size was normal. Wall thickness  was increased in a pattern of mild LVH. Systolic function was  vigorous. The estimated ejection fraction was in the range of 65% to 70%. Wall motion was normal; there were no regional wall  motion abnormalities. Doppler parameters are consistent with abnormal left ventricular relaxation (grade 1 diastolic dysfunction).  - Aortic valve: Trileaflet; mildly thickened, mildly calcified leaflets.  - Left atrium: The atrium was mildly dilated  Long Term Monitor 11/02/2019: Max 139 bpm 09:19am, 09/18 Min 31 bpm 05:31am, 09/15 Avg 71 bpm 1.4% PACs 3.2% PVCs Predominant rhythm was sinus rhythm No prolonged arrhythmias noted Multiple episodes of complete AV block with a narrow QRS escape occurred between 5-5:30 AM  Echocardiogram 04/06/2020 1. Left ventricular ejection fraction, by estimation, is 35 to 40%. The  left ventricle has moderately decreased function. The left ventricle has  no regional wall motion abnormalities. Left ventricular diastolic  parameters are consistent with Grade I  diastolic dysfunction (impaired relaxation).  2. Right ventricular systolic function is normal. The right ventricular  size is normal. There is normal pulmonary artery systolic pressure.  3. The mitral valve is normal in structure. No evidence of mitral valve  regurgitation. No evidence of mitral stenosis.  4. The aortic valve is tricuspid. Aortic valve regurgitation is mild.  Mild to moderate aortic valve sclerosis/calcification is present, without  any evidence of aortic stenosis.  5. The inferior vena cava is normal in size with greater than 50%  respiratory variability, suggesting right atrial pressure of 3 mmHg.  EKG: EKG 11/05/2019: Sinus rhythm at a rate of 84 bpm with first-degree AV block and 2 PVCs. Normal axis. Poor R wave progression, cannot exclude anterior septal infarct old.  EKG 04/06/2019: Sinus rhythm at a rate of89 bpm. Normal axis. Poor progression, cannot  exclude anteroseptal infarct old. Nonspecific T wave abnormality.No evidence of ischemia or underlying injury pattern.   Scheduled Meds: . feeding supplement (NEPRO CARB STEADY)  237 mL Oral BID BM  . hydrALAZINE  50 mg Oral TID  . isosorbide dinitrate  30 mg Oral TID  . lactulose  20 g Oral BID  . latanoprost  1 drop Both Eyes QHS  . mouth rinse  15 mL Mouth Rinse BID  . pantoprazole  40 mg Oral BID AC  . saccharomyces boulardii  250 mg Oral BID  . simvastatin  40 mg Oral Daily  . sodium bicarbonate  650 mg Oral BID  . sodium chloride flush  3 mL Intravenous Q12H  . sucralfate  1 g Oral TID WC & HS  . tamsulosin  0.4 mg Oral Daily   Continuous Infusions:  PRN Meds:.ipratropium-albuterol, loratadine  Assessment/Plan:  Patient symptoms have significantly improved following right-sided thoracentesis yielding 1.2 L fluid.  On exam patient's dyspnea and wheezing have signicantly improved. Chest x-ray has also demonstrated significant improvement and BNP has trended down since yesterday.  Patient appears euvolemic on exam.  Agree that patient's dyspnea likely primarily driven by aspiration pneumonia with pneumonitis and reactive airway disease, particularly in view of responsiveness to antibiotics and present pulmonary medications.  We will continue to monitor closely for acute decompensated heart failure.  However at this time nephrology was consulted in view of patient's acute kidney injury superimposed on chronic kidney disease and recommends holding diuretics at this time until renal function improves and creatinine stabilizes.  Continue strict I&O's and daily weight.  Management of other medical comorbidities per primary team.   Dr. Einar Gip has personally spoken with the patient himself as well as his wife, who has healthcare power of attorney.  Both patient and his wife do not wish for heroic measures. Patient and his wife are both in agreement to make patient DNR. Dr. Einar Gip has added  DNR to patient's chart.   Patient was seen in collaboration with Dr. Einar Gip and he is in agreement of the plan.    Alethia Berthold, PA-C 04/09/2020, 10:18 AM Office: [phone]

## 2020-04-09 NOTE — Progress Notes (Signed)
Physical Therapy Treatment Patient Details Name: Brian Wolf MRN: [id number] DOB: 1931-10-04 Today's Date: 04/09/2020    History of Present Illness Pt is an 87 y/o male admitted 3/5 secondary to dark stools; thought to be secondary to GI bleed. Pt is s/p EGD on 3/6 that showed large duodenal ulcer. Since admission, pt has developed wheezing as well. Thought to be secondary to CHF and possible aspiration PNA. Pt is s/p thoracentesis on 3/8. PMH includes HTN, CVA, CKD, and OSA on CPAP.    PT Comments    Pt remains limited secondary to agitation. Cursing at PT and swatting at wife during session. Was eventually agreeable to getting to chair with max encouragement. Pt's wife present throughout session and educated about benefits of getting to chair. Current recommendations appropriate. Will continue to follow acutely.     Follow Up Recommendations  Home health PT;Supervision/Assistance - 24 hour     Equipment Recommendations  Other (comment) (rollator (4 wheeled walker with seat))    Recommendations for Other Services       Precautions / Restrictions Precautions Precautions: Fall Restrictions Weight Bearing Restrictions: No    Mobility  Bed Mobility Overal bed mobility: Needs Assistance Bed Mobility: Supine to Sit     Supine to sit: Min assist     General bed mobility comments: Min A for trunk and LE assist. Increased time required. Max encouragement to participate.    Transfers Overall transfer level: Needs assistance Equipment used: None Transfers: Sit to/from Stand Sit to Stand: Min guard         General transfer comment: min guard for safety.  Ambulation/Gait Ambulation/Gait assistance: Min guard Gait Distance (Feet): 5 Feet Assistive device: None Gait Pattern/deviations: Step-through pattern;Decreased stride length;Shuffle Gait velocity: Decreased   General Gait Details: Pt only agreeable to ambulating to chair. shuffle type gait.   Stairs              Wheelchair Mobility    Modified Rankin (Stroke Patients Only)       Balance Overall balance assessment: Needs assistance Sitting-balance support: No upper extremity supported;Feet supported Sitting balance-[name]: Fair     Standing balance support: No upper extremity supported;During functional activity Standing balance-[name]: Fair                              Cognition Arousal/Alertness: Awake/alert Behavior During Therapy: Agitated Overall Cognitive Status: Impaired/Different from baseline Area of Impairment: Awareness;Safety/judgement;Problem solving;Following commands                       Following Commands: Follows one step commands with increased time Safety/Judgement: Decreased awareness of deficits;Decreased awareness of safety Awareness: Emergent Problem Solving: Slow processing General Comments: Pt initially pleasant, however, when it came time to ambulate, pt getting agitated. Cursing at PT and swatting at his wife. Repeatedly said "leave me alone", but eventually transferred to chair.      Exercises      General Comments General comments (skin integrity, edema, etc.): Pt's wife present during session      Pertinent Vitals/Pain Pain Assessment: No/denies pain    Home Living                      Prior Function            PT Goals (current goals can now be found in the care plan section) Acute Rehab PT Goals Patient Stated  Goal: to be left alone PT Goal Formulation: With patient Time For Goal Achievement: 04/21/20 Potential to Achieve Goals: Fair Progress towards PT goals: Progressing toward goals (slowly)    Frequency    Min 3X/week      PT Plan Current plan remains appropriate    Co-evaluation              AM-PAC PT "6 Clicks" Mobility   Outcome Measure  Help needed turning from your back to your side while in a flat bed without using bedrails?: None Help needed moving from  lying on your back to sitting on the side of a flat bed without using bedrails?: A Little Help needed moving to and from a bed to a chair (including a wheelchair)?: A Little Help needed standing up from a chair using your arms (e.g., wheelchair or bedside chair)?: A Little Help needed to walk in hospital room?: A Little Help needed climbing 3-5 steps with a railing? : A Lot 6 Click Score: 18    End of Session Equipment Utilized During Treatment: Gait belt Activity Tolerance: Treatment limited secondary to agitation Patient left: in chair;with call bell/phone within reach;with chair alarm set Nurse Communication: Mobility status PT Visit Diagnosis: Other abnormalities of gait and mobility (R26.89);Difficulty in walking, not elsewhere classified (R26.2);Muscle weakness (generalized) (M62.81)     Time: 6387-5643 PT Time Calculation (min) (ACUTE ONLY): 18 min  Charges:  $Therapeutic Activity: 8-22 mins                     Lou Miner, DPT  Acute Rehabilitation Services  Pager: [phone] Office: [phone]    Rudean Hitt 04/09/2020, 1:14 PM

## 2020-04-09 NOTE — Plan of Care (Signed)
Patient is s/p thoracentesis - removed 1.25 L of fluid. Patient's wheezing has improved greatly and is now on 1L of O2 nasal cannula. Will see about putting him on room air by morning. NAD or needs voiced. Will continue to monitor and continue current POC.

## 2020-04-09 NOTE — Evaluation (Signed)
Clinical/Bedside Swallow Evaluation Patient Details  Name: Brian Wolf MRN: [id number] Date of Birth: Feb 19, 1931  Today's Date: 04/09/2020 Time: SLP Start Time (ACUTE ONLY): 5498 SLP Stop Time (ACUTE ONLY): 1406 SLP Time Calculation (min) (ACUTE ONLY): 150 min  Past Medical History:  Past Medical History:  Diagnosis Date  . CVA (cerebral infarction) 12/2013  . Dementia ([place])   . Gastric ulcer    prior to 2000  . Hypertension   . Sleep difficulties    Past Surgical History:  Past Surgical History:  Procedure Laterality Date  . ABDOMINAL SURGERY    . BIOPSY  04/06/2020   Procedure: BIOPSY;  Surgeon: Brian Lobo, MD;  Location: [place];  Service: Endoscopy;;  . CATARACT EXTRACTION Left   . ESOPHAGOGASTRODUODENOSCOPY N/A 10/18/2019   Procedure: ESOPHAGOGASTRODUODENOSCOPY (EGD);  Surgeon: Brian Horner, MD;  Location: [facility] ENDOSCOPY;  Service: Endoscopy;  Laterality: N/A;  . ESOPHAGOGASTRODUODENOSCOPY (EGD) WITH PROPOFOL N/A 04/06/2020   Procedure: ESOPHAGOGASTRODUODENOSCOPY (EGD) WITH PROPOFOL;  Surgeon: Brian Lobo, MD;  Location: [place];  Service: Endoscopy;  Laterality: N/A;  . IR THORACENTESIS ASP PLEURAL SPACE W/IMG GUIDE  04/08/2020   HPI:  Pt is an 87 year old male who presented with recurrent ulcer disease in the absence of ulcerogenic medication exposure. Pt admitted with melena and anemia. EGD showed clean-based duodenal ulcer which is the presumed source of the patient's recent bleeding. Pt is on IV protonix. CXR showed  relatively stable cardiomegaly with right-sided pleural effusion with pulmonary vascular congestion and possible bibasilar atelectasis. Pt underwent and has improved significantly with antibiotics. Cardiologist writes "Chest x-ray has also demonstrated significant improvement and BNP has trended down since yesterday.  Patient appears euvolemic on exam.  Agree that patient's dyspnea likely primarily driven by aspiration pneumonia with pneumonitis  and reactive airway disease, particularly in view of responsiveness to antibiotics and present pulmonary medications."  Wife denies observations of esophageal or pharyngeal dysphagia but reports his appetite has been poor. Only improved today. Pt has been been evalauted clinically by SLP in prior admissions, never found to have dysphagia.   Assessment / Plan / Recommendation Clinical Impression  Pt and wife deny trouble swallowing. Pt very reluctant to participate in assessment. He did accept at least one bite of all textures without difficulty. He coughing on 1 out of fours sips taken and also had delayed coughing. There is a possibility of oropharyngeal dysphagia. Pt will need instrumental assessment for any further evaluation, which he may not participate in. Will attempt MBS tomorrow. SLP Visit Diagnosis: Dysphagia, unspecified (R13.10)    Aspiration Risk  Mild aspiration risk    Diet Recommendation Regular;Thin liquid   Liquid Administration via: Cup;Straw Medication Administration: Whole meds with liquid Supervision: Patient able to self feed Postural Changes: Seated upright at 90 degrees    Other  Recommendations Oral Care Recommendations: Oral care BID   Follow up Recommendations        Frequency and Duration            Prognosis        Swallow Study   General HPI: Pt is an 87 year old male who presented with recurrent ulcer disease in the absence of ulcerogenic medication exposure. Pt admitted with melena and anemia. EGD showed clean-based duodenal ulcer which is the presumed source of the patient's recent bleeding. Pt is on IV protonix. CXR showed  relatively stable cardiomegaly with right-sided pleural effusion with pulmonary vascular congestion and possible bibasilar atelectasis. Pt underwent and has improved  significantly with antibiotics. Cardiologist writes "Chest x-ray has also demonstrated significant improvement and BNP has trended down since yesterday.  Patient  appears euvolemic on exam.  Agree that patient's dyspnea likely primarily driven by aspiration pneumonia with pneumonitis and reactive airway disease, particularly in view of responsiveness to antibiotics and present pulmonary medications."  Wife denies observations of esophageal or pharyngeal dysphagia but reports his appetite has been poor. Only improved today. Pt has been been evalauted clinically by SLP in prior admissions, never found to have dysphagia. Type of Study: Bedside Swallow Evaluation Previous Swallow Assessment: see HPI Diet Prior to this Study: Regular;Thin liquids Temperature Spikes Noted: No Respiratory Status: Room air History of Recent Intubation: No Behavior/Cognition: Alert;Other (Comment) (grumpy and resists at times) Oral Cavity Assessment: Within Functional Limits Oral Care Completed by SLP: No Oral Cavity - Dentition: Adequate natural dentition Vision: Functional for self-feeding Self-Feeding Abilities: Total assist Patient Positioning: Upright in chair Baseline Vocal Quality: Normal Volitional Cough: Strong    Oral/Motor/Sensory Function Overall Oral Motor/Sensory Function: Within functional limits   Ice Chips     Thin Liquid Thin Liquid: Impaired Presentation: Straw Pharyngeal  Phase Impairments: Cough - Immediate;Cough - Delayed    Nectar Thick Nectar Thick Liquid: Not tested   Honey Thick Honey Thick Liquid: Not tested   Puree Puree: Within functional limits   Solid     Solid: Within functional limits     Brian Baltimore, MA [place] Pager [phone] Office [phone]  Brian Wolf 04/09/2020,1:52 PM

## 2020-04-09 NOTE — Progress Notes (Signed)
Spoke with Remo Lipps, the patient's son, and gave him updates to the best of my ability. Would like to talk to Dr. Wynetta Emery later on today to get more information on his father's care. Will pass along to day shift nurse.

## 2020-04-09 NOTE — Progress Notes (Signed)
Nephrology Follow-Up Consult note   Assessment/Recommendations: Brian Wolf is a/an 87 y.o. male with a past medical history significant for HTN, CVA, CKD, GI bleeding, OSA who present w/ weakness and GI bleed now w/ AKI on CKD  Non-Oliguric AKI on CKD, unclear stage 3b/IV: Likely baseline arterionephrosclerosis but unclear what his baseline GFR is.  May be beneficial to access outpatient records.  Most likely around 1.5-2 but could be higher.  Creatinine on arrival was 2.6.  Creatinine continues to increase today to 3.7 but seems to be stabilizing.  Renal ultrasound without significant concern but bilateral cysts.  Urinalysis with some pyuria and proteinuria with positive nitrates. -Signs of urinary tract infection, receiving empirical treatment with Augmentin right now (also covering pulmonary source).  Follow-up urine culture -Follow-up urine sodium -Continue to hold diuretics given stable pulmonary status -Continue to monitor daily Cr, Dose meds for GFR -Monitor Daily I/Os, Daily weight  -Maintain MAP>65 for optimal renal perfusion.  -Avoid nephrotoxic medications including NSAIDs and Vanc/Zosyn combo -Currently no indication for HD  Hypertension: Continue current medications as prescribed.  Blood pressure acceptable  Aspiration pneumonitis/pneumonia: Possible infection on antibiotics per primary team  Acute hypoxic respiratory failure: Likely multifactorial with pleural effusion and possible aspiration pneumonitis with reactive airway disease contributing.    Has responded to nebulizers as well as thoracentesis  Metabolic acidosis: Likely associated with AKI.    Has improved with supplementation.  Continue monitor bicarb and stop oral bicarb if needed  Anemia/GI bleed: Hemoglobin 7.7 today.  GI bleed on arrival.  Management per primary team.  No ESA at this time   Recommendations conveyed to primary service.    [facility] 04/09/2020 12:48 PM  ___________________________________________________________  CC: AKI on CKD  Interval History/Subjective: Patient states he feels slightly better today.  Less confused and lethargic.  Urine output not well documented but creatinine seems to be stabilizing.   Medications:  Current Facility-Administered Medications  Medication Dose Route Frequency Provider Last Rate Last Admin  . feeding supplement (NEPRO CARB STEADY) liquid 237 mL  237 mL Oral BID BM Lavina Hamman, MD   237 mL at 04/09/20 0859  . hydrALAZINE (APRESOLINE) tablet 50 mg  50 mg Oral TID Ronald Lobo, MD   50 mg at 04/09/20 0851  . ipratropium-albuterol (DUONEB) 0.5-2.5 (3) MG/3ML nebulizer solution 3 mL  3 mL Nebulization Q6H PRN Lovey Newcomer T, NP   3 mL at 04/07/20 0617  . isosorbide dinitrate (ISORDIL) tablet 30 mg  30 mg Oral TID Ronald Lobo, MD   30 mg at 04/09/20 0851  . lactulose (CHRONULAC) 10 GM/15ML solution 20 g  20 g Oral BID Lavina Hamman, MD   20 g at 04/09/20 0854  . latanoprost (XALATAN) 0.005 % ophthalmic solution 1 drop  1 drop Both Eyes QHS Johnson, Clanford L, MD   1 drop at 04/08/20 2219  . loratadine (CLARITIN) tablet 10 mg  10 mg Oral Daily PRN Johnson, Clanford L, MD      . MEDLINE mouth rinse  15 mL Mouth Rinse BID Johnson, Clanford L, MD   15 mL at 04/09/20 0853  . pantoprazole (PROTONIX) EC tablet 40 mg  40 mg Oral BID AC Buccini, [name], MD   40 mg at 04/09/20 0851  . saccharomyces boulardii (FLORASTOR) capsule 250 mg  250 mg Oral BID Wynetta Emery, Clanford L, MD   250 mg at 04/09/20 0851  . simvastatin (ZOCOR) tablet 40 mg  40 mg  Oral Daily Ronald Lobo, MD   40 mg at 04/08/20 2218  . sodium bicarbonate tablet 650 mg  650 mg Oral BID Reesa Chew, MD   650 mg at 04/09/20 0850  . sodium chloride flush (NS) 0.9 % injection 3 mL  3 mL Intravenous Q12H Buccini, [name], MD   3 mL at 04/09/20 0853  . sucralfate (CARAFATE) 1 GM/10ML suspension 1 g  1 g Oral TID  WC & HS Buccini, Robert, MD   1 g at 04/09/20 0938  . tamsulosin (FLOMAX) capsule 0.4 mg  0.4 mg Oral Daily Johnson, Clanford L, MD   0.4 mg at 04/08/20 2218      Review of Systems: 10 systems reviewed and negative except per interval history/subjective  Physical Exam: Vitals:   04/09/20 0540 04/09/20 0755  BP: (!) 122/54 (!) 121/52  Pulse: (!) 59 (!) 56  Resp:  18  Temp: 97.7 F (36.5 C) 98.6 F (37 C)  SpO2: 93% 95%   Total I/O In: 243 [P.O.:240; I.V.:3] Out: 125 [Urine:125]  Intake/Output Summary (Last 24 hours) at 04/09/2020 1248 Last data filed at 04/09/2020 0900 Gross per 24 hour  Intake 483 ml  Output 325 ml  Net 158 ml    General: Chronically ill-appearing, sitting in bed, no distress HEENT: anicteric sclera,  moist mucous membranes CV: Normal rate, no audible murmurs, trace edema in ankles Lungs:  Improved aeration throughout the lungs with minimal coarse airway sounds, no increased work of breathing Abd: soft, non-tender, non-distended Skin: no visible lesions or rashes Neuro, awake, alert, answers questions appropriately   Test Results I personally reviewed new and old clinical labs and radiology tests Lab Results  Component Value Date   NA 134 (L) 04/09/2020   K 4.3 04/09/2020   CL 104 04/09/2020   CO2 20 (L) 04/09/2020   BUN 72 (H) 04/09/2020   CREATININE 3.67 (H) 04/09/2020   CALCIUM 7.4 (L) 04/09/2020   ALBUMIN 2.2 (L) 04/09/2020   PHOS 5.3 (H) 04/09/2020

## 2020-04-09 NOTE — Plan of Care (Signed)

## 2020-04-10 ENCOUNTER — Inpatient Hospital Stay (HOSPITAL_COMMUNITY): Payer: Medicare Other

## 2020-04-10 DIAGNOSIS — K922 Gastrointestinal hemorrhage, unspecified: Secondary | ICD-10-CM | POA: Diagnosis not present

## 2020-04-10 LAB — RENAL FUNCTION PANEL
Albumin: 2.1 g/dL — ABNORMAL LOW (ref 3.5–5.0)
Anion gap: 9 (ref 5–15)
BUN: 81 mg/dL — ABNORMAL HIGH (ref 8–23)
CO2: 22 mmol/L (ref 22–32)
Calcium: 7.8 mg/dL — ABNORMAL LOW (ref 8.9–10.3)
Chloride: 105 mmol/L (ref 98–111)
Creatinine, Ser: 3.75 mg/dL — ABNORMAL HIGH (ref 0.61–1.24)
GFR, Estimated: 15 mL/min — ABNORMAL LOW (ref >60)
Glucose, Bld: 105 mg/dL — ABNORMAL HIGH (ref 70–99)
Phosphorus: 4.4 mg/dL (ref 2.5–4.6)
Potassium: 3.9 mmol/L (ref 3.5–5.1)
Sodium: 136 mmol/L (ref 135–145)

## 2020-04-10 LAB — BRAIN NATRIURETIC PEPTIDE: B Natriuretic Peptide: 775.5 pg/mL — ABNORMAL HIGH (ref 0.0–100.0)

## 2020-04-10 MED ORDER — METOPROLOL SUCCINATE ER 25 MG PO TB24
25.0000 mg | ORAL_TABLET | Freq: Every day | ORAL | Status: DC
  Administered 2020-04-10 – 2020-04-18 (×9): 25 mg via ORAL
  Filled 2020-04-10 (×9): qty 1

## 2020-04-10 MED ORDER — LACTATED RINGERS IV SOLN: INTRAVENOUS | Status: AC

## 2020-04-10 MED ORDER — LACTULOSE 10 GM/15ML PO SOLN
10.0000 g | Freq: Every day | ORAL | Status: DC
  Administered 2020-04-14 – 2020-04-18 (×4): 10 g via ORAL
  Filled 2020-04-10 (×7): qty 15

## 2020-04-10 NOTE — Progress Notes (Signed)
Subjective:  Patient seen and examined in bed this morning, resting comfortably.  Presently on supplemental oxygen via nasal cannula at 2 L/min.  Had swallowing study done this morning.  No specific complaints, states that he feels tired. Intake/Output from previous day:  I/O last 3 completed shifts: In: 484.8 [P.O.:480; I.V.:3; IV Piggyback:1.8] Out: 475 [Urine:475] No intake/output data recorded.  Blood pressure 139/67, pulse 77, temperature 98.1 F (36.7 C), temperature source Oral, resp. rate 17, height _0  (1.651 m), weight 69.4 kg, SpO2 91 %.   Vitals with BMI 04/10/2020 04/10/2020 04/09/2020  Height - - -  Weight - - -  BMI - - -  Systolic 465 035 465  Diastolic 67 59 54  Pulse 77 66 74    Filed Weights   04/05/20 0641  Weight: 69.4 kg    Physical Exam Vitals and nursing note reviewed.  Constitutional:      General: He is awake. He is not in acute distress.    Appearance: Normal appearance. He is obese. He is not toxic-appearing.  HENT:     Head: Normocephalic and atraumatic.     Nose: Nose normal.     Mouth/Throat:     Mouth: Mucous membranes are moist.  Eyes:     Extraocular Movements: Extraocular movements intact.     Conjunctiva/sclera: Conjunctivae normal.  Neck:     Vascular: No carotid bruit or JVD.  Cardiovascular:     Rate and Rhythm: Normal rate and regular rhythm.     Pulses: Intact distal pulses.     Heart sounds: S1 normal and S2 normal. No murmur heard. No gallop.   Pulmonary:     Effort: Pulmonary effort is normal. No respiratory distress.     Breath sounds: No wheezing, rhonchi or rales.  Musculoskeletal:     Cervical back: Neck supple.     Right lower leg: No edema.     Left lower leg: No edema.  Skin:    General: Skin is warm and dry.  Neurological:     Mental Status: He is alert and oriented to person, place, and time. Mental status is at baseline.     Lab Results: BMP BNP (last 3 results) Recent Labs    04/08/20 0144  04/09/20 0251 04/10/20 0348  BNP 1,426.1* 1,085.0* 775.5*    ProBNP (last 3 results) No results for input(s): PROBNP in the last 8760 hours. BMP Latest Ref Rng & Units 04/10/2020 04/09/2020 04/08/2020  Glucose 70 - 99 mg/dL 105(H) 122(H) 165(H)  BUN 8 - 23 mg/dL 81(H) 72(H) 66(H)  Creatinine 0.61 - 1.24 mg/dL 3.75(H) 3.67(H) 3.21(H)  Sodium 135 - 145 mmol/L 136 134(L) 136  Potassium 3.5 - 5.1 mmol/L 3.9 4.3 4.4  Chloride 98 - 111 mmol/L 105 104 109  CO2 22 - 32 mmol/L 22 20(L) 17(L)  Calcium 8.9 - 10.3 mg/dL 7.8(L) 7.4(L) 8.1(L)   Hepatic Function Latest Ref Rng & Units 04/10/2020 04/09/2020 04/08/2020  Total Protein 6.5 - 8.1 g/dL - - -  Albumin 3.5 - 5.0 g/dL 2.1(L) 2.2(L) 2.5(L)  AST 15 - 41 U/L - - -  ALT 0 - 44 U/L - - -  Alk Phosphatase 38 - 126 U/L - - -  Total Bilirubin 0.3 - 1.2 mg/dL - - -   CBC Latest Ref Rng & Units 04/09/2020 04/09/2020 04/08/2020  WBC 4.0 - 10.5 K/uL 15.1(H) 12.1(H) 9.4  Hemoglobin 13.0 - 17.0 g/dL 8.6(L) 7.7(L) 8.9(L)  Hematocrit 39.0 - 52.0 % 26.2(L) 24.1(L)  27.3(L)  Platelets 150 - 400 K/uL 359 302 336   Lipid Panel     Component Value Date/Time   CHOL 147 12/15/2013 0342   TRIG 84 12/15/2013 0342   HDL 54 12/15/2013 0342   CHOLHDL 2.7 12/15/2013 0342   VLDL 17 12/15/2013 0342   LDLCALC 76 12/15/2013 0342   Cardiac Panel (last 3 results) No results for input(s): CKTOTAL, CKMB, TROPONINI, RELINDX in the last 72 hours.  HEMOGLOBIN A1C Lab Results  Component Value Date   HGBA1C 6.4 (H) 12/15/2013   MPG 137 (H) 12/15/2013   TSH Recent Labs    10/20/19 0706 04/05/20 1847  TSH 2.706 4.188   Imaging: DG Chest Port 04/07/2020: CLINICAL DATA: Follow-up effusions and atelectasis versus pneumonia. EXAM: PORTABLE CHEST 1 VIEW COMPARISON: 04/06/2020 and earlier. FINDINGS: Cardiac silhouette mildly enlarged, unchanged. Pulmonary vascularity normal without evidence of pulmonary edema. Large RIGHT pleural effusion with associated dense consolidation in  the RIGHT MIDDLE LOBE and RIGHT LOWER LOBE, unchanged. Small LEFT pleural effusion with associated consolidation in the LEFT LOWER LOBE, unchanged. No new abnormalities. IMPRESSION: 1. Stable large RIGHT pleural effusion and associated dense atelectasis and/or pneumonia in the RIGHT MIDDLE LOBE and RIGHT LOWER LOBE. 2. Stable small LEFT pleural effusion and associated passive atelectasis and/or pneumonia in the LEFT LOWER LOBE. 3. No new abnormalities. Electronically Signed By: Evangeline Dakin M.D. On: 04/07/2020 16:43   DG CHEST PORT 04/06/2020: CLINICAL DATA: Dyspnea EXAM: PORTABLE CHEST 1 VIEW COMPARISON: 04/05/2020 FINDINGS: Large right pleural effusion is unchanged with compressive atelectasis of the right lung base and opacification of the right hemithorax. Small left pleural effusion is present with persistent retrocardiac atelectasis. Right paratracheal opacity is unchanged from multiple prior examinations likely representing vascular shadow. Cardiac size within normal limits. The pulmonary vascularity is normal. No acute bone abnormality. Multiple healed right rib fractures are noted. IMPRESSION: Stable large right and small left pleural effusions with associated bibasilar compressive atelectasis. Electronically Signed By: Fidela Salisbury MD On: 04/06/2020 23:04   Cardiac Studies: Echocardiogram 03/09/2016:  Left ventricle: The cavity size was normal. Wall thickness was increased in a pattern of mild LVH. Systolic function was  vigorous. The estimated ejection fraction was in the range of 65% to 70%. Wall motion was normal; there were no regional wall  motion abnormalities. Doppler parameters are consistent with abnormal left ventricular relaxation (grade 1 diastolic dysfunction).  - Aortic valve: Trileaflet; mildly thickened, mildly calcified leaflets.  - Left atrium: The atrium was mildly dilated  Long Term Monitor 11/02/2019: Max 139 bpm 09:19am, 09/18 Min 31  bpm 05:31am, 09/15 Avg 71 bpm 1.4% PACs 3.2% PVCs Predominant rhythm was sinus rhythm No prolonged arrhythmias noted Multiple episodes of complete AV block with a narrow QRS escape occurred between 5-5:30 AM  Echocardiogram 04/06/2020 1. Left ventricular ejection fraction, by estimation, is 35 to 40%. The  left ventricle has moderately decreased function. The left ventricle has  no regional wall motion abnormalities. Left ventricular diastolic  parameters are consistent with Grade I  diastolic dysfunction (impaired relaxation).  2. Right ventricular systolic function is normal. The right ventricular  size is normal. There is normal pulmonary artery systolic pressure.  3. The mitral valve is normal in structure. No evidence of mitral valve  regurgitation. No evidence of mitral stenosis.  4. The aortic valve is tricuspid. Aortic valve regurgitation is mild.  Mild to moderate aortic valve sclerosis/calcification is present, without  any evidence of aortic stenosis.  5. The inferior vena cava is  normal in size with greater than 50%  respiratory variability, suggesting right atrial pressure of 3 mmHg.  EKG: EKG 11/05/2019: Sinus rhythm at a rate of 84 bpm with first-degree AV block and 2 PVCs. Normal axis. Poor R wave progression, cannot exclude anterior septal infarct old.  EKG 04/06/2019: Sinus rhythm at a rate of89 bpm. Normal axis. Poor progression, cannot exclude anteroseptal infarct old. Nonspecific T wave abnormality.No evidence of ischemia or underlying injury pattern.  No current facility-administered medications on file prior to encounter.   Current Outpatient Medications on File Prior to Encounter  Medication Sig Dispense Refill  . aspirin 81 MG EC tablet Take 81 mg by mouth daily.    . fexofenadine (ALLEGRA) 30 MG tablet Take 30 mg by mouth daily as needed (allergies).     . furosemide (LASIX) 20 MG tablet Take 20 mg by mouth.    . hydrALAZINE (APRESOLINE) 50 MG  tablet Take 1 tablet (50 mg total) by mouth 3 (three) times daily. 30 tablet 0  . hydrochlorothiazide (HYDRODIURIL) 25 MG tablet Take 25 mg by mouth daily.    . isosorbide dinitrate (ISORDIL) 30 MG tablet Take 1 tablet (30 mg total) by mouth 3 (three) times daily. 90 tablet 2  . losartan (COZAAR) 100 MG tablet Take 100 mg by mouth daily.    . metoprolol tartrate (LOPRESSOR) 50 MG tablet Take 50 mg by mouth 2 (two) times daily.    Marland Kitchen NIFEdipine (PROCARDIA XL) 60 MG 24 hr tablet Take 1 tablet (60 mg total) by mouth daily. 30 tablet 3  . pantoprazole (PROTONIX) 40 MG tablet Take 1 tablet (40 mg total) by mouth 2 (two) times daily before a meal. 60 tablet 0  . Probiotic Product (PROBIOTIC DAILY PO) Take 1 capsule by mouth daily.     . simvastatin (ZOCOR) 40 MG tablet Take 40 mg by mouth daily.    Marland Kitchen spironolactone (ALDACTONE) 25 MG tablet Take 1 tablet (25 mg total) by mouth daily. 90 tablet 1  . tamsulosin (FLOMAX) 0.4 MG CAPS capsule Take 0.4 mg by mouth daily. 30 minutes after each meal    . Travoprost, BAK Free, (TRAVATAN) 0.004 % SOLN ophthalmic solution Place 1 drop into both eyes at bedtime.       Scheduled Meds: . feeding supplement (NEPRO CARB STEADY)  237 mL Oral BID BM  . hydrALAZINE  50 mg Oral TID  . isosorbide dinitrate  30 mg Oral TID  . lactulose  10 g Oral Daily  . latanoprost  1 drop Both Eyes QHS  . mouth rinse  15 mL Mouth Rinse BID  . pantoprazole  40 mg Oral BID AC  . saccharomyces boulardii  250 mg Oral BID  . simvastatin  40 mg Oral Daily  . sodium bicarbonate  650 mg Oral BID  . sodium chloride flush  3 mL Intravenous Q12H  . sucralfate  1 g Oral TID WC & HS  . tamsulosin  0.4 mg Oral Daily   Continuous Infusions: . ferric gluconate (FERRLECIT/NULECIT) IV 250 mg (04/10/20 0901)  . lactated ringers     PRN Meds:.ipratropium-albuterol, loratadine  Assessment/Plan:   Nollie Terlizzi  is a  87 y.o. Caucasian male with history of stroke in 2015 with no recurrence  and has mild residual left facial and also left upper extremity weakness, difficult to control hypertension, hyperlipidemia, bradycardia, frequent PVCs with a PVC burden of 3.2% by event monitoring, transient heart block which resolved with decreasing the dose of beta-blocker.  1.  Acute systolic heart failure probably related to stress cardiomyopathy.  No wall motion abnormality. 2.  Acute GI bleed needing blood transfusion.  Continues to remain anemic, CBC stable. 3.  Acute on chronic kidney disease, presently stage IV. 4.  Conduction system disease, underlying first-degree AV block.  Prior history of Mobitz 2 AV block, resolved with reducing dose of beta-blocker.  Recommendation: I will start him back on low-dose metoprolol succinate 25 mg daily.  Continue hydralazine and isosorbide dinitrate, contraindication for ACE inhibitors or ARB in view of renal failure.  Renal issues are being managed by nephrology.  No clinical evidence of heart failure, he has underlying medical issues that still needs inpatient care.  We will see him back in the outpatient basis unless cardiac issues were to arise.  Suspect his LVEF has probably improved, will repeat echocardiogram in the outpatient basis.   Adrian Prows, MD, [facility] 04/10/2020, 12:41 PM Office: [phone] Pager: [phone]

## 2020-04-10 NOTE — Progress Notes (Signed)
OT Cancellation Note  Patient Details Name: Brian Wolf MRN: [id number] DOB: 1931/02/15   Cancelled Treatment:    Reason Eval/Treat Not Completed: Patient at procedure or test/ unavailable. Pt off unit, OT will follow up next available time  [name] 04/10/2020, 10:18 AM

## 2020-04-10 NOTE — Progress Notes (Signed)
Modified Barium Swallow Progress Note  Patient Details  Name: Brian Wolf MRN: [id number] Date of Birth: Jun 12, 1931  Today's Date: 04/10/2020  Modified Barium Swallow completed.  Full report located under Chart Review in the Imaging Section.  Brief recommendations include the following:  Clinical Impression  Pt demonstrates mild oropharyngeal dysphagia with primary problem being instances of sensed and silent aspiration because the swallow with thin liquids. Pt piecemeals most bolus with premature spillage and slight delay in swallow initiation. [name] allows bolus to be captured in the vallculae protecting airway from spillage. There is also mild vallcular residue with solids though this clear with a liquid wash. Recommend regular diet and thin liquids with [name] as pt was able to follow this strategy well today. Nectar thick liquids are also effective, but should only be utilized in times of decreased ability to follow Brian Wolf (acute illness/increasing cognitive decline).   Swallow Evaluation Recommendations       SLP Diet Recommendations: Regular solids;Thin liquid   Liquid Administration via: Cup;Straw   Medication Administration: Whole meds with puree   Supervision: Full supervision/cueing for compensatory strategies   Compensations: Slow rate;Small sips/bites;Brian Wolf;Follow solids with liquid   Postural Changes: Seated upright at 90 degrees   Oral Care Recommendations: Oral care BID       Brian Baltimore, MA [place] Pager [phone] Office [phone]  Jahmiyah Dullea, Katherene Ponto 04/10/2020,10:57 AM

## 2020-04-10 NOTE — Progress Notes (Signed)
Nephrology Follow-Up Consult note   Assessment/Recommendations: Brian Wolf is a/an 87 y.o. male with a past medical history significant for HTN, CVA, CKD, GI bleeding, OSA who present w/ weakness and GI bleed now w/ AKI on CKD  Non-Oliguric AKI on CKD, unclear stage 3b/IV: Likely baseline arterionephrosclerosis but unclear what his baseline GFR is.  May be beneficial to access outpatient records.  Most likely around 1.5-2 but could be higher.  Creatinine on arrival was 2.6.  Renal ultrasound without significant concern but bilateral cysts.  Urinalysis with some pyuria and proteinuria with positive nitrates.  Urine sodium less than 10.  Given he is not volume overloaded I think this represents dehydration -Signs of urinary tract infection, receiving empirical treatment with Augmentin right now (also covering pulmonary source).  Follow-up urine culture -IV hydration with lactated Ringer's 100 cc/h for 10 hours -Continue to monitor daily Cr, Dose meds for GFR -Monitor Daily I/Os, Daily weight  -Maintain MAP>65 for optimal renal perfusion.  -Avoid nephrotoxic medications including NSAIDs and Vanc/Zosyn combo -Currently no indication for HD  Hypertension: Continue current medications as prescribed.  Blood pressure acceptable  Aspiration pneumonitis/pneumonia: Possible infection on antibiotics per primary team  Acute hypoxic respiratory failure: Likely multifactorial with pleural effusion and possible aspiration pneumonitis with reactive airway disease contributing.    Has responded to nebulizers as well as thoracentesis  Metabolic acidosis: Likely associated with AKI.    Has improved with supplementation.  Continue monitor bicarb and stop tomorrow if bicarb remains within normal limits  Anemia/GI bleed: Hemoglobin 0.6 today.  GI bleed on arrival.  Iron depleted.  Receiving total of 1 g.  No ESA at this time.   Recommendations conveyed to primary service.    [facility] 04/10/2020 1:14 PM  ___________________________________________________________  CC: AKI on CKD  Interval History/Subjective: Patient continues to slowly improve.  Creatinine stable today.  Patient feels okay.   Medications:  Current Facility-Administered Medications  Medication Dose Route Frequency Provider Last Rate Last Admin  . feeding supplement (NEPRO CARB STEADY) liquid 237 mL  237 mL Oral BID BM Lavina Hamman, MD   237 mL at 04/10/20 0920  . ferric gluconate (NULECIT) 250 mg in sodium chloride 0.9 % 100 mL IVPB  250 mg Intravenous Daily Reesa Chew, MD 120 mL/hr at 04/10/20 0901 250 mg at 04/10/20 0901  . hydrALAZINE (APRESOLINE) tablet 50 mg  50 mg Oral TID Ronald Lobo, MD   50 mg at 04/10/20 0855  . ipratropium-albuterol (DUONEB) 0.5-2.5 (3) MG/3ML nebulizer solution 3 mL  3 mL Nebulization Q6H PRN Lovey Newcomer T, NP   3 mL at 04/07/20 0617  . isosorbide dinitrate (ISORDIL) tablet 30 mg  30 mg Oral TID Ronald Lobo, MD   30 mg at 04/10/20 0855  . lactated ringers infusion   Intravenous Continuous Reesa Chew, MD 100 mL/hr at 04/10/20 1053 New Bag at 04/10/20 1053  . lactulose (CHRONULAC) 10 GM/15ML solution 10 g  10 g Oral Daily Lavina Hamman, MD      . latanoprost (XALATAN) 0.005 % ophthalmic solution 1 drop  1 drop Both Eyes QHS Johnson, Clanford L, MD   1 drop at 04/09/20 2228  . loratadine (CLARITIN) tablet 10 mg  10 mg Oral Daily PRN Johnson, Clanford L, MD      . MEDLINE mouth rinse  15 mL Mouth Rinse BID Johnson, Clanford L, MD   15 mL at 04/10/20 0859  . metoprolol succinate (  TOPROL-XL) 24 hr tablet 25 mg  25 mg Oral Daily Adrian Prows, MD      . pantoprazole (PROTONIX) EC tablet 40 mg  40 mg Oral BID AC Ronald Lobo, MD   40 mg at 04/10/20 0853  . saccharomyces boulardii (FLORASTOR) capsule 250 mg  250 mg Oral BID Wynetta Emery, Clanford L, MD   250 mg at 04/10/20 0852  . simvastatin (ZOCOR) tablet 40 mg  40 mg Oral  Daily Buccini, [name], MD   40 mg at 04/09/20 2225  . sodium bicarbonate tablet 650 mg  650 mg Oral BID Reesa Chew, MD   650 mg at 04/10/20 0858  . sodium chloride flush (NS) 0.9 % injection 3 mL  3 mL Intravenous Q12H Buccini, Robert, MD   3 mL at 04/10/20 0900  . sucralfate (CARAFATE) 1 GM/10ML suspension 1 g  1 g Oral TID WC & HS Buccini, Robert, MD   1 g at 04/10/20 0856  . tamsulosin (FLOMAX) capsule 0.4 mg  0.4 mg Oral Daily Johnson, Clanford L, MD   0.4 mg at 04/09/20 2224      Review of Systems: 10 systems reviewed and negative except per interval history/subjective  Physical Exam: Vitals:   04/10/20 0305 04/10/20 0854  BP: (!) 132/59 139/67  Pulse: 66 77  Resp: 18 17  Temp: 97.9 F (36.6 C) 98.1 F (36.7 C)  SpO2: 98% 91%   No intake/output data recorded.  Intake/Output Summary (Last 24 hours) at 04/10/2020 1314 Last data filed at 04/09/2020 1721 Gross per 24 hour  Intake 1.77 ml  Output 150 ml  Net -148.23 ml    General: Chronically ill-appearing, sitting in bed, no distress HEENT: anicteric sclera,  moist mucous membranes CV: Normal rate, no audible murmurs, trace edema in ankles Lungs:  Clear to auscultation bilaterally, no increased work of breathing Abd: soft, non-tender, non-distended Skin: no visible lesions or rashes Neuro, awake, alert, answers questions appropriately   Test Results I personally reviewed new and old clinical labs and radiology tests Lab Results  Component Value Date   NA 136 04/10/2020   K 3.9 04/10/2020   CL 105 04/10/2020   CO2 22 04/10/2020   BUN 81 (H) 04/10/2020   CREATININE 3.75 (H) 04/10/2020   CALCIUM 7.8 (L) 04/10/2020   ALBUMIN 2.1 (L) 04/10/2020   PHOS 4.4 04/10/2020

## 2020-04-10 NOTE — Progress Notes (Signed)
[facility] Progress Note  Patient: Brian Wolf    IOE:[id number]  DOA: 04/05/2020     Date of Service: the patient was seen and examined on 04/10/2020  Brief hospital course: Past medical history of HTN, CVA, CKD, OSA on CPAP, sinus node dysfunction without a pacemaker.  Presents with severe anemia found to have duodenal ulcer on EGD.  Now appears to be having volume overload requiring IV diuresis.  This resulted in worsening renal function and now appears to have metabolic encephalopathy from uremia. Currently plan is monitor improvement in renal function and monitor for volume overload as receiving IV fluids  Assessment and Plan: 1.  Acute upper GI bleed secondary to duodenal ulcer Acute blood loss anemia Chronic active gastritis EGD 3/6 shows nonbleeding duodenal ulcer.  On PPI 40 mg daily indefinitely. Pathology positive for H. pylori but Warthin-Starry stain negative. We will discuss with GI regarding treatment. Baseline hemoglobin around 9-10. On admission hemoglobin 6.7. SP 2 PRBC transfusion.  Currently H&H relatively stable. Hemoglobin erroneously low on 3/9 morning at 7.7. Repeat hemoglobin 8.6 which is where he has been for last 48 hours. Reticulocyte count elevated suggesting acute anemia. Iron level low. INR normal. For now continue PPI p.o. and monitor H&H daily. Transfuse for hemoglobin less than 7. Discussed with GI regarding H. pylori diagnosis.  2.  AKI on CKD stage IV Metabolic acidosis Baseline serum creatinine around 2. Now appears to be progressively worsening. Currently serum creatinine 3.6.  BUN about 72. Mild worsening of mental status from baseline likely associated with his hospital stay as well as worsening uremia. Nephrology consulted. Renal ultrasound shows no obstruction. Hold diuretics. Urine sodium less than 10. Avoid nephrotoxic medication.  3.  Acute on chronic combined diastolic and systolic CHF Right pleural  effusion Echocardiogram on 3/6 EF 35 to 40%. Grade 1 diastolic dysfunction. Received IV diuresis with worsening renal function therefore currently diuresis on hold. Chest x-ray showed evidence of right-sided pleural effusion. Underwent thoracentesis with removal of 1.2 L of fluid. Fluid analysis consistent with transudative fluid. Repeat x-ray shows improvement in volume status.  Monitor for reaccumulation.  4.  Concern for pneumonia, ruled out There was initial concern for consolidation on the right side with aspiration. Currently significant improvement in chest x-ray after removal of the fluid therefore pneumonia ruled out.  No antibiotics.  5.  Atelectasis. OSA Recommended patient to remain compliant with CPAP here in the hospital.  Patient currently agreeable.  6.  Acute metabolic encephalopathy Mild worsening per family likely in the setting of procedures, anesthesia and uremia. Monitor.  Avoid psychotropic medication.  7.  HLD. Continue statin.  8.  BPH. Continue Flomax.  9.  Constipation. Lactulose for bowel regimen.  10.  Essential hypertension. Blood pressure stable. Continue current regimen although avoiding hypotensive events. Holding Aldactone and losartan in the setting of AKI.  11.  History of CVA. On aspirin prior to admission.  Currently on hold secondary to duodenal ulcer. We will discuss with GI regarding timing of resumption.  12.  Glaucoma. Continue home eyedrops.  Diet: Cardiac diet DVT Prophylaxis:   SCDs Start: 04/05/20 1053    Advance goals of care discussion: DNR  Family Communication: family was present at bedside, at the time of interview.  The pt provided permission to discuss medical plan with the family. Opportunity was given to ask question and all questions were answered satisfactorily.  Wife would not like any other family members to receive patient's information.  We will refer  other family members to wife for patient's  information.  Disposition:  Status is: Inpatient  Remains inpatient appropriate because:IV treatments appropriate due to intensity of illness or inability to take PO   Dispo: The patient is from: Home              Anticipated d/c is to: SNF              Patient currently is not medically stable to d/c.   Difficult to place patient No        Subjective: No nausea no vomiting.  No fever no chills.  Not sure whether he used his CPAP last night.  Physical Exam:  General: Appear in mild distress, no Rash; Oral Mucosa Clear, moist. no Abnormal Neck Mass Or lumps, Conjunctiva normal  Cardiovascular: S1 and S2 Present, no Murmur, Respiratory: good respiratory effort, Bilateral Air entry present and FAINT Crackles, no wheezes Abdomen: Bowel Sound present, Soft and no tenderness Extremities: no Pedal edema Neurology: alert and oriented to time, place, and person affect appropriate. no new focal deficit, no asterixis present Gait not checked due to patient safety concerns   Vitals:   04/09/20 2047 04/10/20 0305 04/10/20 0854 04/10/20 1455  BP: (!) 126/54 (!) 132/59 139/67 (!) 146/69  Pulse: 74 66 77 75  Resp: 16 18 17 17   Temp: 98.1 F (36.7 C) 97.9 F (36.6 C) 98.1 F (36.7 C) 97.9 F (36.6 C)  TempSrc: Oral Oral Oral Oral  SpO2: 91% 98% 91% 94%  Weight:      Height:        Intake/Output Summary (Last 24 hours) at 04/10/2020 1831 Last data filed at 04/10/2020 1600 Gross per 24 hour  Intake 510.36 ml  Output --  Net 510.36 ml   Filed Weights   04/05/20 0641  Weight: 69.4 kg    Data Reviewed: I have personally reviewed and interpreted daily labs, tele strips, imaging. I reviewed all nursing notes, pharmacy notes, vitals, pertinent old records I have discussed plan of care as described above with RN and patient/family.  CBC: Recent Labs  Lab 04/05/20 0729 04/05/20 1604 04/06/20 1610 04/07/20 0252 04/08/20 0144 04/09/20 0251 04/09/20 1208  WBC 10.4   < >  11.6* 11.0* 9.4 12.1* 15.1*  NEUTROABS 6.9  --   --   --   --   --   --   HGB 6.7*   < > 9.2* 8.4* 8.9* 7.7* 8.6*  HCT 21.3*   < > 28.2* 26.2* 27.3* 24.1* 26.2*  MCV 96.4   < > 92.5 93.6 93.2 93.8 93.2  PLT 251   < > 302 300 336 302 359   < > = values in this interval not displayed.   Basic Metabolic Panel: Recent Labs  Lab 04/06/20 0642 04/07/20 0252 04/08/20 0144 04/09/20 0251 04/10/20 0348  NA 140 139 136 134* 136  K 4.5 4.3 4.4 4.3 3.9  CL 110 109 109 104 105  CO2 17* 20* 17* 20* 22  GLUCOSE 83 122* 165* 122* 105*  BUN 69* 65* 66* 72* 81*  CREATININE 2.77* 2.96* 3.21* 3.67* 3.75*  CALCIUM 8.1* 8.1* 8.1* 7.4* 7.8*  MG  --   --   --  2.2  --   PHOS  --  5.1* 4.8* 5.3* 4.4    Studies: No results found.  Scheduled Meds:  feeding supplement (NEPRO CARB STEADY)  237 mL Oral BID BM   hydrALAZINE  50 mg Oral TID   isosorbide  dinitrate  30 mg Oral TID   lactulose  10 g Oral Daily   latanoprost  1 drop Both Eyes QHS   mouth rinse  15 mL Mouth Rinse BID   metoprolol succinate  25 mg Oral Daily   pantoprazole  40 mg Oral BID AC   saccharomyces boulardii  250 mg Oral BID   simvastatin  40 mg Oral Daily   sodium bicarbonate  650 mg Oral BID   sodium chloride flush  3 mL Intravenous Q12H   sucralfate  1 g Oral TID WC & HS   tamsulosin  0.4 mg Oral Daily   Continuous Infusions:  ferric gluconate (FERRLECIT/NULECIT) IV 250 mg (04/10/20 0901)   PRN Meds: ipratropium-albuterol, loratadine  Time spent: 35 minutes  Author: Berle Mull, MD [facility] 04/10/2020 6:31 PM  To reach On-call, see care teams to locate the attending and reach out via [email or web address]. Between 7PM-7AM, please contact night-coverage If you still have difficulty reaching the attending provider, please page the [facility] (Director on Call) for [facility] on amion for assistance.

## 2020-04-10 NOTE — Progress Notes (Signed)
RT spoke with patient about wearing CPAP and if he wears at home.  Patient didn't agree to wear CPAP at this time.  RT will continue to monitor.

## 2020-04-10 NOTE — Plan of Care (Signed)

## 2020-04-10 NOTE — Care Management Important Message (Signed)
Important Message  Patient Details  Name: Brian Wolf MRN: [id number] Date of Birth: 1932/01/04   Medicare Important Message Given:  Yes     [name] 04/10/2020, 2:03 PM

## 2020-04-11 ENCOUNTER — Inpatient Hospital Stay (HOSPITAL_COMMUNITY): Payer: Medicare Other

## 2020-04-11 DIAGNOSIS — K922 Gastrointestinal hemorrhage, unspecified: Secondary | ICD-10-CM | POA: Diagnosis not present

## 2020-04-11 LAB — RENAL FUNCTION PANEL
Albumin: 2.1 g/dL — ABNORMAL LOW (ref 3.5–5.0)
Anion gap: 9 (ref 5–15)
BUN: 74 mg/dL — ABNORMAL HIGH (ref 8–23)
CO2: 21 mmol/L — ABNORMAL LOW (ref 22–32)
Calcium: 7.7 mg/dL — ABNORMAL LOW (ref 8.9–10.3)
Chloride: 107 mmol/L (ref 98–111)
Creatinine, Ser: 3.42 mg/dL — ABNORMAL HIGH (ref 0.61–1.24)
GFR, Estimated: 17 mL/min — ABNORMAL LOW (ref >60)
Glucose, Bld: 103 mg/dL — ABNORMAL HIGH (ref 70–99)
Phosphorus: 3.4 mg/dL (ref 2.5–4.6)
Potassium: 3.3 mmol/L — ABNORMAL LOW (ref 3.5–5.1)
Sodium: 137 mmol/L (ref 135–145)

## 2020-04-11 LAB — SURGICAL PATHOLOGY

## 2020-04-11 MED ORDER — AMOXICILLIN-POT CLAVULANATE 500-125 MG PO TABS
1.0000 | ORAL_TABLET | Freq: Two times a day (BID) | ORAL | Status: DC
  Administered 2020-04-11 – 2020-04-12 (×2): 500 mg via ORAL
  Filled 2020-04-11 (×4): qty 1

## 2020-04-11 MED ORDER — ALBUMIN HUMAN 25 % IV SOLN
12.5000 g | Freq: Once | INTRAVENOUS | Status: AC
  Administered 2020-04-11: 12.5 g via INTRAVENOUS
  Filled 2020-04-11: qty 50

## 2020-04-11 NOTE — Progress Notes (Signed)
OT Cancellation Note  Patient Details Name: Braylan Faul MRN: [id number] DOB: 06-08-1931   Cancelled Treatment:    Reason Eval/Treat Not Completed: Other (comment) Pts wife feeding pt breakfast upon arrival requesting this COTA come back for session, will continue efforts as time allows.  Harley Alto., COTA/L Acute Rehabilitation Services [phone]   Precious Haws 04/11/2020, 8:18 AM

## 2020-04-11 NOTE — Progress Notes (Signed)
Dr. Posey Pronto noted a discrepancy in the patient's recent Path rept regarding his gastric bx's, specifically, whether or not H. Pylori organisms were present.  I spoke w/ Dr. Thressa Sheller of Pathology, who re-looked at the slide and confirmed that H. Pylori organisms are NOT present; moreover, the bx appearance is not typical for that seen w/ H pylori infection.  Therefore, it remains the case that this patient does NOT need treatment for H. Pylori.  Cleotis Nipper, M.D. Pager [phone] If no answer or after 5 PM call [phone]

## 2020-04-11 NOTE — Plan of Care (Signed)

## 2020-04-11 NOTE — Progress Notes (Signed)
[facility] Progress Note  Patient: Brian Wolf    VHQ:[id number]  DOA: 04/05/2020     Date of Service: the patient was seen and examined on 04/11/2020  Brief hospital course: Past medical history of HTN, CVA, CKD, OSA on CPAP, sinus node dysfunction without a pacemaker.  Presents with severe anemia found to have duodenal ulcer on EGD.  Now appears to be having volume overload requiring IV diuresis.  This resulted in worsening renal function and now appears to have metabolic encephalopathy from uremia. Currently plan is monitor improvement in renal function and monitor for volume overload as receiving IV fluids  Assessment and Plan: 1.  Acute upper GI bleed secondary to duodenal ulcer Acute blood loss anemia Chronic active gastritis EGD 3/6 shows nonbleeding duodenal ulcer.  On PPI 40 mg daily indefinitely. Baseline hemoglobin around 9-10. On admission hemoglobin 6.7. SP 2 PRBC transfusion.  Currently H&H relatively stable. Hemoglobin erroneously low on 3/9 morning at 7.7. Repeat hemoglobin 8.6 which is where he has been for last 48 hours. Reticulocyte count elevated suggesting acute anemia. Iron level low. INR normal. For now continue PPI p.o. and monitor H&H daily. Transfuse for hemoglobin less than 7. Verified with the GI and pathology.  H. pylori actually is negative.  No further treatment.  2.  AKI on CKD stage IV Metabolic acidosis Baseline serum creatinine around 2. Now appears to be progressively worsening. Currently serum creatinine 3.6.  BUN about 72. Mild worsening of mental status from baseline likely associated with his hospital stay as well as worsening uremia. Nephrology consulted. Renal ultrasound shows no obstruction. Hold diuretics. Urine sodium less than 10. Avoid nephrotoxic medication.  3.  Acute on chronic combined diastolic and systolic CHF Right pleural effusion Echocardiogram on 3/6 EF 35 to 40%. Grade 1 diastolic dysfunction. Received IV  diuresis with worsening renal function therefore currently diuresis on hold. Chest x-ray showed evidence of right-sided pleural effusion. Underwent thoracentesis with removal of 1.2 L of fluid. Fluid analysis consistent with transudative fluid. Repeat x-ray shows improvement in volume status. CXR on 3/11 shows stable pleural effusion. Monitor for reaccumulation.  4.  Aspiration pneumonia. UTI. There was initial concern for consolidation on the right side with aspiration. Currently significant improvement in chest x-ray after removal of the fluid. Due to ongoing cough with greenish sputum will initiate antibiotic. Also urine growing gram-negative rods.  5.  Atelectasis. OSA Recommended patient to remain compliant with CPAP here in the hospital.  Patient currently agreeable. NSAIDs spirometry highly recommended and educated with the patient.  6.  Acute metabolic encephalopathy Mild worsening per family likely in the setting of procedures, anesthesia and uremia. Monitor.  Avoid psychotropic medication. Currently does not have any insight in his medical condition. Prognosis remains very poor due to the patient's cooperation with his medical treatment. Palliative care consulted.  7.  HLD. Continue statin.  8.  BPH. Continue Flomax.  9.  Constipation. Lactulose for bowel regimen.  10.  Essential hypertension. Blood pressure stable. Continue current regimen although avoiding hypotensive events. Holding Aldactone and losartan in the setting of AKI.  11.  History of CVA. On aspirin prior to admission.  Currently on hold secondary to duodenal ulcer. We will discuss with GI regarding timing of resumption.  12.  Glaucoma. Continue home eyedrops.  Diet: Cardiac diet DVT Prophylaxis:   SCDs Start: 04/05/20 1053    Advance goals of care discussion: DNR discussed in detail regarding patient's poor prognosis with multiple comorbidities. Palliative care consult was  recommended  which wife agreed.  Family Communication: Wife was present at bedside, at the time of interview.  Recommend her to involve rest the family member at decision-making process. The pt provided permission to discuss medical plan with the family. Opportunity was given to ask question and all questions were answered satisfactorily.  Wife would not like any other family members to receive patient's information.  We will refer other family members to wife for patient's information.  Disposition:  Status is: Inpatient  Remains inpatient appropriate because:IV treatments appropriate due to intensity of illness or inability to take PO   Dispo: The patient is from: Home              Anticipated d/c is to: SNF              Patient currently is not medically stable to d/c.   Difficult to place patient No  Subjective: No nausea no vomiting.  No fever no chills.  No chest pain.  Abdominal pain.  No diarrhea no constipation.  Continues to have cough with greenish expectoration.  Minimal oral intake.  Physical Exam:  General: Appear in mild distress, no Rash; Oral Mucosa Clear, moist. no Abnormal Neck Mass Or lumps, Conjunctiva normal  Cardiovascular: S1 and S2 Present, no Murmur, Respiratory: increased respiratory effort, Bilateral Air entry present and bilateral  Crackles, no wheezes Abdomen: Bowel Sound present, Soft and no tenderness Extremities: trace Pedal edema Neurology: alert and oriented to place, and person affect appropriate. no new focal deficit.  No asterixis Gait not checked due to patient safety concerns   Vitals:   04/10/20 2007 04/10/20 2232 04/11/20 0437 04/11/20 0809  BP: (!) 113/45 (!) 114/52 (!) 124/57 (!) 122/48  Pulse: 69  62 66  Resp: 18  19 16   Temp: 99.2 F (37.3 C)  98.2 F (36.8 C) 98.2 F (36.8 C)  TempSrc: Oral  Oral Oral  SpO2: 93%  92% 93%  Weight:      Height:        Intake/Output Summary (Last 24 hours) at 04/11/2020 1958 Last data filed at 04/11/2020  1843 Gross per 24 hour  Intake 458.43 ml  Output --  Net 458.43 ml   Filed Weights   04/05/20 0641  Weight: 69.4 kg    Data Reviewed: I have personally reviewed and interpreted daily labs, tele strips, imaging. I reviewed all nursing notes, pharmacy notes, vitals, pertinent old records I have discussed plan of care as described above with RN and patient/family.  CBC: Recent Labs  Lab 04/05/20 0729 04/05/20 1604 04/06/20 1610 04/07/20 0252 04/08/20 0144 04/09/20 0251 04/09/20 1208  WBC 10.4   < > 11.6* 11.0* 9.4 12.1* 15.1*  NEUTROABS 6.9  --   --   --   --   --   --   HGB 6.7*   < > 9.2* 8.4* 8.9* 7.7* 8.6*  HCT 21.3*   < > 28.2* 26.2* 27.3* 24.1* 26.2*  MCV 96.4   < > 92.5 93.6 93.2 93.8 93.2  PLT 251   < > 302 300 336 302 359   < > = values in this interval not displayed.   Basic Metabolic Panel: Recent Labs  Lab 04/07/20 0252 04/08/20 0144 04/09/20 0251 04/10/20 0348 04/11/20 0233  NA 139 136 134* 136 137  K 4.3 4.4 4.3 3.9 3.3*  CL 109 109 104 105 107  CO2 20* 17* 20* 22 21*  GLUCOSE 122* 165* 122* 105* 103*  BUN 65*  66* 72* 81* 74*  CREATININE 2.96* 3.21* 3.67* 3.75* 3.42*  CALCIUM 8.1* 8.1* 7.4* 7.8* 7.7*  MG  --   --  2.2  --   --   PHOS 5.1* 4.8* 5.3* 4.4 3.4    Studies: DG CHEST PORT 1 VIEW  Result Date: 04/11/2020 CLINICAL DATA:  Cough.  Pleural effusion. EXAM: PORTABLE CHEST 1 VIEW COMPARISON:  April 08, 2020 FINDINGS: Pleural effusion on each side, essentially stable compared to recent study. Bibasilar atelectasis noted. There is cardiomegaly with pulmonary vascularity normal. No adenopathy. There is aortic atherosclerosis. No bone lesions. IMPRESSION: Essentially stable pleural effusions bilaterally with bibasilar atelectasis. Stable cardiac enlargement. No new opacity evident. Aortic Atherosclerosis (ICD10-I70.0). Electronically Signed   By: Lowella Grip III M.D.   On: 04/11/2020 09:48    Scheduled Meds: . amoxicillin-clavulanate  1 tablet  Oral BID  . feeding supplement (NEPRO CARB STEADY)  237 mL Oral BID BM  . hydrALAZINE  50 mg Oral TID  . isosorbide dinitrate  30 mg Oral TID  . lactulose  10 g Oral Daily  . latanoprost  1 drop Both Eyes QHS  . mouth rinse  15 mL Mouth Rinse BID  . metoprolol succinate  25 mg Oral Daily  . pantoprazole  40 mg Oral BID AC  . saccharomyces boulardii  250 mg Oral BID  . simvastatin  40 mg Oral Daily  . sodium bicarbonate  650 mg Oral BID  . sodium chloride flush  3 mL Intravenous Q12H  . sucralfate  1 g Oral TID WC & HS  . tamsulosin  0.4 mg Oral Daily   Continuous Infusions: . ferric gluconate (FERRLECIT/NULECIT) IV 250 mg (04/11/20 1601)   PRN Meds: ipratropium-albuterol, loratadine  Time spent: 35 minutes  Author: Berle Mull, MD [facility] 04/11/2020 7:58 PM  To reach On-call, see care teams to locate the attending and reach out via [email or web address]. Between 7PM-7AM, please contact night-coverage If you still have difficulty reaching the attending provider, please page the [facility] (Director on Call) for [facility] on amion for assistance.

## 2020-04-11 NOTE — Consult Note (Signed)
   [facility] CM Inpatient Consult   04/11/2020  Brian Wolf September 08, 1931 [id number]  [facility] [ACO] Patient: [name]  Patient screened for hospitalization with noted high risk score for unplanned readmission risk and to assess progress for transition of care needs and for potential [facility] services.  Review of patient's medical record reveals patient is for home with home health as per recommendations of inpatient  PT/OT/ST progress notes.  PCP: [name], Do of [facility] is listed to provide the transition of care follow up for post hospital needs.  Plan:  Continue to follow progress and disposition to assess for post hospital care management needs.    For questions contact:   Natividad Brood, RN BSN [facility] Liaison  [phone] business mobile phone Toll free office [phone]  Fax number: [phone] [email or web address]

## 2020-04-11 NOTE — Plan of Care (Signed)
  Problem: Clinical Measurements: Goal: Will remain free from infection Outcome: Progressing Goal: Diagnostic test results will improve Outcome: Progressing   Problem: Nutrition: Goal: Adequate nutrition will be maintained Outcome: Progressing   

## 2020-04-11 NOTE — Progress Notes (Signed)
Occupational Therapy Treatment Patient Details Name: Brian Wolf MRN: [id number] DOB: 1931/04/10 Today's Date: 04/11/2020    History of present illness Pt is an 87 y/o male admitted 3/5 secondary to dark stools; thought to be secondary to GI bleed. Pt is s/p EGD on 3/6 that showed large duodenal ulcer. Since admission, pt has developed wheezing as well. Thought to be secondary to CHF and possible aspiration PNA. Pt is s/p thoracentesis on 3/8. PMH includes HTN, CVA, CKD, and OSA on CPAP.   OT comments  Pt making steady progress towards OT goals this session. Pt continues to present with decreased ability to care for self, decreased activity tolerance and generalized deconditioning. Pt noted to be wet upon arrival, MOD A +1 to roll R<>L to change bed pads. MAX A +1 to transition to EOB and MIN A +1 to pivot to recliner. Pt completed full bath from recliner needing MAX A for UB/ LB ADLS although pts wife reports he was completing ADLS independently at home. Pt would continue to benefit from skilled occupational therapy while admitted and after d/c to address the below listed limitations in order to improve overall functional mobility and facilitate independence with BADL participation. DC plan remains appropriate, will follow acutely per POC.     Follow Up Recommendations  Home health OT;Supervision - Intermittent    Equipment Recommendations  Other (comment) (RW)    Recommendations for Other Services      Precautions / Restrictions Precautions Precautions: Fall Restrictions Weight Bearing Restrictions: No       Mobility Bed Mobility Overal bed mobility: Needs Assistance Bed Mobility: Rolling Rolling: Mod assist   Supine to sit: Max assist;HOB elevated     General bed mobility comments: MAX A +1 to exit to R side of pts bed, pt requried assist to elevate trunk and fullly scoot hips to EOB with use of bed pad, MOD A to roll R<>L for pericare and to change bed pad     Transfers Overall transfer level: Needs assistance Equipment used: 1 person hand held assist Transfers: Sit to/from [facility] Sit to Stand: Min assist Stand pivot transfers: Min assist       General transfer comment: MIN A to rise from EOB and MINA to pivot to recliner to pts R side with face-to-face assistance and use of gait belt    Balance Overall balance assessment: Needs assistance Sitting-balance support: No upper extremity supported;Feet supported Sitting balance-[name]: Fair Sitting balance - Comments: close supervision   Standing balance support: Bilateral upper extremity supported;During functional activity Standing balance-[name]: Poor Standing balance comment: reliant on external assist                           ADL either performed or assessed with clinical judgement   ADL Overall ADL's : Needs assistance/impaired         Upper Body Bathing: Maximal assistance;Sitting Upper Body Bathing Details (indicate cue type and reason): from chair, pt initially attempting to wash chest but needed MAX A overall for cleanliness Lower Body Bathing: Maximal assistance;Total assistance;Bed level;Sitting/lateral leans Lower Body Bathing Details (indicate cue type and reason): total A to was periarea from bed level, MAX A for LBB from sitting in recliner Upper Body Dressing : Maximal assistance;Sitting Upper Body Dressing Details (indicate cue type and reason): to don gown Lower Body Dressing: Moderate assistance Lower Body Dressing Details (indicate cue type and reason): pt able to don L sock  but required assist for R sock Toilet Transfer: Minimal assistance;Stand-pivot Toilet Transfer Details (indicate cue type and reason): simulated via stand pivot transfer from EOB>recliner Toileting- Clothing Manipulation and Hygiene: Total assistance;Bed level Toileting - Clothing Manipulation Details (indicate cue type and reason): total A for  posterior pericare from bed level     Functional mobility during ADLs: Minimal assistance General ADL Comments: pt continues to present with decreased ability to care for self, decreased activity tolerance and generalized deconditioning. Pt able to mobilize OOB to recliner to complete bathing tasks     Vision       Perception     Praxis      Cognition Arousal/Alertness: Awake/alert Behavior During Therapy: Flat affect Overall Cognitive Status: Impaired/Different from baseline Area of Impairment: Awareness;Safety/judgement;Problem solving;Following commands                       Following Commands: Follows one step commands with increased time Safety/Judgement: Decreased awareness of deficits;Decreased awareness of safety Awareness: Emergent Problem Solving: Slow processing General Comments: pt very flat but overall pleasant, pt making minimal efforts to communicate with COTA but pt does respond appropriately with increased time        Exercises     Shoulder Instructions       General Comments Pt's wife present during session    Pertinent Vitals/ Pain       Pain Assessment: Faces Faces Pain Scale: No hurt  Home Living                                          Prior Functioning/Environment              Frequency  Min 2X/week        Progress Toward Goals  OT Goals(current goals can now be found in the care plan section)  Progress towards OT goals: Progressing toward goals  Acute Rehab OT Goals OT Goal Formulation: Patient unable to participate in goal setting Time For Goal Achievement: 04/22/20 Potential to Achieve Goals: [place] Discharge plan remains appropriate;Frequency remains appropriate    Co-evaluation                 AM-PAC OT "6 Clicks" Daily Activity     Outcome Measure   Help from another person eating meals?: A Little Help from another person taking care of personal grooming?: A Little Help from  another person toileting, which includes using toliet, bedpan, or urinal?: A Lot Help from another person bathing (including washing, rinsing, drying)?: A Lot Help from another person to put on and taking off regular upper body clothing?: A Lot Help from another person to put on and taking off regular lower body clothing?: A Lot 6 Click Score: 14    End of Session Equipment Utilized During Treatment: Gait belt  OT Visit Diagnosis: Unsteadiness on feet (R26.81);Muscle weakness (generalized) (M62.81)   Activity Tolerance Patient tolerated treatment well   Patient Left in chair;with call bell/phone within reach;with chair alarm set   Nurse Communication Mobility status        Time: 7858-8502 OT Time Calculation (min): 36 min  Charges: OT General Charges $OT Visit: 1 Visit OT Treatments $Self Care/Home Management : 23-37 mins  Harley Alto., COTA/L Acute Rehabilitation Services [phone]    Precious Haws 04/11/2020, 12:07 PM

## 2020-04-11 NOTE — Progress Notes (Signed)
°  Speech Language Pathology Treatment: Dysphagia  Patient Details Name: Brian Wolf MRN: [id number] DOB: 30-May-1931 Today's Date: 04/11/2020 Time: 1010-1020 SLP Time Calculation (min) (ACUTE ONLY): 10 min  Assessment / Plan / Recommendation Clinical Impression  Pt upright in chair, Wife aware of Lozano tuck recommendation from sign, says she has been trying to implement and described supporting his head in bed in a forward position. Discussed importance of basic pulmonary hygiene in setting of mild dysphagia. Encouraged pt and wife to continue daily habit of brushing teeth to reduce bacterial lod in trace aspiration events. Demonstrated further Droz tuck strategies with pt and wife. Some coughing still noted after sips. Will f/u to reinforce as needed   HPI HPI: Pt is an 87 year old male who presented with recurrent ulcer disease in the absence of ulcerogenic medication exposure. Pt admitted with melena and anemia. EGD showed clean-based duodenal ulcer which is the presumed source of the patient's recent bleeding. Pt is on IV protonix. CXR showed  relatively stable cardiomegaly with right-sided pleural effusion with pulmonary vascular congestion and possible bibasilar atelectasis. Pt underwent and has improved significantly with antibiotics. Cardiologist writes "Chest x-ray has also demonstrated significant improvement and BNP has trended down since yesterday.  Patient appears euvolemic on exam.  Agree that patient's dyspnea likely primarily driven by aspiration pneumonia with pneumonitis and reactive airway disease, particularly in view of responsiveness to antibiotics and present pulmonary medications."  Wife denies observations of esophageal or pharyngeal dysphagia but reports his appetite has been poor. Only improved today. Pt has been been evalauted clinically by SLP in prior admissions, never found to have dysphagia.      SLP Plan  Continue with current plan of care       Recommendations   Diet recommendations: Regular;Thin liquid Liquids provided via: Cup;Straw Medication Administration: Whole meds with liquid Supervision: Trained caregiver to feed patient Compensations: Slow rate;Small sips/bites;Thatch tuck Postural Changes and/or Swallow Maneuvers: Seated upright 90 degrees                Follow up Recommendations: Home health SLP SLP Visit Diagnosis: Dysphagia, oropharyngeal phase (R13.12) Plan: Continue with current plan of care       GO               Brian Baltimore, MA [place] Pager [phone] Office [phone]  [name] 04/11/2020, 10:31 AM

## 2020-04-11 NOTE — Progress Notes (Signed)
Pt doesn't want to wear CPAP tonight. Pt is resting well at this time.

## 2020-04-11 NOTE — Progress Notes (Signed)
Nephrology Follow-Up Consult note   Assessment/Recommendations: Brian Wolf is a/an 87 y.o. male with a past medical history significant for HTN, CVA, CKD, GI bleeding, OSA who present w/ weakness and GI bleed now w/ AKI on CKD  Non-Oliguric AKI on CKD, unclear stage 3b/IV: Likely baseline arterionephrosclerosis but unclear baseline. Most likely around 1.5-2 but could be higher.  Creatinine on arrival was 2.6.  Renal ultrasound without significant concern but bilateral cysts.  Mostly felt to be dehydration with possible tubular injury -Hold IV hydration today and encourage oral hydration -Treatment of UTI as below -Continue to monitor daily Cr, Dose meds for GFR -Monitor Daily I/Os, Daily weight  -Maintain MAP>65 for optimal renal perfusion.  -Avoid nephrotoxic medications including NSAIDs and Vanc/Zosyn combo -Currently no indication for HD  Urinary tract infection: Urine culture with Proteus.  On Augmentin.  Follow-up susceptibility  Hypertension: Continue current medications as prescribed.  Blood pressure acceptable  Aspiration pneumonitis/pneumonia: Possible infection on antibiotics per primary team  Acute hypoxic respiratory failure: Likely multifactorial with pleural effusion and possible aspiration pneumonitis with reactive airway disease contributing.    Has responded to nebulizers as well as thoracentesis and antibiotics  Metabolic acidosis: Likely associated with AKI.    Has improved with supplementation.  Continue oral bicarbonate for now  Anemia/GI bleed: Hemoglobin 8.6.  Has received iron.  No ESA  Recommendations conveyed to primary service.    [facility] 04/11/2020 11:43 AM  ___________________________________________________________  CC: AKI on CKD  Interval History/Subjective: Patient tired today but no complaints.  Apparently some family issues overnight last night.  Making urine but not well documented.  Creatinine  improving   Medications:  Current Facility-Administered Medications  Medication Dose Route Frequency Provider Last Rate Last Admin  . albumin human 25 % solution 12.5 g  12.5 g Intravenous Once Lavina Hamman, MD      . amoxicillin-clavulanate (AUGMENTIN) 500-125 MG per tablet 500 mg  1 tablet Oral BID Lavina Hamman, MD      . feeding supplement (NEPRO CARB STEADY) liquid 237 mL  237 mL Oral BID BM Lavina Hamman, MD   237 mL at 04/11/20 1026  . ferric gluconate (NULECIT) 250 mg in sodium chloride 0.9 % 100 mL IVPB  250 mg Intravenous Daily Reesa Chew, MD 120 mL/hr at 04/10/20 0901 250 mg at 04/10/20 0901  . hydrALAZINE (APRESOLINE) tablet 50 mg  50 mg Oral TID Ronald Lobo, MD   50 mg at 04/11/20 1021  . ipratropium-albuterol (DUONEB) 0.5-2.5 (3) MG/3ML nebulizer solution 3 mL  3 mL Nebulization Q6H PRN Lovey Newcomer T, NP   3 mL at 04/07/20 0617  . isosorbide dinitrate (ISORDIL) tablet 30 mg  30 mg Oral TID Ronald Lobo, MD   30 mg at 04/11/20 1020  . lactulose (CHRONULAC) 10 GM/15ML solution 10 g  10 g Oral Daily Lavina Hamman, MD      . latanoprost (XALATAN) 0.005 % ophthalmic solution 1 drop  1 drop Both Eyes QHS Johnson, Clanford L, MD   1 drop at 04/10/20 2235  . loratadine (CLARITIN) tablet 10 mg  10 mg Oral Daily PRN Johnson, Clanford L, MD      . MEDLINE mouth rinse  15 mL Mouth Rinse BID Johnson, Clanford L, MD   15 mL at 04/11/20 1025  . metoprolol succinate (TOPROL-XL) 24 hr tablet 25 mg  25 mg Oral Daily Adrian Prows, MD   25 mg at 04/11/20 1022  .  pantoprazole (PROTONIX) EC tablet 40 mg  40 mg Oral BID AC Buccini, [name], MD   40 mg at 04/11/20 1022  . saccharomyces boulardii (FLORASTOR) capsule 250 mg  250 mg Oral BID Johnson, Clanford L, MD   250 mg at 04/11/20 1023  . simvastatin (ZOCOR) tablet 40 mg  40 mg Oral Daily Ronald Lobo, MD   40 mg at 04/10/20 2233  . sodium bicarbonate tablet 650 mg  650 mg Oral BID Reesa Chew, MD   650 mg at 04/11/20 1024   . sodium chloride flush (NS) 0.9 % injection 3 mL  3 mL Intravenous Q12H Buccini, Robert, MD   3 mL at 04/11/20 1026  . sucralfate (CARAFATE) 1 GM/10ML suspension 1 g  1 g Oral TID WC & HS Buccini, Robert, MD   1 g at 04/11/20 1025  . tamsulosin (FLOMAX) capsule 0.4 mg  0.4 mg Oral Daily Wynetta Emery, Clanford L, MD   0.4 mg at 04/10/20 2242      Review of Systems: 10 systems reviewed and negative except per interval history/subjective  Physical Exam: Vitals:   04/11/20 0437 04/11/20 0809  BP: (!) 124/57 (!) 122/48  Pulse: 62 66  Resp: 19 16  Temp: 98.2 F (36.8 C) 98.2 F (36.8 C)  SpO2: 92% 93%   Total I/O In: 120 [P.O.:120] Out: -   Intake/Output Summary (Last 24 hours) at 04/11/2020 1143 Last data filed at 04/11/2020 0800 Gross per 24 hour  Intake 748.79 ml  Output -  Net 748.79 ml    General: Chronically ill-appearing, sitting in chair, no distress HEENT: anicteric sclera,  moist mucous membranes CV: Normal rate, no audible murmurs Lungs:  Slight crackles in the bilateral bases, no increased work of breathing Abd: soft, non-tender, non-distended Skin: no visible lesions or rashes Neuro, awake, alert, answers questions appropriately   Test Results I personally reviewed new and old clinical labs and radiology tests Lab Results  Component Value Date   NA 137 04/11/2020   K 3.3 (L) 04/11/2020   CL 107 04/11/2020   CO2 21 (L) 04/11/2020   BUN 74 (H) 04/11/2020   CREATININE 3.42 (H) 04/11/2020   CALCIUM 7.7 (L) 04/11/2020   ALBUMIN 2.1 (L) 04/11/2020   PHOS 3.4 04/11/2020

## 2020-04-12 DIAGNOSIS — I5031 Acute diastolic (congestive) heart failure: Secondary | ICD-10-CM | POA: Diagnosis not present

## 2020-04-12 DIAGNOSIS — K922 Gastrointestinal hemorrhage, unspecified: Secondary | ICD-10-CM | POA: Diagnosis not present

## 2020-04-12 DIAGNOSIS — N179 Acute kidney failure, unspecified: Secondary | ICD-10-CM | POA: Diagnosis not present

## 2020-04-12 DIAGNOSIS — Z515 Encounter for palliative care: Secondary | ICD-10-CM | POA: Diagnosis not present

## 2020-04-12 LAB — RENAL FUNCTION PANEL
Albumin: 2.1 g/dL — ABNORMAL LOW (ref 3.5–5.0)
Anion gap: 7 (ref 5–15)
BUN: 69 mg/dL — ABNORMAL HIGH (ref 8–23)
CO2: 21 mmol/L — ABNORMAL LOW (ref 22–32)
Calcium: 7.5 mg/dL — ABNORMAL LOW (ref 8.9–10.3)
Chloride: 109 mmol/L (ref 98–111)
Creatinine, Ser: 3.15 mg/dL — ABNORMAL HIGH (ref 0.61–1.24)
GFR, Estimated: 18 mL/min — ABNORMAL LOW (ref >60)
Glucose, Bld: 98 mg/dL (ref 70–99)
Phosphorus: 3.2 mg/dL (ref 2.5–4.6)
Potassium: 3.9 mmol/L (ref 3.5–5.1)
Sodium: 137 mmol/L (ref 135–145)

## 2020-04-12 LAB — CBC
HCT: 23.2 % — ABNORMAL LOW (ref 39.0–52.0)
Hemoglobin: 7.7 g/dL — ABNORMAL LOW (ref 13.0–17.0)
MCH: 30.9 pg (ref 26.0–34.0)
MCHC: 33.2 g/dL (ref 30.0–36.0)
MCV: 93.2 fL (ref 80.0–100.0)
Platelets: 303 10*3/uL (ref 150–400)
RBC: 2.49 MIL/uL — ABNORMAL LOW (ref 4.22–5.81)
RDW: 17.2 % — ABNORMAL HIGH (ref 11.5–15.5)
WBC: 17.4 10*3/uL — ABNORMAL HIGH (ref 4.0–10.5)
nRBC: 1.3 % — ABNORMAL HIGH (ref 0.0–0.2)

## 2020-04-12 LAB — URINE CULTURE: Culture: >=100000 — AB

## 2020-04-12 LAB — MAGNESIUM: Magnesium: 2 mg/dL (ref 1.7–2.4)

## 2020-04-12 MED ORDER — SODIUM CHLORIDE 0.9 % IV SOLN
1.0000 g | INTRAVENOUS | Status: AC
  Administered 2020-04-12 – 2020-04-16 (×5): 1 g via INTRAVENOUS
  Filled 2020-04-12 (×5): qty 10

## 2020-04-12 MED ORDER — CEFDINIR 300 MG PO CAPS: 300.0000 mg | ORAL_CAPSULE | Freq: Two times a day (BID) | ORAL | Status: DC

## 2020-04-12 NOTE — Progress Notes (Signed)
Nephrology Follow-Up Consult note   Assessment/Recommendations: Brian Wolf is a/an 87 y.o. male with a past medical history significant for HTN, CVA, CKD, GI bleeding, OSA who present w/ weakness and GI bleed now w/ AKI on CKD  Non-Oliguric AKI on CKD, unclear stage 3b/IV: Likely baseline arterionephrosclerosis but unclear baseline. Most likely around 1.5-2 but could be higher.  Creatinine on arrival was 2.6.  Renal ultrasound without significant concern but bilateral cysts.  Mostly felt to be dehydration with possible tubular injury -Allow for oral hydration -Treatment of UTI as below -Continue to monitor daily Cr, Dose meds for GFR -Monitor Daily I/Os, Daily weight  -Maintain MAP>65 for optimal renal perfusion.  -Avoid nephrotoxic medications including NSAIDs and Vanc/Zosyn combo -Currently no indication for HD  Urinary tract infection: Urine culture with Proteus.  Was on Augmentin due to concerns for aspiration pneumonia.  Culture came back resistant to Augmentin.  Now on ceftriaxone  Hypertension: Continue current medications as prescribed.  Blood pressure acceptable  Aspiration pneumonitis/pneumonia: Antibiotics per primary team  Acute hypoxic respiratory failure: Likely multifactorial with pleural effusion and possible aspiration pneumonitis with reactive airway disease contributing.    Has responded to nebulizers as well as thoracentesis and antibiotics  Metabolic acidosis: Likely associated with AKI.    Has improved with supplementation.  We will stop oral bicarbonate.  Could consider restarting if serum bicarbonate is less than 20  Anemia/GI bleed: Hemoglobin 7.7.  Has received iron.  Not administering ESA at this time  Given the patient's improving kidney function we will sign off at this time.  I will arrange follow-up in clinic in 3 to 4 weeks.  If further assistance is needed please do not hesitate to contact our team.  Thank you  Recommendations conveyed to  primary service.    [facility] 04/12/2020 10:41 AM  ___________________________________________________________  CC: AKI on CKD  Interval History/Subjective: Patient feeling okay today.  Continues to be relatively tired.  Urine output has been adequate and creatinine slowly improving.  Hoping to leave the hospital soon   Medications:  Current Facility-Administered Medications  Medication Dose Route Frequency Provider Last Rate Last Admin  . cefTRIAXone (ROCEPHIN) 1 g in sodium chloride 0.9 % 100 mL IVPB  1 g Intravenous Q24H Lavina Hamman, MD      . feeding supplement (NEPRO CARB STEADY) liquid 237 mL  237 mL Oral BID BM Lavina Hamman, MD   237 mL at 04/12/20 1024  . ferric gluconate (NULECIT) 250 mg in sodium chloride 0.9 % 100 mL IVPB  250 mg Intravenous Daily Reesa Chew, MD 120 mL/hr at 04/11/20 1601 250 mg at 04/11/20 1601  . hydrALAZINE (APRESOLINE) tablet 50 mg  50 mg Oral TID Ronald Lobo, MD   50 mg at 04/12/20 1025  . ipratropium-albuterol (DUONEB) 0.5-2.5 (3) MG/3ML nebulizer solution 3 mL  3 mL Nebulization Q6H PRN Lovey Newcomer T, NP   3 mL at 04/07/20 0617  . isosorbide dinitrate (ISORDIL) tablet 30 mg  30 mg Oral TID Ronald Lobo, MD   30 mg at 04/12/20 1025  . lactulose (CHRONULAC) 10 GM/15ML solution 10 g  10 g Oral Daily Lavina Hamman, MD      . latanoprost (XALATAN) 0.005 % ophthalmic solution 1 drop  1 drop Both Eyes QHS Johnson, Clanford L, MD   1 drop at 04/11/20 2216  . loratadine (CLARITIN) tablet 10 mg  10 mg Oral Daily PRN Murlean Iba, MD      .  MEDLINE mouth rinse  15 mL Mouth Rinse BID Johnson, Clanford L, MD   15 mL at 04/12/20 1025  . metoprolol succinate (TOPROL-XL) 24 hr tablet 25 mg  25 mg Oral Daily Adrian Prows, MD   25 mg at 04/12/20 1025  . pantoprazole (PROTONIX) EC tablet 40 mg  40 mg Oral BID AC Buccini, [name], MD   40 mg at 04/12/20 1025  . saccharomyces boulardii (FLORASTOR) capsule 250 mg   250 mg Oral BID Johnson, Clanford L, MD   250 mg at 04/12/20 1024  . simvastatin (ZOCOR) tablet 40 mg  40 mg Oral Daily Ronald Lobo, MD   40 mg at 04/11/20 2214  . sodium bicarbonate tablet 650 mg  650 mg Oral BID Reesa Chew, MD   650 mg at 04/12/20 1025  . sodium chloride flush (NS) 0.9 % injection 3 mL  3 mL Intravenous Q12H Buccini, Robert, MD   3 mL at 04/12/20 1026  . sucralfate (CARAFATE) 1 GM/10ML suspension 1 g  1 g Oral TID WC & HS Buccini, Robert, MD   1 g at 04/12/20 1024  . tamsulosin (FLOMAX) capsule 0.4 mg  0.4 mg Oral Daily Johnson, Clanford L, MD   0.4 mg at 04/11/20 2214      Review of Systems: 10 systems reviewed and negative except per interval history/subjective  Physical Exam: Vitals:   04/12/20 0300 04/12/20 1032  BP: 138/65 (!) 130/55  Pulse: 70   Resp: 17   Temp: 98.1 F (36.7 C)   SpO2: 97%    No intake/output data recorded.  Intake/Output Summary (Last 24 hours) at 04/12/2020 1041 Last data filed at 04/11/2020 1843 Gross per 24 hour  Intake 220 ml  Output -  Net 220 ml    General: Chronically ill-appearing, lying in bed, no distress HEENT: anicteric sclera,  moist mucous membranes CV: Normal rate, no audible murmurs Lungs:  No increased work of breathing, bilateral chest rise Abd: soft, non-tender, non-distended Skin: no visible lesions or rashes Neuro, awake, alert, answers questions appropriately   Test Results I personally reviewed new and old clinical labs and radiology tests Lab Results  Component Value Date   NA 137 04/12/2020   K 3.9 04/12/2020   CL 109 04/12/2020   CO2 21 (L) 04/12/2020   BUN 69 (H) 04/12/2020   CREATININE 3.15 (H) 04/12/2020   CALCIUM 7.5 (L) 04/12/2020   ALBUMIN 2.1 (L) 04/12/2020   PHOS 3.2 04/12/2020

## 2020-04-12 NOTE — Consult Note (Signed)
Consultation Note Date: 04/12/2020   Patient Name: Brian Wolf  DOB: 05-23-1931  MRN: [id number]  Age / Sex: 87 y.o., male  PCP: [name], DO Referring Physician: Lavina Hamman, MD  Reason for Consultation: Establishing goals of care  HPI/Patient Profile: 87 y.o. male with past medical history of dementia, CVA,stage IV CKD, bradycardia, diastolic heart failure, gastric ulcer, and HTN  who was admitted on 04/05/2020 with dark stools.  The patient's hgb dropped to 6.7 and he required transfusion.   He underwent EGD with biopsy and was found to have a cratered 10 mm ulceration in the second portion of his duodenum.   His creatinine was elevated over baseline on admission and has continued to trend up (3.15).  Nephrology was consulted.  CXR showed vascular congestion and his LVEF was found to be 35 - 40% a significant change from prior.  Cardiology consulted.  PMT has been asked to assist in the establishment of goals of care.  Patient is a DNR.  Clinical Assessment and Goals of Care:  I have reviewed medical records including EPIC notes, labs and imaging, received report from the care team, examined the patient and met at bedside with his wife  to discuss diagnosis prognosis, GOC, EOL wishes, disposition and options.  I introduced Palliative Medicine as specialized medical care for people living with serious illness. It focuses on providing relief from the symptoms and stress of a serious illness.   We discussed a brief life review of the patient. Patient has been at home with wife.  They have been married over 30+ years.  He has two children (son and dtr) from a previous marriage.  I gather from the wife's description that relationships between the patient's children and the wife are strained.  The wife appears extremely attentive to patient's needs.  She states her goal is to get him home and continue to  provide the best care possible for him.   Son is a physician but we have been asked to only speak with the wife which we will respect.  As far as functional and nutritional status patient's albumin is 2.1.  He is able to stand and pivot with assistance.  He is hard of hearing but will answer questions when he hears them.  We discussed his current illness and what it means in the larger context of his on-going co-morbidities.  Natural disease trajectory and expectations at EOL were discussed.  Wife is quite shocked.  He was fine until he had this GIB and now we are finding everything is wrong with him!  She reports that he follows up with all of his doctors and did not have a problem with his heart or kidney's previously.  I inquired about Mr. Fantroy code status of DNR.  His wife explains that the patient's son did that behind her back.   I am unable at this time to speak to [name] about his wishes for intervention at EOL.    PMT will follow back up tomorrow.  Questions and concerns were addressed.  The family was encouraged to call with questions or concerns.   Primary Decision Maker:  NEXT OF KIN wife, but I would like the opportunity to ask Mr. [name] that question directly.    SUMMARY OF RECOMMENDATIONS    Continue current care.  Patient very frail and will need quite a bit of assistance.  Wife had a hip replacement in December and likely will be unable to handle him.  He will needs to be able to do most of his ADLs on his own in order for her to handle him at home.     If his creatinine continues to rise he will likely be hospice eligible.  I will encourage his wife to permit a Palliative family meeting.  Given his very low albumin I will request a calorie count.  Is he eating enough to sustain himself?  Code Status/Advance Care Planning:  DNR   Symptom Management:   Per primary. No current complaints.  Additional Recommendations (Limitations, Scope, Preferences):  Full Scope  Treatment  Palliative Prophylaxis:   Delirium Protocol and Turn Reposition  Psycho-social/Spiritual:   Desire for further Chaplaincy support: not discussed.  Prognosis: If creatinine continues to trend up patient will likely have days to weeks.  If he can plateau and reverse the trend his prognosis is some number of months.  Either way I believe he is a good candidate for Hospice support services if the family is of the Hospice philosophy.    Discharge Planning: To Be Determined      Primary Diagnoses: Present on Admission: . (Resolved) GI bleed . Acute kidney injury superimposed on chronic kidney disease ([place]) . Acute upper GI bleed . Essential hypertension . Sleep apnea . Bilateral lower extremity edema . Acute blood loss anemia . Acute diastolic CHF (congestive heart failure) ([place])   I have reviewed the medical record, interviewed the patient and family, and examined the patient. The following aspects are pertinent.  Past Medical History:  Diagnosis Date  . CVA (cerebral infarction) 12/2013  . Dementia ([place])   . Gastric ulcer    prior to 2000  . Hypertension   . Sleep difficulties    Social History   Socioeconomic History  . Marital status: Married    Spouse name: [name]  . Number of children: 2  . Years of education: college  . Highest education level: Not on file  Occupational History    Comment: retired  Tobacco Use  . Smoking status: Never Smoker  . Smokeless tobacco: Never Used  Vaping Use  . Vaping Use: Never used  Substance and Sexual Activity  . Alcohol use: No    Alcohol/week: 0.0 standard drinks  . Drug use: No  . Sexual activity: Not on file  Other Topics Concern  . Not on file  Social History Narrative   Patient lives at home with his wife [name].   Retired.   Education Retail buyer.   Right handed.   Caffeine two cups of coffee daily.   Social Determinants of Health   Financial Resource Strain: Not on file  Food Insecurity:  Not on file  Transportation Needs: Not on file  Physical Activity: Not on file  Stress: Not on file  Social Connections: Not on file   Family History  Problem Relation Age of Onset  . Hyperlipidemia Mother   . Hypertension Mother   . Hypertension Father     No Known Allergies   Vital Signs: BP 140/60 (BP  Location: Right Arm)   Pulse 65   Temp 98.4 F (36.9 C) (Oral)   Resp 18   Ht _0  (1.651 m)   Wt 69.4 kg   SpO2 99%   BMI 25.46 kg/m  Pain Scale: 0-10 POSS *See Group Information*: 1-Acceptable,Awake and alert Pain Score: Asleep   SpO2: SpO2: 99 % O2 Device:SpO2: 99 % O2 Flow Rate: .O2 Flow Rate (L/min): 1 L/min    Palliative Assessment/Data: 40%     Time In: 3:00 Time Out: 4:05 Time Total: 65 min. Visit consisted of counseling and education dealing with the complex and emotionally intense issues surrounding the need for palliative care and symptom management in the setting of serious and potentially life-threatening illness. Greater than 50%  of this time was spent counseling and coordinating care related to the above assessment and plan.  Signed by: Florentina Jenny, PA-C Palliative Medicine  Please contact Palliative Medicine Team phone at [phone] for questions and concerns.  For individual provider: See Shea Evans

## 2020-04-12 NOTE — Progress Notes (Signed)
[facility] Progress Note  Patient: Brian Wolf    GYI:[id number]  DOA: 04/05/2020     Date of Service: the patient was seen and examined on 04/12/2020  Brief hospital course: Past medical history of HTN, CVA, CKD, OSA on CPAP, sinus node dysfunction without a pacemaker.  Presents with severe anemia found to have duodenal ulcer on EGD.  Now appears to be having volume overload requiring IV diuresis.  This resulted in worsening renal function and now appears to have metabolic encephalopathy from uremia. Currently plan is monitor improvement in renal function and monitor for volume overload as receiving IV fluids  Assessment and Plan: 1.  Acute upper GI bleed secondary to duodenal ulcer Acute blood loss anemia Chronic active gastritis EGD 3/6 shows nonbleeding duodenal ulcer.  On PPI 40 mg daily indefinitely. Baseline hemoglobin around 9-10. On admission hemoglobin 6.7. SP 2 PRBC transfusion.  Currently H&H relatively stable. Hemoglobin erroneously low on 3/9 morning at 7.7. Repeat hemoglobin 8.6 which is where he has been for last 48 hours. Reticulocyte count elevated suggesting acute anemia. Iron level low. INR normal. For now continue PPI p.o. and monitor H&H daily. Transfuse for hemoglobin less than 7. Verified with the GI and pathology.  H. pylori actually is negative.  No further treatment.  2.  AKI on CKD stage IV Metabolic acidosis Baseline serum creatinine around 2. Now appears to be progressively worsening. For last 2 days serum creatinine has been improving.  BUN remains high. Mild worsening of mental status from baseline likely associated with his hospital stay as well as worsening uremia. Nephrology consulted. Renal ultrasound shows no obstruction. Hold diuretics.  In future if he had to use diuretics use with albumin. Urine sodium less than 10. Avoid nephrotoxic medication.  3.  Acute on chronic combined diastolic and systolic CHF Right pleural  effusion Third spacing with hypoalbuminemia Echocardiogram on 3/6 EF 35 to 40%. Grade 1 diastolic dysfunction. Received IV diuresis with worsening renal function therefore currently diuresis on hold. Chest x-ray showed evidence of right-sided pleural effusion. Underwent thoracentesis with removal of 1.2 L of fluid. Fluid analysis consistent with transudative fluid. Repeat x-ray shows improvement in volume status. CXR on 3/11 shows stable pleural effusion. Monitor for reaccumulation.  4.  Aspiration pneumonia. Proteus UTI. There was initial concern for consolidation on the right side with aspiration. Currently significant improvement in chest x-ray after removal of the fluid. Due to ongoing cough with greenish sputum will initiate antibiotic. Also urine growing Proteus.  Will continue ceftriaxone.  5.  Atelectasis. OSA Recommended patient to remain compliant with CPAP here in the hospital.  Patient currently agreeable. Incentive spirometry highly recommended and educated with the patient.  6.  Acute metabolic encephalopathy Mild worsening per family likely in the setting of procedures, anesthesia and uremia. Monitor.  Avoid psychotropic medication. Currently does not have any insight in his medical condition. Prognosis remains very poor due to the patient's cooperation with his medical treatment. Palliative care consulted.  7.  HLD. Continue statin.  8.  BPH. Continue Flomax.  9.  Constipation. Lactulose for bowel regimen.  10.  Essential hypertension. Blood pressure stable. Continue current regimen although avoiding hypotensive events. Holding Aldactone and losartan in the setting of AKI.  11.  History of CVA. On aspirin prior to admission.  Currently on hold secondary to duodenal ulcer. We will discuss with GI regarding timing of resumption.  12.  Glaucoma. Continue home eyedrops.  13.  Goals of care conversation. Outpatient palliative care  assistance. Prognosis  remains poor due to multiple comorbidities.  Diet: Cardiac diet DVT Prophylaxis:   SCDs Start: 04/05/20 1053    Advance goals of care discussion: DNR discussed in detail regarding patient's poor prognosis with multiple comorbidities. Palliative care consult was recommended which wife agreed.  Family Communication: Wife was present at bedside, at the time of interview.  Recommend her to involve rest the family member at decision-making process. The pt provided permission to discuss medical plan with the family. Opportunity was given to ask question and all questions were answered satisfactorily.  Wife would not like any other family members to receive patient's information.  We will refer other family members to wife for patient's information.  Disposition:  Status is: Inpatient  Remains inpatient appropriate because:IV treatments appropriate due to intensity of illness or inability to take PO   Dispo: The patient is from: Home              Anticipated d/c is to: SNF              Patient currently is not medically stable to d/c.   Difficult to place patient No  Subjective: Per wife patient appears better than yesterday.  No nausea no vomiting but no fever no chills.  Oral intake improving.  Physical Exam:  General: Appear in mild distress, no Rash; Oral Mucosa Clear, moist. no Abnormal Neck Mass Or lumps, Conjunctiva normal  Cardiovascular: S1 and S2 Present, no Murmur, Respiratory: good respiratory effort, Bilateral Air entry present and bilateral crackles, no wheezes Abdomen: Bowel Sound present, Soft and no tenderness Extremities: Trace pedal edema Neurology: alert and oriented to time, place, and person affect appropriate. no new focal deficit Gait not checked due to patient safety concerns   Vitals:   04/11/20 2213 04/12/20 0300 04/12/20 1032 04/12/20 1455  BP: 134/62 138/65 (!) 130/55 140/60  Pulse:  70  65  Resp:  17  18  Temp:  98.1 F (36.7 C)  98.4 F (36.9 C)   TempSrc:  Oral  Oral  SpO2:  97%  99%  Weight:      Height:        Intake/Output Summary (Last 24 hours) at 04/12/2020 2012 Last data filed at 04/12/2020 1200 Gross per 24 hour  Intake 240 ml  Output --  Net 240 ml   Filed Weights   04/05/20 0641  Weight: 69.4 kg    Data Reviewed: I have personally reviewed and interpreted daily labs, tele strips, imaging. I reviewed all nursing notes, pharmacy notes, vitals, pertinent old records I have discussed plan of care as described above with RN and patient/family.  CBC: Recent Labs  Lab 04/07/20 0252 04/08/20 0144 04/09/20 0251 04/09/20 1208 04/12/20 0330  WBC 11.0* 9.4 12.1* 15.1* 17.4*  HGB 8.4* 8.9* 7.7* 8.6* 7.7*  HCT 26.2* 27.3* 24.1* 26.2* 23.2*  MCV 93.6 93.2 93.8 93.2 93.2  PLT 300 336 302 359 973   Basic Metabolic Panel: Recent Labs  Lab 04/08/20 0144 04/09/20 0251 04/10/20 0348 04/11/20 0233 04/12/20 0330  NA 136 134* 136 137 137  K 4.4 4.3 3.9 3.3* 3.9  CL 109 104 105 107 109  CO2 17* 20* 22 21* 21*  GLUCOSE 165* 122* 105* 103* 98  BUN 66* 72* 81* 74* 69*  CREATININE 3.21* 3.67* 3.75* 3.42* 3.15*  CALCIUM 8.1* 7.4* 7.8* 7.7* 7.5*  MG  --  2.2  --   --  2.0  PHOS 4.8* 5.3* 4.4 3.4 3.2  Studies: No results found.  Scheduled Meds: . feeding supplement (NEPRO CARB STEADY)  237 mL Oral BID BM  . hydrALAZINE  50 mg Oral TID  . isosorbide dinitrate  30 mg Oral TID  . lactulose  10 g Oral Daily  . latanoprost  1 drop Both Eyes QHS  . mouth rinse  15 mL Mouth Rinse BID  . metoprolol succinate  25 mg Oral Daily  . pantoprazole  40 mg Oral BID AC  . saccharomyces boulardii  250 mg Oral BID  . simvastatin  40 mg Oral Daily  . sodium chloride flush  3 mL Intravenous Q12H  . sucralfate  1 g Oral TID WC & HS  . tamsulosin  0.4 mg Oral Daily   Continuous Infusions: . cefTRIAXone (ROCEPHIN)  IV 1 g (04/12/20 1056)   PRN Meds: ipratropium-albuterol, loratadine  Time spent: 35  minutes  Author: Berle Mull, MD [facility] 04/12/2020 8:12 PM  To reach On-call, see care teams to locate the attending and reach out via [email or web address]. Between 7PM-7AM, please contact night-coverage If you still have difficulty reaching the attending provider, please page the [facility] (Director on Call) for [facility] on amion for assistance.

## 2020-04-12 NOTE — Progress Notes (Signed)
Pt unable to wear CPAP at this time due to confusion and not alert enough to pull mask off/high risk for aspiration.

## 2020-04-12 NOTE — Plan of Care (Signed)
  Problem: Safety: Goal: Ability to remain free from injury will improve Outcome: Progressing   

## 2020-04-13 DIAGNOSIS — K922 Gastrointestinal hemorrhage, unspecified: Secondary | ICD-10-CM | POA: Diagnosis not present

## 2020-04-13 LAB — CBC
HCT: 25.7 % — ABNORMAL LOW (ref 39.0–52.0)
Hemoglobin: 8.1 g/dL — ABNORMAL LOW (ref 13.0–17.0)
MCH: 30.2 pg (ref 26.0–34.0)
MCHC: 31.5 g/dL (ref 30.0–36.0)
MCV: 95.9 fL (ref 80.0–100.0)
Platelets: 315 10*3/uL (ref 150–400)
RBC: 2.68 MIL/uL — ABNORMAL LOW (ref 4.22–5.81)
RDW: 18 % — ABNORMAL HIGH (ref 11.5–15.5)
WBC: 15.9 10*3/uL — ABNORMAL HIGH (ref 4.0–10.5)
nRBC: 0.8 % — ABNORMAL HIGH (ref 0.0–0.2)

## 2020-04-13 LAB — CULTURE, BODY FLUID W GRAM STAIN -BOTTLE: Culture: NO GROWTH

## 2020-04-13 LAB — RENAL FUNCTION PANEL
Albumin: 2.2 g/dL — ABNORMAL LOW (ref 3.5–5.0)
Anion gap: 7 (ref 5–15)
BUN: 60 mg/dL — ABNORMAL HIGH (ref 8–23)
CO2: 22 mmol/L (ref 22–32)
Calcium: 7.7 mg/dL — ABNORMAL LOW (ref 8.9–10.3)
Chloride: 107 mmol/L (ref 98–111)
Creatinine, Ser: 2.83 mg/dL — ABNORMAL HIGH (ref 0.61–1.24)
GFR, Estimated: 21 mL/min — ABNORMAL LOW (ref >60)
Glucose, Bld: 97 mg/dL (ref 70–99)
Phosphorus: 3.3 mg/dL (ref 2.5–4.6)
Potassium: 3.8 mmol/L (ref 3.5–5.1)
Sodium: 136 mmol/L (ref 135–145)

## 2020-04-13 LAB — MAGNESIUM: Magnesium: 2.1 mg/dL (ref 1.7–2.4)

## 2020-04-13 NOTE — Progress Notes (Signed)
[facility] Progress Note  Patient: Brian Wolf    ZOX:[id number]  DOA: 04/05/2020     Date of Service: the patient was seen and examined on 04/13/2020  Brief hospital course: Past medical history of HTN, CVA, CKD, OSA on CPAP, sinus node dysfunction without a pacemaker.  Presents with severe anemia found to have duodenal ulcer on EGD.  Now appears to be having volume overload requiring IV diuresis.  This resulted in worsening renal function and now appears to have metabolic encephalopathy from uremia. Currently plan is monitor for improvement in renal function and mentation.  Assessment and Plan: 1.  Acute upper GI bleed secondary to duodenal ulcer Acute blood loss anemia Chronic active gastritis EGD 3/6 shows nonbleeding duodenal ulcer.  On PPI 40 mg daily indefinitely. Baseline hemoglobin around 9-10. On admission hemoglobin 6.7. SP 2 PRBC transfusion.  Currently H&H relatively stable. Hemoglobin erroneously low on 3/9 morning at 7.7. Repeat hemoglobin 8.6 which is where he has been for last 48 hours. Reticulocyte count elevated suggesting acute anemia. Iron level low. INR normal. For now continue PPI p.o. and monitor H&H daily. Transfuse for hemoglobin less than 7. Verified with the GI and pathology.  H. pylori actually is negative.  No further treatment.  2.  AKI on CKD stage IV Metabolic acidosis Baseline serum creatinine around 2. Now appears to be progressively worsening. For last few days serum creatinine has been improving.  BUN also started trending down. Mild worsening of mental status from baseline likely associated with his hospital stay as well as worsening uremia. Nephrology consulted. Renal ultrasound shows no obstruction. Hold diuretics.  In future if he had to use diuretics use with albumin. Urine sodium less than 10. Avoid nephrotoxic medication.  3.  Acute on chronic combined diastolic and systolic CHF Right pleural effusion Third spacing with  hypoalbuminemia Echocardiogram on 3/6 EF 35 to 40%. Grade 1 diastolic dysfunction. Received IV diuresis with worsening renal function therefore currently diuresis on hold. Chest x-ray showed evidence of right-sided pleural effusion. Underwent thoracentesis with removal of 1.2 L of fluid. Fluid analysis consistent with transudative fluid. Repeat x-ray shows improvement in volume status. CXR on 3/11 shows stable pleural effusion. Monitor for reaccumulation.  4.  Aspiration pneumonia. Proteus UTI. There was initial concern for consolidation on the right side with aspiration. Currently significant improvement in chest x-ray after removal of the fluid. Due to ongoing cough with greenish sputum will initiate antibiotic. Also urine growing Proteus.  Will continue ceftriaxone.  5.  Atelectasis. OSA Recommended patient to remain compliant with CPAP here in the hospital.  Patient currently agreeable. Incentive spirometry highly recommended and educated with the patient.  6.  Acute metabolic encephalopathy Mild worsening per family likely in the setting of procedures, anesthesia and uremia. Monitor.  Avoid psychotropic medication. Currently does not have any insight in his medical condition. Prognosis remains very poor due to the patient's cooperation with his medical treatment. Palliative care consulted.  7.  HLD. Continue statin.  8.  BPH. Continue Flomax.  9.  Constipation. Lactulose for bowel regimen.  10.  Essential hypertension. Blood pressure stable. Continue current regimen although avoiding hypotensive events. Holding Aldactone and losartan in the setting of AKI.  11.  History of CVA. On aspirin prior to admission.  Currently on hold secondary to duodenal ulcer. We will discuss with GI regarding timing of resumption.  12.  Glaucoma. Continue home eyedrops.  13.  Goals of care conversation. Outpatient palliative care assistance. Prognosis remains poor  due to multiple  comorbidities.  Diet: Cardiac diet DVT Prophylaxis:   SCDs Start: 04/05/20 1053    Advance goals of care discussion: DNR discussed in detail regarding patient's poor prognosis with multiple comorbidities. Palliative care consult was recommended which wife agreed.  Family Communication: Wife was present at bedside, at the time of interview.  Recommend her to involve rest the family member at decision-making process. The pt provided permission to discuss medical plan with the family. Opportunity was given to ask question and all questions were answered satisfactorily.  Wife would not like any other family members to receive patient's information.  We will refer other family members to wife for patient's information.  Disposition:  Status is: Inpatient  Remains inpatient appropriate because:IV treatments appropriate due to intensity of illness or inability to take PO   Dispo: The patient is from: Home              Anticipated d/c is to: SNF              Patient currently is not medically stable to d/c.   Difficult to place patient No  Subjective: No nausea no vomiting.  No fever no chills.  Per wife patient appears to be more mentally stable.  I do not see any change in his mentation from yesterday.  Continues to have struggles with incentive spirometry.  Physical Exam:  General: Appear in mild distress, no Rash; Oral Mucosa Clear, moist. no Abnormal Neck Mass Or lumps, Conjunctiva normal  Cardiovascular: S1 and S2 Present, no Murmur, Respiratory: Increased respiratory effort, Bilateral Air entry present and faint crackles, no wheezes Abdomen: Bowel Sound present, Soft and no tenderness Extremities: no Pedal edema Neurology: alert and oriented to time, place, and person affect appropriate. no new focal deficit Gait not checked due to patient safety concerns    Vitals:   04/13/20 0300 04/13/20 0829 04/13/20 1700 04/13/20 1931  BP: 134/61 (!) 149/67 (!) 117/49 (!) 122/58  Pulse:  73 63 72 74  Resp: 18 17 17 18   Temp: 98.2 F (36.8 C) 98.1 F (36.7 C) 99.3 F (37.4 C) 98.7 F (37.1 C)  TempSrc: Oral   Oral  SpO2: 98% 99% 98% 98%  Weight:      Height:        Intake/Output Summary (Last 24 hours) at 04/13/2020 2057 Last data filed at 04/13/2020 1400 Gross per 24 hour  Intake 720 ml  Output --  Net 720 ml   Filed Weights   04/05/20 0641  Weight: 69.4 kg    Data Reviewed: I have personally reviewed and interpreted daily labs, tele strips, imaging. I reviewed all nursing notes, pharmacy notes, vitals, pertinent old records I have discussed plan of care as described above with RN and patient/family.  CBC: Recent Labs  Lab 04/08/20 0144 04/09/20 0251 04/09/20 1208 04/12/20 0330 04/13/20 0738  WBC 9.4 12.1* 15.1* 17.4* 15.9*  HGB 8.9* 7.7* 8.6* 7.7* 8.1*  HCT 27.3* 24.1* 26.2* 23.2* 25.7*  MCV 93.2 93.8 93.2 93.2 95.9  PLT 336 302 359 303 353   Basic Metabolic Panel: Recent Labs  Lab 04/09/20 0251 04/10/20 0348 04/11/20 0233 04/12/20 0330 04/13/20 0738  NA 134* 136 137 137 136  K 4.3 3.9 3.3* 3.9 3.8  CL 104 105 107 109 107  CO2 20* 22 21* 21* 22  GLUCOSE 122* 105* 103* 98 97  BUN 72* 81* 74* 69* 60*  CREATININE 3.67* 3.75* 3.42* 3.15* 2.83*  CALCIUM 7.4* 7.8* 7.7*  7.5* 7.7*  MG 2.2  --   --  2.0 2.1  PHOS 5.3* 4.4 3.4 3.2 3.3    Studies: No results found.  Scheduled Meds: . feeding supplement (NEPRO CARB STEADY)  237 mL Oral BID BM  . hydrALAZINE  50 mg Oral TID  . isosorbide dinitrate  30 mg Oral TID  . lactulose  10 g Oral Daily  . latanoprost  1 drop Both Eyes QHS  . mouth rinse  15 mL Mouth Rinse BID  . metoprolol succinate  25 mg Oral Daily  . pantoprazole  40 mg Oral BID AC  . saccharomyces boulardii  250 mg Oral BID  . simvastatin  40 mg Oral Daily  . sodium chloride flush  3 mL Intravenous Q12H  . sucralfate  1 g Oral TID WC & HS  . tamsulosin  0.4 mg Oral Daily   Continuous Infusions: . cefTRIAXone (ROCEPHIN)   IV 1 g (04/13/20 1121)   PRN Meds: ipratropium-albuterol, loratadine  Time spent: 35 minutes  Author: Berle Mull, MD [facility] 04/13/2020 8:57 PM  To reach On-call, see care teams to locate the attending and reach out via [email or web address]. Between 7PM-7AM, please contact night-coverage If you still have difficulty reaching the attending provider, please page the [facility] (Director on Call) for [facility] on amion for assistance.

## 2020-04-14 ENCOUNTER — Inpatient Hospital Stay (HOSPITAL_COMMUNITY): Payer: Medicare Other

## 2020-04-14 DIAGNOSIS — R609 Edema, unspecified: Secondary | ICD-10-CM

## 2020-04-14 DIAGNOSIS — Z515 Encounter for palliative care: Secondary | ICD-10-CM

## 2020-04-14 DIAGNOSIS — I5031 Acute diastolic (congestive) heart failure: Secondary | ICD-10-CM | POA: Diagnosis not present

## 2020-04-14 DIAGNOSIS — K922 Gastrointestinal hemorrhage, unspecified: Secondary | ICD-10-CM | POA: Diagnosis not present

## 2020-04-14 DIAGNOSIS — N179 Acute kidney failure, unspecified: Secondary | ICD-10-CM | POA: Diagnosis not present

## 2020-04-14 DIAGNOSIS — E440 Moderate protein-calorie malnutrition: Secondary | ICD-10-CM | POA: Insufficient documentation

## 2020-04-14 LAB — RENAL FUNCTION PANEL
Albumin: 2 g/dL — ABNORMAL LOW (ref 3.5–5.0)
Anion gap: 9 (ref 5–15)
BUN: 57 mg/dL — ABNORMAL HIGH (ref 8–23)
CO2: 21 mmol/L — ABNORMAL LOW (ref 22–32)
Calcium: 7.6 mg/dL — ABNORMAL LOW (ref 8.9–10.3)
Chloride: 106 mmol/L (ref 98–111)
Creatinine, Ser: 2.78 mg/dL — ABNORMAL HIGH (ref 0.61–1.24)
GFR, Estimated: 21 mL/min — ABNORMAL LOW (ref >60)
Glucose, Bld: 103 mg/dL — ABNORMAL HIGH (ref 70–99)
Phosphorus: 3.2 mg/dL (ref 2.5–4.6)
Potassium: 3.6 mmol/L (ref 3.5–5.1)
Sodium: 136 mmol/L (ref 135–145)

## 2020-04-14 LAB — CBC
HCT: 25.2 % — ABNORMAL LOW (ref 39.0–52.0)
Hemoglobin: 7.9 g/dL — ABNORMAL LOW (ref 13.0–17.0)
MCH: 30.3 pg (ref 26.0–34.0)
MCHC: 31.3 g/dL (ref 30.0–36.0)
MCV: 96.6 fL (ref 80.0–100.0)
Platelets: 319 10*3/uL (ref 150–400)
RBC: 2.61 MIL/uL — ABNORMAL LOW (ref 4.22–5.81)
RDW: 18.1 % — ABNORMAL HIGH (ref 11.5–15.5)
WBC: 15.9 10*3/uL — ABNORMAL HIGH (ref 4.0–10.5)
nRBC: 0.4 % — ABNORMAL HIGH (ref 0.0–0.2)

## 2020-04-14 LAB — MAGNESIUM: Magnesium: 2.1 mg/dL (ref 1.7–2.4)

## 2020-04-14 MED ORDER — ADULT MULTIVITAMIN W/MINERALS CH
1.0000 | ORAL_TABLET | Freq: Every day | ORAL | Status: DC
  Administered 2020-04-14 – 2020-04-18 (×5): 1 via ORAL
  Filled 2020-04-14 (×5): qty 1

## 2020-04-14 MED ORDER — ENSURE ENLIVE PO LIQD
237.0000 mL | Freq: Two times a day (BID) | ORAL | Status: DC
  Administered 2020-04-15 – 2020-04-16 (×2): 237 mL via ORAL

## 2020-04-14 NOTE — Progress Notes (Signed)
Physical Therapy Treatment Patient Details Name: Brian Wolf MRN: [id number] DOB: 07/27/1931 Today's Date: 04/14/2020    History of Present Illness Pt is an 87 y/o male admitted 3/5 secondary to dark stools; thought to be secondary to GI bleed. Pt is s/p EGD on 3/6 that showed large duodenal ulcer. Since admission, pt has developed wheezing as well. Thought to be secondary to CHF and possible aspiration PNA. Pt is s/p thoracentesis on 3/8. PMH includes HTN, CVA, CKD, and OSA on CPAP.    PT Comments    Pt supine in bed on arrival sleeping.  Wife woke patient to participate and he was not thrilled about this.  He was agreeable to PT session and participated intermittently.  He required increased assistance this session. Will give the benefit of the doubt on his presentation but if he continues to require +2 assistance he will need a higher level of care at d/c.      Follow Up Recommendations  Home health PT;Supervision/Assistance - 24 hour     Equipment Recommendations  Other (comment) (rollator 4 wheeled walker with seat)    Recommendations for Other Services       Precautions / Restrictions Precautions Precautions: Fall Restrictions Weight Bearing Restrictions: No    Mobility  Bed Mobility Overal bed mobility: Needs Assistance Bed Mobility: Rolling     Supine to sit: Max assist;HOB elevated     General bed mobility comments: Max assistance to move LEs to edge of bed and elevate trunk into a seated position this session.  Pt reports pain in B feet when moving to edge of bed.    Transfers Overall transfer level: Needs assistance Equipment used: Rolling walker (2 wheeled) Transfers: Sit to/from Stand Sit to Stand: Mod assist;+2 physical assistance         General transfer comment: Mod +2 to rise into standing against gravity this session.  Pt with minimal efforts on his behalf and bed raised for improved ease of transfer.  Ambulation/Gait Ambulation/Gait  assistance: Mod assist;+2 physical assistance Gait Distance (Feet): 8 Feet Assistive device: Rolling walker (2 wheeled) Gait Pattern/deviations: Step-through pattern;Decreased stride length;Shuffle;Trunk flexed Gait velocity: Decreased   General Gait Details: Pt pushing RW too far forward with poor foot clearance and wide BOS.  Eyes closed intermittently this session. Increased time to weight shift and move feet forward.  Poor balance noted this session.   Stairs             Wheelchair Mobility    Modified Rankin (Stroke Patients Only)       Balance Overall balance assessment: Needs assistance Sitting-balance support: No upper extremity supported;Feet supported Sitting balance-[name]: Fair Sitting balance - Comments: close supervision     Standing balance-[name]: Poor Standing balance comment: reliant on external assist                            Cognition Arousal/Alertness: Awake/alert Behavior During Therapy: Flat affect Overall Cognitive Status: Impaired/Different from baseline Area of Impairment: Safety/judgement;Problem solving;Following commands                       Following Commands: Follows one step commands inconsistently Safety/Judgement: Decreased awareness of safety;Decreased awareness of deficits   Problem Solving: Slow processing General Comments: Pt inconsistent following commands today reporting," I want to go to sleep. Move out of my way.  You guys don't know anything."      Exercises  General Comments        Pertinent Vitals/Pain Pain Assessment: Faces Faces Pain Scale: Hurts even more Pain Location: B feet Pain Descriptors / Indicators: Burning;Discomfort Pain Intervention(s): Monitored during session;Repositioned    Home Living                      Prior Function            PT Goals (current goals can now be found in the care plan section) Progress towards PT goals: Progressing  toward goals    Frequency    Min 3X/week      PT Plan Current plan remains appropriate    Co-evaluation              AM-PAC PT "6 Clicks" Mobility   Outcome Measure  Help needed turning from your back to your side while in a flat bed without using bedrails?: A Lot Help needed moving from lying on your back to sitting on the side of a flat bed without using bedrails?: A Lot Help needed moving to and from a bed to a chair (including a wheelchair)?: A Lot Help needed standing up from a chair using your arms (e.g., wheelchair or bedside chair)?: A Lot Help needed to walk in hospital room?: A Lot Help needed climbing 3-5 steps with a railing? : Total 6 Click Score: 11    End of Session Equipment Utilized During Treatment: Gait belt Activity Tolerance: Treatment limited secondary to agitation Patient left: in chair;with call bell/phone within reach;with chair alarm set Nurse Communication: Mobility status PT Visit Diagnosis: Other abnormalities of gait and mobility (R26.89);Difficulty in walking, not elsewhere classified (R26.2);Muscle weakness (generalized) (M62.81)     Time: 4696-2952 PT Time Calculation (min) (ACUTE ONLY): 22 min  Charges:  $Therapeutic Activity: 8-22 mins                     Brian Wolf , PTA Acute Rehabilitation Services Pager [phone] Office [phone]     Brian Wolf Brian Wolf 04/14/2020, 3:43 PM

## 2020-04-14 NOTE — Progress Notes (Signed)
Pt unable to wear CPAP at this time due to pt seems confused at this time.

## 2020-04-14 NOTE — Plan of Care (Signed)
  Problem: Health Behavior/Discharge Planning: Goal: Ability to manage health-related needs will improve Outcome: Progressing   Problem: Clinical Measurements: Goal: Ability to maintain clinical measurements within normal limits will improve Outcome: Progressing Goal: Will remain free from infection Outcome: Progressing   

## 2020-04-14 NOTE — Progress Notes (Signed)
Occupational Therapy Treatment Patient Details Name: Carroll Lingelbach MRN: [id number] DOB: 1931/08/24 Today's Date: 04/14/2020    History of present illness Pt is an 87 y/o male admitted 3/5 secondary to dark stools; thought to be secondary to GI bleed. Pt is s/p EGD on 3/6 that showed large duodenal ulcer. Since admission, pt has developed wheezing as well. Thought to be secondary to CHF and possible aspiration PNA. Pt is s/p thoracentesis on 3/8. PMH includes HTN, CVA, CKD, and OSA on CPAP.   OT comments  Pt in recliner upon arrival. Transport arriving for pt. Mod A +2 for standing to [place], pt wanting OT and NT to lift him, max verbal cues to initiate getting up to [facility] amd max verbal cues to sit onto bed. Pt tyring to take feet off of Stedy vs sitting down on bed as instructed. OT to follow acutely to maximize level of function and safety  Follow Up Recommendations  Home health OT;Supervision/Assistance - 24 hour    Equipment Recommendations  Other (comment) (RW)    Recommendations for Other Services      Precautions / Restrictions Precautions Precautions: Fall Restrictions Weight Bearing Restrictions: No       Mobility Bed Mobility Overal bed mobility: Needs Assistance Bed Mobility: Rolling Rolling: Supervision   Supine to sit: Max assist;HOB elevated Sit to supine: Min assist   General bed mobility comments: min A with LEs back onto bed, mod verbal cues to side lye onto  bed with trunk/UB. Rolling in bed to adjust pads underneath pt    Transfers Overall transfer level: Needs assistance Equipment used: Rolling walker (2 wheeled) Transfers: Sit to/from Stand Sit to Stand: Mod assist;+2 physical assistance         General transfer comment: mod A +2 for standing to [place], pt wanting OT and NT to lift him, max verbal cues to initiate getting up to [facility] amd max verbal cues to sit onto bed. Pt tyring to take feet off of Stedy vs sitting down on bed as instructed     Balance Overall balance assessment: Needs assistance Sitting-balance support: No upper extremity supported;Feet supported Sitting balance-[name]: Fair Sitting balance - Comments: close supervision   Standing balance support: Bilateral upper extremity supported;During functional activity Standing balance-[name]: Poor Standing balance comment: reliant on external assist                           ADL either performed or assessed with clinical judgement   ADL Overall ADL's : Needs assistance/impaired                                       General ADL Comments: pt continues to present with decreased ability to care for self, decreased activity tolerance and generalized deconditioning. Pt declined changing gown and washing face, transport arrived to take pt and pt returned to bed using Stedy     Vision Patient Visual Report: No change from baseline     Perception     Praxis      Cognition Arousal/Alertness: Awake/alert Behavior During Therapy: Flat affect Overall Cognitive Status: Impaired/Different from baseline Area of Impairment: Safety/judgement;Problem solving;Following commands                       Following Commands: Follows one step commands inconsistently Safety/Judgement: Decreased awareness of safety;Decreased awareness of  deficits   Problem Solving: Slow processing;Requires verbal cues;Requires tactile cues;Difficulty sequencing;Decreased initiation General Comments: "I just want to lay down"        Exercises     Shoulder Instructions       General Comments      Pertinent Vitals/ Pain       Pain Assessment: Faces Faces Pain Scale: Hurts even more Pain Location: B feet Pain Descriptors / Indicators: Burning;Discomfort Pain Intervention(s): Monitored during session;Repositioned  Home Living                                          Prior Functioning/Environment               Frequency  Min 2X/week        Progress Toward Goals  OT Goals(current goals can now be found in the care plan section)  Progress towards OT goals: OT to reassess next treatment     Plan Discharge plan remains appropriate;Frequency remains appropriate    Co-evaluation                 AM-PAC OT "6 Clicks" Daily Activity     Outcome Measure   Help from another person eating meals?: A Little Help from another person taking care of personal grooming?: A Little Help from another person toileting, which includes using toliet, bedpan, or urinal?: A Lot Help from another person bathing (including washing, rinsing, drying)?: A Lot Help from another person to put on and taking off regular upper body clothing?: A Lot Help from another person to put on and taking off regular lower body clothing?: A Lot 6 Click Score: 14    End of Session Equipment Utilized During Treatment: Other (comment) Charlaine Dalton)  OT Visit Diagnosis: Unsteadiness on feet (R26.81);Muscle weakness (generalized) (M62.81)   Activity Tolerance Treatment limited secondary to agitation   Patient Left in chair;in bed;with family/visitor present;with nursing/sitter in room   Nurse Communication          Time: 0254-2706 OT Time Calculation (min): 11 min  Charges: OT General Charges $OT Visit: 1 Visit OT Treatments $Therapeutic Activity: 8-22 mins     [name] 04/14/2020, 4:46 PM

## 2020-04-14 NOTE — Progress Notes (Signed)
Daily Progress Note   Patient Name: Brian Wolf       Date: 04/14/2020 DOB: 1931/11/17  Age: 87 y.o. MRN#: [id number] Attending Physician: Lavina Hamman, MD Primary Care Physician: Brian Morning, DO Admit Date: 04/05/2020  Reason for Consultation/Follow-up: To discuss complex medical decision making related to patient's goals of care  Subjective: Dr. Alfonse Wolf and I visited with the patient and his wife at bedside.  Brian Wolf expressed that she was quite unhappy that I had implied she was not taking good care of her husband when I met her on Saturday.  This surprised me (because I thought the opposite) and I replied that she must have me confused with someone else.  Brian Wolf was adamant that I offended her so I gently apologized two times and quietly left the room.  Brian Wolf forwarded his notarized HCPOA paperwork to me today.   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  Out of respect for his stepmother he expertly negotiates support for her and her wishes with care for his father.  After receiving his HCPOA paperwork I asked Dr Brian Wolf to please update Dr. Geryl Wolf on his father's condition.    I spoke with Dr. Geryl Wolf later in the day.  He explained that his father and step mother may need more GOC types of conversations particularly around the possibility of hemodialysis in the future.  He asked that Palliative follow them outpatient.  I expressed that I'm concerned his step mother may have a fear of the name Palliative and suggested that perhaps [facility] thru [place] may be helpful.   Assessment: Patient improving but will likely continue to suffer with progressive heart and kidney failure.  Family not ready to consider Hospice.      Patient Profile/HPI:  87 y.o. male  with past medical history of dementia, CVA,stage IV CKD, bradycardia, diastolic heart failure, gastric ulcer, and HTN  who was admitted on 04/05/2020 with dark stools.  The patient's hgb dropped to 6.7 and he required transfusion.   He underwent EGD with biopsy and was found to have a cratered 10 mm ulceration in the second portion of his duodenum.   His creatinine was elevated over baseline on admission and has continued to trend up (3.15).  Nephrology was consulted.  CXR  showed vascular congestion and his LVEF was found to be 35 - 40% a significant change from prior.  Cardiology consulted.  PMT has been asked to assist in the establishment of goals of care.  Patient is a DNR.  Length of Stay: 9   Vital Signs: BP (!) 136/53 (BP Location: Left Arm)   Pulse 65   Temp 98.6 F (37 C) (Oral)   Resp 18   Ht '5\' 5"'  (1.651 m)   Wt 69.4 kg   SpO2 99%   BMI 25.46 kg/m  SpO2: SpO2: 99 % O2 Device: O2 Device: Room Air O2 Flow Rate: O2 Flow Rate (L/min): 1 L/min       Palliative Assessment/Data:  40%     Palliative Care Plan    Recommendations/Plan:  Dr. Geryl Wolf son is HCPOA.  He has emailed [name] his notarized paperwork.  We will attempt to load it into [employer]  Dr. Geryl Wolf requests that perhaps [facility] could offer support services including [facility] in the home.  (Please do not use the word Palliative).  Patient will continue to suffer with progressive heart and kidney failure.  Patient and wife would likely benefit from Care connections support to navigate these chronic illnesses  Code Status:  DNR  Prognosis:   < 12 months would not be surprising.    Discharge Planning:  To home with support.  Potentially [facility].  Care plan was discussed with Dr. Posey Wolf, Dr. Geryl Wolf  Thank you for allowing the Palliative Medicine Team to assist in the care of this patient.  Total time spent:  45 min.     Greater than 50%  of this time was spent counseling and coordinating care related to the  above assessment and plan.  Florentina Jenny, PA-C Palliative Medicine  Please contact Palliative MedicineTeam phone at [phone] for questions and concerns between 7 am - 7 pm.   Please see AMION for individual provider pager numbers.

## 2020-04-14 NOTE — Progress Notes (Signed)
[facility] Progress Note  Patient: Brian Wolf    OTL:[id number]  DOA: 04/05/2020     Date of Service: the patient was seen and examined on 04/14/2020  Brief hospital course: Past medical history of HTN, CVA, CKD, OSA on CPAP, sinus node dysfunction without a pacemaker.  Presents with severe anemia found to have duodenal ulcer on EGD.  Now appears to be having volume overload requiring IV diuresis.  This resulted in worsening renal function and now appears to have metabolic encephalopathy from uremia. Currently plan is monitor for improvement in renal function and mentation.  We will also consider initiation of diuresis tomorrow.  Assessment and Plan: 1.  Acute upper GI bleed secondary to duodenal ulcer Acute blood loss anemia Chronic active gastritis EGD 3/6 shows nonbleeding duodenal ulcer.  On PPI 40 mg daily indefinitely. Baseline hemoglobin around 9-10. On admission hemoglobin 6.7. SP 2 PRBC transfusion.  Currently H&H relatively stable. Reticulocyte count elevated suggesting acute anemia. Iron level low. INR normal. For now continue PPI p.o. and monitor H&H daily. Transfuse for hemoglobin less than 7. Verified with the GI and pathology.  H. pylori actually is negative.  No further treatment.  2.  AKI on CKD stage IV Metabolic acidosis Uremia. Baseline serum creatinine around 2. Worsening with diarrhea.  At peak serum creatinine was 3.7 and BUN was 81. For last few days serum creatinine has been improving.  BUN also started trending down. Mild worsening of mental status from baseline likely associated with his hospital stay as well as worsening uremia. Nephrology consulted. Renal ultrasound shows no obstruction. Avoid nephrotoxic medication.  3.  Acute on chronic combined diastolic and systolic CHF Right pleural effusion Third spacing with hypoalbuminemia Echocardiogram on 3/6 EF 35 to 40%. Grade 1 diastolic dysfunction. Received IV diuresis with worsening renal  function therefore currently diuresis on hold. Chest x-ray showed evidence of right-sided pleural effusion. Underwent thoracentesis with removal of 1.2 L of fluid. Fluid analysis consistent with transudative fluid. Repeat x-ray shows improvement in volume status. CXR on 3/11 shows stable pleural effusion. Monitor for reaccumulation. Seen by cardiology recommending low-dose metoprolol XL succinate 25 mg daily with hydralazine and Imdur.  Outpatient follow-up for repeat echocardiogram and further work-up if needed.  4.  Aspiration pneumonia. Proteus UTI. There was initial concern for consolidation on the right side with aspiration. Currently significant improvement in chest x-ray after removal of the fluid. Due to ongoing cough with greenish sputum will initiate antibiotic. Also urine growing Proteus.  Will continue ceftriaxone.  Total 5-day treatment course.  5.  Atelectasis. OSA Recommended patient to remain compliant with CPAP here in the hospital.  Patient currently agreeable. Incentive spirometry highly recommended and educated with the patient.  6.  Acute metabolic encephalopathy Undiagnosed dementia with intermittent agitation. Mild worsening per family likely in the setting of procedures, anesthesia and uremia. Monitor.  Avoid psychotropic medication. Currently does not have any insight in his medical condition. Prognosis remains very poor due to the patient's cooperation with his medical treatment. Palliative care consulted.  7.  HLD. Continue statin.  8.  BPH. Continue Flomax.  9.  Constipation. Lactulose for bowel regimen.  10.  Essential hypertension. Blood pressure stable. Continue current regimen although avoiding hypotensive events. Holding Aldactone and losartan in the setting of AKI.  11.  History of CVA. On aspirin prior to admission.  Currently on hold secondary to duodenal ulcer. We will discuss with GI regarding timing of resumption.  12.   Glaucoma. Continue home  eyedrops.  13.  Goals of care conversation. Outpatient palliative care assistance. Prognosis remains poor due to multiple comorbidities. Discussed with patient's son who is the healthcare power of attorney on 3/14.  Recommended to pursue hospice for the patient.  Currently patient's spouse who is the primary caregiver is not on board. At present plan will be to transition to SNF if needed per PT.  Diet: Cardiac diet DVT Prophylaxis:   SCDs Start: 04/05/20 1053    Advance goals of care discussion: DNR discussed in detail regarding patient's poor prognosis with multiple comorbidities. Palliative care consulted  Family Communication: Wife was present at bedside, at the time of interview.  [facility] POA documents were available for son as [facility] POA. Discussed with him on the phone in presence of wife. Opportunity was given to ask question and all questions were answered satisfactorily.   Disposition:  Status is: Inpatient  Remains inpatient appropriate because:IV treatments appropriate due to intensity of illness or inability to take PO   Dispo: The patient is from: Home              Anticipated d/c is to: SNF              Patient currently is not medically stable to d/c.   Difficult to place patient No  Subjective: No nausea no vomiting.  No fever no chills.  No chest pain.  No abdominal pain.  No diarrhea no constipation but remains confused.  Remains lethargic.  Per PT required more assistance today.  Physical Exam:  General: Appear in mild distress, no Rash; Oral Mucosa Clear, moist. no Abnormal Neck Mass Or lumps, Conjunctiva normal  Cardiovascular: S1 and S2 Present, no Murmur, Respiratory: increased respiratory effort, Bilateral Air entry present and bilateral  Crackles, no wheezes Abdomen: Bowel Sound present, Soft and no tenderness Extremities: Bilateral upper extremity edema, no pedal edema Neurology: alert and oriented to time, place, and person affect  appropriate. no new focal deficit Gait not checked due to patient safety concerns  Vitals:   04/13/20 1700 04/13/20 1931 04/14/20 0803 04/14/20 1944  BP: (!) 117/49 (!) 122/58 (!) 136/53 (!) 137/50  Pulse: 72 74 65 71  Resp: 17 18 18 16   Temp: 99.3 F (37.4 C) 98.7 F (37.1 C) 98.6 F (37 C) 98.5 F (36.9 C)  TempSrc:  Oral Oral Oral  SpO2: 98% 98% 99% 97%  Weight:      Height:        Intake/Output Summary (Last 24 hours) at 04/14/2020 2023 Last data filed at 04/14/2020 1500 Gross per 24 hour  Intake 600 ml  Output --  Net 600 ml   Filed Weights   04/05/20 0641  Weight: 69.4 kg    Data Reviewed: I have personally reviewed and interpreted daily labs, tele strips, imaging. I reviewed all nursing notes, pharmacy notes, vitals, pertinent old records I have discussed plan of care as described above with RN and patient/family.  CBC: Recent Labs  Lab 04/09/20 0251 04/09/20 1208 04/12/20 0330 04/13/20 0738 04/14/20 0353  WBC 12.1* 15.1* 17.4* 15.9* 15.9*  HGB 7.7* 8.6* 7.7* 8.1* 7.9*  HCT 24.1* 26.2* 23.2* 25.7* 25.2*  MCV 93.8 93.2 93.2 95.9 96.6  PLT 302 359 303 315 902   Basic Metabolic Panel: Recent Labs  Lab 04/09/20 0251 04/10/20 0348 04/11/20 0233 04/12/20 0330 04/13/20 0738 04/14/20 0353  NA 134* 136 137 137 136 136  K 4.3 3.9 3.3* 3.9 3.8 3.6  CL 104 105 107 109  107 106  CO2 20* 22 21* 21* 22 21*  GLUCOSE 122* 105* 103* 98 97 103*  BUN 72* 81* 74* 69* 60* 57*  CREATININE 3.67* 3.75* 3.42* 3.15* 2.83* 2.78*  CALCIUM 7.4* 7.8* 7.7* 7.5* 7.7* 7.6*  MG 2.2  --   --  2.0 2.1 2.1  PHOS 5.3* 4.4 3.4 3.2 3.3 3.2    Studies: VAS [name] UPPER EXTREMITY VENOUS DUPLEX  Result Date: 04/14/2020 UPPER VENOUS STUDY  Indications: Edema Comparison Study: No prior studies. Performing Technologist: Rogelia Rohrer RVT, RDMS  Examination Guidelines: A complete evaluation includes B-mode imaging, spectral Doppler, color Doppler, and power Doppler as needed of all accessible  portions of each vessel. Bilateral testing is considered an integral part of a complete examination. Limited examinations for reoccurring indications may be performed as noted.  Right Findings: +----------+------------+---------+-----------+----------+-------+ RIGHT     CompressiblePhasicitySpontaneousPropertiesSummary +----------+------------+---------+-----------+----------+-------+ IJV           Full       Yes       Yes                      +----------+------------+---------+-----------+----------+-------+ Subclavian    Full       Yes       Yes                      +----------+------------+---------+-----------+----------+-------+ Axillary      Full       Yes       Yes                      +----------+------------+---------+-----------+----------+-------+ Brachial      Full       Yes       Yes                      +----------+------------+---------+-----------+----------+-------+ Radial        Full                                          +----------+------------+---------+-----------+----------+-------+ Ulnar         Full                                          +----------+------------+---------+-----------+----------+-------+ Cephalic      Full                                          +----------+------------+---------+-----------+----------+-------+ Basilic       Full                                          +----------+------------+---------+-----------+----------+-------+  Left Findings: +----------+------------+---------+-----------+----------+-------+ LEFT      CompressiblePhasicitySpontaneousPropertiesSummary +----------+------------+---------+-----------+----------+-------+ IJV           Full       Yes       Yes                      +----------+------------+---------+-----------+----------+-------+ Subclavian    Full       Yes  Yes                       +----------+------------+---------+-----------+----------+-------+ Axillary      Full       Yes       Yes                      +----------+------------+---------+-----------+----------+-------+ Brachial      Full       Yes       Yes                      +----------+------------+---------+-----------+----------+-------+ Radial        Full                                          +----------+------------+---------+-----------+----------+-------+ Ulnar         Full                                          +----------+------------+---------+-----------+----------+-------+ Cephalic      Full                                          +----------+------------+---------+-----------+----------+-------+ Basilic       Full                                          +----------+------------+---------+-----------+----------+-------+  Summary:  Right: No evidence of deep vein thrombosis in the upper extremity. No evidence of superficial vein thrombosis in the upper extremity.  Left: No evidence of deep vein thrombosis in the upper extremity. No evidence of superficial vein thrombosis in the upper extremity.  *See table(s) above for measurements and observations.    Preliminary     Scheduled Meds: . [START ON 04/15/2020] feeding supplement  237 mL Oral BID BM  . hydrALAZINE  50 mg Oral TID  . isosorbide dinitrate  30 mg Oral TID  . lactulose  10 g Oral Daily  . latanoprost  1 drop Both Eyes QHS  . mouth rinse  15 mL Mouth Rinse BID  . metoprolol succinate  25 mg Oral Daily  . multivitamin with minerals  1 tablet Oral Daily  . pantoprazole  40 mg Oral BID AC  . saccharomyces boulardii  250 mg Oral BID  . simvastatin  40 mg Oral Daily  . sodium chloride flush  3 mL Intravenous Q12H  . sucralfate  1 g Oral TID WC & HS  . tamsulosin  0.4 mg Oral Daily   Continuous Infusions: . cefTRIAXone (ROCEPHIN)  IV 1 g (04/14/20 1219)   PRN Meds: ipratropium-albuterol, loratadine  Time  spent: 35 minutes  Author: Berle Mull, MD [facility] 04/14/2020 8:23 PM  To reach On-call, see care teams to locate the attending and reach out via [email or web address]. Between 7PM-7AM, please contact night-coverage If you still have difficulty reaching the attending provider, please page the [facility] (Director on Call) for [facility] on amion for assistance.

## 2020-04-14 NOTE — Progress Notes (Addendum)
Initial Nutrition Assessment  DOCUMENTATION CODES:   Non-severe (moderate) malnutrition in context of chronic illness  INTERVENTION:   -discontinue calorie count  -liberalize diet to low-sodium per MD  -discontinue Nepro with Carb Steady, restrictions are not needed at this time due to normal lab values  -Ensure Enlive BID, each supplement provides 350 kcal, 20 grams protein   -MVI with minerals po daily  -assistance with feeding  -reweigh pt   NUTRITION DIAGNOSIS:   Moderate Malnutrition related to chronic illness (CKD4, CHF) as evidenced by moderate muscle depletion,mild fat depletion.  GOAL:   Patient will meet greater than or equal to 90% of their needs  MONITOR:   PO intake,Supplement acceptance,Skin,I & O's,Labs,Weight trends  REASON FOR ASSESSMENT:   Consult Calorie Count  ASSESSMENT:   7 YOM admitted for acute upper GI bleed. PMH of HTN, CVA, GI bleed with history of duodenal ulcer (10/2019), CKD 4, CHF, chronic active gastritis, dementia.   3/6 - EGD, biopsy 3/8 - thoracentesis 3/09 - SLP eval, recommendation for regular diet; thin liquid  Per chart meal documentation, pt has been consuming 50-100% of meals. Spoke with pt's wife at bedside while pt rested. Pt's wife reports that pt had a poor appetite 2-3 weeks PTA. Pt's wife reports that pt has had a good appetite since being admitted, but mentioned he doesn't always finish his trays due to some foods not aligning to his food preferences. She reports that pt has been consuming 65-100% of his trays on most days. Pt's wife reports that she has been feeding patient due to his weakness, intern discussed with her the option to have assisted feeding. Unable to get a full nutrition history, however pt's wife reports pt had a good appetite PTA and she did mention that pt eats 3 meals a day and multiple snacks. She reports that she makes him whatever he wants (chicken soup, potato soup, steak, etc). Pt's wife reports  that pt does drink 1 Boost per day at home. Pt's wife denied any chewing difficulties, but did not that pt will occasionally struggle with swallowing. Pt's wife reports that pt has not been experiencing any GI distress other than occasional diarrhea, which she felt was likely due to medications.    Pt's wife reports a UBW of 150-160#. She reports no recent weight changes. Pt's weights reviewed. No recent weight since admission.  Admit weight: 153#  Meds reviewed: Nepro Carb Steady (BID), lactulose (daily) Labs reviewed: Corrected Calcium (9.2), CBG (118-140)  I&O's reviewed: +708.7 L since admit   NUTRITION - FOCUSED PHYSICAL EXAM:  Flowsheet Row Most Recent Value  Orbital Region Mild depletion  Upper Arm Region Mild depletion  Thoracic and Lumbar Region No depletion  Buccal Region No depletion  Temple Region No depletion  Clavicle Bone Region Mild depletion  Clavicle and Acromion Bone Region Mild depletion  Scapular Bone Region Unable to assess  Dorsal Hand Mild depletion  Patellar Region Moderate depletion  Anterior Thigh Region Moderate depletion  Posterior Calf Region Moderate depletion  Edema (RD Assessment) Mild  [generalized, RUE]  Hair Reviewed  Eyes Reviewed  Mouth Reviewed  [poor dentition]  Skin Reviewed  Nails Reviewed       Diet Order:   Diet Order            Diet Heart Room service appropriate? Yes; Fluid consistency: Thin  Diet effective now                 EDUCATION NEEDS:   No  education needs have been identified at this time  Skin:  Skin Assessment: Reviewed RN Assessment  Last BM:  3/12 (type 6)  Height:   Ht Readings from Last 1 Encounters:  04/05/20 5\' 5"  (1.651 m)    Weight:   Wt Readings from Last 1 Encounters:  04/05/20 69.4 kg    Ideal Body Weight:  61.8 kg  BMI:  Body mass index is 25.46 kg/m.  Estimated Nutritional Needs:   Kcal:  1900-2100  Protein:  105-120 g  Fluid:  >/=1.9 L    Salvadore Oxford, Dietetic  Intern 04/14/2020 4:09 PM

## 2020-04-15 DIAGNOSIS — I5031 Acute diastolic (congestive) heart failure: Secondary | ICD-10-CM | POA: Diagnosis not present

## 2020-04-15 DIAGNOSIS — Z515 Encounter for palliative care: Secondary | ICD-10-CM | POA: Diagnosis not present

## 2020-04-15 DIAGNOSIS — N179 Acute kidney failure, unspecified: Secondary | ICD-10-CM | POA: Diagnosis not present

## 2020-04-15 DIAGNOSIS — K922 Gastrointestinal hemorrhage, unspecified: Secondary | ICD-10-CM | POA: Diagnosis not present

## 2020-04-15 LAB — CBC
HCT: 24.9 % — ABNORMAL LOW (ref 39.0–52.0)
Hemoglobin: 7.9 g/dL — ABNORMAL LOW (ref 13.0–17.0)
MCH: 30.9 pg (ref 26.0–34.0)
MCHC: 31.7 g/dL (ref 30.0–36.0)
MCV: 97.3 fL (ref 80.0–100.0)
Platelets: 314 10*3/uL (ref 150–400)
RBC: 2.56 MIL/uL — ABNORMAL LOW (ref 4.22–5.81)
RDW: 18.2 % — ABNORMAL HIGH (ref 11.5–15.5)
WBC: 15.3 10*3/uL — ABNORMAL HIGH (ref 4.0–10.5)
nRBC: 0.1 % (ref 0.0–0.2)

## 2020-04-15 LAB — MAGNESIUM: Magnesium: 2 mg/dL (ref 1.7–2.4)

## 2020-04-15 LAB — RENAL FUNCTION PANEL
Albumin: 1.9 g/dL — ABNORMAL LOW (ref 3.5–5.0)
Anion gap: 8 (ref 5–15)
BUN: 53 mg/dL — ABNORMAL HIGH (ref 8–23)
CO2: 20 mmol/L — ABNORMAL LOW (ref 22–32)
Calcium: 7.6 mg/dL — ABNORMAL LOW (ref 8.9–10.3)
Chloride: 107 mmol/L (ref 98–111)
Creatinine, Ser: 2.63 mg/dL — ABNORMAL HIGH (ref 0.61–1.24)
GFR, Estimated: 23 mL/min — ABNORMAL LOW (ref >60)
Glucose, Bld: 100 mg/dL — ABNORMAL HIGH (ref 70–99)
Phosphorus: 3.3 mg/dL (ref 2.5–4.6)
Potassium: 3.8 mmol/L (ref 3.5–5.1)
Sodium: 135 mmol/L (ref 135–145)

## 2020-04-15 MED ORDER — FUROSEMIDE 40 MG PO TABS
40.0000 mg | ORAL_TABLET | Freq: Once | ORAL | Status: AC
  Administered 2020-04-16: 40 mg via ORAL
  Filled 2020-04-15: qty 1

## 2020-04-15 MED ORDER — ALBUMIN HUMAN 25 % IV SOLN
12.5000 g | Freq: Once | INTRAVENOUS | Status: AC
  Administered 2020-04-16: 12.5 g via INTRAVENOUS
  Filled 2020-04-15: qty 50

## 2020-04-15 NOTE — Plan of Care (Signed)
  Problem: Clinical Measurements: Goal: Ability to maintain clinical measurements within normal limits will improve Outcome: Progressing Goal: Will remain free from infection Outcome: Progressing   Problem: Activity: Goal: Risk for activity intolerance will decrease Outcome: Progressing   

## 2020-04-15 NOTE — Progress Notes (Addendum)
Palliative Medicine RN Note: Pt's son emailed our office photos of the HCPOA form. I am attempting to scan these into [employer], but due to the size and quality of the photos, I am unable to print them legibly. I have reached out to HIM for help getting a copy into Mr Carboni's record.   In the interim, I corrected the contact list. POA form lists only son Jaylyn Booher and daughter in law Destan Franchini; pt's wife is not included on the [facility] paperwork at all.  Marjie Skiff Cherylann Ratel, RN, BSN, [facility] Palliative Medicine Team 04/15/2020 9:07 AM Office [phone]    ADDENDUM: HIM has gotten HCPOA into [employer].  Marjie Skiff Fayne Mcguffee, RN, BSN, [facility] Palliative Medicine Team 04/15/2020 12:31 PM Office [phone]

## 2020-04-15 NOTE — Progress Notes (Signed)
[facility] Progress Note  Patient: Brian Wolf    VEL:[id number]  DOA: 04/05/2020     Date of Service: the patient was seen and examined on 04/15/2020  Brief hospital course: Past medical history of HTN, CVA, CKD, OSA on CPAP, sinus node dysfunction without a pacemaker.  Presents with severe anemia found to have duodenal ulcer on EGD.  Now appears to be having volume overload requiring IV diuresis.  This resulted in worsening renal function and now appears to have metabolic encephalopathy from uremia. Currently plan is monitor for improvement in renal function and mentation.  We will attempt diuresis tomorrow with albumin support.  Continue to engage with family regarding goals of care.  Assessment and Plan: 1.  Acute upper GI bleed secondary to duodenal ulcer Acute blood loss anemia Chronic active gastritis EGD 3/6 shows nonbleeding duodenal ulcer.   Verified with the GI and pathology.  H. pylori actually is negative.  No further treatment. Baseline hemoglobin around 9-10. On admission hemoglobin 6.7. SP 2 PRBC transfusion.  Currently H&H relatively stable. Reticulocyte count elevated suggesting acute anemia. Iron level low. INR normal. On PPI 40 mg daily indefinitely. monitor H&H daily. Transfuse for hemoglobin less than 7. Continue Carafate only in the hospital. Continue bowel regimen on discharge.  2.  AKI on CKD stage IV Hyperkalemia Metabolic acidosis Uremia. Baseline serum creatinine around 2. Worsening with diarrhea.  At peak serum creatinine was 3.7 and BUN was 81. For last few days serum creatinine has been improving.  BUN also started trending down. Mild worsening of mental status from baseline likely associated with his hospital stay as well as worsening uremia. Nephrology consulted. Renal ultrasound shows no obstruction. Avoid nephrotoxic medication. Initiate diuresis. Patient ideally would benefit from on Lasix 40 mg as needed for weight gain.  Other  potential regimen would be 40 mg every other day. Avoid Aldactone in the setting of hyperkalemia.  3.  Acute on chronic combined diastolic and systolic CHF Right pleural effusion Third spacing with hypoalbuminemia Echocardiogram on 3/6 EF 35 to 40%. Grade 1 diastolic dysfunction. Received IV diuresis with worsening renal function therefore currently diuresis on hold. Chest x-ray showed evidence of right-sided pleural effusion. Underwent thoracentesis with removal of 1.2 L of fluid. Fluid analysis consistent with transudative fluid. Repeat x-ray shows improvement in volume status. CXR on 3/11 shows stable pleural effusion. Monitor for reaccumulation. Seen by cardiology recommending low-dose metoprolol XL succinate 25 mg daily with hydralazine and Imdur.  Outpatient follow-up for repeat echocardiogram and further work-up if needed.  4.  Aspiration pneumonia. Proteus UTI. There was initial concern for consolidation on the right side with aspiration. Currently significant improvement in chest x-ray after removal of the fluid. Due to ongoing cough with greenish sputum will initiate antibiotic. Also urine growing Proteus.  Will continue ceftriaxone.  Total 5-day treatment course.  Last day 3/16.  5.  Atelectasis. OSA Recommended patient to remain compliant with CPAP here in the hospital.  Patient currently agreeable. Incentive spirometry highly recommended and educated with the patient. Both CPAP and incentive spirometry are not consistent given patient's lack of insight.  6.  Acute metabolic encephalopathy Undiagnosed dementia with intermittent agitation. Mild worsening per family likely in the setting of procedures, anesthesia and uremia. Monitor.  Avoid psychotropic medication. Currently does not have any insight in his medical condition. Prognosis remains very poor due to the patient's cooperation with his medical treatment. Palliative care consulted.  7.  HLD. Continue  statin.  8.  BPH.  Continue Flomax.  9.  Constipation. Lactulose for bowel regimen.  10.  Essential hypertension. Blood pressure stable. Continue current regimen although avoiding hypotensive events. Holding Aldactone and losartan in the setting of AKI.  11.  History of CVA. On aspirin prior to admission.  Currently on hold secondary to duodenal ulcer. We will discuss with GI regarding timing of resumption.  12.  Glaucoma. Continue home eyedrops.  13.  Bilateral upper extremity edema. Doppler negative for DVT. Likely results of third spacing and hypoalbuminemia.  14  Goals of care conversation. Outpatient palliative care assistance. Prognosis remains poor due to multiple comorbidities. Discussed with patient's son who is the healthcare power of attorney on 3/14.  Recommended to pursue hospice for the patient.  Currently patient's spouse who is the primary caregiver is not on board. At present plan will be to transition to SNF if needed per PT. 3/15 discussed with patient he would rather go home in any circumstances instead of going to SNF or long-term placement. Patient unsure regarding plan for going home with hospice.  Wife unsure as well. Discussed with son on the phone, currently okay with care connection program.  Diet: Cardiac diet DVT Prophylaxis:   SCDs Start: 04/05/20 1053    Advance goals of care discussion: DNR discussed in detail regarding patient's poor prognosis with multiple comorbidities. Palliative care consulted  Family Communication: Wife was present at bedside, at the time of interview.  [facility] POA documents were available for son as [facility] POA. Discussed with him on the phone in presence of wife. Opportunity was given to ask question and all questions were answered satisfactorily.   Disposition:  Status is: Inpatient  Remains inpatient appropriate because:IV treatments appropriate due to intensity of illness or inability to take PO   Dispo: The patient  is from: Home              Anticipated d/c is to: SNF              Patient currently is not medically stable to d/c.   Difficult to place patient No  Subjective: Seen multiple times.  No nausea no vomiting but no fever no chills.  No chest pain no abdominal pain.  Minimal oral intake.  Inform wife to allow patient to perform his ADLs.  Physical Exam:  General: Appear in mild distress, no Rash; Oral Mucosa Clear, moist. no Abnormal Neck Mass Or lumps, Conjunctiva normal  Cardiovascular: S1 and S2 Present, no Murmur, Respiratory: increased respiratory effort, Bilateral Air entry present and bilateral  Crackles, no wheezes Abdomen: Bowel Sound present, Soft and no tenderness Extremities: Bilateral upper extremity edema, no pedal edema Neurology: alert and oriented to time, place, and person affect appropriate. no new focal deficit Gait not checked due to patient safety concerns  Vitals:   04/15/20 0341 04/15/20 0500 04/15/20 0834 04/15/20 1406  BP: (!) 128/54  (!) 150/63 (!) 149/64  Pulse: 74  73 78  Resp: 18  18 18   Temp: 98.6 F (37 C)  97.9 F (36.6 C) 97.7 F (36.5 C)  TempSrc: Oral  Oral Oral  SpO2: 95%  97% 97%  Weight:  71.4 kg    Height:        Intake/Output Summary (Last 24 hours) at 04/15/2020 2015 Last data filed at 04/15/2020 1320 Gross per 24 hour  Intake 240 ml  Output --  Net 240 ml   Filed Weights   04/05/20 0641 04/15/20 0500  Weight: 69.4 kg 71.4 kg  Data Reviewed: I have personally reviewed and interpreted daily labs, tele strips, imaging. I reviewed all nursing notes, pharmacy notes, vitals, pertinent old records I have discussed plan of care as described above with RN and patient/family.  CBC: Recent Labs  Lab 04/09/20 1208 04/12/20 0330 04/13/20 0738 04/14/20 0353 04/15/20 0341  WBC 15.1* 17.4* 15.9* 15.9* 15.3*  HGB 8.6* 7.7* 8.1* 7.9* 7.9*  HCT 26.2* 23.2* 25.7* 25.2* 24.9*  MCV 93.2 93.2 95.9 96.6 97.3  PLT 359 303 315 319 462    Basic Metabolic Panel: Recent Labs  Lab 04/09/20 0251 04/10/20 0348 04/11/20 0233 04/12/20 0330 04/13/20 0738 04/14/20 0353 04/15/20 0341  NA 134*   < > 137 137 136 136 135  K 4.3   < > 3.3* 3.9 3.8 3.6 3.8  CL 104   < > 107 109 107 106 107  CO2 20*   < > 21* 21* 22 21* 20*  GLUCOSE 122*   < > 103* 98 97 103* 100*  BUN 72*   < > 74* 69* 60* 57* 53*  CREATININE 3.67*   < > 3.42* 3.15* 2.83* 2.78* 2.63*  CALCIUM 7.4*   < > 7.7* 7.5* 7.7* 7.6* 7.6*  MG 2.2  --   --  2.0 2.1 2.1 2.0  PHOS 5.3*   < > 3.4 3.2 3.3 3.2 3.3   < > = values in this interval not displayed.    Studies: No results found.  Scheduled Meds: . feeding supplement  237 mL Oral BID BM  . hydrALAZINE  50 mg Oral TID  . isosorbide dinitrate  30 mg Oral TID  . lactulose  10 g Oral Daily  . latanoprost  1 drop Both Eyes QHS  . mouth rinse  15 mL Mouth Rinse BID  . metoprolol succinate  25 mg Oral Daily  . multivitamin with minerals  1 tablet Oral Daily  . pantoprazole  40 mg Oral BID AC  . saccharomyces boulardii  250 mg Oral BID  . simvastatin  40 mg Oral Daily  . sodium chloride flush  3 mL Intravenous Q12H  . sucralfate  1 g Oral TID WC & HS  . tamsulosin  0.4 mg Oral Daily   Continuous Infusions: . cefTRIAXone (ROCEPHIN)  IV 1 g (04/15/20 1329)   PRN Meds: ipratropium-albuterol, loratadine  Time spent: 35 minutes  Author: Berle Mull, MD [facility] 04/15/2020 8:15 PM  To reach On-call, see care teams to locate the attending and reach out via [email or web address]. Between 7PM-7AM, please contact night-coverage If you still have difficulty reaching the attending provider, please page the [facility] (Director on Call) for [facility] on amion for assistance.

## 2020-04-15 NOTE — Progress Notes (Signed)
Appropriate Use Committee Chart Review  Chart reviewed by the physician advisor with input from [facility] and the attending MD as needed for review of the appropriateness for SNF referral.  TOC notes, PT/OT/ST notes, nursing notes and physician notes reviewed for medical necessity to determine if the patient's needs are appropriate for short-term rehab to return to a prior level of function versus the likely need for custodial care.  At this time, the patient may not meet Medicare criteria for SNF placement. He has a 29% risk of rehospitalization, and multiple co-morbidities and medical conditions that may portend a poor prognosis, making gains in rehab unlikely. He also is refusing to participate in therapy.   Recommendations: The patient may not be SNF appropriate for short-term rehab/skilled nursing interventions given his lack of participation. It appears the palliative care team is following for goals of care, and that the family is not ready to consider hospice.  Will need to discuss alternative options with the family.  A consult to the Transitions of Care Team has been made to discuss alternative options and discharge planning with the patient/family.    Jacquelynn Cree, MD Chief Physician Advisor  04/15/2020 5:11 PM

## 2020-04-15 NOTE — Progress Notes (Signed)
Physical Therapy Treatment Patient Details Name: Brian Wolf MRN: [id number] DOB: 12/05/1931 Today's Date: 04/15/2020    History of Present Illness Pt is an 87 y/o male admitted 3/5 secondary to dark stools; thought to be secondary to GI bleed. Pt is s/p EGD on 3/6 that showed large duodenal ulcer. Since admission, pt has developed wheezing as well. Thought to be secondary to CHF and possible aspiration PNA. Pt is s/p thoracentesis on 3/8. PMH includes HTN, CVA, CKD, and OSA on CPAP.    PT Comments    Pt continues to have no desire to participate in therapy or improve physically or medically stating "Just stop helping me." However pt's wife continuing to push patient and tries to get him to work with therapy despite his desires not to. Pt repeatedly telling wife to "shup up, all you do is yell.". I believe the wife and patient are not on the same page in regard to his goals of care. At this time pt's spouse is unable to care for patient at this maximal level of assist and he would need to go to SNF unless they receive hospice services. PT is aware that palliative meetings are being held however wife is not ready to listen to her spouse prognosis or goals of care. Acute PT to cont to work with patient until medical plan is changed.    Follow Up Recommendations  SNF;Supervision/Assistance - 24 hour     Equipment Recommendations   (TBD at next venue)    Recommendations for Other Services       Precautions / Restrictions Precautions Precautions: Fall Restrictions Weight Bearing Restrictions: No    Mobility  Bed Mobility Overal bed mobility: Needs Assistance Bed Mobility: Supine to Sit     Supine to sit: Mod assist;HOB elevated     General bed mobility comments: modA for trunk elevation, pt initiated LEs off EOB, modA to pivot all the way around    Transfers Overall transfer level: Needs assistance Equipment used: Rolling walker (2 wheeled) Transfers: Sit to/from  Stand Sit to Stand: Mod assist;+2 physical assistance         General transfer comment: modAx2 to power up and stedy during transition of hands, pt wanting to pull up on walker, bilat feet blocked as pt completed retro-pulsion upon standing  Ambulation/Gait Ambulation/Gait assistance: Mod assist;+2 physical assistance Gait Distance (Feet): 3 Feet Assistive device: Rolling walker (2 wheeled) Gait Pattern/deviations: Step-to pattern;Decreased stride length Gait velocity: Decreased Gait velocity interpretation: <1.8 ft/sec, indicate of risk for recurrent falls General Gait Details: pt pushing RW too far infront despite tactile cues, pt repeatedly asking to sit down in chair however wife yelling "do what they say, move your feet" pt with trunk flexion and minimal bilat foot clearance, pt with no desire to participate and impulsively sat down in chair   Stairs             Wheelchair Mobility    Modified Rankin (Stroke Patients Only)       Balance Overall balance assessment: Needs assistance Sitting-balance support: No upper extremity supported;Feet supported Sitting balance-[name]: Fair Sitting balance - Comments: close supervision   Standing balance support: Bilateral upper extremity supported;During functional activity Standing balance-[name]: Poor Standing balance comment: reliant on external assist                            Cognition Arousal/Alertness: Awake/alert Behavior During Therapy: Flat affect Overall Cognitive Status: Within  Functional Limits for tasks assessed                                 General Comments: pt able to follow one step commands and is aware of current medical situation. Pt becoming very frustrated with wife stating "stop yelling, all you do is yell". Pt not motivated to progress mobility or improve stating "Just stop helping"      Exercises General Exercises - Lower Extremity Ankle Circles/Pumps:  AROM;Both;5 reps;Seated Long Arc Quad: AROM;Both;5 reps;Seated Hip Flexion/Marching: AROM;Both;5 reps;Seated    General Comments General comments (skin integrity, edema, etc.): Pts Spo2 >94% on room air during session      Pertinent Vitals/Pain Pain Assessment: Faces Faces Pain Scale: Hurts even more Pain Location: bilat feet Pain Descriptors / Indicators: Burning;Discomfort Pain Intervention(s): Limited activity within patient's tolerance    Home Living                      Prior Function            PT Goals (current goals can now be found in the care plan section) Acute Rehab PT Goals Patient Stated Goal: "stop helping me" Progress towards PT goals: Not progressing toward goals - comment    Frequency    Min 3X/week      PT Plan Discharge plan needs to be updated    Co-evaluation              AM-PAC PT "6 Clicks" Mobility   Outcome Measure  Help needed turning from your back to your side while in a flat bed without using bedrails?: A Lot Help needed moving from lying on your back to sitting on the side of a flat bed without using bedrails?: A Lot Help needed moving to and from a bed to a chair (including a wheelchair)?: A Lot Help needed standing up from a chair using your arms (e.g., wheelchair or bedside chair)?: A Lot Help needed to walk in hospital room?: A Lot Help needed climbing 3-5 steps with a railing? : Total 6 Click Score: 11    End of Session Equipment Utilized During Treatment: Gait belt Activity Tolerance: Treatment limited secondary to agitation Patient left: in chair;with call bell/phone within reach;with chair alarm set;with nursing/sitter in room Nurse Communication: Mobility status PT Visit Diagnosis: Other abnormalities of gait and mobility (R26.89);Difficulty in walking, not elsewhere classified (R26.2);Muscle weakness (generalized) (M62.81)     Time: 1025-8527 PT Time Calculation (min) (ACUTE ONLY): 16 min  Charges:   $Gait Training: 8-22 mins                     Kittie Plater, PT, DPT Acute Rehabilitation Services Pager #: [phone] Office #: [phone]    Berline Lopes 04/15/2020, 1:56 PM

## 2020-04-16 DIAGNOSIS — K922 Gastrointestinal hemorrhage, unspecified: Secondary | ICD-10-CM | POA: Diagnosis not present

## 2020-04-16 DIAGNOSIS — J90 Pleural effusion, not elsewhere classified: Secondary | ICD-10-CM | POA: Diagnosis not present

## 2020-04-16 DIAGNOSIS — N179 Acute kidney failure, unspecified: Secondary | ICD-10-CM | POA: Diagnosis not present

## 2020-04-16 DIAGNOSIS — D62 Acute posthemorrhagic anemia: Secondary | ICD-10-CM | POA: Diagnosis not present

## 2020-04-16 LAB — MAGNESIUM: Magnesium: 2 mg/dL (ref 1.7–2.4)

## 2020-04-16 LAB — RENAL FUNCTION PANEL
Albumin: 1.8 g/dL — ABNORMAL LOW (ref 3.5–5.0)
Anion gap: 7 (ref 5–15)
BUN: 51 mg/dL — ABNORMAL HIGH (ref 8–23)
CO2: 22 mmol/L (ref 22–32)
Calcium: 7.6 mg/dL — ABNORMAL LOW (ref 8.9–10.3)
Chloride: 108 mmol/L (ref 98–111)
Creatinine, Ser: 2.64 mg/dL — ABNORMAL HIGH (ref 0.61–1.24)
GFR, Estimated: 23 mL/min — ABNORMAL LOW (ref >60)
Glucose, Bld: 101 mg/dL — ABNORMAL HIGH (ref 70–99)
Phosphorus: 2.9 mg/dL (ref 2.5–4.6)
Potassium: 3.9 mmol/L (ref 3.5–5.1)
Sodium: 137 mmol/L (ref 135–145)

## 2020-04-16 LAB — CBC
HCT: 24.9 % — ABNORMAL LOW (ref 39.0–52.0)
Hemoglobin: 8 g/dL — ABNORMAL LOW (ref 13.0–17.0)
MCH: 30.8 pg (ref 26.0–34.0)
MCHC: 32.1 g/dL (ref 30.0–36.0)
MCV: 95.8 fL (ref 80.0–100.0)
Platelets: 322 10*3/uL (ref 150–400)
RBC: 2.6 MIL/uL — ABNORMAL LOW (ref 4.22–5.81)
RDW: 18 % — ABNORMAL HIGH (ref 11.5–15.5)
WBC: 15.2 10*3/uL — ABNORMAL HIGH (ref 4.0–10.5)
nRBC: 0 % (ref 0.0–0.2)

## 2020-04-16 NOTE — Progress Notes (Addendum)
Occupational Therapy Treatment Patient Details Name: Brian Wolf MRN: [id number] DOB: 08-20-1931 Today's Date: 04/16/2020    History of present illness Pt is an 87 y/o male admitted 3/5 secondary to dark stools; thought to be secondary to GI bleed. Pt is s/p EGD on 3/6 that showed large duodenal ulcer. Since admission, pt has developed wheezing as well. Thought to be secondary to CHF and possible aspiration PNA. Pt is s/p thoracentesis on 3/8. PMH includes HTN, CVA, CKD, and OSA on CPAP.   OT comments  Pt with minimal participation this session. Pt wanting to get on [facility] and required mod A +2 to sit EOB and for SPT to BSC, total A for posterior peri hygiene. Pt refused further participation with OT and was returned to bed. At this time pt's spouse is unable to care for patient at this maximal level of assist and he would need to go to SNF unless they receive hospice services, aware that palliative meetings are being held however wife is not ready to listen to her spouse prognosis or goals of care. Acute OT services will continue to attempt to work with pt to maximize level of function and safety  Follow Up Recommendations  SNF;Supervision/Assistance - 24 hour    Equipment Recommendations  Other (comment) (TBD at next venue of care)    Recommendations for Other Services      Precautions / Restrictions Precautions Precautions: Fall Restrictions Weight Bearing Restrictions: No       Mobility Bed Mobility Overal bed mobility: Needs Assistance Bed Mobility: Supine to Sit;Sit to Supine     Supine to sit: Mod assist;HOB elevated Sit to supine: Max assist   General bed mobility comments: mod A to elevate trunk, max A with LEs back onto bed    Transfers Overall transfer level: Needs assistance   Transfers: Sit to/from [employer] Transfers Sit to Stand: Mod assist;+2 physical assistance Stand pivot transfers: Mod assist;+2 physical assistance       General transfer  comment: mod A +2 for sit - stand and bed to [facility] and back to bed    Balance Overall balance assessment: Needs assistance Sitting-balance support: No upper extremity supported;Feet supported Sitting balance-[name]: Fair     Standing balance support: Bilateral upper extremity supported;During functional activity Standing balance-[name]: Poor                             ADL either performed or assessed with clinical judgement   ADL Overall ADL's : Needs assistance/impaired     Grooming: Wash/dry hands;Wash/dry face;Min Systems developer: Moderate assistance;+2 for physical assistance;Cueing for safety;Cueing for sequencing;Stand-pivot;BSC   Toileting- Clothing Manipulation and Hygiene: Total assistance;Sit to/from stand Toileting - Clothing Manipulation Details (indicate cue type and reason): total A for posterior pericare at [place] ADL Comments: pt refused to participate in any other ADLs/selfcare tasks this session     Vision Patient Visual Report: No change from baseline     Perception     Praxis      Cognition Arousal/Alertness: Awake/alert Behavior During Therapy: Flat affect Overall Cognitive Status: Within Functional Limits for tasks assessed Area of Impairment: Safety/judgement;Problem solving;Following commands                       Following Commands:  Follows one step commands inconsistently Safety/Judgement: Decreased awareness of safety;Decreased awareness of deficits   Problem Solving: Slow processing;Requires verbal cues;Requires tactile cues;Difficulty sequencing;Decreased initiation          Exercises     Shoulder Instructions       General Comments      Pertinent Vitals/ Pain       Pain Assessment: Faces Faces Pain Scale: Hurts little more Pain Location: generalized Pain Descriptors / Indicators: Grimacing;Guarding Pain Intervention(s): Limited activity within  patient's tolerance;Monitored during session;Repositioned  Home Living                                          Prior Functioning/Environment              Frequency  Min 2X/week        Progress Toward Goals  OT Goals(current goals can now be found in the care plan section)  Progress towards OT goals: Not progressing toward goals - comment     Plan Discharge plan remains appropriate;Frequency remains appropriate    Co-evaluation                 AM-PAC OT "6 Clicks" Daily Activity     Outcome Measure   Help from another person eating meals?: A Little Help from another person taking care of personal grooming?: A Little Help from another person toileting, which includes using toliet, bedpan, or urinal?: A Lot Help from another person bathing (including washing, rinsing, drying)?: A Lot Help from another person to put on and taking off regular upper body clothing?: A Lot Help from another person to put on and taking off regular lower body clothing?: A Lot 6 Click Score: 14    End of Session Equipment Utilized During Treatment: Other (comment);Gait belt (BSC)  OT Visit Diagnosis: Unsteadiness on feet (R26.81);Muscle weakness (generalized) (M62.81)   Activity Tolerance Patient limited by fatigue   Patient Left with family/visitor present;with nursing/sitter in room;in bed;with call bell/phone within reach   Nurse Communication          Time: 8882-8003 OT Time Calculation (min): 11 min  Charges: OT General Charges $OT Visit: 1 Visit OT Treatments $Self Care/Home Management : 8-22 mins    [name] 04/16/2020, 4:42 PM

## 2020-04-16 NOTE — Progress Notes (Signed)
[facility] Progress Note  Patient: Brian Wolf    HWE:[id number]  DOA: 04/05/2020     Date of Service: the patient was seen and examined on 04/16/2020  Brief hospital course: Past medical history of HTN, CVA, CKD, OSA on CPAP, sinus node dysfunction without a pacemaker.  Presents with severe anemia found to have duodenal ulcer on EGD.  Now appears to be having volume overload requiring IV diuresis.  This resulted in worsening renal function and now appears to have metabolic encephalopathy from uremia. Currently plan is monitor for improvement in renal function and mentation.  We will attempt diuresis tomorrow with albumin support.  Continue to engage with family regarding goals of care.  Assessment and Plan: 1.  Acute upper GI bleed secondary to duodenal ulcer Acute blood loss anemia Chronic active gastritis EGD 3/6 shows nonbleeding duodenal ulcer.   Verified with the GI and pathology.  H. pylori actually is negative.  No further treatment. Baseline hemoglobin around 9-10. On admission hemoglobin 6.7. SP 2 PRBC transfusion.  Currently H&H relatively stable. Reticulocyte count elevated suggesting acute anemia. Iron level low. INR normal. On PPI 40 mg daily indefinitely. monitor H&H daily. Transfuse for hemoglobin less than 7. Continue Carafate only in the hospital. Continue bowel regimen on discharge.  2.  AKI on CKD stage IV Hyperkalemia Metabolic acidosis Uremia. Baseline serum creatinine around 2. Worsening with diarrhea.  At peak serum creatinine was 3.7 and BUN was 81. For last few days serum creatinine has been improving.  BUN also started trending down. Mild worsening of mental status from baseline likely associated with his hospital stay as well as worsening uremia. Nephrology consulted. Renal ultrasound shows no obstruction. Avoid nephrotoxic medication. Initiate diuresis. Patient ideally would benefit from on Lasix 40 mg as needed for weight gain.  Other  potential regimen would be 40 mg every other day. Avoid Aldactone in the setting of hyperkalemia.  3.  Acute on chronic combined diastolic and systolic CHF Right pleural effusion Third spacing with hypoalbuminemia Echocardiogram on 3/6 EF 35 to 40%. Grade 1 diastolic dysfunction. Received IV diuresis with worsening renal function therefore currently diuresis on hold. Chest x-ray showed evidence of right-sided pleural effusion. Underwent thoracentesis with removal of 1.2 L of fluid. Fluid analysis consistent with transudative fluid. Repeat x-ray shows improvement in volume status. CXR on 3/11 shows stable pleural effusion. Monitor for reaccumulation. Seen by cardiology recommending low-dose metoprolol XL succinate 25 mg daily with hydralazine and Imdur.  Outpatient follow-up for repeat echocardiogram and further work-up if needed.  4.  Aspiration pneumonia. Proteus UTI. There was initial concern for consolidation on the right side with aspiration. Currently significant improvement in chest x-ray after removal of the fluid. Due to ongoing cough with greenish sputum will initiate antibiotic. Also urine growing Proteus.  Will continue ceftriaxone.  Total 5-day treatment course.  Last day 3/16.  5.  Atelectasis. OSA Recommended patient to remain compliant with CPAP here in the hospital.  Patient currently agreeable. Incentive spirometry highly recommended and educated with the patient. Both CPAP and incentive spirometry are not consistent given patient's lack of insight.  6.  Acute metabolic encephalopathy Undiagnosed dementia with intermittent agitation. Mild worsening per family likely in the setting of procedures, anesthesia and uremia. Monitor.  Avoid psychotropic medication. Currently does not have any insight in his medical condition. Prognosis remains very poor due to the patient's cooperation with his medical treatment. Palliative care consulted.  7.  HLD. Continue  statin.  8.  BPH.  Continue Flomax.  9.  Constipation. Lactulose for bowel regimen.  10.  Essential hypertension. Blood pressure stable. Continue current regimen although avoiding hypotensive events. Holding Aldactone and losartan in the setting of AKI.  11.  History of CVA. On aspirin prior to admission.  Currently on hold secondary to duodenal ulcer. We will discuss with GI regarding timing of resumption.  12.  Glaucoma. Continue home eyedrops.  13.  Bilateral upper extremity edema. Doppler negative for DVT. Likely results of third spacing and hypoalbuminemia.  14  Goals of care conversation. Outpatient palliative care assistance. Prognosis remains poor due to multiple comorbidities. Discussed with patient's son who is the healthcare power of attorney on 3/14.  Recommended to pursue hospice for the patient.  Currently patient's spouse who is the primary caregiver is not on board. At present plan will be to transition to SNF if needed per PT. 3/15 discussed with patient he would rather go home in any circumstances instead of going to SNF or long-term placement. Discussed with son on the phone, currently okay with care connection program. Will ask TOC to help arrange [facility] resources on discharge  Diet: Cardiac diet DVT Prophylaxis:   SCDs Start: 04/05/20 1053    Advance goals of care discussion: DNR discussed in detail regarding patient's poor prognosis with multiple comorbidities. Palliative care consulted  Family Communication: Wife was present at bedside, at the time of interview.  [facility] POA documents were available for son as [facility] POA. Discussed with him on the phone. Opportunity was given to ask question and all questions were answered satisfactorily.   Disposition:  Status is: Inpatient  Remains inpatient appropriate because:IV treatments appropriate due to intensity of illness or inability to take PO   Dispo: The patient is from: Home              Anticipated d/c is  to: SNF              Patient currently is not medically stable to d/c.   Difficult to place patient No  Subjective:  Awake and alert. He has a productive cough. Overall shortness of breath is better. He is still weak  Physical Exam:  General exam: Alert, awake, no distress Respiratory system: coarse breath sounds bilaterally. Respiratory effort normal. Cardiovascular system:RRR. No murmurs, rubs, gallops. Gastrointestinal system: Abdomen is nondistended, soft and nontender. No organomegaly or masses felt. Normal bowel sounds heard. Central nervous system:  No focal neurological deficits. Extremities: patient does have some anasarca in upper and lower extremities Skin: No rashes, lesions or ulcers Psychiatry: Judgement and insight appear normal. Mood & affect appropriate.    Vitals:   04/16/20 0626 04/16/20 0721 04/16/20 1526 04/16/20 1939  BP: (!) 133/54 139/60 (!) 138/52 (!) 133/51  Pulse: 68 70 70 75  Resp: 18 17 17 17   Temp: 99.3 F (37.4 C) 98.7 F (37.1 C) 98.7 F (37.1 C) 98 F (36.7 C)  TempSrc: Oral Oral Oral Oral  SpO2: 94% 96% 97% 95%  Weight:      Height:        Intake/Output Summary (Last 24 hours) at 04/16/2020 1958 Last data filed at 04/16/2020 1700 Gross per 24 hour  Intake 240 ml  Output -  Net 240 ml   Filed Weights   04/05/20 0641 04/15/20 0500 04/16/20 0500  Weight: 69.4 kg 71.4 kg 71.4 kg    Data Reviewed: I have personally reviewed and interpreted daily labs, tele strips, imaging. I reviewed all nursing notes, pharmacy notes, vitals,  pertinent old records I have discussed plan of care as described above with RN and patient/family.  CBC: Recent Labs  Lab 04/12/20 0330 04/13/20 0738 04/14/20 0353 04/15/20 0341 04/16/20 0610  WBC 17.4* 15.9* 15.9* 15.3* 15.2*  HGB 7.7* 8.1* 7.9* 7.9* 8.0*  HCT 23.2* 25.7* 25.2* 24.9* 24.9*  MCV 93.2 95.9 96.6 97.3 95.8  PLT 303 315 319 314 299   Basic Metabolic Panel: Recent Labs  Lab 04/12/20 0330  04/13/20 0738 04/14/20 0353 04/15/20 0341 04/16/20 0610  NA 137 136 136 135 137  K 3.9 3.8 3.6 3.8 3.9  CL 109 107 106 107 108  CO2 21* 22 21* 20* 22  GLUCOSE 98 97 103* 100* 101*  BUN 69* 60* 57* 53* 51*  CREATININE 3.15* 2.83* 2.78* 2.63* 2.64*  CALCIUM 7.5* 7.7* 7.6* 7.6* 7.6*  MG 2.0 2.1 2.1 2.0 2.0  PHOS 3.2 3.3 3.2 3.3 2.9    Studies: No results found.  Scheduled Meds: . feeding supplement  237 mL Oral BID BM  . hydrALAZINE  50 mg Oral TID  . isosorbide dinitrate  30 mg Oral TID  . lactulose  10 g Oral Daily  . latanoprost  1 drop Both Eyes QHS  . mouth rinse  15 mL Mouth Rinse BID  . metoprolol succinate  25 mg Oral Daily  . multivitamin with minerals  1 tablet Oral Daily  . pantoprazole  40 mg Oral BID AC  . saccharomyces boulardii  250 mg Oral BID  . simvastatin  40 mg Oral Daily  . sodium chloride flush  3 mL Intravenous Q12H  . sucralfate  1 g Oral TID WC & HS  . tamsulosin  0.4 mg Oral Daily   Continuous Infusions:  PRN Meds: ipratropium-albuterol, loratadine  Time spent: 35 minutes  Author: Kathie Dike, MD [facility] 04/16/2020 7:58 PM  To reach On-call, see care teams to locate the attending and reach out via [email or web address]. Between 7PM-7AM, please contact night-coverage If you still have difficulty reaching the attending provider, please page the [facility] (Director on Call) for [facility] on amion for assistance.

## 2020-04-16 NOTE — Progress Notes (Signed)
  Speech Language Pathology Treatment: Dysphagia  Patient Details Name: Brian Wolf MRN: [id number] DOB: 03/14/1931 Today's Date: 04/16/2020 Time: 1120-1130 SLP Time Calculation (min) (ACUTE ONLY): 10 min  Assessment / Plan / Recommendation Clinical Impression  Pt demonstrates ongoing soft cough after each sip, which SLP and wife encouraged to aid in secretion clearance. Wife able to demonstrate and verbalize assisted Nickolas tuck strategy without any question cues. SLP did need to reinforce benefit of oral care to reduce bacterial load. Education complete. Will sign off at this time.   HPI HPI: Pt is an 87 year old male who presented with recurrent ulcer disease in the absence of ulcerogenic medication exposure. Pt admitted with melena and anemia. EGD showed clean-based duodenal ulcer which is the presumed source of the patient's recent bleeding. Pt is on IV protonix. CXR showed  relatively stable cardiomegaly with right-sided pleural effusion with pulmonary vascular congestion and possible bibasilar atelectasis. Pt underwent and has improved significantly with antibiotics. Cardiologist writes "Chest x-ray has also demonstrated significant improvement and BNP has trended down since yesterday.  Patient appears euvolemic on exam.  Agree that patient's dyspnea likely primarily driven by aspiration pneumonia with pneumonitis and reactive airway disease, particularly in view of responsiveness to antibiotics and present pulmonary medications."  Wife denies observations of esophageal or pharyngeal dysphagia but reports his appetite has been poor. Only improved today. Pt has been been evalauted clinically by SLP in prior admissions, never found to have dysphagia.      SLP Plan  Continue with current plan of care       Recommendations  Diet recommendations: Thin liquid;Regular Liquids provided via: Cup;Straw Medication Administration: Whole meds with liquid Supervision: Trained caregiver to feed  patient Compensations: Slow rate;Small sips/bites;Schofield tuck Postural Changes and/or Swallow Maneuvers: Seated upright 90 degrees                Follow up Recommendations: Home health SLP Plan: Continue with current plan of care       GO                Kairav Russomanno, Katherene Ponto 04/16/2020, 12:14 PM

## 2020-04-16 NOTE — Plan of Care (Signed)

## 2020-04-17 DIAGNOSIS — K922 Gastrointestinal hemorrhage, unspecified: Secondary | ICD-10-CM | POA: Diagnosis not present

## 2020-04-17 DIAGNOSIS — I5031 Acute diastolic (congestive) heart failure: Secondary | ICD-10-CM | POA: Diagnosis not present

## 2020-04-17 DIAGNOSIS — N179 Acute kidney failure, unspecified: Secondary | ICD-10-CM | POA: Diagnosis not present

## 2020-04-17 DIAGNOSIS — D62 Acute posthemorrhagic anemia: Secondary | ICD-10-CM | POA: Diagnosis not present

## 2020-04-17 LAB — CBC
HCT: 25.3 % — ABNORMAL LOW (ref 39.0–52.0)
Hemoglobin: 8 g/dL — ABNORMAL LOW (ref 13.0–17.0)
MCH: 30.5 pg (ref 26.0–34.0)
MCHC: 31.6 g/dL (ref 30.0–36.0)
MCV: 96.6 fL (ref 80.0–100.0)
Platelets: 323 10*3/uL (ref 150–400)
RBC: 2.62 MIL/uL — ABNORMAL LOW (ref 4.22–5.81)
RDW: 17.9 % — ABNORMAL HIGH (ref 11.5–15.5)
WBC: 14.7 10*3/uL — ABNORMAL HIGH (ref 4.0–10.5)
nRBC: 0 % (ref 0.0–0.2)

## 2020-04-17 LAB — RENAL FUNCTION PANEL
Albumin: 2 g/dL — ABNORMAL LOW (ref 3.5–5.0)
Anion gap: 7 (ref 5–15)
BUN: 51 mg/dL — ABNORMAL HIGH (ref 8–23)
CO2: 23 mmol/L (ref 22–32)
Calcium: 7.7 mg/dL — ABNORMAL LOW (ref 8.9–10.3)
Chloride: 107 mmol/L (ref 98–111)
Creatinine, Ser: 2.62 mg/dL — ABNORMAL HIGH (ref 0.61–1.24)
GFR, Estimated: 23 mL/min — ABNORMAL LOW (ref >60)
Glucose, Bld: 100 mg/dL — ABNORMAL HIGH (ref 70–99)
Phosphorus: 2.9 mg/dL (ref 2.5–4.6)
Potassium: 4 mmol/L (ref 3.5–5.1)
Sodium: 137 mmol/L (ref 135–145)

## 2020-04-17 LAB — MAGNESIUM: Magnesium: 2 mg/dL (ref 1.7–2.4)

## 2020-04-17 MED ORDER — BOOST / RESOURCE BREEZE PO LIQD CUSTOM
1.0000 | Freq: Three times a day (TID) | ORAL | Status: DC
  Administered 2020-04-17 – 2020-04-18 (×4): 1 via ORAL

## 2020-04-17 NOTE — Care Management Important Message (Signed)
Important Message  Patient Details  Name: Brian Wolf MRN: [id number] Date of Birth: Dec 09, 1931   Medicare Important Message Given:  Yes     Tyus Kallam P Stormie Ventola 04/17/2020, 11:18 AM

## 2020-04-17 NOTE — Progress Notes (Signed)
Physical Therapy Treatment Patient Details Name: Brian Wolf MRN: [id number] DOB: [birth year] Today's Date: 04/17/2020    History of Present Illness Pt is an 87 y/o male admitted 3/5 secondary to dark stools; thought to be secondary to GI bleed. Pt is s/p EGD on 3/6 that showed large duodenal ulcer. Since admission, pt has developed wheezing as well. Thought to be secondary to CHF and possible aspiration PNA. Pt is s/p thoracentesis on 3/8. PMH includes HTN, CVA, CKD, and OSA on CPAP.    PT Comments    Pt was able to make good progress.  He still lacks motivation , stating "let's just get this over with," but did ambulate to chair with min A.  Wife had asked for at least 15 mins notification prior to therapy to wake pt up - this seemed to help.  Wife is unable to physically assist and pt still requiring min A and unsteady  - continue to recommend SNF.  However, wife feels that pt would be happier and do better at home.  If home would need 24 hr physical assist - spoke with wife about this and she states she would hire help if needed.  They have a w/c, ramp, RW, BSC.  Wife asking for rollator so that pt could ambulate outside and take breaks if needed - not sure if pt is at this point yet, but may need rollator in future - wife aware.   Continue to progress as able.     Follow Up Recommendations  SNF;Supervision/Assistance - 24 hour (If family decides home then max HH support, aides, RN, PT, OT)     Equipment Recommendations  Other (comment) (wife requesting rollator)    Recommendations for Other Services       Precautions / Restrictions Precautions Precautions: Fall    Mobility  Bed Mobility Overal bed mobility: Needs Assistance Bed Mobility: Supine to Sit     Supine to sit: Min assist     General bed mobility comments: Min A to lift trunk    Transfers Overall transfer level: Needs assistance Equipment used: Rolling walker (2 wheeled) Transfers: Sit to/from Stand Sit  to Stand: Min assist         General transfer comment: Min A to rise and steady; cues for hand placement  Ambulation/Gait Ambulation/Gait assistance: Min assist Gait Distance (Feet): 20 Feet Assistive device: Rolling walker (2 wheeled) Gait Pattern/deviations: Step-to pattern;Decreased stride length;Wide base of support;Shuffle     General Gait Details: Cues for RW proximity and posture; min A to control walker; cues to get all the way to the chair   Stairs             Wheelchair Mobility    Modified Rankin (Stroke Patients Only)       Balance Overall balance assessment: Needs assistance Sitting-balance support: No upper extremity supported;Feet supported Sitting balance-[name]: Good     Standing balance support: Bilateral upper extremity supported;During functional activity Standing balance-[name]: Poor Standing balance comment: required RW                            Cognition Arousal/Alertness: Awake/alert Behavior During Therapy: Agitated Overall Cognitive Status: Impaired/Different from baseline Area of Impairment: Safety/judgement;Problem solving;Following commands                       Following Commands: Follows one step commands consistently Safety/Judgement: Decreased awareness of safety;Decreased awareness of deficits Awareness:  Emergent Problem Solving: Decreased initiation General Comments: Pt with decreased motivation requiring encouragment.  Stated "Let's just walk to the chair and get it over with."  Pt's wife present and trying to encourage pt      Exercises      General Comments General comments (skin integrity, edema, etc.): VSS.  Spoke with wife about pt's good progress but still needing assist.  Based on mobility only would recommend SNF to progress.  However , wife feels that pt would be in better mental place if home.  Discussed if home would need aides to assist - wife agreeable as she is unable to  physically assist.      Pertinent Vitals/Pain Pain Assessment: No/denies pain    Home Living                      Prior Function            PT Goals (current goals can now be found in the care plan section) Acute Rehab PT Goals Patient Stated Goal: wife would like to take pt home PT Goal Formulation: With patient Time For Goal Achievement: 05/01/20 Potential to Achieve Goals: Fair Progress towards PT goals: Progressing toward goals    Frequency    Min 3X/week      PT Plan Discharge plan needs to be updated;Current plan remains appropriate    Co-evaluation              AM-PAC PT "6 Clicks" Mobility   Outcome Measure  Help needed turning from your back to your side while in a flat bed without using bedrails?: A Little Help needed moving from lying on your back to sitting on the side of a flat bed without using bedrails?: A Little Help needed moving to and from a bed to a chair (including a wheelchair)?: A Little Help needed standing up from a chair using your arms (e.g., wheelchair or bedside chair)?: A Little Help needed to walk in hospital room?: A Little Help needed climbing 3-5 steps with a railing? : A Lot 6 Click Score: 17    End of Session Equipment Utilized During Treatment: Gait belt Activity Tolerance: Patient tolerated treatment well Patient left: in chair;with call bell/phone within reach;with chair alarm set;with family/visitor present Nurse Communication: Mobility status (notified assist of 1 w RW; updated whiteboard) PT Visit Diagnosis: Other abnormalities of gait and mobility (R26.89);Difficulty in walking, not elsewhere classified (R26.2);Muscle weakness (generalized) (M62.81)     Time: 1791-5056 PT Time Calculation (min) (ACUTE ONLY): 23 min  Charges:  $Gait Training: 8-22 mins $Therapeutic Activity: 8-22 mins                     Abran Richard, PT Acute Rehab Services Pager [phone] [name] [place] 04/17/2020, 4:57 PM

## 2020-04-17 NOTE — Progress Notes (Signed)
[facility] Progress Note  Patient: Brian Wolf    WHQ:[id number]  DOA: 04/05/2020     Date of Service: the patient was seen and examined on 04/17/2020  Brief hospital course: Past medical history of HTN, CVA, CKD, OSA on CPAP, sinus node dysfunction without a pacemaker.  Presents with severe anemia found to have duodenal ulcer on EGD.  Now appears to be having volume overload requiring IV diuresis.  This resulted in worsening renal function and now appears to have metabolic encephalopathy from uremia. Currently plan is monitor for improvement in renal function and mentation.  We will attempt diuresis tomorrow with albumin support.  Continue to engage with family regarding goals of care.  Assessment and Plan: 1.  Acute upper GI bleed secondary to duodenal ulcer Acute blood loss anemia Chronic active gastritis EGD 3/6 shows nonbleeding duodenal ulcer.   Verified with the GI and pathology.  H. pylori actually is negative.  No further treatment. Baseline hemoglobin around 9-10. On admission hemoglobin 6.7. SP 2 PRBC transfusion.  Currently H&H relatively stable. Reticulocyte count elevated suggesting acute anemia. Iron level low. INR normal. On PPI 40 mg daily indefinitely. monitor H&H daily. Transfuse for hemoglobin less than 7. Continue Carafate only in the hospital. Continue bowel regimen on discharge.  2.  AKI on CKD stage IV Hyperkalemia Metabolic acidosis Uremia. Baseline serum creatinine around 2. Worsening with diarrhea.  At peak serum creatinine was 3.7 and BUN was 81. For last few days serum creatinine has been improving.  BUN also started trending down. Mild worsening of mental status from baseline likely associated with his hospital stay as well as worsening uremia. Nephrology consulted. Renal ultrasound shows no obstruction. Avoid nephrotoxic medication. Initiate diuresis. Patient ideally would benefit from on Lasix 40 mg as needed for weight gain.  Other  potential regimen would be 40 mg every other day. Avoid Aldactone in the setting of hyperkalemia.  3.  Acute on chronic combined diastolic and systolic CHF Right pleural effusion Third spacing with hypoalbuminemia Echocardiogram on 3/6 EF 35 to 40%. Grade 1 diastolic dysfunction. Received IV diuresis with worsening renal function therefore currently diuresis on hold. Chest x-ray showed evidence of right-sided pleural effusion. Underwent thoracentesis with removal of 1.2 L of fluid. Fluid analysis consistent with transudative fluid. Repeat x-ray shows improvement in volume status. CXR on 3/11 shows stable pleural effusion. Monitor for reaccumulation. Seen by cardiology recommending low-dose metoprolol XL succinate 25 mg daily with hydralazine and Imdur.  Outpatient follow-up for repeat echocardiogram and further work-up if needed.  4.  Aspiration pneumonia. Proteus UTI. There was initial concern for consolidation on the right side with aspiration. Currently significant improvement in chest x-ray after removal of the fluid. Due to ongoing cough with greenish sputum will initiate antibiotic. Also urine growing Proteus.  Will continue ceftriaxone.  Total 5-day treatment course.  Last day 3/16.  5.  Atelectasis. OSA Recommended patient to remain compliant with CPAP here in the hospital.  Patient currently agreeable. Incentive spirometry highly recommended and educated with the patient. Both CPAP and incentive spirometry are not consistent given patient's lack of insight.  6.  Acute metabolic encephalopathy Undiagnosed dementia with intermittent agitation. Mild worsening per family likely in the setting of procedures, anesthesia and uremia. Monitor.  Avoid psychotropic medication. Currently does not have any insight in his medical condition. Mental status appears to be slowly improving, possibly approaching baseline Palliative care consulted.  7.  HLD. Continue statin.  8.   BPH. Continue Flomax.  9.  Constipation. Lactulose for bowel regimen.  10.  Essential hypertension. Blood pressure stable. Continue current regimen although avoiding hypotensive events. Holding Aldactone and losartan in the setting of AKI.  11.  History of CVA. On aspirin prior to admission.  Currently on hold secondary to duodenal ulcer. We will discuss with GI regarding timing of resumption.  12.  Glaucoma. Continue home eyedrops.  13.  Bilateral upper extremity edema. Doppler negative for DVT. Likely results of third spacing and hypoalbuminemia.  14  Goals of care conversation. Outpatient palliative care assistance. Prognosis remains poor due to multiple comorbidities. Discussed with patient's son who is the healthcare power of attorney on 3/14.  Recommended to pursue hospice for the patient.  Currently patient's spouse who is the primary caregiver is not on board. At present plan will be to transition to SNF if needed per PT. 3/15 discussed with patient he would rather go home in any circumstances instead of going to SNF or long-term placement. Discussed with son on the phone, currently okay with care connection program. Will ask TOC to help arrange [facility] resources on discharge Patient's wife is concerned about him going home if she will have adequate support She has requested patient be reevaluated by PT and considered for SNF since he was ambulating independently prior to admission  Diet: Cardiac diet DVT Prophylaxis:   SCDs Start: 04/05/20 1053    Advance goals of care discussion: DNR discussed in detail regarding patient's poor prognosis with multiple comorbidities. Palliative care consulted  Family Communication: Wife was present at bedside, at the time of interview.  [facility] POA documents were available for son as [facility] POA. Discussed with him on the phone. Opportunity was given to ask question and all questions were answered satisfactorily.   Disposition:  Status is:  Inpatient  Remains inpatient appropriate because:IV treatments appropriate due to intensity of illness or inability to take PO   Dispo: The patient is from: Home              Anticipated d/c is to: SNF              Patient currently is not medically stable to d/c.   Difficult to place patient No  Subjective:  Sleeping, but wakes up to voice.  Cough is less productive.  Overall shortness of breath is better.  Physical Exam:  General exam: Alert, awake, oriented x 3 Respiratory system: Clear to auscultation. Respiratory effort normal. Cardiovascular system:RRR. No murmurs, rubs, gallops. Gastrointestinal system: Abdomen is nondistended, soft and nontender. No organomegaly or masses felt. Normal bowel sounds heard. Central nervous system: Alert and oriented. No focal neurological deficits. Extremities: 1+ edema upper and lower extremities Skin: No rashes, lesions or ulcers Psychiatry: Judgement and insight appear normal. Mood & affect appropriate.     Vitals:   04/16/20 1939 04/17/20 0412 04/17/20 0755 04/17/20 1300  BP: (!) 133/51 138/60 (!) 147/61 (!) 116/53  Pulse: 75 71 68 71  Resp: 17 17    Temp: 98 F (36.7 C) 98.2 F (36.8 C) 98.2 F (36.8 C) 98.8 F (37.1 C)  TempSrc: Oral  Oral Axillary  SpO2: 95% 96% 95% 95%  Weight:      Height:       No intake or output data in the 24 hours ending 04/17/20 1901 Filed Weights   04/05/20 0641 04/15/20 0500 04/16/20 0500  Weight: 69.4 kg 71.4 kg 71.4 kg    Data Reviewed: I have personally reviewed and interpreted daily labs, tele  strips, imaging. I reviewed all nursing notes, pharmacy notes, vitals, pertinent old records I have discussed plan of care as described above with RN and patient/family.  CBC: Recent Labs  Lab 04/13/20 0738 04/14/20 0353 04/15/20 0341 04/16/20 0610 04/17/20 0237  WBC 15.9* 15.9* 15.3* 15.2* 14.7*  HGB 8.1* 7.9* 7.9* 8.0* 8.0*  HCT 25.7* 25.2* 24.9* 24.9* 25.3*  MCV 95.9 96.6 97.3 95.8 96.6   PLT 315 319 314 322 456   Basic Metabolic Panel: Recent Labs  Lab 04/13/20 0738 04/14/20 0353 04/15/20 0341 04/16/20 0610 04/17/20 0237  NA 136 136 135 137 137  K 3.8 3.6 3.8 3.9 4.0  CL 107 106 107 108 107  CO2 22 21* 20* 22 23  GLUCOSE 97 103* 100* 101* 100*  BUN 60* 57* 53* 51* 51*  CREATININE 2.83* 2.78* 2.63* 2.64* 2.62*  CALCIUM 7.7* 7.6* 7.6* 7.6* 7.7*  MG 2.1 2.1 2.0 2.0 2.0  PHOS 3.3 3.2 3.3 2.9 2.9    Studies: No results found.  Scheduled Meds: . feeding supplement  1 Container Oral TID BM  . hydrALAZINE  50 mg Oral TID  . isosorbide dinitrate  30 mg Oral TID  . lactulose  10 g Oral Daily  . latanoprost  1 drop Both Eyes QHS  . mouth rinse  15 mL Mouth Rinse BID  . metoprolol succinate  25 mg Oral Daily  . multivitamin with minerals  1 tablet Oral Daily  . pantoprazole  40 mg Oral BID AC  . saccharomyces boulardii  250 mg Oral BID  . simvastatin  40 mg Oral Daily  . sodium chloride flush  3 mL Intravenous Q12H  . sucralfate  1 g Oral TID WC & HS  . tamsulosin  0.4 mg Oral Daily   Continuous Infusions:  PRN Meds: ipratropium-albuterol, loratadine  Time spent: 35 minutes  Author: Kathie Dike, MD [facility] 04/17/2020 7:01 PM  To reach On-call, see care teams to locate the attending and reach out via [email or web address]. Between 7PM-7AM, please contact night-coverage If you still have difficulty reaching the attending provider, please page the [facility] (Director on Call) for [facility] on amion for assistance.

## 2020-04-18 LAB — RENAL FUNCTION PANEL
Albumin: 1.9 g/dL — ABNORMAL LOW (ref 3.5–5.0)
Anion gap: 9 (ref 5–15)
BUN: 50 mg/dL — ABNORMAL HIGH (ref 8–23)
CO2: 21 mmol/L — ABNORMAL LOW (ref 22–32)
Calcium: 7.7 mg/dL — ABNORMAL LOW (ref 8.9–10.3)
Chloride: 106 mmol/L (ref 98–111)
Creatinine, Ser: 2.54 mg/dL — ABNORMAL HIGH (ref 0.61–1.24)
GFR, Estimated: 24 mL/min — ABNORMAL LOW (ref >60)
Glucose, Bld: 104 mg/dL — ABNORMAL HIGH (ref 70–99)
Phosphorus: 3.2 mg/dL (ref 2.5–4.6)
Potassium: 4.1 mmol/L (ref 3.5–5.1)
Sodium: 136 mmol/L (ref 135–145)

## 2020-04-18 LAB — MAGNESIUM: Magnesium: 2.2 mg/dL (ref 1.7–2.4)

## 2020-04-18 MED ORDER — ASPIRIN 81 MG PO TBEC: 81.0000 mg | DELAYED_RELEASE_TABLET | Freq: Every day | ORAL | 12 refills | Status: DC

## 2020-04-18 MED ORDER — LACTULOSE 10 GM/15ML PO SOLN: 10.0000 g | Freq: Every day | ORAL | 0 refills | Status: DC | PRN

## 2020-04-18 MED ORDER — METOPROLOL SUCCINATE ER 25 MG PO TB24: 25.0000 mg | ORAL_TABLET | Freq: Every day | ORAL | 0 refills | Status: DC

## 2020-04-18 MED ORDER — FUROSEMIDE 20 MG PO TABS: 20.0000 mg | ORAL_TABLET | ORAL | 0 refills | Status: DC

## 2020-04-18 NOTE — Plan of Care (Signed)
  Problem: Education: Goal: Knowledge of General Education information will improve Description: Including pain rating scale, medication(s)/side effects and non-pharmacologic comfort measures Outcome: Progressing   Problem: Health Behavior/Discharge Planning: Goal: Ability to manage health-related needs will improve Outcome: Progressing   Problem: Coping: Goal: Level of anxiety will decrease Outcome: Progressing   Problem: Elimination: Goal: Will not experience complications related to bowel motility Outcome: Progressing   Problem: Skin Integrity: Goal: Risk for impaired skin integrity will decrease Outcome: Progressing

## 2020-04-18 NOTE — Discharge Summary (Addendum)
Physician Discharge Summary  Brian Wolf EXN:[id number] DOB: Mar 07, 1931 DOA: 04/05/2020  PCP: [name], DO  Admit date: 04/05/2020 Discharge date: 04/18/2020  Admitted From: Home Disposition: Home  Recommendations for Outpatient Follow-up:  Follow up with PCP in 1-2 weeks Please obtain BMP/CBC in one week [facility] will arrange for outpatient follow-up and consider follow-up endoscopy to confirm ulcer healing Follow-up with Dr. Einar Gip for further management of CHF  Home Health: Home health PT, OT Equipment/Devices: Rollator  Discharge Condition: Stable CODE STATUS: DNR Diet recommendation: Heart healthy  Brief/Interim Summary: 87 year old male with a history of hypertension, prior stroke, sinus node dysfunction with her pacemaker, presents with GI bleeding related to duodenal ulcer and associated anemia.  He was seen by gastroenterology and underwent EGD.  Overall GI bleeding stabilized.  Echocardiogram was checked and noted to have low ejection fraction and evidence of volume overload.  He was seen by cardiology.  He underwent thoracentesis with removal of 1.2 L of fluid.  Hospital course further complicated by development of aspiration pneumonia.  He has been adequately treated with antibiotics.  Overall condition has improved.  He is working with physical therapy.  Discharge Diagnoses:  Principal Problem:   Acute upper GI bleed Active Problems:   Essential hypertension   History of stroke   Sleep apnea   Acute kidney injury superimposed on chronic kidney disease (HCC)   Bilateral lower extremity edema   Acute blood loss anemia   Pleural effusion   Acute systolic CHF (congestive heart failure) (HCC)   SOB (shortness of breath)   Malnutrition of moderate degree  1.  Acute upper GI bleed secondary to duodenal ulcer Acute blood loss anemia Chronic active gastritis EGD 3/6 shows nonbleeding duodenal ulcer.   H. pylori actually is negative.  No further  treatment. Baseline hemoglobin around 9-10. On admission hemoglobin 6.7. SP 2 PRBC transfusion.  Currently H&H relatively stable at 8.0. Reticulocyte count elevated suggesting acute anemia. Iron level low. INR normal. On PPI 40 mg daily indefinitely. Transfuse for hemoglobin less than 7. Continue Carafate only in the hospital. Continue bowel regimen on discharge. Follow-up with gastroenterology to be considered for repeat endoscopy for surveillance   2.  AKI on CKD stage IV with ATN Hyperkalemia Metabolic acidosis Uremia. Baseline serum creatinine around 2. Worsening with diarrhea.  At peak serum creatinine was 3.7 and BUN was 81. For last few days serum creatinine has been improving.  BUN also started trending down. Mild worsening of mental status from baseline likely associated with his hospital stay as well as worsening uremia. Nephrology consulted. Renal ultrasound shows no obstruction. Avoid nephrotoxic medication. Overall creatinine has stabilized at 2.5   3.  Acute on chronic combined diastolic and systolic CHF Right pleural effusion Third spacing with hypoalbuminemia Nonischemic cardiomyopathy Echocardiogram on 3/6 EF 35 to 40%. Grade 1 diastolic dysfunction. Received IV diuresis with worsening renal function therefore currently diuresis on hold. Chest x-ray showed evidence of right-sided pleural effusion. Underwent thoracentesis with removal of 1.2 L of fluid. Fluid analysis consistent with transudative fluid. Repeat x-ray shows improvement in volume status. CXR on 3/11 shows stable pleural effusion. Monitor for reaccumulation. Seen by cardiology recommending low-dose metoprolol XL succinate 25 mg daily with hydralazine and Imdur.  Outpatient follow-up for repeat echocardiogram and further work-up if needed. Continued on low-dose Lasix as an outpatient since he is hypoalbuminemic and will likely develop intravascular volume depletion rather quickly   4.  Aspiration  pneumonia. Proteus UTI. There was initial concern  for consolidation on the right side with aspiration. Currently significant improvement in chest x-ray after removal of the fluid. Due to ongoing cough with greenish sputum he was treated with antibiotics Also urine growing Proteus.    He completed a 5-day course of ceftriaxone.  Last day 3/16.   5.  Atelectasis. OSA Continue CPAP as an outpatient as he will tolerate.   6.  Acute metabolic encephalopathy Undiagnosed dementia with intermittent agitation. Mild worsening per family likely in the setting of procedures, anesthesia and uremia. Monitor.  Avoid psychotropic medication. Currently does not have any insight in his medical condition. Mental status appears to be slowly improving, possibly approaching baseline Palliative care consulted.  Family was not prepared to further discuss hospice   7.  HLD. Continue statin.   8.  BPH. Continue Flomax.   9.  Constipation. Lactulose for bowel regimen.   10.  Essential hypertension. Blood pressure stable. Continue current regimen although avoiding hypotensive events. Holding Aldactone and losartan in the setting of AKI.   11.  History of CVA. On aspirin prior to admission.  Currently on hold secondary to duodenal ulcer. We will recommend resuming aspirin after at least 1 week   12.  Glaucoma. Continue home eyedrops.   13.  Bilateral upper extremity edema. Doppler negative for DVT. Likely results of third spacing and hypoalbuminemia.   14  Goals of care conversation. Outpatient palliative care assistance. Prognosis remains poor due to multiple comorbidities. Several discussions were had with patient's wife as well as his son, Dr. Geryl Councilman, who is healthcare power of attorney around goals of care At this point, patient's wife does not wish to consider hospice He was evaluated by physical therapy with initial recommendations for skilled nursing facility After a few sessions with  physical therapy, he had improved to the point where they could recommend home health PT Patient's wife does not wish for him to go to skilled nursing facility anyways She requested that he be discharged home with home health services Home health services arranged prior to discharge.  Patient's wife reports that she has adequate DME at home including wheelchair, bedside commode, shower chair, walker She reports that she will have friends assisting in his care At discharge, patient was able to ambulate at least 50 feet with minimum assistance and was using a walker.  Discharge Instructions  Discharge Instructions     Diet - low sodium heart healthy   Complete by: As directed    Increase activity slowly   Complete by: As directed       Allergies as of 04/18/2020   No Known Allergies      Medication List     STOP taking these medications    hydrochlorothiazide 25 MG tablet Commonly known as: HYDRODIURIL   losartan 100 MG tablet Commonly known as: COZAAR   metoprolol tartrate 50 MG tablet Commonly known as: LOPRESSOR   NIFEdipine 60 MG 24 hr tablet Commonly known as: Procardia XL   spironolactone 25 MG tablet Commonly known as: ALDACTONE       TAKE these medications    aspirin 81 MG EC tablet Take 1 tablet (81 mg total) by mouth daily. Resume on 3/25 What changed: additional instructions   fexofenadine 30 MG tablet Commonly known as: ALLEGRA Take 30 mg by mouth daily as needed (allergies).   furosemide 20 MG tablet Commonly known as: LASIX Take 1 tablet (20 mg total) by mouth every other day. What changed: when to take this   hydrALAZINE  50 MG tablet Commonly known as: APRESOLINE Take 1 tablet (50 mg total) by mouth 3 (three) times daily.   isosorbide dinitrate 30 MG tablet Commonly known as: ISORDIL Take 1 tablet (30 mg total) by mouth 3 (three) times daily.   lactulose 10 GM/15ML solution Commonly known as: CHRONULAC Take 15 mLs (10 g total) by  mouth daily as needed for mild constipation.   metoprolol succinate 25 MG 24 hr tablet Commonly known as: TOPROL-XL Take 1 tablet (25 mg total) by mouth daily. Start taking on: April 19, 2020   pantoprazole 40 MG tablet Commonly known as: PROTONIX Take 1 tablet (40 mg total) by mouth 2 (two) times daily before a meal.   PROBIOTIC DAILY PO Take 1 capsule by mouth daily.   simvastatin 40 MG tablet Commonly known as: ZOCOR Take 40 mg by mouth daily.   tamsulosin 0.4 MG Caps capsule Commonly known as: FLOMAX Take 0.4 mg by mouth daily. 30 minutes after each meal   Travoprost (BAK Free) 0.004 % Soln ophthalmic solution Commonly known as: TRAVATAN Place 1 drop into both eyes at bedtime.               Durable Medical Equipment  (From admission, onward)           Start     Ordered   04/18/20 1526  For home use only DME 4 wheeled rolling walker with seat  Once       Question:  Patient needs a walker to treat with the following condition  Answer:  Weakness   04/18/20 1526   04/07/20 1029  For home use only DME 4 wheeled rolling walker with seat  Once       Question:  Patient needs a walker to treat with the following condition  Answer:  Gait instability   04/07/20 1028            Follow-up Information     [facility] Follow up.   Specialty: [place] Why: home health services will be provided by [facility], start of care within 48 hours post discharge Contact information: [place] [address] [phone]         [name], DO. Schedule an appointment as soon as possible for a visit on 04/21/2020.   Specialty: Family Medicine Why: Please arrange post hospital follow up appointment with PCP Contact information: [address] [place] [address] [phone]                No Known Allergies  Consultations: Gastroenterology Palliative  care Cardiology Nephrology   Procedures/Studies: DG Chest 1 View  Result Date: 04/08/2020 CLINICAL DATA:  Post right-sided thoracentesis EXAM: CHEST  1 VIEW COMPARISON:  04/07/2020; 04/06/2020; 04/05/2020 FINDINGS: Grossly unchanged enlarged cardiac silhouette and mediastinal contours with atherosclerotic plaque thoracic aorta. There is persistent thickening the right paratracheal stripe presumably secondary prominent vasculature. Interval reduction in persistent small right-sided effusion post thoracentesis. No pneumothorax. Improved aeration the right lung base with persistent right basilar heterogeneous/consolidative opacities. Unchanged trace left-sided effusion associated left basilar heterogeneous opacities. Pulmonary vasculature remains indistinct with cephalization of flow. No acute osseous abnormalities. IMPRESSION: 1. Interval reduction in persistent small right-sided effusion post thoracentesis. No pneumothorax. 2. Improved aeration the right lung base with persistent bibasilar opacities, atelectasis versus infiltrate. 3. Otherwise, similar findings of pulmonary edema and trace left-sided pleural effusion. Electronically Signed   By: Sandi Mariscal M.D.   On: 04/08/2020 09:50  US RENAL  Result Date: 04/08/2020 CLINICAL DATA:  Acute renal injury EXAM: RENAL / URINARY TRACT ULTRASOUND COMPLETE COMPARISON:  None. FINDINGS: Right Kidney: Renal measurements: 11.9 x 5.6 x 6.6 cm. = volume: 230 mL. Increased echogenicity is noted with thinning of the cortex. Cysts are seen within the kidney the largest of which measures 4.2 cm in the lower pole of the right kidney. No hydronephrosis is noted. Left Kidney: Renal measurements: 10.7 x 5.2 x 5.6 cm. = volume: 161 mL. Cysts are noted within the left kidney is well the largest of which lies in the midportion measuring 2.3 cm. Increased echogenicity and cortical thinning is noted similar to that seen on the right. Bladder: Decompressed Other: Small right  pleural effusion. IMPRESSION: Bilateral renal cysts and changes consistent with medical renal disease. Small right pleural effusion. Electronically Signed   By: Inez Catalina M.D.   On: 04/08/2020 17:15   DG CHEST PORT 1 VIEW  Result Date: 04/11/2020 CLINICAL DATA:  Cough.  Pleural effusion. EXAM: PORTABLE CHEST 1 VIEW COMPARISON:  April 08, 2020 FINDINGS: Pleural effusion on each side, essentially stable compared to recent study. Bibasilar atelectasis noted. There is cardiomegaly with pulmonary vascularity normal. No adenopathy. There is aortic atherosclerosis. No bone lesions. IMPRESSION: Essentially stable pleural effusions bilaterally with bibasilar atelectasis. Stable cardiac enlargement. No new opacity evident. Aortic Atherosclerosis (ICD10-I70.0). Electronically Signed   By: Lowella Grip III M.D.   On: 04/11/2020 09:48   DG Chest Port 1 View  Result Date: 04/07/2020 CLINICAL DATA:  Follow-up effusions and atelectasis versus pneumonia. EXAM: PORTABLE CHEST 1 VIEW COMPARISON:  04/06/2020 and earlier. FINDINGS: Cardiac silhouette mildly enlarged, unchanged. Pulmonary vascularity normal without evidence of pulmonary edema. Large RIGHT pleural effusion with associated dense consolidation in the RIGHT MIDDLE LOBE and RIGHT LOWER LOBE, unchanged. Small LEFT pleural effusion with associated consolidation in the LEFT LOWER LOBE, unchanged. No new abnormalities. IMPRESSION: 1. Stable large RIGHT pleural effusion and associated dense atelectasis and/or pneumonia in the RIGHT MIDDLE LOBE and RIGHT LOWER LOBE. 2. Stable small LEFT pleural effusion and associated passive atelectasis and/or pneumonia in the LEFT LOWER LOBE. 3. No new abnormalities. Electronically Signed   By: Evangeline Dakin M.D.   On: 04/07/2020 16:43   DG CHEST PORT 1 VIEW  Result Date: 04/06/2020 CLINICAL DATA:  Dyspnea EXAM: PORTABLE CHEST 1 VIEW COMPARISON:  04/05/2020 FINDINGS: Large right pleural effusion is unchanged with compressive  atelectasis of the right lung base and opacification of the right hemithorax. Small left pleural effusion is present with persistent retrocardiac atelectasis. Right paratracheal opacity is unchanged from multiple prior examinations likely representing vascular shadow. Cardiac size within normal limits. The pulmonary vascularity is normal. No acute bone abnormality. Multiple healed right rib fractures are noted. IMPRESSION: Stable large right and small left pleural effusions with associated bibasilar compressive atelectasis. Electronically Signed   By: Fidela Salisbury MD   On: 04/06/2020 23:04   DG CHEST PORT 1 VIEW  Result Date: 04/05/2020 CLINICAL DATA:  Shortness of breath EXAM: PORTABLE CHEST 1 VIEW COMPARISON:  03/11/2016 chest radiograph FINDINGS: Increased density overlying the RIGHT hemithorax likely represents a pleural effusion. Cardiomediastinal silhouette is unchanged with fullness in the UPPER RIGHT mediastinum. Pulmonary vascular congestion is noted. There may be bilateral LOWER lung opacities/atelectasis present. There is no evidence of pneumothorax. Remote RIGHT rib fractures are present. IMPRESSION: 1. Increased density overlying the RIGHT hemithorax likely representing pleural effusion. 2. Pulmonary vascular congestion and possible bilateral LOWER lung opacities/atelectasis. Electronically Signed  By: Margarette Canada M.D.   On: 04/05/2020 11:29   DG Swallowing Func-Speech Pathology  Result Date: 04/17/2020 Objective Swallowing Evaluation: Type of Study: MBS-Modified Barium Swallow Study  Patient Details Name: Brian Wolf MRN: [id number] Date of Birth: 12-Nov-1931 Today's Date: 04/17/2020 Time: SLP Start Time (ACUTE ONLY): 1120 -SLP Stop Time (ACUTE ONLY): 1130 SLP Time Calculation (min) (ACUTE ONLY): 10 min Past Medical History: Past Medical History: Diagnosis Date  CVA (cerebral infarction) 12/2013  Dementia ([place])   Gastric ulcer   prior to 2000  Hypertension   Sleep difficulties  Past  Surgical History: Past Surgical History: Procedure Laterality Date  ABDOMINAL SURGERY    BIOPSY  04/06/2020  Procedure: BIOPSY;  Surgeon: Ronald Lobo, MD;  Location: [place] [place];  Service: Endoscopy;;  CATARACT EXTRACTION Left   ESOPHAGOGASTRODUODENOSCOPY N/A 10/18/2019  Procedure: ESOPHAGOGASTRODUODENOSCOPY (EGD);  Surgeon: Wonda Horner, MD;  [facility]COPY;  Service: Endoscopy;  Laterality: N/A;  ESOPHAGOGASTRODUODENOSCOPY (EGD) WITH PROPOFOL N/A 04/06/2020  Procedure: ESOPHAGOGASTRODUODENOSCOPY (EGD) WITH PROPOFOL;  Surgeon: Ronald Lobo, MD;  Location: [facility] [place];  Service: Endoscopy;  Laterality: N/A;  IR THORACENTESIS ASP PLEURAL SPACE W/IMG GUIDE  04/08/2020 HPI: Pt is an 87 year old male who presented with recurrent ulcer disease in the absence of ulcerogenic medication exposure. Pt admitted with melena and anemia. EGD showed clean-based duodenal ulcer which is the presumed source of the patient's recent bleeding. Pt is on IV protonix. CXR showed  relatively stable cardiomegaly with right-sided pleural effusion with pulmonary vascular congestion and possible bibasilar atelectasis. Pt underwent and has improved significantly with antibiotics. Cardiologist writes "Chest x-ray has also demonstrated significant improvement and BNP has trended down since yesterday.  Patient appears euvolemic on exam.  Agree that patient's dyspnea likely primarily driven by aspiration pneumonia with pneumonitis and reactive airway disease, particularly in view of responsiveness to antibiotics and present pulmonary medications."  Wife denies observations of esophageal or pharyngeal dysphagia but reports his appetite has been poor. Only improved today. Pt has been been evalauted clinically by SLP in prior admissions, never found to have dysphagia.  No data recorded Assessment / Plan / Recommendation CHL IP CLINICAL IMPRESSIONS 04/11/2020 Clinical Impression  Pt demonstrates mild oropharyngeal dysphagia with primary  problem being instances of sensed and silent aspiration because the swallow with thin liquids. Pt piecemeals most bolus with premature spillage and slight delay in swallow initiation. A Nieman tuck allows bolus to be captured in the vallculae protecting airway from spillage. There is also mild vallcular residue with solids though this clear with a liquid wash. Recommend regular diet and thin liquids with a Tardiff tuck as pt was able to follow this strategy well today. Nectar thick liquids are also effective, but should only be utilized in times of decreased ability to follow Savage tuck (acute illness/increasing cognitive decline). SLP Visit Diagnosis Dysphagia, oropharyngeal phase (R13.12) Attention and concentration deficit following -- Frontal lobe and executive function deficit following -- Impact on safety and function --   CHL IP TREATMENT RECOMMENDATION 04/10/2020 Treatment Recommendations Therapy as outlined in treatment plan below   Prognosis 04/10/2020 Prognosis for Safe Diet Advancement Good Barriers to Reach Goals -- Barriers/Prognosis Comment -- CHL IP DIET RECOMMENDATION 04/16/2020 SLP Diet Recommendations -- Liquid Administration via -- Medication Administration -- Compensations Slow rate;Small sips/bites;Tregre tuck Postural Changes --   CHL IP OTHER RECOMMENDATIONS 04/10/2020 Recommended Consults -- Oral Care Recommendations Oral care BID Other Recommendations --   CHL IP FOLLOW UP RECOMMENDATIONS 04/16/2020 Follow up Recommendations Home  health SLP   CHL IP FREQUENCY AND DURATION 04/10/2020 Speech Therapy Frequency (ACUTE ONLY) min 2x/week Treatment Duration 2 weeks      CHL IP ORAL PHASE 04/10/2020 Oral Phase Impaired Oral - Pudding Teaspoon -- Oral - Pudding Cup -- Oral - Honey Teaspoon -- Oral - Honey Cup -- Oral - Nectar Teaspoon -- Oral - Nectar Cup -- Oral - Nectar Straw Piecemeal swallowing;Lingual/palatal residue;Delayed oral transit;Premature spillage;Decreased bolus cohesion Oral - Thin Teaspoon -- Oral  - Thin Cup Piecemeal swallowing;Lingual/palatal residue;Delayed oral transit;Premature spillage;Decreased bolus cohesion Oral - Thin Straw Piecemeal swallowing;Lingual/palatal residue;Delayed oral transit;Premature spillage;Decreased bolus cohesion Oral - Puree Piecemeal swallowing;Lingual/palatal residue;Delayed oral transit;Premature spillage;Decreased bolus cohesion Oral - Mech Soft -- Oral - Regular -- Oral - Multi-Consistency -- Oral - Pill -- Oral Phase - Comment --  CHL IP PHARYNGEAL PHASE 04/10/2020 Pharyngeal Phase Impaired Pharyngeal- Pudding Teaspoon -- Pharyngeal -- Pharyngeal- Pudding Cup -- Pharyngeal -- Pharyngeal- Honey Teaspoon -- Pharyngeal -- Pharyngeal- Honey Cup -- Pharyngeal -- Pharyngeal- Nectar Teaspoon -- Pharyngeal -- Pharyngeal- Nectar Cup -- Pharyngeal -- Pharyngeal- Nectar Straw Delayed swallow initiation-vallecula;Pharyngeal residue - valleculae Pharyngeal -- Pharyngeal- Thin Teaspoon -- Pharyngeal -- Pharyngeal- Thin Cup Delayed swallow initiation-pyriform sinuses;Delayed swallow initiation-vallecula;Penetration/Aspiration before swallow;Trace aspiration;Pharyngeal residue - valleculae Pharyngeal Material enters airway, passes BELOW cords without attempt by patient to eject out (silent aspiration);Material enters airway, remains ABOVE vocal cords and not ejected out;Material enters airway, CONTACTS cords and not ejected out;Material enters airway, passes BELOW cords and not ejected out despite cough attempt by patient Pharyngeal- Thin Straw Delayed swallow initiation-pyriform sinuses;Delayed swallow initiation-vallecula;Penetration/Aspiration before swallow;Trace aspiration;Pharyngeal residue - valleculae Pharyngeal -- Pharyngeal- Puree Pharyngeal residue - valleculae;Reduced tongue base retraction Pharyngeal -- Pharyngeal- Mechanical Soft Reduced tongue base retraction;Pharyngeal residue - valleculae Pharyngeal -- Pharyngeal- Regular -- Pharyngeal -- Pharyngeal- Multi-consistency --  Pharyngeal -- Pharyngeal- Pill Reduced tongue base retraction;Pharyngeal residue - valleculae Pharyngeal -- Pharyngeal Comment --  No flowsheet data found. DeBlois, Katherene Ponto 04/17/2020, 7:42 AM              ECHOCARDIOGRAM COMPLETE  Result Date: 04/06/2020    ECHOCARDIOGRAM REPORT   Patient Name:   Brian Wolf Date of Exam: 04/06/2020 Medical Rec #:  [id number]         Height:       65.0 in Accession #:    [id number]        Weight:       153.0 lb Date of Birth:  24-Apr-1931         BSA:          1.765 m Patient Age:    85 years          BP:           119/62 mmHg Patient Gender: M                 HR:           84 bpm. Exam Location:  Inpatient Procedure: 2D Echo, Cardiac Doppler and Color Doppler Indications:    I50.23 Acute on chronic systolic (congestive) heart failure  History:        Patient has prior history of Echocardiogram examinations, most                 recent 03/09/2016. Stroke; Risk Factors:Hypertension.  Sonographer:    Jonelle Sidle Dance Referring Phys: [place]  1. Left ventricular ejection fraction, by estimation, is 35 to 40%. The left ventricle has moderately decreased  function. The left ventricle has no regional wall motion abnormalities. Left ventricular diastolic parameters are consistent with Grade I diastolic dysfunction (impaired relaxation).  2. Right ventricular systolic function is normal. The right ventricular size is normal. There is normal pulmonary artery systolic pressure.  3. The mitral valve is normal in structure. No evidence of mitral valve regurgitation. No evidence of mitral stenosis.  4. The aortic valve is tricuspid. Aortic valve regurgitation is mild. Mild to moderate aortic valve sclerosis/calcification is present, without any evidence of aortic stenosis.  5. The inferior vena cava is normal in size with greater than 50% respiratory variability, suggesting right atrial pressure of 3 mmHg. FINDINGS  Left Ventricle: Left ventricular ejection  fraction, by estimation, is 35 to 40%. The left ventricle has moderately decreased function. The left ventricle has no regional wall motion abnormalities. The left ventricular internal cavity size was normal in size. There is no left ventricular hypertrophy. Left ventricular diastolic parameters are consistent with Grade I diastolic dysfunction (impaired relaxation).  LV Wall Scoring: The mid and distal anterior septum, apical lateral segment, mid inferoseptal segment, and apex are akinetic. Right Ventricle: The right ventricular size is normal. No increase in right ventricular wall thickness. Right ventricular systolic function is normal. There is normal pulmonary artery systolic pressure. The tricuspid regurgitant velocity is 2.42 m/s, and  with an assumed right atrial pressure of 3 mmHg, the estimated right ventricular systolic pressure is 79.8 mmHg. Left Atrium: Left atrial size was normal in size. Right Atrium: Right atrial size was normal in size. Pericardium: There is no evidence of pericardial effusion. Mitral Valve: The mitral valve is normal in structure. No evidence of mitral valve regurgitation. No evidence of mitral valve stenosis. Tricuspid Valve: The tricuspid valve is normal in structure. Tricuspid valve regurgitation is not demonstrated. No evidence of tricuspid stenosis. Aortic Valve: The aortic valve is tricuspid. Aortic valve regurgitation is mild. Aortic regurgitation PHT measures 440 msec. Mild to moderate aortic valve sclerosis/calcification is present, without any evidence of aortic stenosis. Pulmonic Valve: The pulmonic valve was normal in structure. Pulmonic valve regurgitation is not visualized. No evidence of pulmonic stenosis. Aorta: The aortic root is normal in size and structure. Venous: The inferior vena cava is normal in size with greater than 50% respiratory variability, suggesting right atrial pressure of 3 mmHg. IAS/Shunts: No atrial level shunt detected by color flow Doppler.   LEFT VENTRICLE PLAX 2D LVIDd:         4.50 cm LVIDs:         3.60 cm LV PW:         1.10 cm LV IVS:        1.10 cm LVOT diam:     2.10 cm LV SV:         72 LV SV Index:   41 LVOT Area:     3.46 cm  RIGHT VENTRICLE          IVC RV Basal diam:  2.80 cm  IVC diam: 1.40 cm TAPSE (M-mode): 1.6 cm LEFT ATRIUM             Index       RIGHT ATRIUM           Index LA diam:        4.50 cm 2.55 cm/m  RA Area:     11.50 cm LA Vol (A2C):   80.8 ml 45.77 ml/m RA Volume:   26.90 ml  15.24 ml/m LA Vol (A4C):  86.1 ml 48.77 ml/m LA Biplane Vol: 83.4 ml 47.24 ml/m  AORTIC VALVE LVOT Vmax:   95.00 cm/s LVOT Vmean:  60.300 cm/s LVOT VTI:    0.209 m AI PHT:      440 msec  AORTA Ao Root diam: 3.30 cm Ao Asc diam:  3.50 cm MV A velocity: 128.00 cm/s  TRICUSPID VALVE                             TR Peak grad:   23.4 mmHg                             TR Vmax:        242.00 cm/s                              SHUNTS                             Systemic VTI:  0.21 m                             Systemic Diam: 2.10 cm Candee Furbish MD Electronically signed by Candee Furbish MD Signature Date/Time: 04/06/2020/3:01:25 PM    Final    VAS [name] UPPER EXTREMITY VENOUS DUPLEX  Result Date: 04/15/2020 UPPER VENOUS STUDY  Indications: Edema Comparison Study: No prior studies. Performing Technologist: Rogelia Rohrer RVT, RDMS  Examination Guidelines: A complete evaluation includes B-mode imaging, spectral Doppler, color Doppler, and power Doppler as needed of all accessible portions of each vessel. Bilateral testing is considered an integral part of a complete examination. Limited examinations for reoccurring indications may be performed as noted.  Right Findings: +----------+------------+---------+-----------+----------+-------+ RIGHT     CompressiblePhasicitySpontaneousPropertiesSummary +----------+------------+---------+-----------+----------+-------+ IJV           Full       Yes       Yes                       +----------+------------+---------+-----------+----------+-------+ Subclavian    Full       Yes       Yes                      +----------+------------+---------+-----------+----------+-------+ Axillary      Full       Yes       Yes                      +----------+------------+---------+-----------+----------+-------+ Brachial      Full       Yes       Yes                      +----------+------------+---------+-----------+----------+-------+ Radial        Full                                          +----------+------------+---------+-----------+----------+-------+ Ulnar         Full                                          +----------+------------+---------+-----------+----------+-------+  Cephalic      Full                                          +----------+------------+---------+-----------+----------+-------+ Basilic       Full                                          +----------+------------+---------+-----------+----------+-------+  Left Findings: +----------+------------+---------+-----------+----------+-------+ LEFT      CompressiblePhasicitySpontaneousPropertiesSummary +----------+------------+---------+-----------+----------+-------+ IJV           Full       Yes       Yes                      +----------+------------+---------+-----------+----------+-------+ Subclavian    Full       Yes       Yes                      +----------+------------+---------+-----------+----------+-------+ Axillary      Full       Yes       Yes                      +----------+------------+---------+-----------+----------+-------+ Brachial      Full       Yes       Yes                      +----------+------------+---------+-----------+----------+-------+ Radial        Full                                          +----------+------------+---------+-----------+----------+-------+ Ulnar         Full                                           +----------+------------+---------+-----------+----------+-------+ Cephalic      Full                                          +----------+------------+---------+-----------+----------+-------+ Basilic       Full                                          +----------+------------+---------+-----------+----------+-------+  Summary:  Right: No evidence of deep vein thrombosis in the upper extremity. No evidence of superficial vein thrombosis in the upper extremity.  Left: No evidence of deep vein thrombosis in the upper extremity. No evidence of superficial vein thrombosis in the upper extremity.  *See table(s) above for measurements and observations.  Diagnosing physician: Deitra Mayo MD Electronically signed by Deitra Mayo MD on 04/15/2020 at 12:35:46 PM.    Final    IR THORACENTESIS ASP PLEURAL SPACE W/IMG GUIDE  Result Date: 04/08/2020 INDICATION: Shortness of breath. Large right pleural effusion. Request for diagnostic and therapeutic thoracentesis. EXAM: ULTRASOUND GUIDED RIGHT THORACENTESIS MEDICATIONS: 1% plain  lidocaine, 5 mL COMPLICATIONS: None immediate. PROCEDURE: An ultrasound guided thoracentesis was thoroughly discussed with the patient and questions answered. The benefits, risks, alternatives and complications were also discussed. The patient understands and wishes to proceed with the procedure. Written consent was obtained. Ultrasound was performed to localize and mark an adequate pocket of fluid in the right chest. The area was then prepped and draped in the normal sterile fashion. 1% Lidocaine was used for local anesthesia. Under ultrasound guidance a 6 Fr Safe-T-Centesis catheter was introduced. Thoracentesis was performed. The catheter was removed and a dressing applied. FINDINGS: A total of approximately 1.25 L of clear yellow fluid was removed. Samples were sent to the laboratory as requested by the clinical team. IMPRESSION: Successful ultrasound guided  right thoracentesis yielding 1.25 L of pleural fluid. Read by: Ascencion Dike PA-C Electronically Signed   By: Sandi Mariscal M.D.   On: 04/08/2020 09:48      Subjective: He reports feeling well today.  Cough is less productive.  Breathing appears to be improving.  Working with physical therapy.  P.o. intake appears to be improving  Discharge Exam: Vitals:   04/17/20 1943 04/18/20 0353 04/18/20 0755 04/18/20 1458  BP: (!) 153/64 (!) 139/56 (!) 146/73 140/61  Pulse: 67 68 69 69  Resp: 16 18 18 17   Temp: 98.3 F (36.8 C) 98.3 F (36.8 C) 98 F (36.7 C) 98.3 F (36.8 C)  TempSrc:   Oral Oral  SpO2: 96% 95% 94% 97%  Weight:      Height:        General: Pt is alert, awake, not in acute distress Cardiovascular: RRR, S1/S2 +, no rubs, no gallops Respiratory: CTA bilaterally, no wheezing, no rhonchi Abdominal: Soft, NT, ND, bowel sounds + Extremities: 1+ edema, no cyanosis    The results of significant diagnostics from this hospitalization (including imaging, microbiology, ancillary and laboratory) are listed below for reference.     Microbiology: Recent Results (from the past 240 hour(s))  Culture, Urine     Status: Abnormal   Collection Time: 04/09/20  4:04 PM   Specimen: Urine, Clean Catch  Result Value Ref Range Status   Specimen Description URINE, CLEAN CATCH  Final   Special Requests   Final    NONE Performed at [facility], [address] [place], [place] [address]    Culture >=100,000 COLONIES/mL PROTEUS MIRABILIS (A)  Final   Report Status 04/12/2020 FINAL  Final   Organism ID, Bacteria PROTEUS MIRABILIS (A)  Final      Susceptibility   Proteus mirabilis - MIC*    AMPICILLIN >=32 RESISTANT Resistant     CEFAZOLIN 8 SENSITIVE Sensitive     CEFEPIME <=0.12 SENSITIVE Sensitive     CEFTRIAXONE <=0.25 SENSITIVE Sensitive     CIPROFLOXACIN <=0.25 SENSITIVE Sensitive     GENTAMICIN >=16 RESISTANT Resistant     IMIPENEM 2 SENSITIVE Sensitive     NITROFURANTOIN  128 RESISTANT Resistant     TRIMETH/SULFA <=20 SENSITIVE Sensitive     AMPICILLIN/SULBACTAM >=32 RESISTANT Resistant     PIP/TAZO <=4 SENSITIVE Sensitive     * >=100,000 COLONIES/mL PROTEUS MIRABILIS     Labs: BNP (last 3 results) Recent Labs    04/08/20 0144 04/09/20 0251 04/10/20 0348  BNP 1,426.1* 1,085.0* 784.6*   Basic Metabolic Panel: Recent Labs  Lab 04/14/20 0353 04/15/20 0341 04/16/20 0610 04/17/20 0237 04/18/20 0407  NA 136 135 137 137 136  K 3.6 3.8 3.9 4.0 4.1  CL  106 107 108 107 106  CO2 21* 20* 22 23 21*  GLUCOSE 103* 100* 101* 100* 104*  BUN 57* 53* 51* 51* 50*  CREATININE 2.78* 2.63* 2.64* 2.62* 2.54*  CALCIUM 7.6* 7.6* 7.6* 7.7* 7.7*  MG 2.1 2.0 2.0 2.0 2.2  PHOS 3.2 3.3 2.9 2.9 3.2   Liver Function Tests: Recent Labs  Lab 04/14/20 0353 04/15/20 0341 04/16/20 0610 04/17/20 0237 04/18/20 0407  ALBUMIN 2.0* 1.9* 1.8* 2.0* 1.9*   No results for input(s): LIPASE, AMYLASE in the last 168 hours. No results for input(s): AMMONIA in the last 168 hours. CBC: Recent Labs  Lab 04/13/20 0738 04/14/20 0353 04/15/20 0341 04/16/20 0610 04/17/20 0237  WBC 15.9* 15.9* 15.3* 15.2* 14.7*  HGB 8.1* 7.9* 7.9* 8.0* 8.0*  HCT 25.7* 25.2* 24.9* 24.9* 25.3*  MCV 95.9 96.6 97.3 95.8 96.6  PLT 315 319 314 322 323   Cardiac Enzymes: No results for input(s): CKTOTAL, CKMB, CKMBINDEX, TROPONINI in the last 168 hours. BNP: Invalid input(s): POCBNP CBG: No results for input(s): GLUCAP in the last 168 hours. D-Dimer No results for input(s): DDIMER in the last 72 hours. Hgb A1c No results for input(s): HGBA1C in the last 72 hours. Lipid Profile No results for input(s): CHOL, HDL, LDLCALC, TRIG, CHOLHDL, LDLDIRECT in the last 72 hours. Thyroid function studies No results for input(s): TSH, T4TOTAL, T3FREE, THYROIDAB in the last 72 hours.  Invalid input(s): FREET3 Anemia work up No results for input(s): VITAMINB12, FOLATE, FERRITIN, TIBC, IRON, RETICCTPCT in  the last 72 hours. Urinalysis    Component Value Date/Time   COLORURINE YELLOW 04/08/2020 1657   APPEARANCEUR HAZY (A) 04/08/2020 1657   LABSPEC 1.011 04/08/2020 1657   PHURINE 7.0 04/08/2020 1657   GLUCOSEU NEGATIVE 04/08/2020 1657   HGBUR NEGATIVE 04/08/2020 1657   BILIRUBINUR NEGATIVE 04/08/2020 1657   KETONESUR NEGATIVE 04/08/2020 1657   PROTEINUR 100 (A) 04/08/2020 1657   UROBILINOGEN 0.2 12/16/2013 0929   NITRITE POSITIVE (A) 04/08/2020 1657   LEUKOCYTESUR MODERATE (A) 04/08/2020 1657   Sepsis Labs Invalid input(s): PROCALCITONIN,  WBC,  LACTICIDVEN Microbiology Recent Results (from the past 240 hour(s))  Culture, Urine     Status: Abnormal   Collection Time: 04/09/20  4:04 PM   Specimen: Urine, Clean Catch  Result Value Ref Range Status   Specimen Description URINE, CLEAN CATCH  Final   Special Requests   Final    NONE Performed at [facility], [place] [address] [place], [place] [address]    Culture >=100,000 COLONIES/mL PROTEUS MIRABILIS (A)  Final   Report Status 04/12/2020 FINAL  Final   Organism ID, Bacteria PROTEUS MIRABILIS (A)  Final      Susceptibility   Proteus mirabilis - MIC*    AMPICILLIN >=32 RESISTANT Resistant     CEFAZOLIN 8 SENSITIVE Sensitive     CEFEPIME <=0.12 SENSITIVE Sensitive     CEFTRIAXONE <=0.25 SENSITIVE Sensitive     CIPROFLOXACIN <=0.25 SENSITIVE Sensitive     GENTAMICIN >=16 RESISTANT Resistant     IMIPENEM 2 SENSITIVE Sensitive     NITROFURANTOIN 128 RESISTANT Resistant     TRIMETH/SULFA <=20 SENSITIVE Sensitive     AMPICILLIN/SULBACTAM >=32 RESISTANT Resistant     PIP/TAZO <=4 SENSITIVE Sensitive     * >=100,000 COLONIES/mL PROTEUS MIRABILIS     Time coordinating discharge: 8mins  SIGNED:   Kathie Dike, MD  [facility] 04/18/2020, 8:12 PM   If 7PM-7AM, please contact night-coverage [email or web address]

## 2020-04-18 NOTE — Progress Notes (Signed)
Physical Therapy Treatment Patient Details Name: Brian Wolf MRN: [id number] DOB: April 01, 1931 Today's Date: 04/18/2020    History of Present Illness Pt is an 87 y/o male admitted 3/5 secondary to dark stools; thought to be secondary to GI bleed. Pt is s/p EGD on 3/6 that showed large duodenal ulcer. Since admission, pt has developed wheezing as well. Thought to be secondary to CHF and possible aspiration PNA. Pt is s/p thoracentesis on 3/8. PMH includes HTN, CVA, CKD, and OSA on CPAP.    PT Comments    Pt seen today to work on progression of OOB mobility and walking endurance due to pt and family desire to return home. The pt was able to complete bed mobility and initial transfers (sit-stand) with minG and max multimodal cues, but no physical assist at this time. The pt's wife was present and expressed no concerns about his ability to transfer or get out of bed. The pt was then able to complete ~50 ft hallway ambulation with RW for stability, requires max cues for positioning in RW and posture, but is currently mobilizing on minG level. The pt's wife was educated at length on safety measures in the home, a home walking program, and equipment use (do not recommend use of rollator yet due to pt's instability and tendency to push the RW too far in front of him). The wife verbalized understanding, and remains confident d/c home is best plan for the pt. The pt continues to have deficits in strength, endurance, stability, and safety awareness that affect his ability to mobilize safely, recommend supervision from wife for all mobility and continued therapies to address deficits.     Follow Up Recommendations  Home health PT;Supervision for mobility/OOB     Equipment Recommendations  Other (comment) (pt has needed equipment)    Recommendations for Other Services       Precautions / Restrictions Precautions Precautions: Fall Restrictions Weight Bearing Restrictions: No    Mobility  Bed  Mobility Overal bed mobility: Needs Assistance Bed Mobility: Supine to Sit     Supine to sit: Min guard     General bed mobility comments: minG with cues for hand placement. pt reaching for assist, able to complete with increased time and use of bed rail    Transfers Overall transfer level: Needs assistance Equipment used: Rolling walker (2 wheeled) Transfers: Sit to/from Stand Sit to Stand: Min guard         General transfer comment: minG to power up with repeated cues for hand placement. pt able to power up and lower to recliner without physical assist  Ambulation/Gait Ambulation/Gait assistance: Min guard Gait Distance (Feet): 55 Feet Assistive device: Rolling walker (2 wheeled) Gait Pattern/deviations: Step-to pattern;Decreased stride length;Wide base of support;Shuffle;Trunk flexed Gait velocity: Decreased   General Gait Details: cues to remain inside RW through gait. cues for posture and RW management. discussed importance of RW for now rather than attempting rollator due to concerns for pt  safety      Balance Overall balance assessment: Needs assistance Sitting-balance support: No upper extremity supported;Feet supported Sitting balance-[name]: Good Sitting balance - Comments: close supervision   Standing balance support: Bilateral upper extremity supported;During functional activity Standing balance-[name]: Poor Standing balance comment: required RW                            Cognition Arousal/Alertness: Awake/alert Behavior During Therapy: Agitated;WFL for tasks assessed/performed Overall Cognitive Status: Impaired/Different from  baseline Area of Impairment: Safety/judgement;Problem solving;Following commands                       Following Commands: Follows one step commands consistently (with repeated cues for technique) Safety/Judgement: Decreased awareness of safety;Decreased awareness of deficits Awareness:  Emergent Problem Solving: Decreased initiation General Comments: pt with decreased mtoivation, but agreeable throughout session with good encouragement from wife. Pt initially frustrated when waking up, but agrees home is better dc plan. no self-correction with mobility and safety, even with cues      Exercises      General Comments General comments (skin integrity, edema, etc.): VSS on RA, wife present and educated multiple times on safety and home mobility plan      Pertinent Vitals/Pain Pain Assessment: No/denies pain Pain Intervention(s): Monitored during session;Repositioned           PT Goals (current goals can now be found in the care plan section) Acute Rehab PT Goals Patient Stated Goal: wife would like to take pt home PT Goal Formulation: With patient Time For Goal Achievement: 05/01/20 Potential to Achieve Goals: Fair Progress towards PT goals: Progressing toward goals    Frequency    Min 3X/week      PT Plan Discharge plan needs to be updated       AM-PAC PT "6 Clicks" Mobility   Outcome Measure  Help needed turning from your back to your side while in a flat bed without using bedrails?: A Little Help needed moving from lying on your back to sitting on the side of a flat bed without using bedrails?: A Little Help needed moving to and from a bed to a chair (including a wheelchair)?: A Little Help needed standing up from a chair using your arms (e.g., wheelchair or bedside chair)?: A Little Help needed to walk in hospital room?: A Little Help needed climbing 3-5 steps with a railing? : A Lot 6 Click Score: 17    End of Session Equipment Utilized During Treatment: Gait belt Activity Tolerance: Patient tolerated treatment well Patient left: in chair;with call bell/phone within reach;with chair alarm set;with family/visitor present Nurse Communication: Mobility status (d/c plan) PT Visit Diagnosis: Other abnormalities of gait and mobility  (R26.89);Difficulty in walking, not elsewhere classified (R26.2);Muscle weakness (generalized) (M62.81)     Time: 7681-1572 PT Time Calculation (min) (ACUTE ONLY): 29 min  Charges:  $Gait Training: 8-22 mins $Self Care/Home Management: 8-22                     Brian Wolf, PT, DPT   Acute Rehabilitation Department Pager #: [phone]   Otho Bellows 04/18/2020, 5:00 PM

## 2020-04-18 NOTE — Progress Notes (Signed)
Discharge summary packet provided to Pt/spouse with instructions. Spouse verbalized understanding of instructions.All questions and concerns were fully addressed. No complaints. D/C to home with [facility] as ordered. Pt remains alert/oriented in no apparent distress.Pt/spouse accompanied by this Probation officer and NT downstairs and situated to their private vehicle

## 2020-04-18 NOTE — TOC Transition Note (Signed)
Transition of Care [facility]) - CM/SW Discharge Note   Patient Details  Name: Brian Wolf MRN: [id number] Date of Birth: 1932-01-24  [facility] CM/SW Contact:  Sharin Mons, RN Phone Number: 04/18/2020, 5:17 PM   Clinical Narrative:    Patient will DC to: home Anticipated DC date: 04/18/2020 Family notified: wife Transport by: car  Admitted with GI bleed, Hx of  HTN, CVA bradycardia 2/2 sinus node dysfunction, CKD stage IV, GI bleed with history of duodenal ulcer in 10/2019, and OSA on CPAP . From home with wife. PTA wife states min assist with ADL's. Already has W/C, rolling walker and 3in 1/BSC.   Per MD patient ready for DC today. RN, patient, patient's family (son and wife) notified of DC.  After long multi convsrsations with wife and son Dr. Geryl Councilman regarding disposition plan both are now agreeable to pt discharging to home with home health services instead of SNF placement after PT session for today, refer to PT note. Wife states she has friends to assist with caring for pt once home. [facility] aware of d/c plan and plans to see pt over the weekend if possible or the latest Monday.  DME : rolling walker with seat, referral made with [employer]. Equipment will be delivered to pt's bedside prior to d/c.   Wife without Rx med concerns.  [facility] referral made to follow pt  In the community.  Pt f/u with PCP:  [name]  r RNCM will sign off for now as intervention is no longer needed. Please consult [name] again if new needs arise.    Final next level of care: Home w Home Health Services Barriers to Discharge: No Barriers Identified   Patient Goals and CMS Choice Patient states their goals for this hospitalization and ongoing recovery are:: to go home   Choice offered to / list presented to : Patient  Discharge Placement                       Discharge Plan and Services                          HH Arranged: PT,OT [place] Agency:  [facility] Date [place]: 04/08/20 Time [place]: [place] Representative spoke with at [place]: Amy  Social Determinants of Health ([place]) Interventions     Readmission Risk Interventions No flowsheet data found.

## 2020-04-21 ENCOUNTER — Other Ambulatory Visit: Payer: Self-pay

## 2020-04-21 DIAGNOSIS — K922 Gastrointestinal hemorrhage, unspecified: Secondary | ICD-10-CM | POA: Diagnosis not present

## 2020-04-21 DIAGNOSIS — N184 Chronic kidney disease, stage 4 (severe): Secondary | ICD-10-CM | POA: Diagnosis not present

## 2020-04-21 DIAGNOSIS — J690 Pneumonitis due to inhalation of food and vomit: Secondary | ICD-10-CM | POA: Diagnosis not present

## 2020-04-21 DIAGNOSIS — D62 Acute posthemorrhagic anemia: Secondary | ICD-10-CM | POA: Diagnosis not present

## 2020-04-21 DIAGNOSIS — I132 Hypertensive heart and chronic kidney disease with heart failure and with stage 5 chronic kidney disease, or end stage renal disease: Secondary | ICD-10-CM | POA: Diagnosis not present

## 2020-04-21 DIAGNOSIS — J90 Pleural effusion, not elsewhere classified: Secondary | ICD-10-CM | POA: Diagnosis not present

## 2020-04-21 DIAGNOSIS — L89152 Pressure ulcer of sacral region, stage 2: Secondary | ICD-10-CM | POA: Diagnosis not present

## 2020-04-21 DIAGNOSIS — M6281 Muscle weakness (generalized): Secondary | ICD-10-CM | POA: Diagnosis not present

## 2020-04-21 DIAGNOSIS — I5043 Acute on chronic combined systolic (congestive) and diastolic (congestive) heart failure: Secondary | ICD-10-CM | POA: Diagnosis not present

## 2020-04-22 DIAGNOSIS — L89152 Pressure ulcer of sacral region, stage 2: Secondary | ICD-10-CM | POA: Diagnosis not present

## 2020-04-22 DIAGNOSIS — D62 Acute posthemorrhagic anemia: Secondary | ICD-10-CM | POA: Diagnosis not present

## 2020-04-22 DIAGNOSIS — I5043 Acute on chronic combined systolic (congestive) and diastolic (congestive) heart failure: Secondary | ICD-10-CM | POA: Diagnosis not present

## 2020-04-22 DIAGNOSIS — M6281 Muscle weakness (generalized): Secondary | ICD-10-CM | POA: Diagnosis not present

## 2020-04-22 DIAGNOSIS — J690 Pneumonitis due to inhalation of food and vomit: Secondary | ICD-10-CM | POA: Diagnosis not present

## 2020-04-22 DIAGNOSIS — I132 Hypertensive heart and chronic kidney disease with heart failure and with stage 5 chronic kidney disease, or end stage renal disease: Secondary | ICD-10-CM | POA: Diagnosis not present

## 2020-04-22 DIAGNOSIS — K922 Gastrointestinal hemorrhage, unspecified: Secondary | ICD-10-CM | POA: Diagnosis not present

## 2020-04-22 DIAGNOSIS — J90 Pleural effusion, not elsewhere classified: Secondary | ICD-10-CM | POA: Diagnosis not present

## 2020-04-22 DIAGNOSIS — N184 Chronic kidney disease, stage 4 (severe): Secondary | ICD-10-CM | POA: Diagnosis not present

## 2020-04-24 DIAGNOSIS — L89152 Pressure ulcer of sacral region, stage 2: Secondary | ICD-10-CM | POA: Diagnosis not present

## 2020-04-24 DIAGNOSIS — J90 Pleural effusion, not elsewhere classified: Secondary | ICD-10-CM | POA: Diagnosis not present

## 2020-04-24 DIAGNOSIS — D62 Acute posthemorrhagic anemia: Secondary | ICD-10-CM | POA: Diagnosis not present

## 2020-04-24 DIAGNOSIS — J690 Pneumonitis due to inhalation of food and vomit: Secondary | ICD-10-CM | POA: Diagnosis not present

## 2020-04-24 DIAGNOSIS — K922 Gastrointestinal hemorrhage, unspecified: Secondary | ICD-10-CM | POA: Diagnosis not present

## 2020-04-24 DIAGNOSIS — N184 Chronic kidney disease, stage 4 (severe): Secondary | ICD-10-CM | POA: Diagnosis not present

## 2020-04-24 DIAGNOSIS — M6281 Muscle weakness (generalized): Secondary | ICD-10-CM | POA: Diagnosis not present

## 2020-04-24 DIAGNOSIS — I5043 Acute on chronic combined systolic (congestive) and diastolic (congestive) heart failure: Secondary | ICD-10-CM | POA: Diagnosis not present

## 2020-04-24 DIAGNOSIS — I132 Hypertensive heart and chronic kidney disease with heart failure and with stage 5 chronic kidney disease, or end stage renal disease: Secondary | ICD-10-CM | POA: Diagnosis not present

## 2020-04-25 ENCOUNTER — Other Ambulatory Visit: Payer: Self-pay | Admitting: *Deleted

## 2020-04-25 NOTE — Patient Outreach (Addendum)
[facility]) Care Management  04/25/2020  Brian Wolf 17-Feb-1931 [id number]   [facility] Unsuccessful Telephone Assessment/Screen for Disease management referral  Referral Date: 04/23/20 Referral Source: [place] inpatient RN CM, Brian Wolf/[facility] hospital liaison, [facility] Referral Reason: Disease management Insurance: united healthcare medicare     Outreach attempt # 1  Unsuccessful No answer initially. During the time [facility] RN CM was leaving a HIPAA ([place]) compliant voicemail message along with CM's contact info, Brian Wolf answered. [facility] RN CM discussed the reason for the outreach and received referral. Inquired to speak with patient. Brian Wolf informed [facility] RN CM pt was not able to speak. [facility] RN CM assessed for orientation and was informed that pt could not speak related to his age "47"  Brian Wolf began to ventilate that the pt was receiving better care at home than he had at the hospital.  [facility] RN CM discussed [facility], the reason for the call and referral and began to discuss the reason [facility] RN CM inquired to speak with pt related to HIPAA but was interrupted. Brian Wolf completed several statements related to the pt not being able to talk, [facility] RN CM would have to speak with her, "that is all I have to say about that" and disconnected the line  [facility] RN CM outreach to [facility] listed [name], Dr Suann Larry. Reviewed the reason for the outreach and received referral.  Shared the content of the outreach with Brian Wolf. Dr Geryl Councilman shared background information, family dynamics and collateral information. Pt is oriented, does speak and will provide permission to speak with Brian Wolf, wife. Dr Suann Larry and [facility] RN CM agreed for another attempt for engagement within 7-10 business days.  [facility] RN CM contact information provided to Dr Geryl Councilman   Patient goal interventions- pending  [facility] RN CM care coordination -pending  [facility] RN CM disease  management/education -pending   Social: lives at home with Brian Wolf, wife. Scheduled for home health services and palliative care services  Patient Active Problem List   Diagnosis Date Noted  . Malnutrition of moderate degree 04/14/2020  . SOB (shortness of breath)   . Bilateral lower extremity edema 04/05/2020  . Acute blood loss anemia 04/05/2020  . Pleural effusion 04/05/2020  . Acute diastolic CHF (congestive heart failure) ([place]) 04/05/2020  . Acute upper GI bleed 10/17/2019  . Hyperkalemia 10/17/2019  . Acute kidney injury superimposed on chronic kidney disease ([place]) 10/17/2019  . Fall   . History of stroke 03/09/2016  . Sleep apnea 03/09/2016  . SAH (subarachnoid hemorrhage) ([place]) 03/09/2016  . Subarachnoid bleed ([place]) 03/08/2016  . Essential hypertension   . Sleep difficulties   . Homonymous hemianopsia 01/14/2014  . Homonymous hemianopsia due to recent cerebral infarction 12/24/2013  . Left-sided neglect 12/24/2013  . Gout flare 12/20/2013  . Benign essential HTN 12/20/2013  . Acute hemorrhagic infarction of brain ([place]) 12/20/2013  . Hemorrhagic stroke ([place])   . Other secondary acute gout of left foot   . Hypokalemia 12/19/2013  . Altered mental status   . Encounter for central line placement   . Encephalopathy acute   . ICH (intracerebral hemorrhage) ([place]) 12/14/2013  . Fever    Past Medical History:  Diagnosis Date  . CVA (cerebral infarction) 12/2013  . Dementia ([place])   . Gastric ulcer    prior to 2000  . Hypertension   . Sleep difficulties       Plan: [facility] RN CM will attempt  another outreach for engagement within 7-10 business days unsuccessful outreach letter sent to home address.   Chyrl Elwell L. Lavina Hamman, RN, BSN, [place] Coordinator Office number [phone] Mobile number [phone]  Main [facility] number [phone] Fax number [phone]

## 2020-04-29 DIAGNOSIS — I132 Hypertensive heart and chronic kidney disease with heart failure and with stage 5 chronic kidney disease, or end stage renal disease: Secondary | ICD-10-CM | POA: Diagnosis not present

## 2020-04-29 DIAGNOSIS — I5043 Acute on chronic combined systolic (congestive) and diastolic (congestive) heart failure: Secondary | ICD-10-CM | POA: Diagnosis not present

## 2020-04-29 DIAGNOSIS — D62 Acute posthemorrhagic anemia: Secondary | ICD-10-CM | POA: Diagnosis not present

## 2020-04-29 DIAGNOSIS — L89152 Pressure ulcer of sacral region, stage 2: Secondary | ICD-10-CM | POA: Diagnosis not present

## 2020-04-29 DIAGNOSIS — M6281 Muscle weakness (generalized): Secondary | ICD-10-CM | POA: Diagnosis not present

## 2020-04-29 DIAGNOSIS — K922 Gastrointestinal hemorrhage, unspecified: Secondary | ICD-10-CM | POA: Diagnosis not present

## 2020-04-29 DIAGNOSIS — J90 Pleural effusion, not elsewhere classified: Secondary | ICD-10-CM | POA: Diagnosis not present

## 2020-04-29 DIAGNOSIS — N184 Chronic kidney disease, stage 4 (severe): Secondary | ICD-10-CM | POA: Diagnosis not present

## 2020-04-29 DIAGNOSIS — J690 Pneumonitis due to inhalation of food and vomit: Secondary | ICD-10-CM | POA: Diagnosis not present

## 2020-05-01 ENCOUNTER — Other Ambulatory Visit: Payer: Self-pay | Admitting: *Deleted

## 2020-05-01 DIAGNOSIS — G4733 Obstructive sleep apnea (adult) (pediatric): Secondary | ICD-10-CM | POA: Diagnosis not present

## 2020-05-01 DIAGNOSIS — J690 Pneumonitis due to inhalation of food and vomit: Secondary | ICD-10-CM | POA: Diagnosis not present

## 2020-05-01 DIAGNOSIS — Z862 Personal history of diseases of the blood and blood-forming organs and certain disorders involving the immune mechanism: Secondary | ICD-10-CM | POA: Insufficient documentation

## 2020-05-01 DIAGNOSIS — Z7689 Persons encountering health services in other specified circumstances: Secondary | ICD-10-CM | POA: Diagnosis not present

## 2020-05-01 DIAGNOSIS — Z09 Encounter for follow-up examination after completed treatment for conditions other than malignant neoplasm: Secondary | ICD-10-CM | POA: Diagnosis not present

## 2020-05-01 DIAGNOSIS — H903 Sensorineural hearing loss, bilateral: Secondary | ICD-10-CM | POA: Insufficient documentation

## 2020-05-01 DIAGNOSIS — D126 Benign neoplasm of colon, unspecified: Secondary | ICD-10-CM | POA: Insufficient documentation

## 2020-05-01 DIAGNOSIS — N400 Enlarged prostate without lower urinary tract symptoms: Secondary | ICD-10-CM | POA: Insufficient documentation

## 2020-05-01 DIAGNOSIS — J90 Pleural effusion, not elsewhere classified: Secondary | ICD-10-CM | POA: Diagnosis not present

## 2020-05-01 DIAGNOSIS — D62 Acute posthemorrhagic anemia: Secondary | ICD-10-CM | POA: Diagnosis not present

## 2020-05-01 DIAGNOSIS — Z8719 Personal history of other diseases of the digestive system: Secondary | ICD-10-CM | POA: Diagnosis not present

## 2020-05-01 DIAGNOSIS — I429 Cardiomyopathy, unspecified: Secondary | ICD-10-CM | POA: Insufficient documentation

## 2020-05-01 DIAGNOSIS — I132 Hypertensive heart and chronic kidney disease with heart failure and with stage 5 chronic kidney disease, or end stage renal disease: Secondary | ICD-10-CM | POA: Diagnosis not present

## 2020-05-01 DIAGNOSIS — Z8673 Personal history of transient ischemic attack (TIA), and cerebral infarction without residual deficits: Secondary | ICD-10-CM | POA: Diagnosis not present

## 2020-05-01 DIAGNOSIS — E785 Hyperlipidemia, unspecified: Secondary | ICD-10-CM | POA: Insufficient documentation

## 2020-05-01 DIAGNOSIS — N184 Chronic kidney disease, stage 4 (severe): Secondary | ICD-10-CM | POA: Diagnosis not present

## 2020-05-01 DIAGNOSIS — I129 Hypertensive chronic kidney disease with stage 1 through stage 4 chronic kidney disease, or unspecified chronic kidney disease: Secondary | ICD-10-CM | POA: Insufficient documentation

## 2020-05-01 DIAGNOSIS — H35039 Hypertensive retinopathy, unspecified eye: Secondary | ICD-10-CM | POA: Insufficient documentation

## 2020-05-01 DIAGNOSIS — K922 Gastrointestinal hemorrhage, unspecified: Secondary | ICD-10-CM | POA: Diagnosis not present

## 2020-05-01 DIAGNOSIS — H409 Unspecified glaucoma: Secondary | ICD-10-CM | POA: Insufficient documentation

## 2020-05-01 DIAGNOSIS — L89152 Pressure ulcer of sacral region, stage 2: Secondary | ICD-10-CM | POA: Diagnosis not present

## 2020-05-01 DIAGNOSIS — I5043 Acute on chronic combined systolic (congestive) and diastolic (congestive) heart failure: Secondary | ICD-10-CM | POA: Diagnosis not present

## 2020-05-01 DIAGNOSIS — I495 Sick sinus syndrome: Secondary | ICD-10-CM | POA: Insufficient documentation

## 2020-05-01 DIAGNOSIS — M6281 Muscle weakness (generalized): Secondary | ICD-10-CM | POA: Diagnosis not present

## 2020-05-01 NOTE — Patient Outreach (Signed)
[facility] [facility]) Care Management  05/01/2020  Kanai Berrios [birth year] [id number]   [facility] Telephone Assessment/Screen for Disease management referral  Referral Date: 04/23/20 Referral Source: [place] inpatient RN CM, Angela/[facility] hospital liaison, [facility] Referral Reason: Disease management Insurance: united healthcare medicare     Outreach attempt # 2 to [phone]  Successful outreach with Mrs [name] [facility] RN CM discussed the reason for the outreach to her on Mr Baskett's behalf and the received referral for disease management Mrs [name] informed [facility] RN CM that she "take care of him better than he was ever taken care of at the hospital." She informed [facility] RN CM pt had been to his doctor. Informed [facility] RN CM "you don't have to worry about calling here any more." The call was disconnected.  [facility] RN CM updated Dr Geryl Councilman [facility] POA of case closure and information sent to primary care provider (PCP)     Social: lives at home with [name], wife. Scheduled for home health services and palliative care services  Patient Active Problem List   Diagnosis Date Noted  . Sensorineural hearing loss, bilateral 05/01/2020  . Hyperlipidemia 05/01/2020  . Benign neoplasm of colon 05/01/2020  . Benign prostatic hyperplasia 05/01/2020  . Cardiomyopathy ([place]) 05/01/2020  . Dyslipidemia 05/01/2020  . Glaucoma 05/01/2020  . History of anemia 05/01/2020  . Hypertensive retinopathy 05/01/2020  . Malignant hypertensive chronic kidney disease 05/01/2020  . Personal history of other diseases of the digestive system 05/01/2020  . Sinus node dysfunction ([place]) 05/01/2020  . Malnutrition of moderate degree 04/14/2020  . SOB (shortness of breath)   . Bilateral lower extremity edema 04/05/2020  . Acute blood loss anemia 04/05/2020  . Pleural effusion 04/05/2020  . Acute diastolic CHF (congestive heart failure) ([place]) 04/05/2020  . Acute upper GI bleed 10/17/2019  . Hyperkalemia 10/17/2019  .  Acute kidney injury superimposed on chronic kidney disease ([place]) 10/17/2019  . Fall   . Personal history of transient ischemic attack (TIA), and cerebral infarction without residual deficits 03/09/2016  . Obstructive sleep apnea syndrome 03/09/2016  . SAH (subarachnoid hemorrhage) ([place]) 03/09/2016  . Subarachnoid bleed ([place]) 03/08/2016  . Hypertension, essential   . Sleep difficulties   . Homonymous hemianopsia 01/14/2014  . Homonymous hemianopsia due to recent cerebral infarction 12/24/2013  . Left-sided neglect 12/24/2013  . Gout flare 12/20/2013  . Benign essential HTN 12/20/2013  . Acute hemorrhagic infarction of brain ([place]) 12/20/2013  . Hemorrhagic stroke ([place])   . Gout   . Hypokalemia 12/19/2013  . Altered mental status   . Encounter for immunization   . Encephalopathy acute   . ICH (intracerebral hemorrhage) ([place]) 12/14/2013  . Fever     Plans [facility] case closure -consumer is eligible for program but his wife, [name], refused to participate  Letters to pt and pcp    Berdella Bacot L. Lavina Hamman, RN, BSN, [place] Coordinator Office number [phone] [facility] number [phone] Fax number [phone]

## 2020-05-05 ENCOUNTER — Other Ambulatory Visit: Payer: Self-pay | Admitting: Student

## 2020-05-05 ENCOUNTER — Ambulatory Visit: Payer: Medicare Other | Admitting: Student

## 2020-05-05 ENCOUNTER — Encounter: Payer: Self-pay | Admitting: Student

## 2020-05-05 ENCOUNTER — Other Ambulatory Visit: Payer: Self-pay

## 2020-05-05 VITALS — BP 152/78 | HR 65 | Temp 98.3°F | Resp 16 | Ht 65.0 in | Wt 160.0 lb

## 2020-05-05 DIAGNOSIS — I10 Essential (primary) hypertension: Secondary | ICD-10-CM | POA: Diagnosis not present

## 2020-05-05 DIAGNOSIS — N1832 Chronic kidney disease, stage 3b: Secondary | ICD-10-CM | POA: Diagnosis not present

## 2020-05-05 DIAGNOSIS — I495 Sick sinus syndrome: Secondary | ICD-10-CM | POA: Diagnosis not present

## 2020-05-05 DIAGNOSIS — I5042 Chronic combined systolic (congestive) and diastolic (congestive) heart failure: Secondary | ICD-10-CM | POA: Diagnosis not present

## 2020-05-05 MED ORDER — POTASSIUM CHLORIDE CRYS ER 20 MEQ PO TBCR: 20.0000 meq | EXTENDED_RELEASE_TABLET | Freq: Every day | ORAL | 3 refills | Status: DC | PRN

## 2020-05-05 NOTE — Progress Notes (Signed)
Primary Physician/Referring:  [name], DO  Patient ID: Brian Wolf, male    DOB: November 16, 1931, 87 y.o.   MRN: [id number]  Chief Complaint  Patient presents with  . Hypertension  . sinus node dysfunction  . Follow-up    6 months   HPI:    Brian Wolf  is a 87 y.o. caucasian male with history of stroke in 2015 with no recurrence and has mild residual left facial and also left upper extremity weakness, difficult to control hypertension, hyperlipidemia, sinus node dysfunction.   Patient was hospitalized 10/17/2019-10/22/2019 for upper GI bleed following episode of syncope. At that time patient was evaluated by Dr. Curt Bears who felt there was no indication for pacemaker at that time. Patient was again hospitalized 04/05/2020-04/18/2020 recurrent upper GI bleed and acute kidney injury. During this admission patient also develpoed aspiration pnemonia as well as new onset systolic heart failure with LVEF 35-40%.   Patient now presents for follow up. Since discharge patient is doing relatively well. He continues to do home PT twice weekly without issue and has been independently walking up and down the ramp into his home to increase his strength. He denies chest pain, palpitations, dizziness, syncope, near-syncope. He dose admit to dyspnea on exertion and fatigue, as well as increased leg swelling bilaterally. Patient's weight has also trended up since hospitalization.   Past Medical History:  Diagnosis Date  . CVA (cerebral infarction) 12/2013  . Dementia ([place])   . Gastric ulcer    prior to 2000  . Hypertension   . Sleep difficulties    Past Surgical History:  Procedure Laterality Date  . ABDOMINAL SURGERY    . BIOPSY  04/06/2020   Procedure: BIOPSY;  Surgeon: Ronald Lobo, MD;  Location: [place];  Service: Endoscopy;;  . CATARACT EXTRACTION Left   . ESOPHAGOGASTRODUODENOSCOPY N/A 10/18/2019   Procedure: ESOPHAGOGASTRODUODENOSCOPY (EGD);  Surgeon: Wonda Horner, MD;   Location: [facility] ENDOSCOPY;  Service: Endoscopy;  Laterality: N/A;  . ESOPHAGOGASTRODUODENOSCOPY (EGD) WITH PROPOFOL N/A 04/06/2020   Procedure: ESOPHAGOGASTRODUODENOSCOPY (EGD) WITH PROPOFOL;  Surgeon: Ronald Lobo, MD;  Location: [place];  Service: Endoscopy;  Laterality: N/A;  . IR THORACENTESIS ASP PLEURAL SPACE W/IMG GUIDE  04/08/2020   Family History  Problem Relation Age of Onset  . Hyperlipidemia Mother   . Hypertension Mother   . Hypertension Father     Social History   Tobacco Use  . Smoking status: Never Smoker  . Smokeless tobacco: Never Used  Substance Use Topics  . Alcohol use: No    Alcohol/week: 0.0 standard drinks   Marital Status: Married  ROS  Review of Systems  Constitutional: Positive for malaise/fatigue. Negative for weight gain.  Cardiovascular: Positive for dyspnea on exertion. Negative for chest pain, claudication, leg swelling, near-syncope, orthopnea, palpitations, paroxysmal nocturnal dyspnea and syncope.  Respiratory: Negative for shortness of breath.   Hematologic/Lymphatic: Does not bruise/bleed easily.  Gastrointestinal: Negative for melena.  Neurological: Negative for dizziness and weakness.   Objective  Blood pressure (!) 152/78, pulse 65, temperature 98.3 F (36.8 C), resp. rate 16, height '5\' 5"'  (1.651 m), weight 160 lb (72.6 kg), SpO2 97 %.  Vitals with BMI 05/05/2020 04/18/2020 04/18/2020  Height '5\' 5"'  - -  Weight 160 lbs - -  BMI 17.71 - -  Systolic 165 790 383  Diastolic 78 61 73  Pulse 65 69 69   Physical Exam Constitutional:      General: He is not in acute distress.  Comments: Patient appears fatigued  Cardiovascular:     Rate and Rhythm: Normal rate and regular rhythm.     Pulses: Intact distal pulses.          Carotid pulses are 2+ on the right side and 2+ on the left side.      Radial pulses are 2+ on the right side and 2+ on the left side.     Heart sounds: Normal heart sounds. No murmur heard. No gallop.      Comments: No  JVD.  Pulmonary:     Effort: Pulmonary effort is normal.     Breath sounds: Wheezing (bilaterally throughout ) present.  Abdominal:     General: Bowel sounds are normal.     Palpations: Abdomen is soft.  Musculoskeletal:     Right lower leg: Edema (1+ pittting ) present.     Left lower leg: Edema (1+ pitting ) present.     Comments: 1+ pitting of right UE   Neurological:     Mental Status: He is alert.    Laboratory examination:   Recent Labs    10/20/19 0533 10/21/19 0547 10/21/19 1658 10/22/19 0122 04/05/20 0729 04/16/20 0610 04/17/20 0237 04/18/20 0407  NA 137 136  --  135   < > 137 137 136  K 5.0 5.1   < > 4.5   < > 3.9 4.0 4.1  CL 112* 107  --  107   < > 108 107 106  CO2 18* 20*  --  21*   < > 22 23 21*  GLUCOSE 111* 95  --  113*   < > 101* 100* 104*  BUN 53* 49*  --  44*   < > 51* 51* 50*  CREATININE 1.88* 2.06*  --  1.79*   < > 2.64* 2.62* 2.54*  CALCIUM 8.5* 8.2*  --  8.0*   < > 7.6* 7.7* 7.7*  GFRNONAA 32* 28*  --  34*   < > 23* 23* 24*  GFRAA 37* 33*  --  39*  --   --   --   --    < > = values in this interval not displayed.   CrCl cannot be calculated (Patient's most recent lab result is older than the maximum 21 days allowed.).  CMP Latest Ref Rng & Units 04/18/2020 04/17/2020 04/16/2020  Glucose 70 - 99 mg/dL 104(H) 100(H) 101(H)  BUN 8 - 23 mg/dL 50(H) 51(H) 51(H)  Creatinine 0.61 - 1.24 mg/dL 2.54(H) 2.62(H) 2.64(H)  Sodium 135 - 145 mmol/L 136 137 137  Potassium 3.5 - 5.1 mmol/L 4.1 4.0 3.9  Chloride 98 - 111 mmol/L 106 107 108  CO2 22 - 32 mmol/L 21(L) 23 22  Calcium 8.9 - 10.3 mg/dL 7.7(L) 7.7(L) 7.6(L)  Total Protein 6.5 - 8.1 g/dL - - -  Total Bilirubin 0.3 - 1.2 mg/dL - - -  Alkaline Phos 38 - 126 U/L - - -  AST 15 - 41 U/L - - -  ALT 0 - 44 U/L - - -   CBC Latest Ref Rng & Units 04/17/2020 04/16/2020 04/15/2020  WBC 4.0 - 10.5 K/uL 14.7(H) 15.2(H) 15.3(H)  Hemoglobin 13.0 - 17.0 g/dL 8.0(L) 8.0(L) 7.9(L)  Hematocrit 39.0 - 52.0 % 25.3(L)  24.9(L) 24.9(L)  Platelets 150 - 400 K/uL 323 322 314    Lipid Panel No results for input(s): CHOL, TRIG, LDLCALC, VLDL, HDL, CHOLHDL, LDLDIRECT in the last 8760 hours.  HEMOGLOBIN A1C Lab Results  Component  Value Date   HGBA1C 6.4 (H) 12/15/2013   MPG 137 (H) 12/15/2013   TSH Recent Labs    10/20/19 0706 04/05/20 1847  TSH 2.706 4.188    External labs:   07/17/2019: Hb 12.1/HCT 37.9, platelets 219.  Normal indicis.  Serum glucose 98 mg, BUN 30, creatinine 1.36, EGFR 45 mill, potassium 5.4.  Sodium 144.  CMP otherwise normal.  TSH normal at 4.220.  Medications and allergies   Allergies  Allergen Reactions  . Lisinopril     Other reaction(s): COUGHING  . Nsaids     Other reaction(s): GI bleed     Current Outpatient Medications  Medication Instructions  . fexofenadine (ALLEGRA) 180 MG tablet 1 tablet  . furosemide (LASIX) 20 mg, Oral, Every other day  . hydrALAZINE (APRESOLINE) 50 mg, Oral, 3 times daily  . hydrochlorothiazide (HYDRODIURIL) 25 MG tablet 1 tablet, Oral, Daily  . isosorbide dinitrate (ISORDIL) 30 mg, Oral, 3 times daily  . lactulose (CHRONULAC) 10 g, Oral, Daily PRN  . metoprolol succinate (TOPROL-XL) 25 mg, Oral, Daily  . NIFEdipine (ADALAT CC) 60 MG 24 hr tablet 1 tablet  . pantoprazole (PROTONIX) 40 mg, Oral, 2 times daily before meals  . potassium chloride SA (KLOR-CON) 20 MEQ tablet 20 mEq, Oral, Daily PRN  . Probiotic Product (PROBIOTIC DAILY PO) 1 capsule, Oral, Daily  . simvastatin (ZOCOR) 40 mg, Oral, Daily  . tamsulosin (FLOMAX) 0.4 mg, Oral, Daily, 30 minutes after each meal  . Travoprost, BAK Free, (TRAVATAN) 0.004 % SOLN ophthalmic solution 1 drop into affected eye in the evening    Radiology:   No results found.  Cardiac Studies:   Echocardiogram 03/09/2016:  Left ventricle: The cavity size was normal. Wall thickness was  increased in a pattern of mild LVH. Systolic function was  vigorous. The estimated ejection fraction  was in the range of 65%  to 70%. Wall motion was normal; there were no regional wall  motion abnormalities. Doppler parameters are consistent with abnormal left ventricular relaxation (grade 1 diastolic dysfunction).  - Aortic valve: Trileaflet; mildly thickened, mildly calcified leaflets.  - Left atrium: The atrium was mildly dilated  Long Term Monitor 11/02/2019:  Max 139 bpm 09:19am, 09/18 Min 31 bpm 05:31am, 09/15 Avg 71 bpm 1.4% PACs 3.2% PVCs Predominant rhythm was sinus rhythm No prolonged arrhythmias noted Multiple episodes of complete AV block with a narrow QRS escape occurred between 5-5:30 AM  Echocardiogram 04/06/2020:  1. Left ventricular ejection fraction, by estimation, is 35 to 40%. The  left ventricle has moderately decreased function. The left ventricle has  no regional wall motion abnormalities. Left ventricular diastolic  parameters are consistent with Grade I  diastolic dysfunction (impaired relaxation).  2. Right ventricular systolic function is normal. The right ventricular  size is normal. There is normal pulmonary artery systolic pressure.  3. The mitral valve is normal in structure. No evidence of mitral valve  regurgitation. No evidence of mitral stenosis.  4. The aortic valve is tricuspid. Aortic valve regurgitation is mild.  Mild to moderate aortic valve sclerosis/calcification is present, without  any evidence of aortic stenosis.  5. The inferior vena cava is normal in size with greater than 50%  respiratory variability, suggesting right atrial pressure of 3 mmHg.  Upper Extremity Venous Ultrasound 04/14/2020:  Right:  No evidence of deep vein thrombosis in the upper extremity. No evidence of  superficial vein thrombosis in the upper extremity.    Left:  No evidence of deep vein thrombosis  in the upper extremity. No evidence of  superficial vein thrombosis in the upper extremity.    EKG EKG 05/05/2020: Sinus rhythm with first degree AV  block at a rate of 64 bpm.  Left axis, left anterior fascicular block.  Poor R wave progression, cannot exclude anteroseptal infarct old.  Nonspecific T wave abnormality.  EKG 11/05/2019: Sinus rhythm at a rate of 84 bpm with first-degree AV block and 2 PVCs.  Normal axis.  Poor R wave progression, cannot exclude anterior septal infarct old.   EKG 10/11/2019: Ectopic atrial rhythm at 59 bpm. Left axis deviation. Poor R wave progression, cannot exclude anteroseptal infarct old.   EKG 10/04/2019: Sinus node dysfunction with severe bradycardia at 36 bpm. Cannot exclude underlying atrial fibrillation. Will likely need pacemaker.   EKG 09/03/2019: Marked sinus bradycardia at the rate of 49 bpm with first-degree AV block, normal axis, nonspecific T abnormality.    Assessment     ICD-10-CM   1. Chronic combined systolic and diastolic heart failure (HCC)  I50.42   2. Essential hypertension  I10 EKG [id number]    Basic metabolic panel    Brain natriuretic peptide  3. Sinus node dysfunction (HCC)  I49.5   4. Stage 3b chronic kidney disease (HCC)  N18.32      Medications Discontinued During This Encounter  Medication Reason  . aspirin 81 MG EC tablet Error  . aspirin 81 MG EC tablet Error  . fexofenadine (ALLEGRA) 30 MG tablet Error  . hydrALAZINE (APRESOLINE) 50 MG tablet Error  . isosorbide dinitrate (ISORDIL) 30 MG tablet Error  . metoprolol tartrate (LOPRESSOR) 50 MG tablet Error  . losartan (COZAAR) 100 MG tablet Error  . simvastatin (ZOCOR) 40 MG tablet Error  . tamsulosin (FLOMAX) 0.4 MG CAPS capsule Error  . Travoprost, BAK Free, (TRAVATAN) 0.004 % SOLN ophthalmic solution Error     Recommendations:   Brian Wolf  is a  87 y.o. Caucasian male with history of stroke in 2015 with no recurrence and has mild residual left facial and also left upper extremity weakness, difficult to control hypertension, hyperlipidemia and sinus node dysfunction.   Patient was hospitalized  10/17/2019-10/22/2019 for upper GI bleed following episode of syncope. At that time patient was evaluated by Dr. Curt Bears who felt there was no indication for pacemaker at that time. Patient was again hospitalized 04/05/2020-04/18/2020 recurrent upper GI bleed and acute kidney injury. During this admission patient also develpoed aspiration pnemonia as well as new onset systolic heart failure with LVEF 35-40%.   Patient now presents for follow up. Reviewed and discussed at length regarding patient's hospital stay, combine systolic and diastolic heart failure, and medication changes. Patient's wife, who is present at bedside and his primary caretaker, had several questions and all were addressed to her satisfaction.   In regard to heart failure patient would benefit from addition of Entresto and/or spironolactone, however will hold off at this time in view of recent decline in renal function. He is tolerating metoprolol and simvastatin, as well as Bidil, will continue these. Patient is volume overloaded on exam, will therefore increase Lasix to 20 mg daily for 1 week. Will then repeat BNP and BMP.   Patient hypertension is uncontrolled, will therefore resume nifedipine 60 mg daily, which had been stopped at discharge.   Again counseled patient regarding heart healthy, low sodium diet.   Follow up in 2 weeks for heart failure and medication titration.    This was a 45-minute encounter with face-to-face counseling,  medical records review, coordination of care, explanation of complex medical issues, complex medical decision making.     Alethia Berthold, PA-C 05/10/2020, 8:22 AM Office: [phone]

## 2020-05-06 DIAGNOSIS — K922 Gastrointestinal hemorrhage, unspecified: Secondary | ICD-10-CM | POA: Diagnosis not present

## 2020-05-06 DIAGNOSIS — J690 Pneumonitis due to inhalation of food and vomit: Secondary | ICD-10-CM | POA: Diagnosis not present

## 2020-05-06 DIAGNOSIS — I132 Hypertensive heart and chronic kidney disease with heart failure and with stage 5 chronic kidney disease, or end stage renal disease: Secondary | ICD-10-CM | POA: Diagnosis not present

## 2020-05-06 DIAGNOSIS — D62 Acute posthemorrhagic anemia: Secondary | ICD-10-CM | POA: Diagnosis not present

## 2020-05-06 DIAGNOSIS — J90 Pleural effusion, not elsewhere classified: Secondary | ICD-10-CM | POA: Diagnosis not present

## 2020-05-06 DIAGNOSIS — L89152 Pressure ulcer of sacral region, stage 2: Secondary | ICD-10-CM | POA: Diagnosis not present

## 2020-05-06 DIAGNOSIS — I5043 Acute on chronic combined systolic (congestive) and diastolic (congestive) heart failure: Secondary | ICD-10-CM | POA: Diagnosis not present

## 2020-05-06 DIAGNOSIS — M6281 Muscle weakness (generalized): Secondary | ICD-10-CM | POA: Diagnosis not present

## 2020-05-06 DIAGNOSIS — N184 Chronic kidney disease, stage 4 (severe): Secondary | ICD-10-CM | POA: Diagnosis not present

## 2020-05-07 DIAGNOSIS — K922 Gastrointestinal hemorrhage, unspecified: Secondary | ICD-10-CM | POA: Diagnosis not present

## 2020-05-07 DIAGNOSIS — N184 Chronic kidney disease, stage 4 (severe): Secondary | ICD-10-CM | POA: Diagnosis not present

## 2020-05-07 DIAGNOSIS — I132 Hypertensive heart and chronic kidney disease with heart failure and with stage 5 chronic kidney disease, or end stage renal disease: Secondary | ICD-10-CM | POA: Diagnosis not present

## 2020-05-07 DIAGNOSIS — J690 Pneumonitis due to inhalation of food and vomit: Secondary | ICD-10-CM | POA: Diagnosis not present

## 2020-05-07 DIAGNOSIS — M6281 Muscle weakness (generalized): Secondary | ICD-10-CM | POA: Diagnosis not present

## 2020-05-07 DIAGNOSIS — I5043 Acute on chronic combined systolic (congestive) and diastolic (congestive) heart failure: Secondary | ICD-10-CM | POA: Diagnosis not present

## 2020-05-07 DIAGNOSIS — D62 Acute posthemorrhagic anemia: Secondary | ICD-10-CM | POA: Diagnosis not present

## 2020-05-07 DIAGNOSIS — J90 Pleural effusion, not elsewhere classified: Secondary | ICD-10-CM | POA: Diagnosis not present

## 2020-05-07 DIAGNOSIS — L89152 Pressure ulcer of sacral region, stage 2: Secondary | ICD-10-CM | POA: Diagnosis not present

## 2020-05-08 DIAGNOSIS — D62 Acute posthemorrhagic anemia: Secondary | ICD-10-CM | POA: Diagnosis not present

## 2020-05-08 DIAGNOSIS — I132 Hypertensive heart and chronic kidney disease with heart failure and with stage 5 chronic kidney disease, or end stage renal disease: Secondary | ICD-10-CM | POA: Diagnosis not present

## 2020-05-08 DIAGNOSIS — M6281 Muscle weakness (generalized): Secondary | ICD-10-CM | POA: Diagnosis not present

## 2020-05-08 DIAGNOSIS — K922 Gastrointestinal hemorrhage, unspecified: Secondary | ICD-10-CM | POA: Diagnosis not present

## 2020-05-08 DIAGNOSIS — J690 Pneumonitis due to inhalation of food and vomit: Secondary | ICD-10-CM | POA: Diagnosis not present

## 2020-05-08 DIAGNOSIS — I5043 Acute on chronic combined systolic (congestive) and diastolic (congestive) heart failure: Secondary | ICD-10-CM | POA: Diagnosis not present

## 2020-05-08 DIAGNOSIS — L89152 Pressure ulcer of sacral region, stage 2: Secondary | ICD-10-CM | POA: Diagnosis not present

## 2020-05-08 DIAGNOSIS — N184 Chronic kidney disease, stage 4 (severe): Secondary | ICD-10-CM | POA: Diagnosis not present

## 2020-05-08 DIAGNOSIS — J90 Pleural effusion, not elsewhere classified: Secondary | ICD-10-CM | POA: Diagnosis not present

## 2020-05-12 ENCOUNTER — Telehealth: Payer: Self-pay

## 2020-05-12 NOTE — Telephone Encounter (Signed)
Pts wife called and stated that his weight is up to 163.3, pt is wheezing. Pt wont really get out of his chair. He does get winded sometimes when he gets up. His feet are swollen. Pts wife would like to know if we can up the dose of pts lasix.

## 2020-05-12 NOTE — Telephone Encounter (Signed)
Called and spoke to pt regarding lasix 20mg  bid.

## 2020-05-12 NOTE — Telephone Encounter (Signed)
Please have pt take Lasix 20 mg BID for 5 days. Repeat labs in 1 week.

## 2020-05-13 ENCOUNTER — Inpatient Hospital Stay (HOSPITAL_COMMUNITY)
Admission: EM | Admit: 2020-05-13 | Discharge: 2020-05-16 | DRG: 291 | Disposition: A | Discharge status: Hospice | Payer: Medicare Other | Attending: Internal Medicine | Admitting: Internal Medicine

## 2020-05-13 ENCOUNTER — Emergency Department (HOSPITAL_BASED_OUTPATIENT_CLINIC_OR_DEPARTMENT_OTHER): Payer: Medicare Other

## 2020-05-13 ENCOUNTER — Telehealth: Payer: Self-pay

## 2020-05-13 ENCOUNTER — Emergency Department (HOSPITAL_COMMUNITY): Payer: Medicare Other

## 2020-05-13 ENCOUNTER — Encounter (HOSPITAL_COMMUNITY): Payer: Self-pay

## 2020-05-13 ENCOUNTER — Observation Stay (HOSPITAL_COMMUNITY): Payer: Medicare Other

## 2020-05-13 ENCOUNTER — Other Ambulatory Visit: Payer: Self-pay

## 2020-05-13 DIAGNOSIS — I509 Heart failure, unspecified: Secondary | ICD-10-CM | POA: Diagnosis not present

## 2020-05-13 DIAGNOSIS — I442 Atrioventricular block, complete: Secondary | ICD-10-CM | POA: Diagnosis not present

## 2020-05-13 DIAGNOSIS — I5043 Acute on chronic combined systolic (congestive) and diastolic (congestive) heart failure: Secondary | ICD-10-CM | POA: Diagnosis not present

## 2020-05-13 DIAGNOSIS — Z20822 Contact with and (suspected) exposure to covid-19: Secondary | ICD-10-CM | POA: Diagnosis not present

## 2020-05-13 DIAGNOSIS — K219 Gastro-esophageal reflux disease without esophagitis: Secondary | ICD-10-CM | POA: Diagnosis not present

## 2020-05-13 DIAGNOSIS — I4819 Other persistent atrial fibrillation: Secondary | ICD-10-CM | POA: Diagnosis present

## 2020-05-13 DIAGNOSIS — J90 Pleural effusion, not elsewhere classified: Secondary | ICD-10-CM | POA: Diagnosis present

## 2020-05-13 DIAGNOSIS — R0602 Shortness of breath: Secondary | ICD-10-CM

## 2020-05-13 DIAGNOSIS — G4733 Obstructive sleep apnea (adult) (pediatric): Secondary | ICD-10-CM | POA: Diagnosis present

## 2020-05-13 DIAGNOSIS — Z950 Presence of cardiac pacemaker: Secondary | ICD-10-CM

## 2020-05-13 DIAGNOSIS — J9811 Atelectasis: Secondary | ICD-10-CM | POA: Diagnosis not present

## 2020-05-13 DIAGNOSIS — Z66 Do not resuscitate: Secondary | ICD-10-CM | POA: Diagnosis present

## 2020-05-13 DIAGNOSIS — J40 Bronchitis, not specified as acute or chronic: Secondary | ICD-10-CM | POA: Diagnosis present

## 2020-05-13 DIAGNOSIS — J209 Acute bronchitis, unspecified: Secondary | ICD-10-CM | POA: Diagnosis present

## 2020-05-13 DIAGNOSIS — R609 Edema, unspecified: Secondary | ICD-10-CM

## 2020-05-13 DIAGNOSIS — D72829 Elevated white blood cell count, unspecified: Secondary | ICD-10-CM | POA: Diagnosis not present

## 2020-05-13 DIAGNOSIS — F0390 Unspecified dementia without behavioral disturbance: Secondary | ICD-10-CM | POA: Diagnosis present

## 2020-05-13 DIAGNOSIS — N179 Acute kidney failure, unspecified: Secondary | ICD-10-CM | POA: Diagnosis not present

## 2020-05-13 DIAGNOSIS — I4891 Unspecified atrial fibrillation: Secondary | ICD-10-CM | POA: Diagnosis not present

## 2020-05-13 DIAGNOSIS — Z9111 Patient's noncompliance with dietary regimen: Secondary | ICD-10-CM

## 2020-05-13 DIAGNOSIS — Z8711 Personal history of peptic ulcer disease: Secondary | ICD-10-CM | POA: Diagnosis not present

## 2020-05-13 DIAGNOSIS — Z8249 Family history of ischemic heart disease and other diseases of the circulatory system: Secondary | ICD-10-CM | POA: Diagnosis not present

## 2020-05-13 DIAGNOSIS — N184 Chronic kidney disease, stage 4 (severe): Secondary | ICD-10-CM | POA: Diagnosis not present

## 2020-05-13 DIAGNOSIS — E785 Hyperlipidemia, unspecified: Secondary | ICD-10-CM | POA: Diagnosis not present

## 2020-05-13 DIAGNOSIS — Z886 Allergy status to analgesic agent status: Secondary | ICD-10-CM

## 2020-05-13 DIAGNOSIS — I10 Essential (primary) hypertension: Secondary | ICD-10-CM | POA: Diagnosis present

## 2020-05-13 DIAGNOSIS — R531 Weakness: Secondary | ICD-10-CM | POA: Diagnosis not present

## 2020-05-13 DIAGNOSIS — R778 Other specified abnormalities of plasma proteins: Secondary | ICD-10-CM

## 2020-05-13 DIAGNOSIS — Z83438 Family history of other disorder of lipoprotein metabolism and other lipidemia: Secondary | ICD-10-CM | POA: Diagnosis not present

## 2020-05-13 DIAGNOSIS — I130 Hypertensive heart and chronic kidney disease with heart failure and stage 1 through stage 4 chronic kidney disease, or unspecified chronic kidney disease: Principal | ICD-10-CM | POA: Diagnosis present

## 2020-05-13 DIAGNOSIS — N400 Enlarged prostate without lower urinary tract symptoms: Secondary | ICD-10-CM | POA: Diagnosis present

## 2020-05-13 DIAGNOSIS — Z8673 Personal history of transient ischemic attack (TIA), and cerebral infarction without residual deficits: Secondary | ICD-10-CM

## 2020-05-13 DIAGNOSIS — I110 Hypertensive heart disease with heart failure: Secondary | ICD-10-CM | POA: Diagnosis not present

## 2020-05-13 DIAGNOSIS — I5023 Acute on chronic systolic (congestive) heart failure: Secondary | ICD-10-CM | POA: Diagnosis not present

## 2020-05-13 DIAGNOSIS — I5042 Chronic combined systolic (congestive) and diastolic (congestive) heart failure: Secondary | ICD-10-CM

## 2020-05-13 DIAGNOSIS — I428 Other cardiomyopathies: Secondary | ICD-10-CM | POA: Diagnosis not present

## 2020-05-13 DIAGNOSIS — Z888 Allergy status to other drugs, medicaments and biological substances status: Secondary | ICD-10-CM

## 2020-05-13 DIAGNOSIS — Z79899 Other long term (current) drug therapy: Secondary | ICD-10-CM

## 2020-05-13 DIAGNOSIS — M7989 Other specified soft tissue disorders: Secondary | ICD-10-CM | POA: Diagnosis not present

## 2020-05-13 DIAGNOSIS — D649 Anemia, unspecified: Secondary | ICD-10-CM | POA: Diagnosis present

## 2020-05-13 DIAGNOSIS — Z7189 Other specified counseling: Secondary | ICD-10-CM | POA: Diagnosis not present

## 2020-05-13 DIAGNOSIS — I495 Sick sinus syndrome: Secondary | ICD-10-CM | POA: Diagnosis present

## 2020-05-13 DIAGNOSIS — I517 Cardiomegaly: Secondary | ICD-10-CM | POA: Diagnosis not present

## 2020-05-13 DIAGNOSIS — Z515 Encounter for palliative care: Secondary | ICD-10-CM | POA: Diagnosis not present

## 2020-05-13 LAB — CBC WITH DIFFERENTIAL/PLATELET
Abs Immature Granulocytes: 0.06 10*3/uL (ref 0.00–0.07)
Basophils Absolute: 0.1 10*3/uL (ref 0.0–0.1)
Basophils Relative: 1 %
Eosinophils Absolute: 0.8 10*3/uL — ABNORMAL HIGH (ref 0.0–0.5)
Eosinophils Relative: 7 %
HCT: 30.9 % — ABNORMAL LOW (ref 39.0–52.0)
Hemoglobin: 10 g/dL — ABNORMAL LOW (ref 13.0–17.0)
Immature Granulocytes: 1 %
Lymphocytes Relative: 36 %
Lymphs Abs: 3.9 10*3/uL (ref 0.7–4.0)
MCH: 30.8 pg (ref 26.0–34.0)
MCHC: 32.4 g/dL (ref 30.0–36.0)
MCV: 95.1 fL (ref 80.0–100.0)
Monocytes Absolute: 1 10*3/uL (ref 0.1–1.0)
Monocytes Relative: 9 %
Neutro Abs: 5.1 10*3/uL (ref 1.7–7.7)
Neutrophils Relative %: 46 %
Platelets: 286 10*3/uL (ref 150–400)
RBC: 3.25 MIL/uL — ABNORMAL LOW (ref 4.22–5.81)
RDW: 16.8 % — ABNORMAL HIGH (ref 11.5–15.5)
WBC: 10.9 10*3/uL — ABNORMAL HIGH (ref 4.0–10.5)
nRBC: 0 % (ref 0.0–0.2)

## 2020-05-13 LAB — URINALYSIS, ROUTINE W REFLEX MICROSCOPIC
Bacteria, UA: NONE SEEN
Bilirubin Urine: NEGATIVE
Glucose, UA: NEGATIVE mg/dL
Hgb urine dipstick: NEGATIVE
Ketones, ur: NEGATIVE mg/dL
Leukocytes,Ua: NEGATIVE
Nitrite: NEGATIVE
Protein, ur: 30 mg/dL — AB
Specific Gravity, Urine: 1.006 (ref 1.005–1.030)
pH: 5 (ref 5.0–8.0)

## 2020-05-13 LAB — RESP PANEL BY RT-PCR (FLU A&B, COVID) ARPGX2
Influenza A by PCR: NEGATIVE
Influenza B by PCR: NEGATIVE
SARS Coronavirus 2 by RT PCR: NEGATIVE

## 2020-05-13 LAB — COMPREHENSIVE METABOLIC PANEL
ALT: 22 U/L (ref 0–44)
AST: 37 U/L (ref 15–41)
Albumin: 2.5 g/dL — ABNORMAL LOW (ref 3.5–5.0)
Alkaline Phosphatase: 122 U/L (ref 38–126)
Anion gap: 9 (ref 5–15)
BUN: 57 mg/dL — ABNORMAL HIGH (ref 8–23)
CO2: 20 mmol/L — ABNORMAL LOW (ref 22–32)
Calcium: 8.2 mg/dL — ABNORMAL LOW (ref 8.9–10.3)
Chloride: 108 mmol/L (ref 98–111)
Creatinine, Ser: 2.63 mg/dL — ABNORMAL HIGH (ref 0.61–1.24)
GFR, Estimated: 23 mL/min — ABNORMAL LOW (ref >60)
Glucose, Bld: 99 mg/dL (ref 70–99)
Potassium: 4.3 mmol/L (ref 3.5–5.1)
Sodium: 137 mmol/L (ref 135–145)
Total Bilirubin: 0.6 mg/dL (ref 0.3–1.2)
Total Protein: 6.1 g/dL — ABNORMAL LOW (ref 6.5–8.1)

## 2020-05-13 LAB — PROCALCITONIN: Procalcitonin: 0.25 ng/mL

## 2020-05-13 LAB — BRAIN NATRIURETIC PEPTIDE: B Natriuretic Peptide: 677.4 pg/mL — ABNORMAL HIGH (ref 0.0–100.0)

## 2020-05-13 LAB — TROPONIN I (HIGH SENSITIVITY)
Troponin I (High Sensitivity): 31 ng/L — ABNORMAL HIGH (ref <18)
Troponin I (High Sensitivity): 31 ng/L — ABNORMAL HIGH (ref <18)

## 2020-05-13 MED ORDER — NIFEDIPINE ER OSMOTIC RELEASE 60 MG PO TB24
60.0000 mg | ORAL_TABLET | Freq: Every day | ORAL | Status: DC
  Administered 2020-05-13 – 2020-05-15 (×3): 60 mg via ORAL
  Filled 2020-05-13 (×4): qty 1

## 2020-05-13 MED ORDER — ALBUTEROL SULFATE (2.5 MG/3ML) 0.083% IN NEBU
2.5000 mg | INHALATION_SOLUTION | Freq: Two times a day (BID) | RESPIRATORY_TRACT | Status: DC
  Administered 2020-05-14 (×2): 2.5 mg via RESPIRATORY_TRACT
  Filled 2020-05-13 (×2): qty 3

## 2020-05-13 MED ORDER — TECHNETIUM TO 99M ALBUMIN AGGREGATED
4.2500 | Freq: Once | INTRAVENOUS | Status: AC | PRN
  Administered 2020-05-13: 4.25 via INTRAVENOUS

## 2020-05-13 MED ORDER — LACTULOSE 10 GM/15ML PO SOLN: 10.0000 g | Freq: Every day | ORAL | Status: DC | PRN

## 2020-05-13 MED ORDER — HYDRALAZINE HCL 50 MG PO TABS
50.0000 mg | ORAL_TABLET | Freq: Three times a day (TID) | ORAL | Status: DC
  Administered 2020-05-13 – 2020-05-16 (×8): 50 mg via ORAL
  Filled 2020-05-13 (×8): qty 1

## 2020-05-13 MED ORDER — METHYLPREDNISOLONE SODIUM SUCC 125 MG IJ SOLR
125.0000 mg | Freq: Once | INTRAMUSCULAR | Status: AC
  Administered 2020-05-13: 125 mg via INTRAVENOUS
  Filled 2020-05-13: qty 2

## 2020-05-13 MED ORDER — GUAIFENESIN ER 600 MG PO TB12
600.0000 mg | ORAL_TABLET | Freq: Two times a day (BID) | ORAL | Status: DC
  Administered 2020-05-13 – 2020-05-16 (×6): 600 mg via ORAL
  Filled 2020-05-13 (×7): qty 1

## 2020-05-13 MED ORDER — FUROSEMIDE 10 MG/ML IJ SOLN
20.0000 mg | Freq: Two times a day (BID) | INTRAMUSCULAR | Status: DC
  Administered 2020-05-13: 20 mg via INTRAVENOUS
  Filled 2020-05-13 (×2): qty 2

## 2020-05-13 MED ORDER — FUROSEMIDE 10 MG/ML IJ SOLN
20.0000 mg | Freq: Once | INTRAMUSCULAR | Status: AC
  Administered 2020-05-13: 20 mg via INTRAVENOUS
  Filled 2020-05-13: qty 2

## 2020-05-13 MED ORDER — METOPROLOL SUCCINATE ER 25 MG PO TB24
25.0000 mg | ORAL_TABLET | Freq: Every day | ORAL | Status: DC
  Administered 2020-05-14 – 2020-05-16 (×3): 25 mg via ORAL
  Filled 2020-05-13 (×3): qty 1

## 2020-05-13 MED ORDER — ACETAMINOPHEN 325 MG PO TABS: 650.0000 mg | ORAL_TABLET | ORAL | Status: DC | PRN

## 2020-05-13 MED ORDER — SODIUM CHLORIDE 0.9% FLUSH
3.0000 mL | Freq: Two times a day (BID) | INTRAVENOUS | Status: DC
  Administered 2020-05-13 – 2020-05-16 (×6): 3 mL via INTRAVENOUS

## 2020-05-13 MED ORDER — TAMSULOSIN HCL 0.4 MG PO CAPS
0.4000 mg | ORAL_CAPSULE | Freq: Every day | ORAL | Status: DC
  Administered 2020-05-13 – 2020-05-15 (×3): 0.4 mg via ORAL
  Filled 2020-05-13 (×3): qty 1

## 2020-05-13 MED ORDER — SODIUM CHLORIDE 0.9% FLUSH: 3.0000 mL | INTRAVENOUS | Status: DC | PRN

## 2020-05-13 MED ORDER — SIMVASTATIN 20 MG PO TABS
40.0000 mg | ORAL_TABLET | Freq: Every day | ORAL | Status: DC
  Administered 2020-05-13 – 2020-05-15 (×3): 40 mg via ORAL
  Filled 2020-05-13 (×3): qty 2

## 2020-05-13 MED ORDER — ISOSORBIDE DINITRATE 10 MG PO TABS
30.0000 mg | ORAL_TABLET | Freq: Three times a day (TID) | ORAL | Status: DC
  Administered 2020-05-13 – 2020-05-16 (×8): 30 mg via ORAL
  Filled 2020-05-13 (×8): qty 3

## 2020-05-13 MED ORDER — ONDANSETRON HCL 4 MG/2ML IJ SOLN: 4.0000 mg | Freq: Four times a day (QID) | INTRAMUSCULAR | Status: DC | PRN

## 2020-05-13 MED ORDER — ALBUTEROL SULFATE (2.5 MG/3ML) 0.083% IN NEBU
2.5000 mg | INHALATION_SOLUTION | Freq: Four times a day (QID) | RESPIRATORY_TRACT | Status: DC | PRN
  Administered 2020-05-14: 2.5 mg via RESPIRATORY_TRACT

## 2020-05-13 MED ORDER — HEPARIN SODIUM (PORCINE) 5000 UNIT/ML IJ SOLN
5000.0000 [IU] | Freq: Three times a day (TID) | INTRAMUSCULAR | Status: DC
  Administered 2020-05-13 – 2020-05-15 (×6): 5000 [IU] via SUBCUTANEOUS
  Filled 2020-05-13 (×6): qty 1

## 2020-05-13 MED ORDER — RISAQUAD PO CAPS
1.0000 | ORAL_CAPSULE | Freq: Every morning | ORAL | Status: DC
  Administered 2020-05-14 – 2020-05-16 (×3): 1 via ORAL
  Filled 2020-05-13 (×3): qty 1

## 2020-05-13 MED ORDER — LORATADINE 10 MG PO TABS
10.0000 mg | ORAL_TABLET | Freq: Every day | ORAL | Status: DC
  Administered 2020-05-13 – 2020-05-16 (×4): 10 mg via ORAL
  Filled 2020-05-13 (×4): qty 1

## 2020-05-13 MED ORDER — LATANOPROST 0.005 % OP SOLN
1.0000 [drp] | Freq: Every day | OPHTHALMIC | Status: DC
  Administered 2020-05-14 – 2020-05-15 (×3): 1 [drp] via OPHTHALMIC
  Filled 2020-05-13 (×2): qty 2.5

## 2020-05-13 MED ORDER — SODIUM CHLORIDE 0.9 % IV SOLN: 250.0000 mL | INTRAVENOUS | Status: DC | PRN

## 2020-05-13 NOTE — ED Triage Notes (Signed)
Pt was sent here by PCP due to weight gain. Pt had 2 lbs increase. Pt takes lasix. Pt started having sob & wheezing per wife.

## 2020-05-13 NOTE — ED Notes (Signed)
Ambulated pt around room. O2 sats between 92-94% the entire time.

## 2020-05-13 NOTE — ED Provider Notes (Signed)
[facility] EMERGENCY DEPARTMENT Provider Note   CSN: [id number] Arrival date & time: 05/13/20  1010     History Chief Complaint  Patient presents with  . Shortness of Breath    Brian Wolf is a 87 y.o. male.  HPI      87 year old male with a history of dementia, CVA, hypertension, hyperlipidemia, sinus node dysfunction with pacemaker, GI bleed, intracranial hemorrhage, diastolic congestive heart failure and nonischemic cardiomyopathy, recent admission 3/5-3/18 with GI bleeding related to duodenal ulcer, acute on chronic combined diastolic and systolic CHF, pleural effusion with thoracentesis, aspiration pneumonia, who presents with concern for shortness of breath. He has gained 6lb over the last 3-4 days with increasing dyspnea, orthopnea and wheezing, fatigue.  [facility] who recommended coming to the ED for care.  Shortness of breath began yesterday. Has chronic nonproductive cough, was producitve of clear sputum once yesterday. Has had increased shortness of breath and wheezing.  Leg swelling is worse, and particularly worse on the right. Asymmetry has been rpesent since he left the hospital, increased swelling over last few days.  No n/v/CP/black or bloody stools or other concerns. Is urinating normally but wife does not feel increased lasix helping.  Increased lasix yesterday by taking extra dose last night. Has gained 2lb since yesterday despite this increase.  She reports the shortness of breath did begin pretty acutely yesterday. Initially he was able to walk up and down ramp but suddenly he was unable to get up and move at all due to his dyspnea.  Past Medical History:  Diagnosis Date  . CVA (cerebral infarction) 12/2013  . Dementia ([place])   . Gastric ulcer    prior to 2000  . Hypertension   . Sleep difficulties     Patient Active Problem List   Diagnosis Date Noted  . CHF (congestive heart failure) ([place]) 05/13/2020  . Bronchitis  with acute wheezing 05/13/2020  . Leukocytosis 05/13/2020  . Elevated troponin 05/13/2020  . CKD (chronic kidney disease), stage IV ([place]) 05/13/2020  . DNR (do not resuscitate) 05/13/2020  . Sensorineural hearing loss, bilateral 05/01/2020  . Hyperlipidemia 05/01/2020  . Benign neoplasm of colon 05/01/2020  . Benign prostatic hyperplasia 05/01/2020  . Cardiomyopathy ([place]) 05/01/2020  . Dyslipidemia 05/01/2020  . Glaucoma 05/01/2020  . History of anemia 05/01/2020  . Hypertensive retinopathy 05/01/2020  . Malignant hypertensive chronic kidney disease 05/01/2020  . Personal history of other diseases of the digestive system 05/01/2020  . Sinus node dysfunction ([place]) 05/01/2020  . Malnutrition of moderate degree 04/14/2020  . SOB (shortness of breath)   . Bilateral lower extremity edema 04/05/2020  . Acute blood loss anemia 04/05/2020  . Pleural effusion 04/05/2020  . Acute diastolic CHF (congestive heart failure) ([place]) 04/05/2020  . Acute upper GI bleed 10/17/2019  . Hyperkalemia 10/17/2019  . Acute kidney injury superimposed on chronic kidney disease ([place]) 10/17/2019  . Fall   . Personal history of transient ischemic attack (TIA), and cerebral infarction without residual deficits 03/09/2016  . Obstructive sleep apnea syndrome 03/09/2016  . SAH (subarachnoid hemorrhage) ([place]) 03/09/2016  . Subarachnoid bleed ([place]) 03/08/2016  . Hypertension, essential   . Sleep difficulties   . Homonymous hemianopsia 01/14/2014  . Homonymous hemianopsia due to recent cerebral infarction 12/24/2013  . Left-sided neglect 12/24/2013  . Gout flare 12/20/2013  . Benign essential HTN 12/20/2013  . Acute hemorrhagic infarction of brain ([place]) 12/20/2013  . Hemorrhagic stroke ([place])   . Gout   .  Hypokalemia 12/19/2013  . Altered mental status   . Encounter for immunization   . Encephalopathy acute   . ICH (intracerebral hemorrhage) ([place]) 12/14/2013  . Fever     Past Surgical History:  Procedure  Laterality Date  . ABDOMINAL SURGERY    . BIOPSY  04/06/2020   Procedure: BIOPSY;  Surgeon: Ronald Lobo, MD;  Location: [facility];  Service: Endoscopy;;  . CATARACT EXTRACTION Left   . ESOPHAGOGASTRODUODENOSCOPY N/A 10/18/2019   Procedure: ESOPHAGOGASTRODUODENOSCOPY (EGD);  Surgeon: Wonda Horner, MD;  Location[facility] ENDOSCOPY;  Service: Endoscopy;  Laterality: N/A;  . ESOPHAGOGASTRODUODENOSCOPY (EGD) WITH PROPOFOL N/A 04/06/2020   Procedure: ESOPHAGOGASTRODUODENOSCOPY (EGD) WITH PROPOFOL;  Surgeon: Ronald Lobo, MD;  Location: [facility];  Service: Endoscopy;  Laterality: N/A;  . IR THORACENTESIS ASP PLEURAL SPACE W/IMG GUIDE  04/08/2020       Family History  Problem Relation Age of Onset  . Hyperlipidemia Mother   . Hypertension Mother   . Hypertension Father     Social History   Tobacco Use  . Smoking status: Never Smoker  . Smokeless tobacco: Never Used  Vaping Use  . Vaping Use: Never used  Substance Use Topics  . Alcohol use: No    Alcohol/week: 0.0 standard drinks  . Drug use: No    Home Medications Prior to Admission medications   Medication Sig Start Date End Date Taking? Authorizing Provider  fexofenadine (ALLEGRA) 180 MG tablet Take 180 mg by mouth in the morning.   Yes [provider]  furosemide (LASIX) 20 MG tablet Take 1 tablet (20 mg total) by mouth every other day. Patient taking differently: Take 20 mg by mouth in the morning and at bedtime. 04/18/20  Yes Kathie Dike, MD  hydrALAZINE (APRESOLINE) 50 MG tablet Take 1 tablet (50 mg total) by mouth 3 (three) times daily. 11/21/19  Yes Cantwell, Celeste C, PA-C  isosorbide dinitrate (ISORDIL) 30 MG tablet Take 1 tablet (30 mg total) by mouth 3 (three) times daily. 09/03/19  Yes Adrian Prows, MD  lactulose (CHRONULAC) 10 GM/15ML solution Take 15 mLs (10 g total) by mouth daily as needed for mild constipation. 04/18/20  Yes Kathie Dike, MD  metoprolol succinate (TOPROL-XL) 25 MG 24 hr tablet Take  1 tablet (25 mg total) by mouth daily. 04/19/20  Yes Kathie Dike, MD  NIFEdipine (ADALAT CC) 60 MG 24 hr tablet Take 60 mg by mouth at bedtime.   Yes [provider]  pantoprazole (PROTONIX) 40 MG tablet Take 1 tablet (40 mg total) by mouth 2 (two) times daily before a meal. 10/22/19  Yes Shawna Clamp, MD  Probiotic Product (PROBIOTIC DAILY PO) Take 1 capsule by mouth in the morning.   Yes [provider]  simvastatin (ZOCOR) 40 MG tablet Take 40 mg by mouth at bedtime. 11/30/13  Yes [provider]  tamsulosin (FLOMAX) 0.4 MG CAPS capsule Take 0.4 mg by mouth daily after supper. 06/03/16  Yes [provider]  Travoprost, BAK Free, (TRAVATAN) 0.004 % SOLN ophthalmic solution Place 1 drop into both eyes every evening.   Yes [provider]  potassium chloride SA (KLOR-CON) 20 MEQ tablet Take 1 tablet (20 mEq total) by mouth daily as needed (With furosemide). 05/05/20   Cantwell, Celeste C, PA-C    Allergies    Lisinopril and Nsaids  Review of Systems   Review of Systems  Constitutional: Positive for fatigue. Negative for fever.  HENT: Negative for sore throat.   Eyes: Negative for visual disturbance.  Respiratory: Positive for cough and shortness of breath.   Cardiovascular: Positive for leg swelling. Negative for chest pain.  Gastrointestinal: Negative for abdominal pain, nausea and vomiting.  Genitourinary: Negative for difficulty urinating.  Musculoskeletal: Negative for back pain and neck stiffness.  Skin: Negative for rash.  Neurological: Negative for syncope and headaches.    Physical Exam Updated Vital Signs BP (!) 123/46 (BP Location: Right Arm)   Pulse 72   Temp 98.1 F (36.7 C) (Oral)   Resp 18   Ht 5\' 4"  (1.626 m)   Wt 74.6 kg   SpO2 94%   BMI 28.23 kg/m   Physical Exam Vitals and nursing note reviewed.  Constitutional:      General: He is not in acute distress.    Appearance: He is well-developed. He is not  diaphoretic.  HENT:     Head: Normocephalic and atraumatic.  Eyes:     Conjunctiva/sclera: Conjunctivae normal.  Neck:     Vascular: JVD present.  Cardiovascular:     Rate and Rhythm: Normal rate and regular rhythm.     Heart sounds: Normal heart sounds. No murmur heard. No friction rub. No gallop.   Pulmonary:     Effort: Pulmonary effort is normal. No respiratory distress.     Breath sounds: Wheezing present. No rales.  Abdominal:     General: There is no distension.     Palpations: Abdomen is soft.     Tenderness: There is no abdominal tenderness. There is no guarding.  Musculoskeletal:     Cervical back: Normal range of motion.     Right lower leg: Edema (right slightly greater than left) present.     Left lower leg: Edema present.  Skin:    General: Skin is warm and dry.  Neurological:     Mental Status: He is alert and oriented to person, place, and time.     ED Results / Procedures / Treatments   Labs (all labs ordered are listed, but only abnormal results are displayed) Labs Reviewed  BRAIN NATRIURETIC PEPTIDE - Abnormal; Notable for the following components:      Result Value   B Natriuretic Peptide 677.4 (*)    All other components within normal limits  CBC WITH DIFFERENTIAL/PLATELET - Abnormal; Notable for the following components:   WBC 10.9 (*)    RBC 3.25 (*)    Hemoglobin 10.0 (*)    HCT 30.9 (*)    RDW 16.8 (*)    Eosinophils Absolute 0.8 (*)    All other components within normal limits  COMPREHENSIVE METABOLIC PANEL - Abnormal; Notable for the following components:   CO2 20 (*)    BUN 57 (*)    Creatinine, Ser 2.63 (*)    Calcium 8.2 (*)    Total Protein 6.1 (*)    Albumin 2.5 (*)    GFR, Estimated 23 (*)    All other components within normal limits  URINALYSIS, ROUTINE W REFLEX MICROSCOPIC - Abnormal; Notable for the following components:   Color, Urine STRAW (*)    Protein, ur 30 (*)    All other components within normal limits  TROPONIN I  (HIGH SENSITIVITY) - Abnormal; Notable for the following components:   Troponin I (High Sensitivity) 31 (*)    All other components within normal limits  TROPONIN I (HIGH SENSITIVITY) - Abnormal; Notable for the following components:   Troponin I (High Sensitivity) 31 (*)    All other components within normal limits  RESP PANEL  BY RT-PCR (FLU A&B, COVID) [id number]  PROCALCITONIN  BASIC METABOLIC PANEL  CBC    EKG EKG Interpretation  Date/Time:  Tuesday May 13 2020 11:26:02 EDT Ventricular Rate:  66 PR Interval:    QRS Duration: 101 QT Interval:  388 QTC Calculation: 407 R Axis:   24 Text Interpretation: Normal sinus rhythm Anterior infarct, old Nonspecific T abnormalities, lateral leads Similar ST abnormality lateral leads to previous Confirmed by Gareth Morgan [phone]) on 05/13/2020 12:55:57 PM   Radiology DG Chest 2 View  Result Date: 05/13/2020 CLINICAL DATA:  Shortness of breath. EXAM: CHEST - 2 VIEW COMPARISON:  Jul 01, 2020. FINDINGS: Stable cardiomegaly. No pneumothorax is noted. Stable mild-to-moderate right-sided pleural effusion is noted with associated right basilar atelectasis or infiltrate. Minimal left basilar subsegmental atelectasis is noted with small left pleural effusion. Bony thorax is unremarkable. IMPRESSION: Stable moderate size right pleural effusion is noted with associated atelectasis or infiltrate. Stable left basilar opacities as well. Electronically Signed   By: Marijo Conception M.D.   On: 05/13/2020 11:55   NM PULMONARY VENT AND PERF (V/Q Scan)  Result Date: 05/13/2020 CLINICAL DATA:  PE suspected, high prob Lower extremity swelling, dyspnea and wheezing. EXAM: NUCLEAR MEDICINE PERFUSION LUNG SCAN TECHNIQUE: Perfusion images were obtained in multiple projections after intravenous injection of radiopharmaceutical. Ventilation scans intentionally deferred if perfusion scan and chest x-ray adequate for interpretation during COVID 19 epidemic.  RADIOPHARMACEUTICALS:  4.25 mCi Tc-47m MAA IV COMPARISON:  Radiograph earlier today. FINDINGS: No peripheral wedge-shaped perfusion defects to suggest pulmonary embolus. Slight volume loss in the right hemithorax is due to right pleural effusion. Linear area of photopenia in the right lung likely corresponds to fluid in the fissure when correlated with radiograph. IMPRESSION: No scintigraphic findings of pulmonary embolus. Electronically Signed   By: Keith Rake M.D.   On: 05/13/2020 17:15   VAS [name] LOWER EXTREMITY VENOUS (DVT) (ONLY [facility] & [facility])  Result Date: 05/13/2020  Lower Venous DVT Study Indications: Edema.  Comparison Study: no prior Performing Technologist: Abram Sander RVS  Examination Guidelines: A complete evaluation includes B-mode imaging, spectral Doppler, color Doppler, and power Doppler as needed of all accessible portions of each vessel. Bilateral testing is considered an integral part of a complete examination. Limited examinations for reoccurring indications may be performed as noted. The reflux portion of the exam is performed with the patient in reverse Trendelenburg.  +---------+---------------+---------+-----------+----------+--------------+ RIGHT    CompressibilityPhasicitySpontaneityPropertiesThrombus Aging +---------+---------------+---------+-----------+----------+--------------+ CFV      Full           Yes      Yes                                 +---------+---------------+---------+-----------+----------+--------------+ SFJ      Full                                                        +---------+---------------+---------+-----------+----------+--------------+ FV Prox  Full                                                        +---------+---------------+---------+-----------+----------+--------------+  FV Mid   Full                                                        +---------+---------------+---------+-----------+----------+--------------+  FV DistalFull                                                        +---------+---------------+---------+-----------+----------+--------------+ PFV      Full                                                        +---------+---------------+---------+-----------+----------+--------------+ POP      Full           Yes      Yes                                 +---------+---------------+---------+-----------+----------+--------------+ PTV      Full                                                        +---------+---------------+---------+-----------+----------+--------------+ PERO     Full                                                        +---------+---------------+---------+-----------+----------+--------------+   +----+---------------+---------+-----------+----------+-------------------+ LEFTCompressibilityPhasicitySpontaneityPropertiesThrombus Aging      +----+---------------+---------+-----------+----------+-------------------+ CFV                                              Not well visualized +----+---------------+---------+-----------+----------+-------------------+     Summary: RIGHT: - There is no evidence of deep vein thrombosis in the lower extremity.  - No cystic structure found in the popliteal fossa.   *See table(s) above for measurements and observations. Electronically signed by Deitra Mayo MD on 05/13/2020 at 1:50:03 PM.    Final     Procedures Procedures   Medications Ordered in ED Medications  sodium chloride flush (NS) 0.9 % injection 3 mL (3 mLs Intravenous Given 05/13/20 2143)  sodium chloride flush (NS) 0.9 % injection 3 mL (has no administration in time range)  0.9 %  sodium chloride infusion (has no administration in time range)  acetaminophen (TYLENOL) tablet 650 mg (has no administration in time range)  ondansetron (ZOFRAN) injection 4 mg (has no administration in time range)  heparin injection 5,000 Units (5,000 Units  Subcutaneous Given 05/14/20 0143)  furosemide (LASIX) injection 20 mg (20 mg Intravenous Given 05/13/20 1839)  albuterol (PROVENTIL) (2.5 MG/3ML) 0.083% nebulizer solution 2.5 mg (2.5 mg Nebulization Given 05/14/20  0144)  albuterol (PROVENTIL) (2.5 MG/3ML) 0.083% nebulizer solution 2.5 mg (has no administration in time range)  guaiFENesin (MUCINEX) 12 hr tablet 600 mg (600 mg Oral Given 05/13/20 2142)  hydrALAZINE (APRESOLINE) tablet 50 mg (50 mg Oral Given 05/13/20 2142)  isosorbide dinitrate (ISORDIL) tablet 30 mg (30 mg Oral Given 05/13/20 2142)  metoprolol succinate (TOPROL-XL) 24 hr tablet 25 mg (has no administration in time range)  NIFEdipine (PROCARDIA XL/NIFEDICAL XL) 24 hr tablet 60 mg (60 mg Oral Given 05/13/20 2141)  simvastatin (ZOCOR) tablet 40 mg (40 mg Oral Given 05/13/20 2142)  acidophilus (RISAQUAD) capsule 1 capsule (1 capsule Oral Given 05/14/20 0612)  lactulose (CHRONULAC) 10 GM/15ML solution 10 g (has no administration in time range)  tamsulosin (FLOMAX) capsule 0.4 mg (0.4 mg Oral Given 05/13/20 2146)  loratadine (CLARITIN) tablet 10 mg (10 mg Oral Given 05/13/20 2146)  latanoprost (XALATAN) 0.005 % ophthalmic solution 1 drop (1 drop Both Eyes Given 05/14/20 0143)  furosemide (LASIX) injection 20 mg (20 mg Intravenous Given 05/13/20 1215)  methylPREDNISolone sodium succinate (SOLU-MEDROL) 125 mg/2 mL injection 125 mg (125 mg Intravenous Given 05/13/20 1838)  technetium albumin aggregated (MAA) injection solution 9.44 millicurie (9.67 millicuries Intravenous Contrast Given 05/13/20 1706)    ED Course  I have reviewed the triage vital signs and the nursing notes.  Pertinent labs & imaging results that were available during my care of the patient were reviewed by me and considered in my medical decision making (see chart for details).    MDM Rules/Calculators/A&P                          87 year old male with a history of dementia, CVA, hypertension, hyperlipidemia, sinus node  dysfunction with pacemaker, GI bleed, intracranial hemorrhage, diastolic congestive heart failure and nonischemic cardiomyopathy, recent admission 3/5-3/18 with GI bleeding related to duodenal ulcer, acute on chronic combined diastolic and systolic CHF, pleural effusion with thoracentesis, aspiration pneumonia, who presents with concern for shortness of breath and gaining additional 2lb despite increasing lasix from 20mg  once daily to 20mg  BID.  Differential diagnosis for dyspnea includes ACS, PE, COPD exacerbation, CHF exacerbation, anemia, pneumonia, viral etiology such as COVID 19 infection, metabolic abnormality.  Chest x-ray was done which showed pleural effusion, opacity unchanged from prior. EKG was evaluated by me which showed no acute findings.  Denies CP, troponin stable at 31, doubt ACS.  Exam and history overall consistent with worsening CHF, given lasix 20mg .  Given asymmetry of swelling, right vascular US ordered showing no sign of DVT. BNP was 600s, decreased from recent admission however more than it had been in early March.  Do feel hx consistent with CHF although XR do not show worsening from recent discharge.  He continues to be tachypneic after lasix administration.  Wheezing on exam without history of RAD/asthma, consider other bronchitis vs cardiac wheeze. Discussed with hospitalist also possibility of PE given recent hospitalization and sudden onset of dyspnea yesterday and Cr elevation and feel VQ scan appropriate although recognize the limitations with pleural effusion present. Ordered and admitted for further care.     Final Clinical Impression(s) / ED Diagnoses Final diagnoses:  SOB (shortness of breath)  Acute on chronic congestive heart failure, unspecified heart failure type [facility])    Rx / DC Orders ED Discharge Orders    None       Gareth Morgan, MD 05/14/20 [phone]

## 2020-05-13 NOTE — Progress Notes (Signed)
RT note. Pt. Refused CPAP tonight, RN made aware, RT will continue to monitor

## 2020-05-13 NOTE — Progress Notes (Signed)
Lower extremity venous has been completed.   Preliminary results in CV Proc.   Brian Wolf 05/13/2020 11:52 AM

## 2020-05-13 NOTE — Telephone Encounter (Signed)
Patient wife called to say he has gained an additional 2 pounds since yesterday

## 2020-05-13 NOTE — ED Triage Notes (Signed)
Pt states he " he came to see the heart doctor, to check my heart and kidneys". Denies shOB, Chest pain, 0/10 pain, denies, N/V, HA. Pt arrives with audible wheezes and swollen extremities.   Family member arrived after triage process started. Per wife, pt  Gained 2lbs yesterday and doctor started him on new medication. States pt seems to be short or breath.

## 2020-05-13 NOTE — Telephone Encounter (Signed)
Patient's wife called stating that patient has gained an additional 2 pounds despite increasing Lasix from 20 mg once daily to 20 mg twice daily.  This makes for a total of 6 pounds gained in the last 3-4 days.  He is now experiencing significant dyspnea, orthopnea, and wheezing.  He has bilateral lower extremity swelling, right worse than left.  Patient's wife states patient is extremely fatigued and somnolent, he is difficult to arouse.  Discussed at length with patient's wife regarding inpatient versus outpatient management.  In view of patient's significant symptoms as well as worsening renal function since recent hospitalization shared decision was to proceed with evalaution in the emergency department likely for inpatient admission of acute on chronic heart failure.  Patient's wife will transport him to the ED and we will consult when contacted by ED providers.     ICD-10-CM   1. Chronic combined systolic and diastolic heart failure ([place])  I50.42      Alethia Berthold, PA-C 05/13/2020, 9:00 AM Office: [phone]

## 2020-05-13 NOTE — ED Notes (Signed)
Patient transported to Nuclear Medicine 

## 2020-05-13 NOTE — H&P (Addendum)
History and Physical    Torell Minder WUJ:[id number] DOB: February 18, 1931 DOA: 05/13/2020  Referring MD/NP/PA: Gareth Morgan, MD PCP: [name], DO  Consultants: Dr. Charolotte Capuchin, [place] - cardiology Patient coming from: Home  Chief Complaint: Shortness of breath  I have personally briefly reviewed patient's old medical records in [place]   HPI: Darien [employer] is a 87 y.o. male with medical history significant of hypertension, CVA, sinus node dysfunction s/p pacemaker, CKD stage IV, GI bleed secondary to history of duodenal ulcer, and anemia presents with complaints of shortness of breath and weight gain.  History is obtained with the assistance of his significant other present at bedside.  Patient just recently hospitalized from 3/5-3/18 with a GI bleed possibly related to a duodenal ulcer transfused 2 units of packed red blood cells.  There was concern for aspiration pneumonia along with a Proteus urinary tract infection.  Since getting home patient had slowly been getting more weak.  Over the last 4 days he had gained at least 4 pounds.  Patient had been sleeping in a chair stating that it felt more comfortable to do so for at least 2 weeks.  Associated symptoms include right lower extremity swelling, wheezing, and intermittently productive cough with clear sputum production.  Denies any recent sick contacts, fever, nausea, vomiting, abdominal pain, dark stools, or chest pain.  They called his cardiologist office yesterday and was advised to take furosemide 20 mg twice daily which he did, but overnight patient gained an additional 2 pounds.  Due to the worsening of his breathing is brought to the hospital for further evaluation.  ED Course: Upon admission into the emergency department patient was seen to be afebrile with respirations 18-29, blood pressures maintained, and O2 saturations 88-96% on room air.  Labs significant for WBC 10.9, hemoglobin 10, BUN 57, creatinine  2.63, albumin 2.5, BNP 677.4, and high-sensitivity troponin 31->31.  Doppler ultrasound of the right lower extremity showed no signs of a DVT.  VQ scan was performed due to the concern for the possibility of a pulmonary embolus.  Review of Systems  Constitutional: Positive for malaise/fatigue. Negative for fever.  HENT: Negative for nosebleeds and sore throat.   Eyes: Negative for double vision and photophobia.  Respiratory: Positive for cough, sputum production, shortness of breath and wheezing.   Cardiovascular: Positive for orthopnea and leg swelling. Negative for chest pain.  Gastrointestinal: Negative for abdominal pain, blood in stool, nausea and vomiting.  Genitourinary: Negative for dysuria and hematuria.  Musculoskeletal: Negative for falls.  Neurological: Positive for weakness. Negative for focal weakness and loss of consciousness.  Psychiatric/Behavioral: Negative for substance abuse.  All other systems reviewed and are negative.   Past Medical History:  Diagnosis Date  . CVA (cerebral infarction) 12/2013  . Dementia ([place])   . Gastric ulcer    prior to 2000  . Hypertension   . Sleep difficulties     Past Surgical History:  Procedure Laterality Date  . ABDOMINAL SURGERY    . BIOPSY  04/06/2020   Procedure: BIOPSY;  Surgeon: Ronald Lobo, MD;  Location: [facility];  Service: Endoscopy;;  . CATARACT EXTRACTION Left   . ESOPHAGOGASTRODUODENOSCOPY N/A 10/18/2019   Procedure: ESOPHAGOGASTRODUODENOSCOPY (EGD);  Surgeon: Wonda Horner, MD;  Location: [facility] ENDOSCOPY;  Service: Endoscopy;  Laterality: N/A;  . ESOPHAGOGASTRODUODENOSCOPY (EGD) WITH PROPOFOL N/A 04/06/2020   Procedure: ESOPHAGOGASTRODUODENOSCOPY (EGD) WITH PROPOFOL;  Surgeon: Ronald Lobo, MD;  Location: [place];  Service: Endoscopy;  Laterality: N/A;  .  IR THORACENTESIS ASP PLEURAL SPACE W/IMG GUIDE  04/08/2020     reports that he has never smoked. He has never used smokeless tobacco. He reports that he  does not drink alcohol and does not use drugs.  Allergies  Allergen Reactions  . Lisinopril     Other reaction(s): COUGHING  . Nsaids     Other reaction(s): GI bleed    Family History  Problem Relation Age of Onset  . Hyperlipidemia Mother   . Hypertension Mother   . Hypertension Father     Prior to Admission medications   Medication Sig Start Date End Date Taking? Authorizing Provider  fexofenadine (ALLEGRA) 180 MG tablet 1 tablet    [provider]  furosemide (LASIX) 20 MG tablet Take 1 tablet (20 mg total) by mouth every other day. 04/18/20   Kathie Dike, MD  hydrALAZINE (APRESOLINE) 50 MG tablet Take 1 tablet (50 mg total) by mouth 3 (three) times daily. 11/21/19   Cantwell, Celeste C, PA-C  hydrochlorothiazide (HYDRODIURIL) 25 MG tablet Take 1 tablet by mouth daily. 07/16/09   [provider]  isosorbide dinitrate (ISORDIL) 30 MG tablet Take 1 tablet (30 mg total) by mouth 3 (three) times daily. 09/03/19   Adrian Prows, MD  lactulose (CHRONULAC) 10 GM/15ML solution Take 15 mLs (10 g total) by mouth daily as needed for mild constipation. 04/18/20   Kathie Dike, MD  metoprolol succinate (TOPROL-XL) 25 MG 24 hr tablet Take 1 tablet (25 mg total) by mouth daily. 04/19/20   Kathie Dike, MD  NIFEdipine (ADALAT CC) 60 MG 24 hr tablet 1 tablet    [provider]  pantoprazole (PROTONIX) 40 MG tablet Take 1 tablet (40 mg total) by mouth 2 (two) times daily before a meal. 10/22/19   Shawna Clamp, MD  potassium chloride SA (KLOR-CON) 20 MEQ tablet Take 1 tablet (20 mEq total) by mouth daily as needed (With furosemide). 05/05/20   Cantwell, Celeste C, PA-C  Probiotic Product (PROBIOTIC DAILY PO) Take 1 capsule by mouth daily.     [provider]  simvastatin (ZOCOR) 40 MG tablet Take 40 mg by mouth daily. 11/30/13   [provider]  tamsulosin (FLOMAX) 0.4 MG CAPS capsule Take 0.4 mg by mouth daily. 30 minutes after each meal 06/03/16   [provider]  Travoprost, BAK Free, (TRAVATAN) 0.004 % SOLN ophthalmic solution 1 drop into affected eye in the evening    [provider]    Physical Exam:  Constitutional: Elderly male currently in no acute distress Vitals:   05/13/20 1330 05/13/20 1400 05/13/20 1415 05/13/20 1430  BP: 131/60 (!) 131/53 (!) 127/57 138/62  Pulse: 64 65 66 72  Resp: (!) 23 (!) 27 (!) 24 (!) 29  Temp:      TempSrc:      SpO2: 93% 93% 94% 95%  Weight:      Height:       Eyes: PERRL, lids and conjunctivae normal ENMT: Mucous membranes are moist. Posterior pharynx clear of any exudate or lesions.  Neck: normal, supple, no masses, no thyromegaly.  Positive JVD. Respiratory: Mildly tachypneic with expiratory wheezes heard in both lung fields.  Patient currently maintaining O2 saturations on room air. Cardiovascular: Regular rate and rhythm, no murmurs / rubs / gallops.  At least 2+ pitting bilateral extremity edema. 2+ pedal pulses. No carotid bruits.  Abdomen: no tenderness, no masses palpated. No hepatosplenomegaly. Bowel sounds positive.  Musculoskeletal: no clubbing / cyanosis. No joint deformity upper  and lower extremities. Good ROM, no contractures. Normal muscle tone.  Skin: no rashes, lesions, ulcers. No induration Neurologic: CN 2-12 grossly intact. Sensation intact, DTR normal. Strength 5/5 in all 4.  Psychiatric: Normal judgment and insight. Alert and oriented x 3. Normal mood.     Labs on Admission: I have personally reviewed following labs and imaging studies  CBC: Recent Labs  Lab 05/13/20 1056  WBC 10.9*  NEUTROABS 5.1  HGB 10.0*  HCT 30.9*  MCV 95.1  PLT 244   Basic Metabolic Panel: Recent Labs  Lab 05/13/20 1056  NA 137  K 4.3  CL 108  CO2 20*  GLUCOSE 99  BUN 57*  CREATININE 2.63*  CALCIUM 8.2*   GFR: Estimated Creatinine Clearance: 18.7 mL/min (A) (by C-G formula based on SCr of 2.63 mg/dL (H)). Liver Function Tests: Recent Labs  Lab 05/13/20 1056   AST 37  ALT 22  ALKPHOS 122  BILITOT 0.6  PROT 6.1*  ALBUMIN 2.5*   No results for input(s): LIPASE, AMYLASE in the last 168 hours. No results for input(s): AMMONIA in the last 168 hours. Coagulation Profile: No results for input(s): INR, PROTIME in the last 168 hours. Cardiac Enzymes: No results for input(s): CKTOTAL, CKMB, CKMBINDEX, TROPONINI in the last 168 hours. BNP (last 3 results) No results for input(s): PROBNP in the last 8760 hours. HbA1C: No results for input(s): HGBA1C in the last 72 hours. CBG: No results for input(s): GLUCAP in the last 168 hours. Lipid Profile: No results for input(s): CHOL, HDL, LDLCALC, TRIG, CHOLHDL, LDLDIRECT in the last 72 hours. Thyroid Function Tests: No results for input(s): TSH, T4TOTAL, FREET4, T3FREE, THYROIDAB in the last 72 hours. Anemia Panel: No results for input(s): VITAMINB12, FOLATE, FERRITIN, TIBC, IRON, RETICCTPCT in the last 72 hours. Urine analysis:    Component Value Date/Time   COLORURINE YELLOW 04/08/2020 1657   APPEARANCEUR HAZY (A) 04/08/2020 1657   LABSPEC 1.011 04/08/2020 1657   PHURINE 7.0 04/08/2020 1657   GLUCOSEU NEGATIVE 04/08/2020 1657   HGBUR NEGATIVE 04/08/2020 1657   BILIRUBINUR NEGATIVE 04/08/2020 1657   KETONESUR NEGATIVE 04/08/2020 1657   PROTEINUR 100 (A) 04/08/2020 1657   UROBILINOGEN 0.2 12/16/2013 0929   NITRITE POSITIVE (A) 04/08/2020 1657   LEUKOCYTESUR MODERATE (A) 04/08/2020 1657   Sepsis Labs: Recent Results (from the past 240 hour(s))  Resp Panel by RT-PCR (Flu A&B, Covid) Nasopharyngeal Swab     Status: None   Collection Time: 05/13/20 10:56 AM   Specimen: Nasopharyngeal Swab; Nasopharyngeal(NP) swabs in vial transport medium  Result Value Ref Range Status   SARS Coronavirus 2 by RT PCR NEGATIVE NEGATIVE Final    Comment: (NOTE) SARS-CoV-2 target nucleic acids are NOT DETECTED.  The SARS-CoV-2 RNA is generally detectable in upper respiratory specimens during the acute phase of  infection. The lowest concentration of SARS-CoV-2 viral copies this assay can detect is 138 copies/mL. A negative result does not preclude SARS-Cov-2 infection and should not be used as the sole basis for treatment or other patient management decisions. A negative result may occur with  improper specimen collection/handling, submission of specimen other than nasopharyngeal swab, presence of viral mutation(s) within the areas targeted by this assay, and inadequate number of viral copies(<138 copies/mL). A negative result must be combined with clinical observations, patient history, and epidemiological information. The expected result is Negative.  Fact Sheet for Patients:  EntrepreneurPulse.com.au  Fact Sheet for Healthcare Providers:  IncredibleEmployment.be  This test is no t yet approved or cleared  by the [place] and  has been authorized for detection and/or diagnosis of SARS-CoV-2 by FDA under an Emergency Use Authorization (EUA). This EUA will remain  in effect (meaning this test can be used) for the duration of the COVID-19 declaration under Section 564(b)(1) of the Act, 21 U.S.C.section 360bbb-3(b)(1), unless the authorization is terminated  or revoked sooner.       Influenza A by PCR NEGATIVE NEGATIVE Final   Influenza B by PCR NEGATIVE NEGATIVE Final    Comment: (NOTE) The Xpert Xpress SARS-CoV-2/FLU/RSV plus assay is intended as an aid in the diagnosis of influenza from Nasopharyngeal swab specimens and should not be used as a sole basis for treatment. Nasal washings and aspirates are unacceptable for Xpert Xpress SARS-CoV-2/FLU/RSV testing.  Fact Sheet for Patients: EntrepreneurPulse.com.au  Fact Sheet for Healthcare Providers: IncredibleEmployment.be  This test is not yet approved or cleared by the [place] FDA and has been authorized for detection and/or diagnosis of SARS-CoV-2  by FDA under an Emergency Use Authorization (EUA). This EUA will remain in effect (meaning this test can be used) for the duration of the COVID-19 declaration under Section 564(b)(1) of the Act, 21 U.S.C. section 360bbb-3(b)(1), unless the authorization is terminated or revoked.  Performed at [facility], [place] [address], [place], [place] [address]      Radiological Exams on Admission: DG Chest 2 View  Result Date: 05/13/2020 CLINICAL DATA:  Shortness of breath. EXAM: CHEST - 2 VIEW COMPARISON:  Jul 01, 2020. FINDINGS: Stable cardiomegaly. No pneumothorax is noted. Stable mild-to-moderate right-sided pleural effusion is noted with associated right basilar atelectasis or infiltrate. Minimal left basilar subsegmental atelectasis is noted with small left pleural effusion. Bony thorax is unremarkable. IMPRESSION: Stable moderate size right pleural effusion is noted with associated atelectasis or infiltrate. Stable left basilar opacities as well. Electronically Signed   By: Marijo Conception M.D.   On: 05/13/2020 11:55   VAS [name] LOWER EXTREMITY VENOUS (DVT) (ONLY [facility] & [facility])  Result Date: 05/13/2020  Lower Venous DVT Study Indications: Edema.  Comparison Study: no prior Performing Technologist: Abram Sander RVS  Examination Guidelines: A complete evaluation includes B-mode imaging, spectral Doppler, color Doppler, and power Doppler as needed of all accessible portions of each vessel. Bilateral testing is considered an integral part of a complete examination. Limited examinations for reoccurring indications may be performed as noted. The reflux portion of the exam is performed with the patient in reverse Trendelenburg.  +---------+---------------+---------+-----------+----------+--------------+ RIGHT    CompressibilityPhasicitySpontaneityPropertiesThrombus Aging +---------+---------------+---------+-----------+----------+--------------+ CFV      Full           Yes      Yes                                  +---------+---------------+---------+-----------+----------+--------------+ SFJ      Full                                                        +---------+---------------+---------+-----------+----------+--------------+ FV Prox  Full                                                        +---------+---------------+---------+-----------+----------+--------------+  FV Mid   Full                                                        +---------+---------------+---------+-----------+----------+--------------+ FV DistalFull                                                        +---------+---------------+---------+-----------+----------+--------------+ PFV      Full                                                        +---------+---------------+---------+-----------+----------+--------------+ POP      Full           Yes      Yes                                 +---------+---------------+---------+-----------+----------+--------------+ PTV      Full                                                        +---------+---------------+---------+-----------+----------+--------------+ PERO     Full                                                        +---------+---------------+---------+-----------+----------+--------------+   +----+---------------+---------+-----------+----------+-------------------+ LEFTCompressibilityPhasicitySpontaneityPropertiesThrombus Aging      +----+---------------+---------+-----------+----------+-------------------+ CFV                                              Not well visualized +----+---------------+---------+-----------+----------+-------------------+     Summary: RIGHT: - There is no evidence of deep vein thrombosis in the lower extremity.  - No cystic structure found in the popliteal fossa.   *See table(s) above for measurements and observations. Electronically signed by Deitra Mayo MD on  05/13/2020 at 1:50:03 PM.    Final     EKG: Independently reviewed.  Normal sinus rhythm at 66 bpm with similar appearance to previous EKG.  Assessment/Plan Combined systolic and diastolic CHF exacerbation right-sided pleural effusion: Acute on chronic.  Patient presents with complaints of a shortness of breath and weight gain.  X-ray noting a moderate right-sided pleural effusion with atelectasis and/or infiltrate.  BNP elevated at 677.4, but appears had previously been higher in the past.  Last echocardiogram from 04/07/2018 revealed EF of 35 to 40% with grade 1 diastolic dysfunction. -Admit to a cardiac telemetry bed -Strict I&Os and daily weights -Lasix 20 mg IV twice daily -Reassess in a.m. and determine if further diuresis is warranted [facility] cardiology consulted, will follow-up  for any further recommendation  Bronchitis with wheezing: Acute.  Patient had been complaining of worsening shortness of breath.  Found to be actively wheezing on physical exam with reports of intermittent productive cough.  O2 saturations otherwise maintained on room air. -Follow-up VQ scan -Albuterol nebs twice daily and as needed shortness of breath/wheeze -Solu-Medrol IV 125 mg IV x1 dose, then prednisone 40 mg p.o. daily -Mucinex  Leukocytosis: WBC elevated at 10.9.  Patient noted to be afebrile at emergency department. Chest x-ray with moderate right-sided pleural effusion with atelectasis and/or infiltrate.  During prior hospitalization patient was noted to have concern for aspiration pneumonia. -Check procalcitonin -Check urinalysis -Recheck CBC in a.m.  Elevated troponin: High-sensitivity troponin 31->31.  Patient denies any reports of chest pain and EKG without significant ischemic changes.  Suspect secondary to demand in the setting of respiratory distress   Essential hypertension: Blood pressures currently maintained. -Continue hydralazine, isosorbide mononitrate, metoprolol, and  nifedipine  Chronic kidney disease stage IV: Creatinine 2.63 which appears near patient's baseline. -Continue to monitor kidney function with IV diuresis  Normocytic anemia: Hemoglobin 10 g/dL which appears improved from previous on 3/17 and 8 g/dL.  Patient denies any reports of bleeding. -Continue to monitor  Dyslipidemia -Continue simvastatin  BPH -Continue Flomax  OSA: Patient has not been able to use CPAP machine since last month due to recent recall.  -CPAP per RT  GERD with history of duodenal ulcer: Patient had just recently been admitted into the hospital on noted to have a duodenal ulcer EGD. -Continue Protonix twice daily  DNR: Present on admission  DVT prophylaxis: Heparin Code Status: DNR Family Communication: Significant other and updated at bedside Disposition Plan: Hopefully discharge home once medically stable Consults called: None Admission status: Observation, requiring mittens overnight  Norval Morton MD [facility]   If 7PM-7AM, please contact night-coverage   05/13/2020, 3:10 PM

## 2020-05-14 DIAGNOSIS — Z8249 Family history of ischemic heart disease and other diseases of the circulatory system: Secondary | ICD-10-CM | POA: Diagnosis not present

## 2020-05-14 DIAGNOSIS — Z8711 Personal history of peptic ulcer disease: Secondary | ICD-10-CM | POA: Diagnosis not present

## 2020-05-14 DIAGNOSIS — I130 Hypertensive heart and chronic kidney disease with heart failure and stage 1 through stage 4 chronic kidney disease, or unspecified chronic kidney disease: Secondary | ICD-10-CM | POA: Diagnosis present

## 2020-05-14 DIAGNOSIS — I442 Atrioventricular block, complete: Secondary | ICD-10-CM | POA: Diagnosis present

## 2020-05-14 DIAGNOSIS — I509 Heart failure, unspecified: Secondary | ICD-10-CM | POA: Diagnosis not present

## 2020-05-14 DIAGNOSIS — N400 Enlarged prostate without lower urinary tract symptoms: Secondary | ICD-10-CM | POA: Diagnosis present

## 2020-05-14 DIAGNOSIS — Z8673 Personal history of transient ischemic attack (TIA), and cerebral infarction without residual deficits: Secondary | ICD-10-CM | POA: Diagnosis not present

## 2020-05-14 DIAGNOSIS — Z83438 Family history of other disorder of lipoprotein metabolism and other lipidemia: Secondary | ICD-10-CM | POA: Diagnosis not present

## 2020-05-14 DIAGNOSIS — G4733 Obstructive sleep apnea (adult) (pediatric): Secondary | ICD-10-CM | POA: Diagnosis present

## 2020-05-14 DIAGNOSIS — I428 Other cardiomyopathies: Secondary | ICD-10-CM | POA: Diagnosis present

## 2020-05-14 DIAGNOSIS — I10 Essential (primary) hypertension: Secondary | ICD-10-CM

## 2020-05-14 DIAGNOSIS — Z950 Presence of cardiac pacemaker: Secondary | ICD-10-CM | POA: Diagnosis not present

## 2020-05-14 DIAGNOSIS — N184 Chronic kidney disease, stage 4 (severe): Secondary | ICD-10-CM | POA: Diagnosis present

## 2020-05-14 DIAGNOSIS — Z886 Allergy status to analgesic agent status: Secondary | ICD-10-CM | POA: Diagnosis not present

## 2020-05-14 DIAGNOSIS — F0390 Unspecified dementia without behavioral disturbance: Secondary | ICD-10-CM | POA: Diagnosis present

## 2020-05-14 DIAGNOSIS — J9811 Atelectasis: Secondary | ICD-10-CM | POA: Diagnosis present

## 2020-05-14 DIAGNOSIS — Z9111 Patient's noncompliance with dietary regimen: Secondary | ICD-10-CM | POA: Diagnosis not present

## 2020-05-14 DIAGNOSIS — R0602 Shortness of breath: Secondary | ICD-10-CM | POA: Diagnosis present

## 2020-05-14 DIAGNOSIS — K219 Gastro-esophageal reflux disease without esophagitis: Secondary | ICD-10-CM | POA: Diagnosis present

## 2020-05-14 DIAGNOSIS — I5043 Acute on chronic combined systolic (congestive) and diastolic (congestive) heart failure: Secondary | ICD-10-CM | POA: Diagnosis present

## 2020-05-14 DIAGNOSIS — I495 Sick sinus syndrome: Secondary | ICD-10-CM | POA: Diagnosis present

## 2020-05-14 DIAGNOSIS — E785 Hyperlipidemia, unspecified: Secondary | ICD-10-CM

## 2020-05-14 DIAGNOSIS — Z7189 Other specified counseling: Secondary | ICD-10-CM | POA: Diagnosis not present

## 2020-05-14 DIAGNOSIS — Z20822 Contact with and (suspected) exposure to covid-19: Secondary | ICD-10-CM | POA: Diagnosis present

## 2020-05-14 DIAGNOSIS — Z66 Do not resuscitate: Secondary | ICD-10-CM | POA: Diagnosis present

## 2020-05-14 DIAGNOSIS — D649 Anemia, unspecified: Secondary | ICD-10-CM | POA: Diagnosis present

## 2020-05-14 DIAGNOSIS — J209 Acute bronchitis, unspecified: Secondary | ICD-10-CM | POA: Diagnosis present

## 2020-05-14 DIAGNOSIS — I4819 Other persistent atrial fibrillation: Secondary | ICD-10-CM | POA: Diagnosis present

## 2020-05-14 DIAGNOSIS — J40 Bronchitis, not specified as acute or chronic: Secondary | ICD-10-CM | POA: Diagnosis not present

## 2020-05-14 LAB — CBC
HCT: 32.8 % — ABNORMAL LOW (ref 39.0–52.0)
Hemoglobin: 10.5 g/dL — ABNORMAL LOW (ref 13.0–17.0)
MCH: 30.7 pg (ref 26.0–34.0)
MCHC: 32 g/dL (ref 30.0–36.0)
MCV: 95.9 fL (ref 80.0–100.0)
Platelets: 312 10*3/uL (ref 150–400)
RBC: 3.42 MIL/uL — ABNORMAL LOW (ref 4.22–5.81)
RDW: 16.7 % — ABNORMAL HIGH (ref 11.5–15.5)
WBC: 4.7 10*3/uL (ref 4.0–10.5)
nRBC: 0.4 % — ABNORMAL HIGH (ref 0.0–0.2)

## 2020-05-14 LAB — BASIC METABOLIC PANEL
Anion gap: 10 (ref 5–15)
BUN: 60 mg/dL — ABNORMAL HIGH (ref 8–23)
CO2: 20 mmol/L — ABNORMAL LOW (ref 22–32)
Calcium: 8.5 mg/dL — ABNORMAL LOW (ref 8.9–10.3)
Chloride: 107 mmol/L (ref 98–111)
Creatinine, Ser: 2.58 mg/dL — ABNORMAL HIGH (ref 0.61–1.24)
GFR, Estimated: 24 mL/min — ABNORMAL LOW (ref >60)
Glucose, Bld: 149 mg/dL — ABNORMAL HIGH (ref 70–99)
Potassium: 4.5 mmol/L (ref 3.5–5.1)
Sodium: 137 mmol/L (ref 135–145)

## 2020-05-14 MED ORDER — FUROSEMIDE 10 MG/ML IJ SOLN
40.0000 mg | Freq: Two times a day (BID) | INTRAMUSCULAR | Status: DC
  Administered 2020-05-14 – 2020-05-15 (×3): 40 mg via INTRAVENOUS
  Filled 2020-05-14 (×3): qty 4

## 2020-05-14 MED ORDER — ALBUTEROL SULFATE (2.5 MG/3ML) 0.083% IN NEBU
2.5000 mg | INHALATION_SOLUTION | Freq: Two times a day (BID) | RESPIRATORY_TRACT | Status: DC
  Filled 2020-05-14 (×2): qty 3

## 2020-05-14 MED ORDER — PANTOPRAZOLE SODIUM 40 MG PO TBEC
40.0000 mg | DELAYED_RELEASE_TABLET | Freq: Two times a day (BID) | ORAL | Status: DC
  Administered 2020-05-14 – 2020-05-16 (×4): 40 mg via ORAL
  Filled 2020-05-14 (×4): qty 1

## 2020-05-14 NOTE — Consult Note (Addendum)
Reason for Consult: Acute on chronic kidney injury Referring Physician: Damita Lack, MD  Chief Complaint: Shortness of breath, leg swelling, weight gain  Assessment/Plan:  26 y/ M with history of CKD stage 4,stroke (2015) with mild residual weakness of left facial and left upper extremity weakness, HTN, hyperlipidemia, sinus node dysfunction, Hx of recurrent upper GI bleed 2/2 duodenal ulcer, combined systolic and diastolic heart failure that recently diagnosed a month ago (EF35-40%), who presented with weight gain, SOB, leg swelling and admitted for CHF exacerbation.   Patient now with stage IV chronic kidney disease.   1. CKD stage 4: (GFR 24): Cr yesterday and today was 2.6. BUN 60>57. His current Cr is close to his recent baseline. K stable at 4. HCO3 is 20. GFR 24.  SOB  improved after Lasix on  RA w/  1-2+ LEE up to mid toibia, rt arm swelling. UA: Unremarkable except Pr 30. Last renal US was on march 2022 showed  medical renal disease but no e/o obstruction. He had AKI on CKD (Cr went up to 3.7 that time from his previous baseline of 2) when hospitalized recently for GIB but Cr  then stabilized at 2.5 which may be his new baseline.  - No need to repeat the [name] but will check bladder scan.  -Continue IV lasix 40 mg BID and will reassess kidney function again tomorrow. -K and Hco3 stable -Renal function daily, Strict I and Os, daily weights -Fluid restriction to 1.2 L /24hr  2. Acute on Chronic CHF: Diuresing w Lasix. Received Lasix 20 mg BID yesterday that increased to 40 mg IV BID by cardiology today. I/Os:-750   per cardiology with lasix 40 mg IV BID. Per cardiology, patient is not a candidate for further invasive cardiac testing.  3. HTN: Controlled on home medications: Hydralazine, Metoprolol, Nifedipine 4. Rt side pleural effusion: She has stabe rt side pleural effusion on CXR (w prior Hx of thoracentesis with removal of 1.2 L of fluid last hospitalization). The fluid was  transudate when analyzed on march. 5. BPH: On flomax    HPI: Brian Wolf is an 87 y.o. male with history of CKD 4 ,stroke (2015) with mild residual weakness of left facial and left upper extremity weakness, HTN, hyperlipidemia, sinus node dysfunction, Hx of recurrent upper GI bleed and combined systolic and diastolic heart failure that recently diagnosed a month ago (EF35-40%), who presented with weight gain, SOB, gradual worsening of legs and rt hand swelling. Per his wife, his weight gradually went up. He used to be on Lasix 20 mg few times a week that was increased to 20 mg BID now. He still gained about 2 lb in a day, and with sever SOB, and per his cardiologist recommendation, his wife called EMS and who was brought to the ED. He reports compliance to all of his home meds including Lasix 20 mg BID. Denies any problem with initiating urine. No dysuria.   Review of Systems  Respiratory: Positive for cough and shortness of breath.   Gastrointestinal: Negative for nausea and vomiting.  Genitourinary: Negative for dysuria.   Pertinent items are noted in HPI.  Chemistry and CBC: Creatinine, Ser  Date/Time Value Ref Range Status  05/14/2020 05:50 AM 2.58 (H) 0.61 - 1.24 mg/dL Final  05/13/2020 10:56 AM 2.63 (H) 0.61 - 1.24 mg/dL Final  04/18/2020 04:07 AM 2.54 (H) 0.61 - 1.24 mg/dL Final  04/17/2020 02:37 AM 2.62 (H) 0.61 - 1.24 mg/dL Final  04/16/2020 06:10 AM 2.64 (  H) 0.61 - 1.24 mg/dL Final  04/15/2020 03:41 AM 2.63 (H) 0.61 - 1.24 mg/dL Final  04/14/2020 03:53 AM 2.78 (H) 0.61 - 1.24 mg/dL Final  04/13/2020 07:38 AM 2.83 (H) 0.61 - 1.24 mg/dL Final  04/12/2020 03:30 AM 3.15 (H) 0.61 - 1.24 mg/dL Final  04/11/2020 02:33 AM 3.42 (H) 0.61 - 1.24 mg/dL Final  04/10/2020 03:48 AM 3.75 (H) 0.61 - 1.24 mg/dL Final  04/09/2020 02:51 AM 3.67 (H) 0.61 - 1.24 mg/dL Final  04/08/2020 01:44 AM 3.21 (H) 0.61 - 1.24 mg/dL Final  04/07/2020 02:52 AM 2.96 (H) 0.61 - 1.24 mg/dL Final  04/06/2020  06:42 AM 2.77 (H) 0.61 - 1.24 mg/dL Final  04/05/2020 07:29 AM 2.63 (H) 0.61 - 1.24 mg/dL Final  10/22/2019 01:22 AM 1.79 (H) 0.61 - 1.24 mg/dL Final  10/21/2019 05:47 AM 2.06 (H) 0.61 - 1.24 mg/dL Final  10/20/2019 05:33 AM 1.88 (H) 0.61 - 1.24 mg/dL Final  10/19/2019 02:07 AM 2.02 (H) 0.61 - 1.24 mg/dL Final  10/18/2019 10:33 AM 2.20 (H) 0.61 - 1.24 mg/dL Final  10/18/2019 04:00 AM 2.20 (H) 0.61 - 1.24 mg/dL Final  10/17/2019 03:50 PM 2.09 (H) 0.61 - 1.24 mg/dL Final  10/17/2019 09:08 AM 2.20 (H) 0.61 - 1.24 mg/dL Final  10/17/2019 09:03 AM 2.12 (H) 0.61 - 1.24 mg/dL Final  06/26/2019 06:34 PM 1.87 (H) 0.61 - 1.24 mg/dL Final  03/10/2016 04:24 AM 1.32 (H) 0.61 - 1.24 mg/dL Final  03/09/2016 05:12 AM 1.42 (H) 0.61 - 1.24 mg/dL Final  03/08/2016 11:03 PM 1.45 (H) 0.61 - 1.24 mg/dL Final  12/21/2013 10:26 AM 0.96 0.50 - 1.35 mg/dL Final  12/20/2013 06:37 AM 0.88 0.50 - 1.35 mg/dL Final  12/19/2013 07:44 AM 0.82 0.50 - 1.35 mg/dL Final  12/17/2013 05:45 AM 0.86 0.50 - 1.35 mg/dL Final  12/16/2013 04:00 AM 0.96 0.50 - 1.35 mg/dL Final  12/15/2013 03:42 AM 1.02 0.50 - 1.35 mg/dL Final  12/14/2013 03:50 PM 0.88 0.50 - 1.35 mg/dL Final  12/14/2013 11:37 AM 0.92 0.50 - 1.35 mg/dL Final   Recent Labs  Lab 05/13/20 1056 05/14/20 0550  NA 137 137  K 4.3 4.5  CL 108 107  CO2 20* 20*  GLUCOSE 99 149*  BUN 57* 60*  CREATININE 2.63* 2.58*  CALCIUM 8.2* 8.5*   Recent Labs  Lab 05/13/20 1056 05/14/20 0550  WBC 10.9* 4.7  NEUTROABS 5.1  --   HGB 10.0* 10.5*  HCT 30.9* 32.8*  MCV 95.1 95.9  PLT 286 312   Liver Function Tests: Recent Labs  Lab 05/13/20 1056  AST 37  ALT 22  ALKPHOS 122  BILITOT 0.6  PROT 6.1*  ALBUMIN 2.5*   No results for input(s): LIPASE, AMYLASE in the last 168 hours. No results for input(s): AMMONIA in the last 168 hours. Cardiac Enzymes: No results for input(s): CKTOTAL, CKMB, CKMBINDEX, TROPONINI in the last 168 hours. Iron Studies: No results for  input(s): IRON, TIBC, TRANSFERRIN, FERRITIN in the last 72 hours. PT/INR: @LABRCNTIP (inr:5)  Xrays/Other Studies: ) Results for orders placed or performed during the hospital encounter of 05/13/20 (from the past 48 hour(s))  Brain natriuretic peptide     Status: Abnormal   Collection Time: 05/13/20 10:56 AM  Result Value Ref Range   B Natriuretic Peptide 677.4 (H) 0.0 - 100.0 pg/mL    Comment: Performed at [facility], [address], [place], [place] [address]  Troponin I (High Sensitivity)     Status: Abnormal   Collection Time:  05/13/20 10:56 AM  Result Value Ref Range   Troponin I (High Sensitivity) 31 (H) <18 ng/L    Comment: (NOTE) Elevated high sensitivity troponin I (hsTnI) values and significant  changes across serial measurements may suggest ACS but many other  chronic and acute conditions are known to elevate hsTnI results.  Refer to the "Links" section for chest pain algorithms and additional  guidance. Performed at [facility], [place] [address], [place], [place] [address]   CBC with Differential     Status: Abnormal   Collection Time: 05/13/20 10:56 AM  Result Value Ref Range   WBC 10.9 (H) 4.0 - 10.5 K/uL   RBC 3.25 (L) 4.22 - 5.81 MIL/uL   Hemoglobin 10.0 (L) 13.0 - 17.0 g/dL   HCT 30.9 (L) 39.0 - 52.0 %   MCV 95.1 80.0 - 100.0 fL   MCH 30.8 26.0 - 34.0 pg   MCHC 32.4 30.0 - 36.0 g/dL   RDW 16.8 (H) 11.5 - 15.5 %   Platelets 286 150 - 400 K/uL   nRBC 0.0 0.0 - 0.2 %   Neutrophils Relative % 46 %   Neutro Abs 5.1 1.7 - 7.7 K/uL   Lymphocytes Relative 36 %   Lymphs Abs 3.9 0.7 - 4.0 K/uL   Monocytes Relative 9 %   Monocytes Absolute 1.0 0.1 - 1.0 K/uL   Eosinophils Relative 7 %   Eosinophils Absolute 0.8 (H) 0.0 - 0.5 K/uL   Basophils Relative 1 %   Basophils Absolute 0.1 0.0 - 0.1 K/uL   Immature Granulocytes 1 %   Abs Immature Granulocytes 0.06 0.00 - 0.07 K/uL    Comment: Performed at [facility], [place] [address] [place],  [place] [address]  Comprehensive metabolic panel     Status: Abnormal   Collection Time: 05/13/20 10:56 AM  Result Value Ref Range   Sodium 137 135 - 145 mmol/L   Potassium 4.3 3.5 - 5.1 mmol/L   Chloride 108 98 - 111 mmol/L   CO2 20 (L) 22 - 32 mmol/L   Glucose, Bld 99 70 - 99 mg/dL    Comment: Glucose reference range applies only to samples taken after fasting for at least 8 hours.   BUN 57 (H) 8 - 23 mg/dL   Creatinine, Ser 2.63 (H) 0.61 - 1.24 mg/dL   Calcium 8.2 (L) 8.9 - 10.3 mg/dL   Total Protein 6.1 (L) 6.5 - 8.1 g/dL   Albumin 2.5 (L) 3.5 - 5.0 g/dL   AST 37 15 - 41 U/L   ALT 22 0 - 44 U/L   Alkaline Phosphatase 122 38 - 126 U/L   Total Bilirubin 0.6 0.3 - 1.2 mg/dL   GFR, Estimated 23 (L) >60 mL/min    Comment: (NOTE) Calculated using the CKD-EPI Creatinine Equation (2021)    Anion gap 9 5 - 15    Comment: Performed at [facility], [place] [address] [place], [place] [address]  Resp Panel by RT-PCR (Flu A&B, Covid) Nasopharyngeal Swab     Status: None   Collection Time: 05/13/20 10:56 AM   Specimen: Nasopharyngeal Swab; Nasopharyngeal(NP) swabs in vial transport medium  Result Value Ref Range   SARS Coronavirus 2 by RT PCR NEGATIVE NEGATIVE    Comment: (NOTE) SARS-CoV-2 target nucleic acids are NOT DETECTED.  The SARS-CoV-2 RNA is generally detectable in upper respiratory specimens during the acute phase of infection. The lowest concentration of SARS-CoV-2 viral copies this assay can detect is  138 copies/mL. A negative result does not preclude SARS-Cov-2 infection and should not be used as the sole basis for treatment or other patient management decisions. A negative result may occur with  improper specimen collection/handling, submission of specimen other than nasopharyngeal swab, presence of viral mutation(s) within the areas targeted by this assay, and inadequate number of viral copies(<138 copies/mL). A negative result must be combined with clinical observations,  patient history, and epidemiological information. The expected result is Negative.  Fact Sheet for Patients:  EntrepreneurPulse.com.au  Fact Sheet for Healthcare Providers:  IncredibleEmployment.be  This test is no t yet approved or cleared by the [place] FDA and  has been authorized for detection and/or diagnosis of SARS-CoV-2 by FDA under an Emergency Use Authorization (EUA). This EUA will remain  in effect (meaning this test can be used) for the duration of the COVID-19 declaration under Section 564(b)(1) of the Act, 21 U.S.C.section 360bbb-3(b)(1), unless the authorization is terminated  or revoked sooner.       Influenza A by PCR NEGATIVE NEGATIVE   Influenza B by PCR NEGATIVE NEGATIVE    Comment: (NOTE) The Xpert Xpress SARS-CoV-2/FLU/RSV plus assay is intended as an aid in the diagnosis of influenza from Nasopharyngeal swab specimens and should not be used as a sole basis for treatment. Nasal washings and aspirates are unacceptable for Xpert Xpress SARS-CoV-2/FLU/RSV testing.  Fact Sheet for Patients: EntrepreneurPulse.com.au  Fact Sheet for Healthcare Providers: IncredibleEmployment.be  This test is not yet approved or cleared by the [place] FDA and has been authorized for detection and/or diagnosis of SARS-CoV-2 by FDA under an Emergency Use Authorization (EUA). This EUA will remain in effect (meaning this test can be used) for the duration of the COVID-19 declaration under Section 564(b)(1) of the Act, 21 U.S.C. section 360bbb-3(b)(1), unless the authorization is terminated or revoked.  Performed at [facility], [place] [address] [place], [place] [address]   Troponin I (High Sensitivity)     Status: Abnormal   Collection Time: 05/13/20 12:43 PM  Result Value Ref Range   Troponin I (High Sensitivity) 31 (H) <18 ng/L    Comment: (NOTE) Elevated high sensitivity troponin I  (hsTnI) values and significant  changes across serial measurements may suggest ACS but many other  chronic and acute conditions are known to elevate hsTnI results.  Refer to the "Links" section for chest pain algorithms and additional  guidance. Performed at [facility], [place] [address], [place], [place] [address]   Urinalysis, Routine w reflex microscopic Urine, Clean Catch     Status: Abnormal   Collection Time: 05/13/20  4:58 PM  Result Value Ref Range   Color, Urine STRAW (A) YELLOW   APPearance CLEAR CLEAR   Specific Gravity, Urine 1.006 1.005 - 1.030   pH 5.0 5.0 - 8.0   Glucose, UA NEGATIVE NEGATIVE mg/dL   Hgb urine dipstick NEGATIVE NEGATIVE   Bilirubin Urine NEGATIVE NEGATIVE   Ketones, ur NEGATIVE NEGATIVE mg/dL   Protein, ur 30 (A) NEGATIVE mg/dL   Nitrite NEGATIVE NEGATIVE   Leukocytes,Ua NEGATIVE NEGATIVE   RBC / HPF 0-5 0 - 5 RBC/hpf   WBC, UA 0-5 0 - 5 WBC/hpf   Bacteria, UA NONE SEEN NONE SEEN   Squamous Epithelial / LPF 0-5 0 - 5   Mucus PRESENT     Comment: Performed at [facility], [place] [address], [place], [place] [address]  Procalcitonin - Baseline     Status: None  Collection Time: 05/13/20  6:22 PM  Result Value Ref Range   Procalcitonin 0.25 ng/mL    Comment:        Interpretation: PCT (Procalcitonin) <= 0.5 ng/mL: Systemic infection (sepsis) is not likely. Local bacterial infection is possible. (NOTE)       Sepsis PCT Algorithm           Lower Respiratory Tract                                      Infection PCT Algorithm    ----------------------------     ----------------------------         PCT < 0.25 ng/mL                PCT < 0.10 ng/mL          Strongly encourage             Strongly discourage   discontinuation of antibiotics    initiation of antibiotics    ----------------------------     -----------------------------       PCT 0.25 - 0.50 ng/mL            PCT 0.10 - 0.25 ng/mL               OR       >80% decrease in PCT             Discourage initiation of                                            antibiotics      Encourage discontinuation           of antibiotics    ----------------------------     -----------------------------         PCT >= 0.50 ng/mL              PCT 0.26 - 0.50 ng/mL               AND        <80% decrease in PCT             Encourage initiation of                                             antibiotics       Encourage continuation           of antibiotics    ----------------------------     -----------------------------        PCT >= 0.50 ng/mL                  PCT > 0.50 ng/mL               AND         increase in PCT                  Strongly encourage                                      initiation of antibiotics    Strongly encourage escalation  of antibiotics                                     -----------------------------                                           PCT <= 0.25 ng/mL                                                 OR                                        > 80% decrease in PCT                                      Discontinue / Do not initiate                                             antibiotics  Performed at [facility], [place] [address] [place], [place] [address]   Basic metabolic panel     Status: Abnormal   Collection Time: 05/14/20  5:50 AM  Result Value Ref Range   Sodium 137 135 - 145 mmol/L   Potassium 4.5 3.5 - 5.1 mmol/L   Chloride 107 98 - 111 mmol/L   CO2 20 (L) 22 - 32 mmol/L   Glucose, Bld 149 (H) 70 - 99 mg/dL    Comment: Glucose reference range applies only to samples taken after fasting for at least 8 hours.   BUN 60 (H) 8 - 23 mg/dL   Creatinine, Ser 2.58 (H) 0.61 - 1.24 mg/dL   Calcium 8.5 (L) 8.9 - 10.3 mg/dL   GFR, Estimated 24 (L) >60 mL/min    Comment: (NOTE) Calculated using the CKD-EPI Creatinine Equation (2021)    Anion gap 10 5 - 15    Comment: Performed at [facility] [address]  [place], [place] [address]  CBC     Status: Abnormal   Collection Time: 05/14/20  5:50 AM  Result Value Ref Range   WBC 4.7 4.0 - 10.5 K/uL   RBC 3.42 (L) 4.22 - 5.81 MIL/uL   Hemoglobin 10.5 (L) 13.0 - 17.0 g/dL   HCT 32.8 (L) 39.0 - 52.0 %   MCV 95.9 80.0 - 100.0 fL   MCH 30.7 26.0 - 34.0 pg   MCHC 32.0 30.0 - 36.0 g/dL   RDW 16.7 (H) 11.5 - 15.5 %   Platelets 312 150 - 400 K/uL   nRBC 0.4 (H) 0.0 - 0.2 %    Comment: Performed at [facility] [address] [place], [place] [address]   DG Chest 2 View  Result Date: 05/13/2020 CLINICAL DATA:  Shortness of breath. EXAM: CHEST - 2 VIEW COMPARISON:  Jul 01, 2020. FINDINGS: Stable cardiomegaly. No pneumothorax is noted. Stable mild-to-moderate right-sided pleural  effusion is noted with associated right basilar atelectasis or infiltrate. Minimal left basilar subsegmental atelectasis is noted with small left pleural effusion. Bony thorax is unremarkable. IMPRESSION: Stable moderate size right pleural effusion is noted with associated atelectasis or infiltrate. Stable left basilar opacities as well. Electronically Signed   By: Marijo Conception M.D.   On: 05/13/2020 11:55   NM PULMONARY VENT AND PERF (V/Q Scan)  Result Date: 05/13/2020 CLINICAL DATA:  PE suspected, high prob Lower extremity swelling, dyspnea and wheezing. EXAM: NUCLEAR MEDICINE PERFUSION LUNG SCAN TECHNIQUE: Perfusion images were obtained in multiple projections after intravenous injection of radiopharmaceutical. Ventilation scans intentionally deferred if perfusion scan and chest x-ray adequate for interpretation during COVID 19 epidemic. RADIOPHARMACEUTICALS:  4.25 mCi Tc-41m MAA IV COMPARISON:  Radiograph earlier today. FINDINGS: No peripheral wedge-shaped perfusion defects to suggest pulmonary embolus. Slight volume loss in the right hemithorax is due to right pleural effusion. Linear area of photopenia in the right lung likely corresponds to fluid in the fissure when correlated  with radiograph. IMPRESSION: No scintigraphic findings of pulmonary embolus. Electronically Signed   By: Keith Rake M.D.   On: 05/13/2020 17:15   VAS [name] LOWER EXTREMITY VENOUS (DVT) (ONLY [facility] & [facility])  Result Date: 05/13/2020  Lower Venous DVT Study Indications: Edema.  Comparison Study: no prior Performing Technologist: Abram Sander RVS  Examination Guidelines: A complete evaluation includes B-mode imaging, spectral Doppler, color Doppler, and power Doppler as needed of all accessible portions of each vessel. Bilateral testing is considered an integral part of a complete examination. Limited examinations for reoccurring indications may be performed as noted. The reflux portion of the exam is performed with the patient in reverse Trendelenburg.  +---------+---------------+---------+-----------+----------+--------------+ RIGHT    CompressibilityPhasicitySpontaneityPropertiesThrombus Aging +---------+---------------+---------+-----------+----------+--------------+ CFV      Full           Yes      Yes                                 +---------+---------------+---------+-----------+----------+--------------+ SFJ      Full                                                        +---------+---------------+---------+-----------+----------+--------------+ FV Prox  Full                                                        +---------+---------------+---------+-----------+----------+--------------+ FV Mid   Full                                                        +---------+---------------+---------+-----------+----------+--------------+ FV DistalFull                                                        +---------+---------------+---------+-----------+----------+--------------+  PFV      Full                                                        +---------+---------------+---------+-----------+----------+--------------+ POP      Full           Yes      Yes                                  +---------+---------------+---------+-----------+----------+--------------+ PTV      Full                                                        +---------+---------------+---------+-----------+----------+--------------+ PERO     Full                                                        +---------+---------------+---------+-----------+----------+--------------+   +----+---------------+---------+-----------+----------+-------------------+ LEFTCompressibilityPhasicitySpontaneityPropertiesThrombus Aging      +----+---------------+---------+-----------+----------+-------------------+ CFV                                              Not well visualized +----+---------------+---------+-----------+----------+-------------------+     Summary: RIGHT: - There is no evidence of deep vein thrombosis in the lower extremity.  - No cystic structure found in the popliteal fossa.   *See table(s) above for measurements and observations. Electronically signed by Deitra Mayo MD on 05/13/2020 at 1:50:03 PM.    Final     PMH:   Past Medical History:  Diagnosis Date  . CVA (cerebral infarction) 12/2013  . Dementia ([place])   . Gastric ulcer    prior to 2000  . Hypertension   . Sleep difficulties     PSH:   Past Surgical History:  Procedure Laterality Date  . ABDOMINAL SURGERY    . BIOPSY  04/06/2020   Procedure: BIOPSY;  Surgeon: Ronald Lobo, MD;  Location: [facility];  Service: Endoscopy;;  . CATARACT EXTRACTION Left   . ESOPHAGOGASTRODUODENOSCOPY N/A 10/18/2019   Procedure: ESOPHAGOGASTRODUODENOSCOPY (EGD);  Surgeon: Wonda Horner, MD;  Location[facility] ENDOSCOPY;  Service: Endoscopy;  Laterality: N/A;  . ESOPHAGOGASTRODUODENOSCOPY (EGD) WITH PROPOFOL N/A 04/06/2020   Procedure: ESOPHAGOGASTRODUODENOSCOPY (EGD) WITH PROPOFOL;  Surgeon: Ronald Lobo, MD;  Location: [facility];  Service: Endoscopy;  Laterality: N/A;  . IR THORACENTESIS ASP PLEURAL  SPACE W/IMG GUIDE  04/08/2020    Allergies:  Allergies  Allergen Reactions  . Lisinopril Cough  . Nsaids Other (See Comments)    GI bleed(s)     Medications:   Prior to Admission medications   Medication Sig Start Date End Date Taking? Authorizing Provider  fexofenadine (ALLEGRA) 180 MG tablet Take 180 mg by mouth in the morning.   Yes [provider]  furosemide (LASIX) 20 MG tablet Take 1 tablet (20 mg total) by mouth every other day.  Patient taking differently: Take 20 mg by mouth in the morning and at bedtime. 04/18/20  Yes Kathie Dike, MD  hydrALAZINE (APRESOLINE) 50 MG tablet Take 1 tablet (50 mg total) by mouth 3 (three) times daily. 11/21/19  Yes Cantwell, Celeste C, PA-C  isosorbide dinitrate (ISORDIL) 30 MG tablet Take 1 tablet (30 mg total) by mouth 3 (three) times daily. 09/03/19  Yes Adrian Prows, MD  lactulose (CHRONULAC) 10 GM/15ML solution Take 15 mLs (10 g total) by mouth daily as needed for mild constipation. 04/18/20  Yes Kathie Dike, MD  metoprolol succinate (TOPROL-XL) 25 MG 24 hr tablet Take 1 tablet (25 mg total) by mouth daily. 04/19/20  Yes Kathie Dike, MD  NIFEdipine (ADALAT CC) 60 MG 24 hr tablet Take 60 mg by mouth at bedtime.   Yes [provider]  pantoprazole (PROTONIX) 40 MG tablet Take 1 tablet (40 mg total) by mouth 2 (two) times daily before a meal. 10/22/19  Yes Shawna Clamp, MD  Probiotic Product (PROBIOTIC DAILY PO) Take 1 capsule by mouth in the morning.   Yes [provider]  simvastatin (ZOCOR) 40 MG tablet Take 40 mg by mouth at bedtime. 11/30/13  Yes [provider]  tamsulosin (FLOMAX) 0.4 MG CAPS capsule Take 0.4 mg by mouth daily after supper. 06/03/16  Yes [provider]  Travoprost, BAK Free, (TRAVATAN) 0.004 % SOLN ophthalmic solution Place 1 drop into both eyes every evening.   Yes [provider]  potassium chloride SA (KLOR-CON) 20 MEQ tablet Take 1 tablet (20 mEq total) by mouth  daily as needed (With furosemide). 05/05/20   Cantwell, Celeste C, PA-C    Discontinued Meds:   Medications Discontinued During This Encounter  Medication Reason  . hydrochlorothiazide (HYDRODIURIL) 25 MG tablet Patient Preference  . furosemide (LASIX) injection 20 mg     Social History:  reports that he has never smoked. He has never used smokeless tobacco. He reports that he does not drink alcohol and does not use drugs.  Family History:   Family History  Problem Relation Age of Onset  . Hyperlipidemia Mother   . Hypertension Mother   . Hypertension Father     Blood pressure 140/63, pulse 73, temperature 98.1 F (36.7 C), temperature source Oral, resp. rate 18, height 5\' 4"  (1.626 m), weight 74.6 kg, SpO2 95 %. Physical Exam Constitutional:      General: He is not in acute distress.    Appearance: He is not ill-appearing.     Comments: Hard of hearing.  Neck:     Vascular: No JVD.  Cardiovascular:     Rate and Rhythm: Normal rate and regular rhythm.     Heart sounds: Murmur heard.    Pulmonary:     Breath sounds: Decreased breath sounds present.  Musculoskeletal:     Right lower leg: Edema present.     Left lower leg: Edema present.  Neurological:     Mental Status: He is alert.     Comments: Alert. Oriented at baseline Mild weakness of rt upper and lower extremity (chronic)  Psychiatric:        Mood and Affect: Mood normal.        Behavior: Behavior normal.          Elhamalsadat Masoudi, MD 05/14/2020, 1:44 PM  I have seen and examined this patient and agree with the plan of care. Noted amended   Dwana Melena, MD 05/15/2020, 11:20 AM

## 2020-05-14 NOTE — Consult Note (Signed)
CARDIOLOGY CONSULT NOTE  Patient ID: Brian Wolf MRN: [id number] DOB/AGE: December 09, 1931 87 y.o.  Admit date: 05/13/2020 Referring Physician  Damita Lack, MD Primary Physician:  [name], DO Reason for Consultation  Heart failure   Patient ID: Brian Wolf, male    DOB: 05/01/1931, 87 y.o.   MRN: [id number]  Chief Complaint  Patient presents with  . Shortness of Breath   HPI:    [name]  is a 87 y.o. male with history of stroke in 2015 with mild residual left facial and left upper extremity weakness, difficult to control hypertension, hyperlipidemia, sinus node dysfunction.  Patient also has history of recurrent upper GI bleed in September 2021 and again in March 2022.  During hospitalization last month patient with new diagnosis of combined systolic and diastolic heart failure with LVEF 35-40% as well as acute kidney injury.  Patient now with stage IV chronic kidney disease.  Patient's wife had called our office 2 days ago with concerns that patient had experienced weight gain and his feet were swollen, at that time patient was advised to take Lasix 20 mg p.o. twice daily.  Patient's wife called office again yesterday reporting that patient symptoms were worsening including dyspnea, orthopnea, and wheezing and he had gained an additional 2 pounds.  Patient was advised to go to the emergency department for diuresis in view of worsening symptoms as well as worsening renal function since last hospitalization.  Evaluation in the emergency department revealed elevated BNP at 677 and mildly elevated, but flat, troponin 31-31.  DVT study was negative and V/Q study negative for PE, Chest x-ray revealed stable right pleural effusion. Patient was admitted for further diuresis and started on Lasix IV 20 mg twice daily.  Since admission patient is net -750 cc.   Patient is seen and examined this morning, family not present at bedside.  Patient reports his shortness of breath and  fatigue have improved some compared to yesterday, however he states he "does not remember" why he came to the hospital.  Past Medical History:  Diagnosis Date  . CVA (cerebral infarction) 12/2013  . Dementia ([place])   . Gastric ulcer    prior to 2000  . Hypertension   . Sleep difficulties    Past Surgical History:  Procedure Laterality Date  . ABDOMINAL SURGERY    . BIOPSY  04/06/2020   Procedure: BIOPSY;  Surgeon: Ronald Lobo, MD;  Location: [facility];  Service: Endoscopy;;  . CATARACT EXTRACTION Left   . ESOPHAGOGASTRODUODENOSCOPY N/A 10/18/2019   Procedure: ESOPHAGOGASTRODUODENOSCOPY (EGD);  Surgeon: Wonda Horner, MD;  Location: [facility] ENDOSCOPY;  Service: Endoscopy;  Laterality: N/A;  . ESOPHAGOGASTRODUODENOSCOPY (EGD) WITH PROPOFOL N/A 04/06/2020   Procedure: ESOPHAGOGASTRODUODENOSCOPY (EGD) WITH PROPOFOL;  Surgeon: Ronald Lobo, MD;  Location: [facility];  Service: Endoscopy;  Laterality: N/A;  . IR THORACENTESIS ASP PLEURAL SPACE W/IMG GUIDE  04/08/2020   Family History  Problem Relation Age of Onset  . Hyperlipidemia Mother   . Hypertension Mother   . Hypertension Father    Social History   Tobacco Use  . Smoking status: Never Smoker  . Smokeless tobacco: Never Used  Substance Use Topics  . Alcohol use: No    Alcohol/week: 0.0 standard drinks    Marital Sttus: Married  ROS  Review of Systems  Constitutional: Positive for malaise/fatigue. Negative for weight gain.  Cardiovascular: Positive for leg swelling and orthopnea. Negative for chest pain, claudication, near-syncope, palpitations, paroxysmal nocturnal dyspnea and syncope.  Respiratory: Positive for cough, shortness of breath and wheezing.   Hematologic/Lymphatic: Does not bruise/bleed easily.  Gastrointestinal: Negative for melena.  Neurological: Positive for weakness. Negative for dizziness.  All other systems reviewed and are negative.  Objective   Vitals with BMI 05/14/2020 05/14/2020 05/13/2020   Height - - -  Weight 164 lbs 7 oz - -  BMI 48.18 - -  Systolic 563 149 702  Diastolic 46 63 66  Pulse 72 72 69    Blood pressure (!) 123/46, pulse 72, temperature 98.1 F (36.7 C), temperature source Oral, resp. rate 18, height 5\' 4"  (1.626 m), weight 74.6 kg, SpO2 94 %.    Physical Exam Vitals and nursing note reviewed.  Constitutional:      General: He is not in acute distress.    Appearance: He is obese.  HENT:     Head: Normocephalic and atraumatic.     Right Ear: External ear normal.     Left Ear: External ear normal.     Nose: Nose normal.     Mouth/Throat:     Mouth: Mucous membranes are moist.  Eyes:     Extraocular Movements: Extraocular movements intact.     Conjunctiva/sclera: Conjunctivae normal.  Neck:     Vascular: No JVD.  Cardiovascular:     Rate and Rhythm: Normal rate and regular rhythm.     Pulses: Intact distal pulses.     Heart sounds: S1 normal and S2 normal. No murmur heard. No gallop.   Pulmonary:     Effort: Pulmonary effort is normal. No respiratory distress.     Breath sounds: Wheezing (expiratory) and rales (right base) present. No rhonchi.  Abdominal:     General: Bowel sounds are normal. There is no distension.     Palpations: Abdomen is soft.  Musculoskeletal:     Cervical back: Normal range of motion and neck supple.     Right lower leg: Edema (1-2+ pitting) present.     Left lower leg: Edema (1-2+ pitting) present.  Skin:    General: Skin is warm and dry.     Findings: No lesion or rash.  Neurological:     General: No focal deficit present.     Mental Status: He is alert.     Comments: Oriented to self and place, not to time or [name] president.   Psychiatric:        Mood and Affect: Mood normal.        Behavior: Behavior normal.    Laboratory examination:    Recent Labs    10/20/19 0533 10/21/19 0547 10/21/19 1658 10/22/19 0122 04/05/20 0729 04/17/20 0237 04/18/20 0407 05/13/20 1056  NA 137 136  --  135   < > 137 136  137  K 5.0 5.1   < > 4.5   < > 4.0 4.1 4.3  CL 112* 107  --  107   < > 107 106 108  CO2 18* 20*  --  21*   < > 23 21* 20*  GLUCOSE 111* 95  --  113*   < > 100* 104* 99  BUN 53* 49*  --  44*   < > 51* 50* 57*  CREATININE 1.88* 2.06*  --  1.79*   < > 2.62* 2.54* 2.63*  CALCIUM 8.5* 8.2*  --  8.0*   < > 7.7* 7.7* 8.2*  GFRNONAA 32* 28*  --  34*   < > 23* 24* 23*  GFRAA 37* 33*  --  39*  --   --   --   --    < > = values in this interval not displayed.   estimated creatinine clearance is 18.7 mL/min (A) (by C-G formula based on SCr of 2.63 mg/dL (H)).  CMP Latest Ref Rng & Units 05/13/2020 04/18/2020 04/17/2020  Glucose 70 - 99 mg/dL 99 104(H) 100(H)  BUN 8 - 23 mg/dL 57(H) 50(H) 51(H)  Creatinine 0.61 - 1.24 mg/dL 2.63(H) 2.54(H) 2.62(H)  Sodium 135 - 145 mmol/L 137 136 137  Potassium 3.5 - 5.1 mmol/L 4.3 4.1 4.0  Chloride 98 - 111 mmol/L 108 106 107  CO2 22 - 32 mmol/L 20(L) 21(L) 23  Calcium 8.9 - 10.3 mg/dL 8.2(L) 7.7(L) 7.7(L)  Total Protein 6.5 - 8.1 g/dL 6.1(L) - -  Total Bilirubin 0.3 - 1.2 mg/dL 0.6 - -  Alkaline Phos 38 - 126 U/L 122 - -  AST 15 - 41 U/L 37 - -  ALT 0 - 44 U/L 22 - -   CBC Latest Ref Rng & Units 05/13/2020 04/17/2020 04/16/2020  WBC 4.0 - 10.5 K/uL 10.9(H) 14.7(H) 15.2(H)  Hemoglobin 13.0 - 17.0 g/dL 10.0(L) 8.0(L) 8.0(L)  Hematocrit 39.0 - 52.0 % 30.9(L) 25.3(L) 24.9(L)  Platelets 150 - 400 K/uL 286 323 322   Lipid Panel No results for input(s): CHOL, TRIG, LDLCALC, VLDL, HDL, CHOLHDL, LDLDIRECT in the last 8760 hours.  HEMOGLOBIN A1C Lab Results  Component Value Date   HGBA1C 6.4 (H) 12/15/2013   MPG 137 (H) 12/15/2013   TSH Recent Labs    10/20/19 0706 04/05/20 1847  TSH 2.706 4.188   BNP (last 3 results) Recent Labs    04/09/20 0251 04/10/20 0348 05/13/20 1056  BNP 1,085.0* 775.5* 677.4*   Results for orders placed or performed during the hospital encounter of 05/13/20 (from the past 48 hour(s))  Brain natriuretic peptide     Status:  Abnormal   Collection Time: 05/13/20 10:56 AM  Result Value Ref Range   B Natriuretic Peptide 677.4 (H) 0.0 - 100.0 pg/mL    Comment: Performed at [facility] [address] [place], [place] [address]  Troponin I (High Sensitivity)     Status: Abnormal   Collection Time: 05/13/20 10:56 AM  Result Value Ref Range   Troponin I (High Sensitivity) 31 (H) <18 ng/L    Comment: (NOTE) Elevated high sensitivity troponin I (hsTnI) values and significant  changes across serial measurements may suggest ACS but many other  chronic and acute conditions are known to elevate hsTnI results.  Refer to the "Links" section for chest pain algorithms and additional  guidance. Performed at [facility], [place] [address], [place], [place] [address]   CBC with Differential     Status: Abnormal   Collection Time: 05/13/20 10:56 AM  Result Value Ref Range   WBC 10.9 (H) 4.0 - 10.5 K/uL   RBC 3.25 (L) 4.22 - 5.81 MIL/uL   Hemoglobin 10.0 (L) 13.0 - 17.0 g/dL   HCT 30.9 (L) 39.0 - 52.0 %   MCV 95.1 80.0 - 100.0 fL   MCH 30.8 26.0 - 34.0 pg   MCHC 32.4 30.0 - 36.0 g/dL   RDW 16.8 (H) 11.5 - 15.5 %   Platelets 286 150 - 400 K/uL   nRBC 0.0 0.0 - 0.2 %   Neutrophils Relative % 46 %   Neutro Abs 5.1 1.7 - 7.7 K/uL   Lymphocytes Relative 36 %   Lymphs Abs 3.9  0.7 - 4.0 K/uL   Monocytes Relative 9 %   Monocytes Absolute 1.0 0.1 - 1.0 K/uL   Eosinophils Relative 7 %   Eosinophils Absolute 0.8 (H) 0.0 - 0.5 K/uL   Basophils Relative 1 %   Basophils Absolute 0.1 0.0 - 0.1 K/uL   Immature Granulocytes 1 %   Abs Immature Granulocytes 0.06 0.00 - 0.07 K/uL    Comment: Performed at [facility] [address], [place], [place] [address]  Comprehensive metabolic panel     Status: Abnormal   Collection Time: 05/13/20 10:56 AM  Result Value Ref Range   Sodium 137 135 - 145 mmol/L   Potassium 4.3 3.5 - 5.1 mmol/L   Chloride 108 98 - 111 mmol/L   CO2 20 (L) 22 - 32 mmol/L   Glucose, Bld 99  70 - 99 mg/dL    Comment: Glucose reference range applies only to samples taken after fasting for at least 8 hours.   BUN 57 (H) 8 - 23 mg/dL   Creatinine, Ser 2.63 (H) 0.61 - 1.24 mg/dL   Calcium 8.2 (L) 8.9 - 10.3 mg/dL   Total Protein 6.1 (L) 6.5 - 8.1 g/dL   Albumin 2.5 (L) 3.5 - 5.0 g/dL   AST 37 15 - 41 U/L   ALT 22 0 - 44 U/L   Alkaline Phosphatase 122 38 - 126 U/L   Total Bilirubin 0.6 0.3 - 1.2 mg/dL   GFR, Estimated 23 (L) >60 mL/min    Comment: (NOTE) Calculated using the CKD-EPI Creatinine Equation (2021)    Anion gap 9 5 - 15    Comment: Performed at [facility], [place] [address] [place],  [address]  Resp Panel by RT-PCR (Flu A&B, Covid) Nasopharyngeal Swab     Status: None   Collection Time: 05/13/20 10:56 AM   Specimen: Nasopharyngeal Swab; Nasopharyngeal(NP) swabs in vial transport medium  Result Value Ref Range   SARS Coronavirus 2 by RT PCR NEGATIVE NEGATIVE    Comment: (NOTE) SARS-CoV-2 target nucleic acids are NOT DETECTED.  The SARS-CoV-2 RNA is generally detectable in upper respiratory specimens during the acute phase of infection. The lowest concentration of SARS-CoV-2 viral copies this assay can detect is 138 copies/mL. A negative result does not preclude SARS-Cov-2 infection and should not be used as the sole basis for treatment or other patient management decisions. A negative result may occur with  improper specimen collection/handling, submission of specimen other than nasopharyngeal swab, presence of viral mutation(s) within the areas targeted by this assay, and inadequate number of viral copies(<138 copies/mL). A negative result must be combined with clinical observations, patient history, and epidemiological information. The expected result is Negative.  Fact Sheet for Patients:  EntrepreneurPulse.com.au  Fact Sheet for Healthcare Providers:  IncredibleEmployment.be  This test is no t yet  approved or cleared by the [place] FDA and  has been authorized for detection and/or diagnosis of SARS-CoV-2 by FDA under an Emergency Use Authorization (EUA). This EUA will remain  in effect (meaning this test can be used) for the duration of the COVID-19 declaration under Section 564(b)(1) of the Act, 21 U.S.C.section 360bbb-3(b)(1), unless the authorization is terminated  or revoked sooner.       Influenza A by PCR NEGATIVE NEGATIVE   Influenza B by PCR NEGATIVE NEGATIVE    Comment: (NOTE) The Xpert Xpress SARS-CoV-2/FLU/RSV plus assay is intended as an aid in the diagnosis of influenza from Nasopharyngeal swab specimens and should not  be used as a sole basis for treatment. Nasal washings and aspirates are unacceptable for Xpert Xpress SARS-CoV-2/FLU/RSV testing.  Fact Sheet for Patients: EntrepreneurPulse.com.au  Fact Sheet for Healthcare Providers: IncredibleEmployment.be  This test is not yet approved or cleared by the [place] FDA and has been authorized for detection and/or diagnosis of SARS-CoV-2 by FDA under an Emergency Use Authorization (EUA). This EUA will remain in effect (meaning this test can be used) for the duration of the COVID-19 declaration under Section 564(b)(1) of the Act, 21 U.S.C. section 360bbb-3(b)(1), unless the authorization is terminated or revoked.  Performed at [facility], [place] [address], [place], [place] [address]   Troponin I (High Sensitivity)     Status: Abnormal   Collection Time: 05/13/20 12:43 PM  Result Value Ref Range   Troponin I (High Sensitivity) 31 (H) <18 ng/L    Comment: (NOTE) Elevated high sensitivity troponin I (hsTnI) values and significant  changes across serial measurements may suggest ACS but many other  chronic and acute conditions are known to elevate hsTnI results.  Refer to the "Links" section for chest pain algorithms and additional  guidance. Performed at  [facility], [place] [address] [place], [place] [address]   Urinalysis, Routine w reflex microscopic Urine, Clean Catch     Status: Abnormal   Collection Time: 05/13/20  4:58 PM  Result Value Ref Range   Color, Urine STRAW (A) YELLOW   APPearance CLEAR CLEAR   Specific Gravity, Urine 1.006 1.005 - 1.030   pH 5.0 5.0 - 8.0   Glucose, UA NEGATIVE NEGATIVE mg/dL   Hgb urine dipstick NEGATIVE NEGATIVE   Bilirubin Urine NEGATIVE NEGATIVE   Ketones, ur NEGATIVE NEGATIVE mg/dL   Protein, ur 30 (A) NEGATIVE mg/dL   Nitrite NEGATIVE NEGATIVE   Leukocytes,Ua NEGATIVE NEGATIVE   RBC / HPF 0-5 0 - 5 RBC/hpf   WBC, UA 0-5 0 - 5 WBC/hpf   Bacteria, UA NONE SEEN NONE SEEN   Squamous Epithelial / LPF 0-5 0 - 5   Mucus PRESENT     Comment: Performed at [facility], [place] [address] [place], [place] [address]  Procalcitonin - Baseline     Status: None   Collection Time: 05/13/20  6:22 PM  Result Value Ref Range   Procalcitonin 0.25 ng/mL    Comment:        Interpretation: PCT (Procalcitonin) <= 0.5 ng/mL: Systemic infection (sepsis) is not likely. Local bacterial infection is possible. (NOTE)       Sepsis PCT Algorithm           Lower Respiratory Tract                                      Infection PCT Algorithm    ----------------------------     ----------------------------         PCT < 0.25 ng/mL                PCT < 0.10 ng/mL          Strongly encourage             Strongly discourage   discontinuation of antibiotics    initiation of antibiotics    ----------------------------     -----------------------------       PCT 0.25 - 0.50 ng/mL            PCT  0.10 - 0.25 ng/mL               OR       >80% decrease in PCT            Discourage initiation of                                            antibiotics      Encourage discontinuation           of antibiotics    ----------------------------     -----------------------------         PCT >= 0.50 ng/mL              PCT 0.26  - 0.50 ng/mL               AND        <80% decrease in PCT             Encourage initiation of                                             antibiotics       Encourage continuation           of antibiotics    ----------------------------     -----------------------------        PCT >= 0.50 ng/mL                  PCT > 0.50 ng/mL               AND         increase in PCT                  Strongly encourage                                      initiation of antibiotics    Strongly encourage escalation           of antibiotics                                     -----------------------------                                           PCT <= 0.25 ng/mL                                                 OR                                        > 80% decrease in PCT  Discontinue / Do not initiate                                             antibiotics  Performed at [facility], [place] [address], [place], [place] [address]     Medications and allergies   Allergies  Allergen Reactions  . Lisinopril Cough  . Nsaids Other (See Comments)    GI bleed(s)      Current Meds  Medication Sig  . fexofenadine (ALLEGRA) 180 MG tablet Take 180 mg by mouth in the morning.  . furosemide (LASIX) 20 MG tablet Take 1 tablet (20 mg total) by mouth every other day. (Patient taking differently: Take 20 mg by mouth in the morning and at bedtime.)  . hydrALAZINE (APRESOLINE) 50 MG tablet Take 1 tablet (50 mg total) by mouth 3 (three) times daily.  . isosorbide dinitrate (ISORDIL) 30 MG tablet Take 1 tablet (30 mg total) by mouth 3 (three) times daily.  Marland Kitchen lactulose (CHRONULAC) 10 GM/15ML solution Take 15 mLs (10 g total) by mouth daily as needed for mild constipation.  . metoprolol succinate (TOPROL-XL) 25 MG 24 hr tablet Take 1 tablet (25 mg total) by mouth daily.  Marland Kitchen NIFEdipine (ADALAT CC) 60 MG 24 hr tablet Take 60 mg by mouth at bedtime.  . pantoprazole (PROTONIX)  40 MG tablet Take 1 tablet (40 mg total) by mouth 2 (two) times daily before a meal.  . Probiotic Product (PROBIOTIC DAILY PO) Take 1 capsule by mouth in the morning.  . simvastatin (ZOCOR) 40 MG tablet Take 40 mg by mouth at bedtime.  . tamsulosin (FLOMAX) 0.4 MG CAPS capsule Take 0.4 mg by mouth daily after supper.  . Travoprost, BAK Free, (TRAVATAN) 0.004 % SOLN ophthalmic solution Place 1 drop into both eyes every evening.    Scheduled Meds: . acidophilus  1 capsule Oral q AM  . albuterol  2.5 mg Nebulization BID  . furosemide  20 mg Intravenous BID  . guaiFENesin  600 mg Oral BID  . heparin  5,000 Units Subcutaneous Q8H  . hydrALAZINE  50 mg Oral TID  . isosorbide dinitrate  30 mg Oral TID  . latanoprost  1 drop Both Eyes QHS  . loratadine  10 mg Oral Daily  . metoprolol succinate  25 mg Oral Daily  . NIFEdipine  60 mg Oral QHS  . simvastatin  40 mg Oral QHS  . sodium chloride flush  3 mL Intravenous Q12H  . tamsulosin  0.4 mg Oral QPC supper   Continuous Infusions: . sodium chloride     PRN Meds:.sodium chloride, acetaminophen, albuterol, lactulose, ondansetron (ZOFRAN) IV, sodium chloride flush   I/O last 3 completed shifts: In: -  Out: 750 [Urine:750] No intake/output data recorded.    Radiology:   DG Chest 2 View 05/13/2020 Stable cardiomegaly. No pneumothorax is noted. Stable mild-to-moderate right-sided pleural effusion is noted with associated right basilar atelectasis or infiltrate. Minimal left basilar subsegmental atelectasis is noted with small left pleural effusion. Bony thorax is unremarkable.  IMPRESSION: Stable moderate size right pleural effusion is noted with associated atelectasis or infiltrate. Stable left basilar opacities as well.   NM PULMONARY VENT AND PERF (V/Q Scan) 05/13/2020  No peripheral wedge-shaped perfusion defects to suggest pulmonary embolus. Slight volume loss in the right hemithorax is due to right pleural effusion. Linear area of  photopenia in the right lung likely corresponds to fluid in the fissure when correlated with radiograph.  IMPRESSION: No scintigraphic findings of pulmonary embolus.  Cardiac Studies:   Echocardiogram 03/09/2016:  Left ventricle: The cavity size was normal. Wall thickness was  increased in a pattern of mild LVH. Systolic function was  vigorous. The estimated ejection fraction was in the range of 65%  to 70%. Wall motion was normal; there were no regional wall  motion abnormalities. Doppler parameters are consistent with abnormal left ventricular relaxation (grade 1 diastolic dysfunction).  - Aortic valve: Trileaflet; mildly thickened, mildly calcified leaflets.  - Left atrium: The atrium was mildly dilated  Long Term Monitor 11/02/2019:  Max 139 bpm 09:19am, 09/18 Min 31 bpm 05:31am, 09/15 Avg 71 bpm 1.4% PACs 3.2% PVCs Predominant rhythm was sinus rhythm No prolonged arrhythmias noted Multiple episodes of complete AV block with a narrow QRS escape occurred between 5-5:30 AM  Echocardiogram 04/06/2020:  1. Left ventricular ejection fraction, by estimation, is 35 to 40%. The  left ventricle has moderately decreased function. The left ventricle has  no regional wall motion abnormalities. Left ventricular diastolic  parameters are consistent with Grade I  diastolic dysfunction (impaired relaxation).  2. Right ventricular systolic function is normal. The right ventricular  size is normal. There is normal pulmonary artery systolic pressure.  3. The mitral valve is normal in structure. No evidence of mitral valve  regurgitation. No evidence of mitral stenosis.  4. The aortic valve is tricuspid. Aortic valve regurgitation is mild.  Mild to moderate aortic valve sclerosis/calcification is present, without  any evidence of aortic stenosis.  5. The inferior vena cava is normal in size with greater than 50%  respiratory variability, suggesting right atrial pressure of 3  mmHg.  Upper Extremity Venous Ultrasound 04/14/2020:  Right:  No evidence of deep vein thrombosis in the upper extremity. No evidence of  superficial vein thrombosis in the upper extremity.    Left:  No evidence of deep vein thrombosis in the upper extremity. No evidence of  superficial vein thrombosis in the upper extremity.    VAS [name] LOWER EXTREMITY VENOUS (DVT) (ONLY [facility] & [facility]) 05/13/2020 RIGHT: - There is no evidence of deep vein thrombosis in the lower extremity.  - No cystic structure found in the popliteal fossa.  EKG:  05/13/2020: Sinus rhythm at a rate of 66 bpm.  Normal axis.  Poor R wave progression, cannot exclude anteroseptal infarct old.  05/05/2020: Sinus rhythm with first degree AV block at a rate of 64 bpm.  Left axis, left anterior fascicular block.  Poor R wave progression, cannot exclude anteroseptal infarct old.  Nonspecific T wave abnormality.  Assessment   Ulus Ridwan Bondy  is a 87 y.o. male with history of stroke in 2015 with mild residual left facial and left upper extremity weakness, difficult to control hypertension, hyperlipidemia, sinus node dysfunction, GI bleed, chronic combined systolic and diastolic heart failure, chronic kidney disease. Presents with acute on chronic heart failure.   1.  Acute on chronic combined systolic diastolic heart failure 2.  Stage IV chronic kidney disease 3.  Elevated troponin 4.  Essential hypertension 5.  Dyslipidemia  Recommendations:   1.  Acute on chronic combined systolic diastolic heart failure Patient with history of chronic diastolic heart failure with new onset systolic heart failure diagnosed last month during admission for acute upper GI bleed, echocardiogram at that time revealed LVEF of 35-40%. Patient now presents with dyspnea and orthopnea secondary to  volume overload. Will continue gentle diuresis and will monitor renal function closely given recent worsening of creatinine and GFR.   Etiology of systolic heart  failure is unclear, differential includes stress cardiomyopathy in the setting of acute GI bleed and AKI however ischemia cannot be ruled out. In view of patient worsening renal function, acute heart failure and advance age patient is not a candidate for heart catheterization.   Increase Lasix to 40 mg IV BID.   2.  Stage IV chronic kidney disease Continue gentle diuresis, monitor renal function closely. Consult nephrology for assistance in diuresis in the setting of worsening renal function.  3.  Elevated troponin Mildly elevated, but flat troponin likely secondary to volume overload and acute on chronic heart failure.  Patient denies chest pain and EKG is unchanged compared to previous.    4.  Essential hypertension Well-controlled on home medications.  Continue hydralazine, isosorbide mononitrate, metoprolol, nifedipine.  5.  Dyslipidemia Continue simvastatin.  Management of other chronic comorbidities per primary team.    Overall in view of patient's advanced age as well as worsening cardiac status and renal function feel palliative care consult is appropriate.  Patient is not a candidate for further invasive cardiac testing nor is he likely candidate for hemodialysis.  Notably patient also has dementia which appears to be worsening since recent hospitalization last month.  Counseled patient regarding worsening cardiac and renal status and focusing on symptomatic treatment.  Will discuss further with patient's family as well.  Thank you for this consultation and allowing [name] to participate in patient's care.   Alethia Berthold, PA-C 05/14/2020, 9:18 AM Office: [phone]

## 2020-05-14 NOTE — Plan of Care (Signed)
  Problem: Clinical Measurements: Goal: Ability to maintain clinical measurements within normal limits will improve Outcome: Progressing Goal: Will remain free from infection Outcome: Progressing Goal: Diagnostic test results will improve Outcome: Progressing Goal: Respiratory complications will improve Outcome: Progressing Goal: Cardiovascular complication will be avoided Outcome: Progressing   Problem: Activity: Goal: Risk for activity intolerance will decrease Outcome: Progressing   Problem: Nutrition: Goal: Adequate nutrition will be maintained Outcome: Progressing   Problem: Coping: Goal: Level of anxiety will decrease Outcome: Progressing   Problem: Elimination: Goal: Will not experience complications related to bowel motility Outcome: Progressing Goal: Will not experience complications related to urinary retention Outcome: Progressing   Problem: Pain Managment: Goal: General experience of comfort will improve Outcome: Progressing   Problem: Safety: Goal: Ability to remain free from injury will improve Outcome: Progressing   Problem: Skin Integrity: Goal: Risk for impaired skin integrity will decrease Outcome: Progressing   Problem: Activity: Goal: Capacity to carry out activities will improve Outcome: Progressing   Problem: Cardiac: Goal: Ability to achieve and maintain adequate cardiopulmonary perfusion will improve Outcome: Progressing

## 2020-05-14 NOTE — Progress Notes (Signed)
PROGRESS NOTE    Brian Wolf  ZJI:[id number] DOB: Mar 31, 1931 DOA: 05/13/2020 PCP: [name], DO   Brief Narrative:  87 year old with history of HTN, CVA, sinus node dysfunction status post pacemaker, CKD stage IV, GI bleed from duodenal ulcer, anemia admitted for shortness of breath and weight gain.  Recently hospitalized 4 weeks ago for GI bleed related to duodenal ulcer requiring blood transfusion and possible concerns of aspiration pneumonia Proteus UTI upon admission VQ scan performed which is negative for PE.   Assessment & Plan:   Principal Problem:   CHF (congestive heart failure) (HCC) Active Problems:   Hypertension, essential   Obstructive sleep apnea syndrome   Pleural effusion   Benign prostatic hyperplasia   Dyslipidemia   Bronchitis with acute wheezing   Leukocytosis   Elevated troponin   CKD (chronic kidney disease), stage IV (HCC)   DNR (do not resuscitate)  Acute congestive heart failure with reduced ejection fraction EF 35%, grade 1 DD, class IV Right-sided pleural effusion -Cardiology team consulted -Strict input and output.  Daily weight -Lasix 40 mg IV twice daily -BNP 677 -Continue hydralazine 50 mg 3 times daily, isosorbide dinitrate 30 mg 3 times daily, Toprol-XL 25 mg daily  Acute bronchitis with mild wheezing abnormal breath sounds -Currently on bronchodilators.  Continue prednisone 40 mg orally daily -Mucinex -Procalcitonin-0.25  Essential hypertension -Continue hydralazine, isosorbide mononitrate, metoprolol and nifedipine  CKD stage IV -Renal function around baseline.  Creatinine 2.5.  Normocytic anemia -Hemoglobin around baseline 9.0  Hyperlipidemia -Zocor 40 mg daily  BPH -Flomax  Obstructive sleep apnea -CPAP at bedtime  GERD with duodenal ulcer and recent GI bleed -PPI BID    DVT prophylaxis: SQ Heparin Code Status: DNR Family Communication:  Called his wife, no answer. vm has not been set up for me to leave  one.     Dispo: The patient is from: Home              Anticipated d/c is to: Home              Patient currently is not medically stable to d/c. Cont hosp stay for IV lasix. Home once cleared by cardiology.    Difficult to place patient No  Subjective: Overall somewhat better but still has DOE.  Forgetful about yesterday's events.   Review of Systems Otherwise negative except as per HPI, including: General: Denies fever, chills, night sweats or unintended weight loss. Resp: Denies cough, wheezing, shortness of breath. Cardiac: Denies chest pain, palpitations, orthopnea, paroxysmal nocturnal dyspnea. GI: Denies abdominal pain, nausea, vomiting, diarrhea or constipation GU: Denies dysuria, frequency, hesitancy or incontinence MS: Denies muscle aches, joint pain or swelling Neuro: Denies headache, neurologic deficits (focal weakness, numbness, tingling), abnormal gait Psych: Denies anxiety, depression, SI/HI/AVH Skin: Denies new rashes or lesions ID: Denies sick contacts, exotic exposures, travel  Examination:  General exam: Appears calm and comfortable  Respiratory system: Bibasilar crackles with mild expiratory wheezing Cardiovascular system: S1 & S2 heard, RRR. No JVD, murmurs, rubs, gallops or clicks.  1+ bilateral lower extremity pitting edema Gastrointestinal system: Abdomen is nondistended, soft and nontender. No organomegaly or masses felt. Normal bowel sounds heard. Central nervous system: Alert and oriented. No focal neurological deficits. Extremities: Symmetric 5 x 5 power. Skin: No rashes, lesions or ulcers Psychiatry: Judgement and insight appear normal. Mood & affect appropriate.     Objective: Vitals:   05/14/20 0000 05/14/20 0456 05/14/20 0813 05/14/20 0912  BP: 137/63 (!) 123/46  140/63  Pulse: 72 72  73  Resp: 16 18    Temp: 97.7 F (36.5 C) 98.1 F (36.7 C)  98.1 F (36.7 C)  TempSrc: Oral Oral  Oral  SpO2: 94% 94% 96% 95%  Weight:  74.6 kg     Height:        Intake/Output Summary (Last 24 hours) at 05/14/2020 0949 Last data filed at 05/14/2020 0846 Gross per 24 hour  Intake 200 ml  Output 750 ml  Net -550 ml   Filed Weights   05/13/20 1038 05/13/20 1559 05/14/20 0456  Weight: 74.8 kg 74 kg 74.6 kg     Data Reviewed:   CBC: Recent Labs  Lab 05/13/20 1056 05/14/20 0550  WBC 10.9* 4.7  NEUTROABS 5.1  --   HGB 10.0* 10.5*  HCT 30.9* 32.8*  MCV 95.1 95.9  PLT 286 643   Basic Metabolic Panel: Recent Labs  Lab 05/13/20 1056 05/14/20 0550  NA 137 137  K 4.3 4.5  CL 108 107  CO2 20* 20*  GLUCOSE 99 149*  BUN 57* 60*  CREATININE 2.63* 2.58*  CALCIUM 8.2* 8.5*   GFR: Estimated Creatinine Clearance: 19 mL/min (A) (by C-G formula based on SCr of 2.58 mg/dL (H)). Liver Function Tests: Recent Labs  Lab 05/13/20 1056  AST 37  ALT 22  ALKPHOS 122  BILITOT 0.6  PROT 6.1*  ALBUMIN 2.5*   No results for input(s): LIPASE, AMYLASE in the last 168 hours. No results for input(s): AMMONIA in the last 168 hours. Coagulation Profile: No results for input(s): INR, PROTIME in the last 168 hours. Cardiac Enzymes: No results for input(s): CKTOTAL, CKMB, CKMBINDEX, TROPONINI in the last 168 hours. BNP (last 3 results) No results for input(s): PROBNP in the last 8760 hours. HbA1C: No results for input(s): HGBA1C in the last 72 hours. CBG: No results for input(s): GLUCAP in the last 168 hours. Lipid Profile: No results for input(s): CHOL, HDL, LDLCALC, TRIG, CHOLHDL, LDLDIRECT in the last 72 hours. Thyroid Function Tests: No results for input(s): TSH, T4TOTAL, FREET4, T3FREE, THYROIDAB in the last 72 hours. Anemia Panel: No results for input(s): VITAMINB12, FOLATE, FERRITIN, TIBC, IRON, RETICCTPCT in the last 72 hours. Sepsis Labs: Recent Labs  Lab 05/13/20 1822  PROCALCITON 0.25    Recent Results (from the past 240 hour(s))  Resp Panel by RT-PCR (Flu A&B, Covid) Nasopharyngeal Swab     Status: None    Collection Time: 05/13/20 10:56 AM   Specimen: Nasopharyngeal Swab; Nasopharyngeal(NP) swabs in vial transport medium  Result Value Ref Range Status   SARS Coronavirus 2 by RT PCR NEGATIVE NEGATIVE Final    Comment: (NOTE) SARS-CoV-2 target nucleic acids are NOT DETECTED.  The SARS-CoV-2 RNA is generally detectable in upper respiratory specimens during the acute phase of infection. The lowest concentration of SARS-CoV-2 viral copies this assay can detect is 138 copies/mL. A negative result does not preclude SARS-Cov-2 infection and should not be used as the sole basis for treatment or other patient management decisions. A negative result may occur with  improper specimen collection/handling, submission of specimen other than nasopharyngeal swab, presence of viral mutation(s) within the areas targeted by this assay, and inadequate number of viral copies(<138 copies/mL). A negative result must be combined with clinical observations, patient history, and epidemiological information. The expected result is Negative.  Fact Sheet for Patients:  EntrepreneurPulse.com.au  Fact Sheet for Healthcare Providers:  IncredibleEmployment.be  This test is no t yet approved or cleared by the [place]  FDA and  has been authorized for detection and/or diagnosis of SARS-CoV-2 by FDA under an Emergency Use Authorization (EUA). This EUA will remain  in effect (meaning this test can be used) for the duration of the COVID-19 declaration under Section 564(b)(1) of the Act, 21 U.S.C.section 360bbb-3(b)(1), unless the authorization is terminated  or revoked sooner.       Influenza A by PCR NEGATIVE NEGATIVE Final   Influenza B by PCR NEGATIVE NEGATIVE Final    Comment: (NOTE) The Xpert Xpress SARS-CoV-2/FLU/RSV plus assay is intended as an aid in the diagnosis of influenza from Nasopharyngeal swab specimens and should not be used as a sole basis for treatment.  Nasal washings and aspirates are unacceptable for Xpert Xpress SARS-CoV-2/FLU/RSV testing.  Fact Sheet for Patients: EntrepreneurPulse.com.au  Fact Sheet for Healthcare Providers: IncredibleEmployment.be  This test is not yet approved or cleared by the [place] FDA and has been authorized for detection and/or diagnosis of SARS-CoV-2 by FDA under an Emergency Use Authorization (EUA). This EUA will remain in effect (meaning this test can be used) for the duration of the COVID-19 declaration under Section 564(b)(1) of the Act, 21 U.S.C. section 360bbb-3(b)(1), unless the authorization is terminated or revoked.  Performed at [facility], [place] [address] [place], [place] [address]          Radiology Studies: DG Chest 2 View  Result Date: 05/13/2020 CLINICAL DATA:  Shortness of breath. EXAM: CHEST - 2 VIEW COMPARISON:  Jul 01, 2020. FINDINGS: Stable cardiomegaly. No pneumothorax is noted. Stable mild-to-moderate right-sided pleural effusion is noted with associated right basilar atelectasis or infiltrate. Minimal left basilar subsegmental atelectasis is noted with small left pleural effusion. Bony thorax is unremarkable. IMPRESSION: Stable moderate size right pleural effusion is noted with associated atelectasis or infiltrate. Stable left basilar opacities as well. Electronically Signed   By: Marijo Conception M.D.   On: 05/13/2020 11:55   NM PULMONARY VENT AND PERF (V/Q Scan)  Result Date: 05/13/2020 CLINICAL DATA:  PE suspected, high prob Lower extremity swelling, dyspnea and wheezing. EXAM: NUCLEAR MEDICINE PERFUSION LUNG SCAN TECHNIQUE: Perfusion images were obtained in multiple projections after intravenous injection of radiopharmaceutical. Ventilation scans intentionally deferred if perfusion scan and chest x-ray adequate for interpretation during COVID 19 epidemic. RADIOPHARMACEUTICALS:  4.25 mCi Tc-59m MAA IV COMPARISON:  Radiograph  earlier today. FINDINGS: No peripheral wedge-shaped perfusion defects to suggest pulmonary embolus. Slight volume loss in the right hemithorax is due to right pleural effusion. Linear area of photopenia in the right lung likely corresponds to fluid in the fissure when correlated with radiograph. IMPRESSION: No scintigraphic findings of pulmonary embolus. Electronically Signed   By: Keith Rake M.D.   On: 05/13/2020 17:15   VAS [name] LOWER EXTREMITY VENOUS (DVT) (ONLY [facility] & [facility])  Result Date: 05/13/2020  Lower Venous DVT Study Indications: Edema.  Comparison Study: no prior Performing Technologist: Abram Sander RVS  Examination Guidelines: A complete evaluation includes B-mode imaging, spectral Doppler, color Doppler, and power Doppler as needed of all accessible portions of each vessel. Bilateral testing is considered an integral part of a complete examination. Limited examinations for reoccurring indications may be performed as noted. The reflux portion of the exam is performed with the patient in reverse Trendelenburg.  +---------+---------------+---------+-----------+----------+--------------+ RIGHT    CompressibilityPhasicitySpontaneityPropertiesThrombus Aging +---------+---------------+---------+-----------+----------+--------------+ CFV      Full           Yes      Yes                                 +---------+---------------+---------+-----------+----------+--------------+  SFJ      Full                                                        +---------+---------------+---------+-----------+----------+--------------+ FV Prox  Full                                                        +---------+---------------+---------+-----------+----------+--------------+ FV Mid   Full                                                        +---------+---------------+---------+-----------+----------+--------------+ FV DistalFull                                                         +---------+---------------+---------+-----------+----------+--------------+ PFV      Full                                                        +---------+---------------+---------+-----------+----------+--------------+ POP      Full           Yes      Yes                                 +---------+---------------+---------+-----------+----------+--------------+ PTV      Full                                                        +---------+---------------+---------+-----------+----------+--------------+ PERO     Full                                                        +---------+---------------+---------+-----------+----------+--------------+   +----+---------------+---------+-----------+----------+-------------------+ LEFTCompressibilityPhasicitySpontaneityPropertiesThrombus Aging      +----+---------------+---------+-----------+----------+-------------------+ CFV                                              Not well visualized +----+---------------+---------+-----------+----------+-------------------+     Summary: RIGHT: - There is no evidence of deep vein thrombosis in the lower extremity.  - No cystic structure found in the popliteal fossa.   *See table(s) above for measurements and observations. Electronically signed by Deitra Mayo MD on 05/13/2020 at 1:50:03 PM.  Final         Scheduled Meds: . acidophilus  1 capsule Oral q AM  . albuterol  2.5 mg Nebulization BID  . furosemide  40 mg Intravenous BID  . guaiFENesin  600 mg Oral BID  . heparin  5,000 Units Subcutaneous Q8H  . hydrALAZINE  50 mg Oral TID  . isosorbide dinitrate  30 mg Oral TID  . latanoprost  1 drop Both Eyes QHS  . loratadine  10 mg Oral Daily  . metoprolol succinate  25 mg Oral Daily  . NIFEdipine  60 mg Oral QHS  . simvastatin  40 mg Oral QHS  . sodium chloride flush  3 mL Intravenous Q12H  . tamsulosin  0.4 mg Oral QPC supper   Continuous Infusions: . sodium  chloride       LOS: 0 days   Time spent= 35 mins    Chirstina Haan Arsenio Loader, MD [facility]  If 7PM-7AM, please contact night-coverage  05/14/2020, 9:49 AM

## 2020-05-14 NOTE — Progress Notes (Signed)
Spoke with patient's wife about HCPOA issues.  Wife brought HCPOA paperwork, copies made and put in pt's chart.  RN updated HCPOA information in Epic. Password changed to limit availability to pt information.

## 2020-05-15 ENCOUNTER — Other Ambulatory Visit (HOSPITAL_COMMUNITY): Payer: Self-pay

## 2020-05-15 DIAGNOSIS — J40 Bronchitis, not specified as acute or chronic: Secondary | ICD-10-CM

## 2020-05-15 DIAGNOSIS — I4891 Unspecified atrial fibrillation: Secondary | ICD-10-CM

## 2020-05-15 LAB — BASIC METABOLIC PANEL
Anion gap: 9 (ref 5–15)
BUN: 69 mg/dL — ABNORMAL HIGH (ref 8–23)
CO2: 21 mmol/L — ABNORMAL LOW (ref 22–32)
Calcium: 8.3 mg/dL — ABNORMAL LOW (ref 8.9–10.3)
Chloride: 107 mmol/L (ref 98–111)
Creatinine, Ser: 2.94 mg/dL — ABNORMAL HIGH (ref 0.61–1.24)
GFR, Estimated: 20 mL/min — ABNORMAL LOW (ref >60)
Glucose, Bld: 104 mg/dL — ABNORMAL HIGH (ref 70–99)
Potassium: 4.4 mmol/L (ref 3.5–5.1)
Sodium: 137 mmol/L (ref 135–145)

## 2020-05-15 LAB — CBC
HCT: 28.8 % — ABNORMAL LOW (ref 39.0–52.0)
Hemoglobin: 9.4 g/dL — ABNORMAL LOW (ref 13.0–17.0)
MCH: 30.6 pg (ref 26.0–34.0)
MCHC: 32.6 g/dL (ref 30.0–36.0)
MCV: 93.8 fL (ref 80.0–100.0)
Platelets: 305 10*3/uL (ref 150–400)
RBC: 3.07 MIL/uL — ABNORMAL LOW (ref 4.22–5.81)
RDW: 16.7 % — ABNORMAL HIGH (ref 11.5–15.5)
WBC: 13.3 10*3/uL — ABNORMAL HIGH (ref 4.0–10.5)
nRBC: 0 % (ref 0.0–0.2)

## 2020-05-15 LAB — MAGNESIUM: Magnesium: 1.7 mg/dL (ref 1.7–2.4)

## 2020-05-15 MED ORDER — FUROSEMIDE 10 MG/ML IJ SOLN: 40.0000 mg | Freq: Every day | INTRAMUSCULAR | Status: DC

## 2020-05-15 MED ORDER — ALBUTEROL SULFATE (2.5 MG/3ML) 0.083% IN NEBU: 2.5000 mg | INHALATION_SOLUTION | Freq: Two times a day (BID) | RESPIRATORY_TRACT | Status: DC | PRN

## 2020-05-15 MED ORDER — APIXABAN 2.5 MG PO TABS
2.5000 mg | ORAL_TABLET | Freq: Two times a day (BID) | ORAL | Status: DC
  Administered 2020-05-15: 2.5 mg via ORAL
  Filled 2020-05-15: qty 1

## 2020-05-15 NOTE — Progress Notes (Signed)
Subjective:  Patient seen and examined at bedside at approximately 8:00 AM.  He is awake, alert, and resting comfortably.  Creatinine trended up from 2.58 to 2.94 and GFR down from 24 to 20 with increased Lasix dosing.  Patient is net -1L since admission. Right arm swelling has improved, however bilateral lower leg edema has only minimally improved.  Patient has expiratory wheezes bilaterally throughout worse than yesterday.  Patient reports his shortness of breath has improved. Denies chest pain, palpitations, dizziness.  Review of telemetry revealed paroxysmal atrial fibrillation.   Intake/Output from previous day:  I/O last 3 completed shifts: In: 200 [P.O.:200] Out: 1225 [Urine:1225] No intake/output data recorded.  Blood pressure (!) 130/58, pulse 68, temperature 98.3 F (36.8 C), temperature source Oral, resp. rate 16, height '5\' 4"'  (1.626 m), weight 72.3 kg, SpO2 93 %. Physical Exam Vitals and nursing note reviewed.  Constitutional:      General: He is not in acute distress. HENT:     Head: Normocephalic and atraumatic.     Nose: Nose normal.     Mouth/Throat:     Mouth: Mucous membranes are moist.  Cardiovascular:     Rate and Rhythm: Normal rate and regular rhythm.     Pulses: Intact distal pulses.     Heart sounds: S1 normal and S2 normal. No murmur heard. No gallop.   Pulmonary:     Effort: Pulmonary effort is normal. No respiratory distress.     Breath sounds: Wheezing (bilaterally throughout) present. No rhonchi or rales.  Musculoskeletal:     Right lower leg: Edema present.     Left lower leg: Edema present.  Neurological:     Mental Status: He is alert.     Lab Results: BMP BNP (last 3 results) Recent Labs    04/09/20 0251 04/10/20 0348 05/13/20 1056  BNP 1,085.0* 775.5* 677.4*    ProBNP (last 3 results) No results for input(s): PROBNP in the last 8760 hours. BMP Latest Ref Rng & Units 05/15/2020 05/14/2020 05/13/2020  Glucose 70 - 99 mg/dL 104(H)  149(H) 99  BUN 8 - 23 mg/dL 69(H) 60(H) 57(H)  Creatinine 0.61 - 1.24 mg/dL 2.94(H) 2.58(H) 2.63(H)  Sodium 135 - 145 mmol/L 137 137 137  Potassium 3.5 - 5.1 mmol/L 4.4 4.5 4.3  Chloride 98 - 111 mmol/L 107 107 108  CO2 22 - 32 mmol/L 21(L) 20(L) 20(L)  Calcium 8.9 - 10.3 mg/dL 8.3(L) 8.5(L) 8.2(L)   Hepatic Function Latest Ref Rng & Units 05/13/2020 04/18/2020 04/17/2020  Total Protein 6.5 - 8.1 g/dL 6.1(L) - -  Albumin 3.5 - 5.0 g/dL 2.5(L) 1.9(L) 2.0(L)  AST 15 - 41 U/L 37 - -  ALT 0 - 44 U/L 22 - -  Alk Phosphatase 38 - 126 U/L 122 - -  Total Bilirubin 0.3 - 1.2 mg/dL 0.6 - -   CBC Latest Ref Rng & Units 05/15/2020 05/14/2020 05/13/2020  WBC 4.0 - 10.5 K/uL 13.3(H) 4.7 10.9(H)  Hemoglobin 13.0 - 17.0 g/dL 9.4(L) 10.5(L) 10.0(L)  Hematocrit 39.0 - 52.0 % 28.8(L) 32.8(L) 30.9(L)  Platelets 150 - 400 K/uL 305 312 286   Lipid Panel     Component Value Date/Time   CHOL 147 12/15/2013 0342   TRIG 84 12/15/2013 0342   HDL 54 12/15/2013 0342   CHOLHDL 2.7 12/15/2013 0342   VLDL 17 12/15/2013 0342   LDLCALC 76 12/15/2013 0342   Cardiac Panel (last 3 results) No results for input(s): CKTOTAL, CKMB, TROPONINI, RELINDX in the last 72  hours.  HEMOGLOBIN A1C Lab Results  Component Value Date   HGBA1C 6.4 (H) 12/15/2013   MPG 137 (H) 12/15/2013   TSH Recent Labs    10/20/19 0706 04/05/20 1847  TSH 2.706 4.188   Imaging: DG Chest 2 View 05/13/2020 Stable cardiomegaly. No pneumothorax is noted. Stable mild-to-moderate right-sided pleural effusion is noted with associated right basilar atelectasis or infiltrate. Minimal left basilar subsegmental atelectasis is noted with small left pleural effusion. Bony thorax is unremarkable.  IMPRESSION: Stable moderate size right pleural effusion is noted with associated atelectasis or infiltrate. Stable left basilar opacities as well.   NM PULMONARY VENT AND PERF (V/Q Scan) 05/13/2020  No peripheral wedge-shaped perfusion defects to suggest  pulmonary embolus. Slight volume loss in the right hemithorax is due to right pleural effusion. Linear area of photopenia in the right lung likely corresponds to fluid in the fissure when correlated with radiograph.  IMPRESSION: No scintigraphic findings of pulmonary embolus.  Cardiac Studies: Echocardiogram 03/09/2016:  Left ventricle: The cavity size was normal. Wall thickness was increased in a pattern of mild LVH. Systolic function was  vigorous. The estimated ejection fraction was in the range of 65% to 70%. Wall motion was normal; there were no regional wall  motion abnormalities. Doppler parameters are consistent with abnormal left ventricular relaxation (grade 1 diastolic dysfunction).  - Aortic valve: Trileaflet; mildly thickened, mildly calcified leaflets.  - Left atrium: The atrium was mildly dilated  Long Term Monitor 11/02/2019:  Max 139 bpm 09:19am, 09/18 Min 31 bpm 05:31am, 09/15 Avg 71 bpm 1.4% PACs 3.2% PVCs Predominant rhythm was sinus rhythm No prolonged arrhythmias noted Multiple episodes of complete AV block with a narrow QRS escape occurred between 5-5:30 AM  Echocardiogram 04/06/2020: 1. Left ventricular ejection fraction, by estimation, is 35 to 40%. The  left ventricle has moderately decreased function. The left ventricle has  no regional wall motion abnormalities. Left ventricular diastolic  parameters are consistent with Grade I  diastolic dysfunction (impaired relaxation).  2. Right ventricular systolic function is normal. The right ventricular  size is normal. There is normal pulmonary artery systolic pressure.  3. The mitral valve is normal in structure. No evidence of mitral valve  regurgitation. No evidence of mitral stenosis.  4. The aortic valve is tricuspid. Aortic valve regurgitation is mild.  Mild to moderate aortic valve sclerosis/calcification is present, without  any evidence of aortic stenosis.  5. The inferior vena cava  is normal in size with greater than 50%  respiratory variability, suggesting right atrial pressure of 3 mmHg.  Upper Extremity Venous Ultrasound 04/14/2020: Right:  No evidence of deep vein thrombosis in the upper extremity. No evidence of  superficial vein thrombosis in the upper extremity.    Left:  No evidence of deep vein thrombosis in the upper extremity. No evidence of  superficial vein thrombosis in the upper extremity.   VAS [name] LOWER EXTREMITY VENOUS (DVT) (ONLY [facility]) 05/13/2020 RIGHT: - There is no evidence of deep vein thrombosis in the lower extremity.  - No cystic structure found in the popliteal fossa.  Telemetry:  Paroxysmal atrial fibrillation   EKG:  05/13/2020 (repeat): atrial fibrillation with controlled ventricular response at rate of 66 bpm.   05/13/2020: Sinus rhythm at a rate of 66 bpm.  Normal axis.  Poor R wave progression, cannot exclude anteroseptal infarct old.  05/05/2020:Sinus rhythmwith first degree AV blockat a rate of 64 bpm. Left axis, left anterior fascicular block. Poor R wave progression, cannot  exclude anteroseptal infarct old. Nonspecific T wave abnormality.  Scheduled Meds: . acidophilus  1 capsule Oral q AM  . albuterol  2.5 mg Nebulization BID  . furosemide  40 mg Intravenous BID  . guaiFENesin  600 mg Oral BID  . heparin  5,000 Units Subcutaneous Q8H  . hydrALAZINE  50 mg Oral TID  . isosorbide dinitrate  30 mg Oral TID  . latanoprost  1 drop Both Eyes QHS  . loratadine  10 mg Oral Daily  . metoprolol succinate  25 mg Oral Daily  . NIFEdipine  60 mg Oral QHS  . pantoprazole  40 mg Oral BID AC  . simvastatin  40 mg Oral QHS  . sodium chloride flush  3 mL Intravenous Q12H  . tamsulosin  0.4 mg Oral QPC supper   Continuous Infusions: . sodium chloride     PRN Meds:.sodium chloride, acetaminophen, albuterol, lactulose, ondansetron (ZOFRAN) IV, sodium chloride flush  Assessment/Plan:   1.  Acute on chronic combined  systolic diastolic heart failure Patient is responding well to diuresis and symptoms have significantly improved since admission.  Creatinine increased with increased furosemide dosing.  Will reduce Lasix from 40 mg IV twice daily to 40 mg IV once daily. Continue strict I&O's and daily weights. We will likely plan to discharge patient on Lasix 40 mg p.o. twice daily.  2. Paroxysmal atrial fibrillation Further review of repeat EKG on 05/14/2018 2212 patient was in the ER reveals atrial fibrillation with controlled ventricular response.  Review of telemetry overnight also reveals paroxysmal atrial fibrillation with controlled ventricular response. This patients CHA2DS2-VASc Score 6 (CHF, CVA, HTN, age) and yearly risk of stroke 9.8%.  However in view of recent GI bleed would not recommend anticoagulation at this time. Also do not feel he is candidate for LAA closure given advanced age, medical comorbidity, and debility.  Counseled patient and his wife regarding risks vs benefits of anticoagulation. They are in agreement with holding off on anticoagulation.  Originally in view of new onset atrial fibrillation shared decision with patient, family, myself and Dr. Virgina Jock was to proceed with Eliquis. Patient therefore received single dose of Eliquis today. However, upon further review of records from recent hospitalization for GI bleed ultimate decision was to discontinue anticoagulation.   3.  Stage IV chronic kidney disease Continue gentle diuresis, monitor renal function closely. Appreciated nephrology recommendations  Consult nephrology for assistance in diuresis in the setting of worsening renal function.  4.  Elevated troponin Mildly elevated, but flat troponin likely secondary to volume overload and acute on chronic heart failure.   5.  Essential hypertension Well-controlled on home medications.  Continue hydralazine, isosorbide mononitrate, metoprolol, nifedipine.  6.   Dyslipidemia Continue simvastatin.  Discussed at length with patient's wife regarding patient's chronic medical conditions, particularly discussing heart failure and deteriorating renal function in the context of patient's advanced age.  Discussed indication and benefits of proceeding with palliative care consult, patient's wife is agreeable.  Proceed with palliative care consult.  Patient was seen in collaboration with Dr. Virgina Jock. He also reviewed patient's chart and examined the patient. Dr. Virgina Jock is in agreement of the plan.     Alethia Berthold, PA-C 05/15/2020, 7:47 AM Office: [phone]

## 2020-05-15 NOTE — Progress Notes (Signed)
[facility] Progress Note   63 y/ M with history of CKD stage 4,stroke (2015) with mild residual weakness of left facial and left upper extremity weakness, HTN, hyperlipidemia, sinus node dysfunction, Hx of recurrent upper GI bleed 2/2 duodenal ulcer, combined systolic and diastolic heart failure recently diagnosed a month ago (EF35-40%) p/w  weight gain, SOB, leg swelling and admitted for CHF exacerbation.    Assessment/ Plan:   1. CKD stage 4: (GFR 24): new baseline Cr ~2.4-2.6  and pt not far off baseline.  SOB  improved after Lasix and pt comfortable on  RA + ambulating down the hallway. U/A unremarkable except Pr 30. Last renal US was on 04/2020 showed  medical renal disease but no e/o obstruction. He had AKI on CKD (Cr went up to 3.7 that time from his previous baseline of 2) when hospitalized recently for GIB but Cr  then stabilized at 2.5 which may be his new baseline.  - No need to repeat the U/S but will check bladder scan for PVR.  - Even though renal function worsened overnight he still has e/o volume overload and would continue IV lasix for another 24hrs. If renal function is worse tomorrow then will have to back off. His clinical symptoms of dyspnea is much better than at admission.  -Renal function daily, Strict I and Os, daily weights -Fluid restriction to 1.2 L /24hr  2. Acute on Chronic CHF: Diuresing w Lasix. Received Lasix 20 mg BID 4/12  that was increased to 40 mg IV BID by cardiology on 4/13. I/Os:-750  Per cardiology, patient is not a candidate for further invasive cardiac testing.  3. HTN: Controlled on home medications: Hydralazine, Metoprolol, Nifedipine 4. Rt side pleural effusion: She has stabe rt side pleural effusion on CXR (w prior Hx of thoracentesis with removal of 1.2 L of fluid last hospitalization). The fluid was transudate when analyzed   Subjective:   Breathing much better; denies f/c/n/v. Very good appetite.   Objective:   BP (!)  127/55 (BP Location: Right Arm)   Pulse 64   Temp 97.7 F (36.5 C) (Oral)   Resp 18   Ht 5\' 4"  (1.626 m)   Wt 72.3 kg   SpO2 98%   BMI 27.36 kg/m   Intake/Output Summary (Last 24 hours) at 05/15/2020 1137 Last data filed at 05/15/2020 0946 Gross per 24 hour  Intake 100 ml  Output 775 ml  Net -675 ml   Weight change: -2.544 kg  Physical Exam: GEN: NAD, A&Ox3, NCAT HEENT: No conjunctival pallor, EOMI NECK: Supple, no thyromegaly LUNGS: Rales at hte bases CV: RRR, No M/R/G ABD: SNDNT +BS  EXT: 1+ lower extremity edema    Imaging: DG Chest 2 View  Result Date: 05/13/2020 CLINICAL DATA:  Shortness of breath. EXAM: CHEST - 2 VIEW COMPARISON:  Jul 01, 2020. FINDINGS: Stable cardiomegaly. No pneumothorax is noted. Stable mild-to-moderate right-sided pleural effusion is noted with associated right basilar atelectasis or infiltrate. Minimal left basilar subsegmental atelectasis is noted with small left pleural effusion. Bony thorax is unremarkable. IMPRESSION: Stable moderate size right pleural effusion is noted with associated atelectasis or infiltrate. Stable left basilar opacities as well. Electronically Signed   By: Marijo Conception M.D.   On: 05/13/2020 11:55   NM PULMONARY VENT AND PERF (V/Q Scan)  Result Date: 05/13/2020 CLINICAL DATA:  PE suspected, high prob Lower extremity swelling, dyspnea and wheezing. EXAM: NUCLEAR MEDICINE PERFUSION LUNG SCAN TECHNIQUE: Perfusion images were obtained in multiple  projections after intravenous injection of radiopharmaceutical. Ventilation scans intentionally deferred if perfusion scan and chest x-ray adequate for interpretation during COVID 19 epidemic. RADIOPHARMACEUTICALS:  4.25 mCi Tc-46m MAA IV COMPARISON:  Radiograph earlier today. FINDINGS: No peripheral wedge-shaped perfusion defects to suggest pulmonary embolus. Slight volume loss in the right hemithorax is due to right pleural effusion. Linear area of photopenia in the right lung likely  corresponds to fluid in the fissure when correlated with radiograph. IMPRESSION: No scintigraphic findings of pulmonary embolus. Electronically Signed   By: Keith Rake M.D.   On: 05/13/2020 17:15   VAS [name] LOWER EXTREMITY VENOUS (DVT) (ONLY [facility] & [facility])  Result Date: 05/13/2020  Lower Venous DVT Study Indications: Edema.  Comparison Study: no prior Performing Technologist: Abram Sander RVS  Examination Guidelines: A complete evaluation includes B-mode imaging, spectral Doppler, color Doppler, and power Doppler as needed of all accessible portions of each vessel. Bilateral testing is considered an integral part of a complete examination. Limited examinations for reoccurring indications may be performed as noted. The reflux portion of the exam is performed with the patient in reverse Trendelenburg.  +---------+---------------+---------+-----------+----------+--------------+ RIGHT    CompressibilityPhasicitySpontaneityPropertiesThrombus Aging +---------+---------------+---------+-----------+----------+--------------+ CFV      Full           Yes      Yes                                 +---------+---------------+---------+-----------+----------+--------------+ SFJ      Full                                                        +---------+---------------+---------+-----------+----------+--------------+ FV Prox  Full                                                        +---------+---------------+---------+-----------+----------+--------------+ FV Mid   Full                                                        +---------+---------------+---------+-----------+----------+--------------+ FV DistalFull                                                        +---------+---------------+---------+-----------+----------+--------------+ PFV      Full                                                         +---------+---------------+---------+-----------+----------+--------------+ POP      Full           Yes      Yes                                 +---------+---------------+---------+-----------+----------+--------------+  PTV      Full                                                        +---------+---------------+---------+-----------+----------+--------------+ PERO     Full                                                        +---------+---------------+---------+-----------+----------+--------------+   +----+---------------+---------+-----------+----------+-------------------+ LEFTCompressibilityPhasicitySpontaneityPropertiesThrombus Aging      +----+---------------+---------+-----------+----------+-------------------+ CFV                                              Not well visualized +----+---------------+---------+-----------+----------+-------------------+     Summary: RIGHT: - There is no evidence of deep vein thrombosis in the lower extremity.  - No cystic structure found in the popliteal fossa.   *See table(s) above for measurements and observations. Electronically signed by Deitra Mayo MD on 05/13/2020 at 1:50:03 PM.    Final     Labs: BMET Recent Labs  Lab 05/13/20 1056 05/14/20 0550 05/15/20 0355  NA 137 137 137  K 4.3 4.5 4.4  CL 108 107 107  CO2 20* 20* 21*  GLUCOSE 99 149* 104*  BUN 57* 60* 69*  CREATININE 2.63* 2.58* 2.94*  CALCIUM 8.2* 8.5* 8.3*   CBC Recent Labs  Lab 05/13/20 1056 05/14/20 0550 05/15/20 0355  WBC 10.9* 4.7 13.3*  NEUTROABS 5.1  --   --   HGB 10.0* 10.5* 9.4*  HCT 30.9* 32.8* 28.8*  MCV 95.1 95.9 93.8  PLT 286 312 305    Medications:    . acidophilus  1 capsule Oral q AM  . apixaban  2.5 mg Oral BID  . furosemide  40 mg Intravenous Daily  . guaiFENesin  600 mg Oral BID  . hydrALAZINE  50 mg Oral TID  . isosorbide dinitrate  30 mg Oral TID  . latanoprost  1 drop Both Eyes QHS  . loratadine  10 mg  Oral Daily  . metoprolol succinate  25 mg Oral Daily  . NIFEdipine  60 mg Oral QHS  . pantoprazole  40 mg Oral BID AC  . simvastatin  40 mg Oral QHS  . sodium chloride flush  3 mL Intravenous Q12H  . tamsulosin  0.4 mg Oral QPC supper      Otelia Santee, MD 05/15/2020, 11:37 AM

## 2020-05-15 NOTE — Progress Notes (Signed)
Patient refused CPAP for the night. Sp02=95% on room air  

## 2020-05-15 NOTE — Progress Notes (Addendum)
PROGRESS NOTE    Brian Wolf  TDD:[id number] DOB: 05-30-1931 DOA: 05/13/2020 PCP: [name], DO   Brief Narrative:  87 year old with history of HTN, CVA, sinus node dysfunction status post pacemaker, CKD stage IV, GI bleed from duodenal ulcer, anemia admitted for shortness of breath and weight gain.  Recently hospitalized 4 weeks ago for GI bleed related to duodenal ulcer requiring blood transfusion and possible concerns of aspiration pneumonia Proteus UTI upon admission VQ scan performed which is negative for PE.  Nephrology and cardiology team has been consulted.  Due to patient's ongoing and recurrent issues, palliative care team has also been consulted.   Assessment & Plan:   Principal Problem:   CHF (congestive heart failure) (HCC) Active Problems:   Hypertension, essential   Obstructive sleep apnea syndrome   Pleural effusion   Benign prostatic hyperplasia   Dyslipidemia   Bronchitis with acute wheezing   Leukocytosis   Elevated troponin   CKD (chronic kidney disease), stage IV (HCC)   DNR (do not resuscitate)  Acute congestive heart failure with reduced ejection fraction EF 35%, grade 1 DD, class III Right-sided pleural effusion -Symptomatically still feeling shortness of breath at rest but improved compared to yesterday.  Currently on Lasix 40 mg IV daily -Strict input and output.  Daily weight -Diuretic therapy guided by cardiology team. -BNP 677 -Continue hydralazine 50 mg 3 times daily, isosorbide dinitrate 30 mg 3 times daily, Toprol-XL 25 mg daily  Acute bronchitis with mild wheezing abnormal breath sounds, improved -Currently on bronchodilators.   -Mucinex -Procalcitonin-0.25  Essential hypertension -Continue hydralazine, isosorbide mononitrate, metoprolol and nifedipine  CKD stage IV -Renal function around baseline.  Creatinine 2.5.  Normocytic anemia -Hemoglobin around baseline 9.0  Hyperlipidemia -Zocor 40 mg  daily  BPH -Flomax  Obstructive sleep apnea -CPAP at bedtime  GERD with duodenal ulcer and recent GI bleed -PPI BID  Patient has multiple comorbid condition and poor dietary compliance with recurrent admissions.  Palliative care team to help establish goals of care   DVT prophylaxis: SQ Heparin Code Status: DNR Family Communication: Wife at bedside    Dispo: The patient is from: Home              Anticipated d/c is to: Home              Patient currently is not medically stable to d/c. Cont hosp stay for IV lasix. Home once cleared by cardiology.    Difficult to place patient No  Subjective: Still having dyspnea on exertion but feels slightly better compared to yesterday.  Denies any chest pain  Review of Systems Otherwise negative except as per HPI, including: General: Denies fever, chills, night sweats or unintended weight loss. Resp: Denies cough, wheezing, shortness of breath. Cardiac: Denies chest pain, palpitations, orthopnea, paroxysmal nocturnal dyspnea. GI: Denies abdominal pain, nausea, vomiting, diarrhea or constipation GU: Denies dysuria, frequency, hesitancy or incontinence MS: Denies muscle aches, joint pain or swelling Neuro: Denies headache, neurologic deficits (focal weakness, numbness, tingling), abnormal gait Psych: Denies anxiety, depression, SI/HI/AVH Skin: Denies new rashes or lesions ID: Denies sick contacts, exotic exposures, travel  Examination: Constitutional: Not in acute distress Respiratory: Bibasilar crackles Cardiovascular: Normal sinus rhythm, no rubs Abdomen: Nontender nondistended good bowel sounds Musculoskeletal: 1+ bilateral lower extremity pitting edema Skin: No rashes seen Neurologic: CN 2-12 grossly intact.  And nonfocal Psychiatric: Normal judgment and insight. Alert and oriented x 3. Normal mood.    Objective: Vitals:   05/14/20 2335 05/15/20 0405  05/15/20 0903 05/15/20 1158  BP: (!) 122/50 (!) 130/58 (!) 127/55 (!)  135/56  Pulse: 68 68 64 (!) 59  Resp: 18 16 18 18   Temp: 98.5 F (36.9 C) 98.3 F (36.8 C) 97.7 F (36.5 C) (!) 97.4 F (36.3 C)  TempSrc: Oral Oral Oral Oral  SpO2: 94% 93% 98% 96%  Weight:  72.3 kg    Height:        Intake/Output Summary (Last 24 hours) at 05/15/2020 1215 Last data filed at 05/15/2020 0946 Gross per 24 hour  Intake 100 ml  Output 775 ml  Net -675 ml   Filed Weights   05/13/20 1559 05/14/20 0456 05/15/20 0405  Weight: 74 kg 74.6 kg 72.3 kg     Data Reviewed:   CBC: Recent Labs  Lab 05/13/20 1056 05/14/20 0550 05/15/20 0355  WBC 10.9* 4.7 13.3*  NEUTROABS 5.1  --   --   HGB 10.0* 10.5* 9.4*  HCT 30.9* 32.8* 28.8*  MCV 95.1 95.9 93.8  PLT 286 312 341   Basic Metabolic Panel: Recent Labs  Lab 05/13/20 1056 05/14/20 0550 05/15/20 0355  NA 137 137 137  K 4.3 4.5 4.4  CL 108 107 107  CO2 20* 20* 21*  GLUCOSE 99 149* 104*  BUN 57* 60* 69*  CREATININE 2.63* 2.58* 2.94*  CALCIUM 8.2* 8.5* 8.3*  MG  --   --  1.7   GFR: Estimated Creatinine Clearance: 16.4 mL/min (A) (by C-G formula based on SCr of 2.94 mg/dL (H)). Liver Function Tests: Recent Labs  Lab 05/13/20 1056  AST 37  ALT 22  ALKPHOS 122  BILITOT 0.6  PROT 6.1*  ALBUMIN 2.5*   No results for input(s): LIPASE, AMYLASE in the last 168 hours. No results for input(s): AMMONIA in the last 168 hours. Coagulation Profile: No results for input(s): INR, PROTIME in the last 168 hours. Cardiac Enzymes: No results for input(s): CKTOTAL, CKMB, CKMBINDEX, TROPONINI in the last 168 hours. BNP (last 3 results) No results for input(s): PROBNP in the last 8760 hours. HbA1C: No results for input(s): HGBA1C in the last 72 hours. CBG: No results for input(s): GLUCAP in the last 168 hours. Lipid Profile: No results for input(s): CHOL, HDL, LDLCALC, TRIG, CHOLHDL, LDLDIRECT in the last 72 hours. Thyroid Function Tests: No results for input(s): TSH, T4TOTAL, FREET4, T3FREE, THYROIDAB in the  last 72 hours. Anemia Panel: No results for input(s): VITAMINB12, FOLATE, FERRITIN, TIBC, IRON, RETICCTPCT in the last 72 hours. Sepsis Labs: Recent Labs  Lab 05/13/20 1822  PROCALCITON 0.25    Recent Results (from the past 240 hour(s))  Resp Panel by RT-PCR (Flu A&B, Covid) Nasopharyngeal Swab     Status: None   Collection Time: 05/13/20 10:56 AM   Specimen: Nasopharyngeal Swab; Nasopharyngeal(NP) swabs in vial transport medium  Result Value Ref Range Status   SARS Coronavirus 2 by RT PCR NEGATIVE NEGATIVE Final    Comment: (NOTE) SARS-CoV-2 target nucleic acids are NOT DETECTED.  The SARS-CoV-2 RNA is generally detectable in upper respiratory specimens during the acute phase of infection. The lowest concentration of SARS-CoV-2 viral copies this assay can detect is 138 copies/mL. A negative result does not preclude SARS-Cov-2 infection and should not be used as the sole basis for treatment or other patient management decisions. A negative result may occur with  improper specimen collection/handling, submission of specimen other than nasopharyngeal swab, presence of viral mutation(s) within the areas targeted by this assay, and inadequate number of viral copies(<138  copies/mL). A negative result must be combined with clinical observations, patient history, and epidemiological information. The expected result is Negative.  Fact Sheet for Patients:  EntrepreneurPulse.com.au  Fact Sheet for Healthcare Providers:  IncredibleEmployment.be  This test is no t yet approved or cleared by the [place] FDA and  has been authorized for detection and/or diagnosis of SARS-CoV-2 by FDA under an Emergency Use Authorization (EUA). This EUA will remain  in effect (meaning this test can be used) for the duration of the COVID-19 declaration under Section 564(b)(1) of the Act, 21 U.S.C.section 360bbb-3(b)(1), unless the authorization is terminated  or  revoked sooner.       Influenza A by PCR NEGATIVE NEGATIVE Final   Influenza B by PCR NEGATIVE NEGATIVE Final    Comment: (NOTE) The Xpert Xpress SARS-CoV-2/FLU/RSV plus assay is intended as an aid in the diagnosis of influenza from Nasopharyngeal swab specimens and should not be used as a sole basis for treatment. Nasal washings and aspirates are unacceptable for Xpert Xpress SARS-CoV-2/FLU/RSV testing.  Fact Sheet for Patients: EntrepreneurPulse.com.au  Fact Sheet for Healthcare Providers: IncredibleEmployment.be  This test is not yet approved or cleared by the [place] FDA and has been authorized for detection and/or diagnosis of SARS-CoV-2 by FDA under an Emergency Use Authorization (EUA). This EUA will remain in effect (meaning this test can be used) for the duration of the COVID-19 declaration under Section 564(b)(1) of the Act, 21 U.S.C. section 360bbb-3(b)(1), unless the authorization is terminated or revoked.  Performed at [facility], [place] [address], [place], [place] [address]          Radiology Studies: NM PULMONARY VENT AND PERF (V/Q Scan)  Result Date: 05/13/2020 CLINICAL DATA:  PE suspected, high prob Lower extremity swelling, dyspnea and wheezing. EXAM: NUCLEAR MEDICINE PERFUSION LUNG SCAN TECHNIQUE: Perfusion images were obtained in multiple projections after intravenous injection of radiopharmaceutical. Ventilation scans intentionally deferred if perfusion scan and chest x-ray adequate for interpretation during COVID 19 epidemic. RADIOPHARMACEUTICALS:  4.25 mCi Tc-53m MAA IV COMPARISON:  Radiograph earlier today. FINDINGS: No peripheral wedge-shaped perfusion defects to suggest pulmonary embolus. Slight volume loss in the right hemithorax is due to right pleural effusion. Linear area of photopenia in the right lung likely corresponds to fluid in the fissure when correlated with radiograph. IMPRESSION: No  scintigraphic findings of pulmonary embolus. Electronically Signed   By: Keith Rake M.D.   On: 05/13/2020 17:15        Scheduled Meds: . acidophilus  1 capsule Oral q AM  . apixaban  2.5 mg Oral BID  . furosemide  40 mg Intravenous Daily  . guaiFENesin  600 mg Oral BID  . hydrALAZINE  50 mg Oral TID  . isosorbide dinitrate  30 mg Oral TID  . latanoprost  1 drop Both Eyes QHS  . loratadine  10 mg Oral Daily  . metoprolol succinate  25 mg Oral Daily  . NIFEdipine  60 mg Oral QHS  . pantoprazole  40 mg Oral BID AC  . simvastatin  40 mg Oral QHS  . sodium chloride flush  3 mL Intravenous Q12H  . tamsulosin  0.4 mg Oral QPC supper   Continuous Infusions: . sodium chloride       LOS: 1 day   Time spent= 35 mins    Kelwin Gibler Arsenio Loader, MD [facility]  If 7PM-7AM, please contact night-coverage  05/15/2020, 12:15 PM

## 2020-05-15 NOTE — Progress Notes (Signed)
   05/14/20 0852  Mobility  Activity Refused mobility (unspecified)

## 2020-05-15 NOTE — Evaluation (Signed)
Physical Therapy Evaluation Patient Details Name: Brian Wolf MRN: [id number] DOB: September 19, 1931 Today's Date: 05/15/2020   History of Present Illness  Pt is an 87 y.o. male admitted 05/13/20 with weakness, SOB and weight gain; workup for CHF exacerbation, R-side pleural effusion, bronchitis. PMH includes dementia, HTN, CVA (residual LUE weakness), pacemaker, CKD IV, anemia, OSA; of note, recent admission 04/05/20-04/18/20 with GIB secondary to duodenal ulcer.    Clinical Impression  Pt presents with an overall decrease in functional mobility secondary to above. PTA, pt ambulating with intermittent use of RW, but typically does not use DME; pt lives with wife who provides 24/7 supervision and assist with ADLs/iADLs as needed. Today, pt trialled gait training with and without RW, requiring intermittent min guard for balance. Pt's wife present and supportive. Pt would benefit from continued acute PT services to maximize functional mobility and independence prior to d/c with continued HHPT services (wife reports pt working with [facility] since recent admission 04/2020).     Follow Up Recommendations Home health PT;Supervision for mobility/OOB    Equipment Recommendations  None recommended by PT    Recommendations for Other Services       Precautions / Restrictions Precautions Precautions: Fall;Other (comment) Precaution Comments: Urinary incontinence/frequency (on Lasix) Restrictions Weight Bearing Restrictions: No      Mobility  Bed Mobility Overal bed mobility: Modified Independent                  Transfers Overall transfer level: Needs assistance Equipment used: None Transfers: Sit to/from Stand Sit to Stand: Min guard;Supervision         General transfer comment: Initial min guard for balance standing from EOB; able to stand from low toilet height with supervision for safety  Ambulation/Gait Ambulation/Gait assistance: Min guard Gait Distance (Feet): 180  Feet Assistive device: None;Rolling walker (2 wheeled) Gait Pattern/deviations: Step-through pattern;Decreased stride length;Trunk flexed Gait velocity: Decreased   General Gait Details: Slow, mostly steady gait in room without DME, min guard for balance, pt occasionally reaching to counter/furniture for UE support. Pt and wife request RW for hallway ambulation, intermittent min guard for balance, cues to maitnain closer proximity to RW, cues for activity pacing  Stairs            Wheelchair Mobility    Modified Rankin (Stroke Patients Only)       Balance Overall balance assessment: Needs assistance   Sitting balance-[name]: Good       Standing balance-[name]: Fair Standing balance comment: Can static stand and walk without UE support, balance not challenged                             Pertinent Vitals/Pain Pain Assessment: No/denies pain    Home Living Family/patient expects to be discharged to:: Private residence Living Arrangements: Spouse/significant other Available Help at Discharge: Family;Available 24 hours/day Type of Home: House Home Access: Ramped entrance     Home Layout: One level Home Equipment: Shower seat;Grab bars - tub/shower;Tub bench;Walker - 2 wheels;Cane - single point Additional Comments: Wife basically provides 24/7 supervision/support for safety    Prior Function Level of Independence: Needs assistance   Gait / Transfers Assistance Needed: Ambulatory without DME, wife provides supervision majority of time; wife drives  ADL's / Homemaking Assistance Needed: Wife assists with getting into/out of tub, keeps chair next to tub so pt can sit on to dry; wife provides supervision for toileting  Comments: Was working with  HHPT since recent admission 04/2020     Hand Dominance        Extremity/Trunk Assessment   Upper Extremity Assessment Upper Extremity Assessment: Overall WFL for tasks assessed    Lower Extremity  Assessment Lower Extremity Assessment: Generalized weakness       Communication      Cognition Arousal/Alertness: Awake/alert Behavior During Therapy: WFL for tasks assessed/performed Overall Cognitive Status: History of cognitive impairments - at baseline                                 General Comments: Per chart, h/o dementia. Pt HOH with speech difficult to understand at times. Answering majority of questions and following simple commands appropriately; at times requiring repetition, likely related to [facility]. Suspect near baseline cognition. Wife reports, "He's stubborn"      General Comments General comments (skin integrity, edema, etc.): Wife present and supportive; agreeable for pt to continue with HHPT services. Wife states, "He's only going to do what he wants to do"    Exercises     Assessment/Plan    PT Assessment Patient needs continued PT services  PT Problem List Decreased strength;Decreased activity tolerance;Decreased balance;Decreased mobility;Decreased cognition;Cardiopulmonary status limiting activity       PT Treatment Interventions DME instruction;Gait training;Stair training;Functional mobility training;Therapeutic activities;Therapeutic exercise;Balance training;Patient/family education    PT Goals (Current goals can be found in the Care Plan section)  Acute Rehab PT Goals Patient Stated Goal: Return home PT Goal Formulation: With patient/family Time For Goal Achievement: 05/29/20 Potential to Achieve Goals: Good    Frequency Min 3X/week   Barriers to discharge        Co-evaluation               AM-PAC PT "6 Clicks" Mobility  Outcome Measure Help needed turning from your back to your side while in a flat bed without using bedrails?: None Help needed moving from lying on your back to sitting on the side of a flat bed without using bedrails?: None Help needed moving to and from a bed to a chair (including a wheelchair)?: A  Little Help needed standing up from a chair using your arms (e.g., wheelchair or bedside chair)?: A Little Help needed to walk in hospital room?: A Little Help needed climbing 3-5 steps with a railing? : A Little 6 Click Score: 20    End of Session Equipment Utilized During Treatment: Gait belt Activity Tolerance: Patient tolerated treatment well Patient left: in chair;with call bell/phone within reach;with chair alarm set;with family/visitor present Nurse Communication: Mobility status PT Visit Diagnosis: Other abnormalities of gait and mobility (R26.89);Muscle weakness (generalized) (M62.81)    Time: 7078-6754 PT Time Calculation (min) (ACUTE ONLY): 24 min   Charges:   PT Evaluation $PT Eval Moderate Complexity: 1 Mod PT Treatments $Gait Training: 8-22 mins       Mabeline Caras, PT, DPT Acute Rehabilitation Services  Pager [phone] Office [place] 05/15/2020, 11:36 AM

## 2020-05-15 NOTE — Evaluation (Signed)
Occupational Therapy Evaluation Patient Details Name: Brian Wolf MRN: [id number] DOB: February 05, 1931 Today's Date: 05/15/2020    History of Present Illness Pt is an 87 y.o. male admitted 05/13/20 with weakness, SOB and weight gain; workup for CHF exacerbation, R-side pleural effusion, bronchitis. PMH includes dementia, HTN, CVA (residual LUE weakness), pacemaker, CKD IV, anemia, OSA; of note, recent admission 04/05/20-04/18/20 with GIB secondary to duodenal ulcer.   Clinical Impression   Patient admitted for the diagnosis above.  Barriers are listed below.  Essentially he is at or near his baseline for toileting and ADL status.  His wife provides adequate supervision and assist as needed, and no acute needs exist for OT.  The patient was seeing [facility] PT prior to his admission, and could continue if recommended by PT.      Follow Up Recommendations  No OT follow up    Equipment Recommendations  None recommended by OT    Recommendations for Other Services       Precautions / Restrictions Precautions Precautions: Fall Precaution Comments: Urinary incontinence/frequency (on Lasix) Restrictions Weight Bearing Restrictions: No      Mobility Bed Mobility Overal bed mobility: Modified Independent                  Transfers Overall transfer level: Needs assistance Equipment used: Rolling walker (2 wheeled) Transfers: Sit to/from [facility] Sit to Stand: Supervision Stand pivot transfers: Supervision       General transfer comment: Initial min guard for balance standing from EOB; able to stand from low toilet height with supervision for safety    Balance Overall balance assessment: Needs assistance Sitting-balance support: Feet supported Sitting balance-[name]: Normal       Standing balance-[name]: Fair Standing balance comment: Can static stand and walk without UE support, balance not challenged                           ADL  either performed or assessed with clinical judgement   ADL Overall ADL's : At baseline                                             Vision Patient Visual Report: No change from baseline                  Pertinent Vitals/Pain Pain Assessment: No/denies pain     Hand Dominance Right   Extremity/Trunk Assessment Upper Extremity Assessment Upper Extremity Assessment: Overall WFL for tasks assessed   Lower Extremity Assessment Lower Extremity Assessment: Defer to PT evaluation   Cervical / Trunk Assessment Cervical / Trunk Assessment: Kyphotic   Communication Communication Communication: No difficulties   Cognition Arousal/Alertness: Awake/alert Behavior During Therapy: WFL for tasks assessed/performed Overall Cognitive Status: History of cognitive impairments - at baseline                                 General Comments: Per chart, h/o dementia. Pt HOH with speech difficult to understand at times. Answering majority of questions and following simple commands appropriately; at times requiring repetition, likely related to [facility]. Suspect near baseline cognition. Wife reports, "He's stubborn"   General Comments  Wife present and supportive; agreeable for pt to continue with HHPT services. Wife states, "He's only going to  do what he wants to do"    Exercises     Shoulder Instructions      Home Living Family/patient expects to be discharged to:: Private residence Living Arrangements: Spouse/significant other Available Help at Discharge: Family;Available 24 hours/day Type of Home: House Home Access: Ramped entrance     Home Layout: One level     Bathroom Shower/Tub: Teacher, early years/pre: Handicapped height Bathroom Accessibility: Yes How Accessible: Accessible via walker Home Equipment: Shower seat;Grab bars - tub/shower;Tub bench;Walker - 2 wheels;Cane - single point   Additional Comments: Wife basically provides 24/7  supervision/support for safety      Prior Functioning/Environment Level of Independence: Needs assistance  Gait / Transfers Assistance Needed: Ambulatory without DME, wife provides supervision majority of time; wife drives ADL's / Homemaking Assistance Needed: Wife assists with getting into/out of tub, keeps chair next to tub so pt can sit on to dry; wife provides supervision for toileting   Comments: Was working with [facility] since recent admission 04/2020        OT Problem List: Impaired balance (sitting and/or standing)      OT Treatment/Interventions:      OT Goals(Current goals can be found in the care plan section) Acute Rehab OT Goals Patient Stated Goal: Return home OT Goal Formulation: With patient Time For Goal Achievement: 05/15/20 Potential to Achieve Goals: Good ADL Goals Pt Will Perform Grooming: with set-up;sitting;standing Pt Will Perform Lower Body Bathing: with modified independence;sit to/from stand Pt Will Perform Lower Body Dressing: with modified independence;sit to/from stand Pt Will Transfer to Toilet: with modified independence;regular height toilet;ambulating Pt Will Perform Toileting - Clothing Manipulation and hygiene: with modified independence;sit to/from stand  OT Frequency:     Barriers to D/C:  none noted          Co-evaluation              AM-PAC OT "6 Clicks" Daily Activity     Outcome Measure Help from another person eating meals?: None Help from another person taking care of personal grooming?: None Help from another person toileting, which includes using toliet, bedpan, or urinal?: None Help from another person bathing (including washing, rinsing, drying)?: A Little Help from another person to put on and taking off regular upper body clothing?: A Little Help from another person to put on and taking off regular lower body clothing?: A Little 6 Click Score: 21   End of Session Equipment Utilized During Treatment: Rolling  walker Nurse Communication: Other (comment) (urine outpot of 150)  Activity Tolerance: Patient tolerated treatment well Patient left: in chair;with call bell/phone within reach;with chair alarm set;with family/visitor present  OT Visit Diagnosis: Unsteadiness on feet (R26.81)                Time: 1432-1500 OT Time Calculation (min): 28 min Charges:  OT General Charges $OT Visit: 1 Visit OT Evaluation $OT Eval Moderate Complexity: 1 Mod OT Treatments $Self Care/Home Management : 8-22 mins  05/15/2020  Rich, OTR/L  Acute Rehabilitation Services  Office:  [phone]   Metta Clines 05/15/2020, 3:10 PM

## 2020-05-15 NOTE — Progress Notes (Addendum)
ANTICOAGULATION CONSULT NOTE - Initial Consult  Pharmacy Consult for apixaban Indication: atrial fibrillation  Allergies  Allergen Reactions  . Lisinopril Cough  . Nsaids Other (See Comments)    GI bleed(s)     Patient Measurements: Height: 5\' 4"  (162.6 cm) Weight: 72.3 kg (159 lb 6.3 oz) IBW/kg (Calculated) : 59.2  Vital Signs: Temp: 97.7 F (36.5 C) (04/14 0903) Temp Source: Oral (04/14 0903) BP: 127/55 (04/14 0903) Pulse Rate: 64 (04/14 0903)  Labs: Recent Labs    05/13/20 1056 05/13/20 1243 05/14/20 0550 05/15/20 0355  HGB 10.0*  --  10.5* 9.4*  HCT 30.9*  --  32.8* 28.8*  PLT 286  --  312 305  CREATININE 2.63*  --  2.58* 2.94*  TROPONINIHS 31* 31*  --   --     Estimated Creatinine Clearance: 16.4 mL/min (A) (by C-G formula based on SCr of 2.94 mg/dL (H)).   Medical History: Past Medical History:  Diagnosis Date  . CVA (cerebral infarction) 12/2013  . Dementia ([place])   . Gastric ulcer    prior to 2000  . Hypertension   . Sleep difficulties    Assessment: 87 year old male with history of HTN, CVA and now ordered apixaban for afib. On chart review it is noted he likely had underlying afib last year. Given his age and renal function he qualifies for the lower dose and do not expect this to change. Also noted to have recurrent GIB so will need to watch for bleeding closely.   Benefits check show copay of $47. Will provide education prior to discharge.   Goal of Therapy:  Monitor platelets by anticoagulation protocol: Yes   Plan:  Apixaban 2.5 bid  Erin Hearing PharmD., BCPS Clinical Pharmacist 05/15/2020 10:35 AM

## 2020-05-15 NOTE — TOC Benefit Eligibility Note (Signed)
Patient Advocate Encounter  Insurance verification completed.    The patient is currently admitted and upon discharge could be taking Eliquis 5 mg.  The current 30 day co-pay is, $47.00.   The patient is insured through [employer] Medicare Part D     Sherria Riemann, CPhT Pharmacy Patient Advocate Specialist [facility] Team Direct Number: [phone]  Fax: [phone]        

## 2020-05-15 NOTE — Progress Notes (Signed)
Heart Failure Navigator Progress Note  Assessed for Heart & Vascular TOC clinic readiness.  Unfortunately at this time the patient does not meet criteria due to CKD IV, SCr 2.94 and dementia.   Navigator available for reassessment of patient.   Pricilla Holm, RN, BSN Heart Failure Nurse Navigator [phone]

## 2020-05-16 ENCOUNTER — Other Ambulatory Visit (HOSPITAL_COMMUNITY): Payer: Self-pay

## 2020-05-16 DIAGNOSIS — N184 Chronic kidney disease, stage 4 (severe): Secondary | ICD-10-CM

## 2020-05-16 DIAGNOSIS — Z515 Encounter for palliative care: Secondary | ICD-10-CM

## 2020-05-16 DIAGNOSIS — I4891 Unspecified atrial fibrillation: Secondary | ICD-10-CM

## 2020-05-16 DIAGNOSIS — I509 Heart failure, unspecified: Secondary | ICD-10-CM

## 2020-05-16 DIAGNOSIS — Z66 Do not resuscitate: Secondary | ICD-10-CM

## 2020-05-16 DIAGNOSIS — J90 Pleural effusion, not elsewhere classified: Secondary | ICD-10-CM

## 2020-05-16 DIAGNOSIS — Z7189 Other specified counseling: Secondary | ICD-10-CM

## 2020-05-16 LAB — BASIC METABOLIC PANEL
Anion gap: 7 (ref 5–15)
BUN: 72 mg/dL — ABNORMAL HIGH (ref 8–23)
CO2: 21 mmol/L — ABNORMAL LOW (ref 22–32)
Calcium: 8.1 mg/dL — ABNORMAL LOW (ref 8.9–10.3)
Chloride: 106 mmol/L (ref 98–111)
Creatinine, Ser: 2.9 mg/dL — ABNORMAL HIGH (ref 0.61–1.24)
GFR, Estimated: 20 mL/min — ABNORMAL LOW (ref >60)
Glucose, Bld: 96 mg/dL (ref 70–99)
Potassium: 4.4 mmol/L (ref 3.5–5.1)
Sodium: 134 mmol/L — ABNORMAL LOW (ref 135–145)

## 2020-05-16 LAB — CBC
HCT: 29.4 % — ABNORMAL LOW (ref 39.0–52.0)
Hemoglobin: 9.6 g/dL — ABNORMAL LOW (ref 13.0–17.0)
MCH: 30.4 pg (ref 26.0–34.0)
MCHC: 32.7 g/dL (ref 30.0–36.0)
MCV: 93 fL (ref 80.0–100.0)
Platelets: 312 10*3/uL (ref 150–400)
RBC: 3.16 MIL/uL — ABNORMAL LOW (ref 4.22–5.81)
RDW: 16.5 % — ABNORMAL HIGH (ref 11.5–15.5)
WBC: 10.1 10*3/uL (ref 4.0–10.5)
nRBC: 0 % (ref 0.0–0.2)

## 2020-05-16 LAB — MAGNESIUM: Magnesium: 1.6 mg/dL — ABNORMAL LOW (ref 1.7–2.4)

## 2020-05-16 MED ORDER — FUROSEMIDE 40 MG PO TABS
40.0000 mg | ORAL_TABLET | Freq: Two times a day (BID) | ORAL | 0 refills | Status: DC
  Filled 2020-05-16: qty 60, 30d supply, fill #0

## 2020-05-16 MED ORDER — MAGNESIUM OXIDE 400 (241.3 MG) MG PO TABS
800.0000 mg | ORAL_TABLET | ORAL | Status: DC
  Administered 2020-05-16: 800 mg via ORAL
  Filled 2020-05-16: qty 2

## 2020-05-16 MED ORDER — POTASSIUM CHLORIDE CRYS ER 20 MEQ PO TBCR: 20.0000 meq | EXTENDED_RELEASE_TABLET | Freq: Two times a day (BID) | ORAL | 0 refills | Status: AC

## 2020-05-16 MED ORDER — FUROSEMIDE 10 MG/ML IJ SOLN
80.0000 mg | Freq: Two times a day (BID) | INTRAMUSCULAR | Status: DC
  Administered 2020-05-16: 80 mg via INTRAVENOUS
  Filled 2020-05-16: qty 8

## 2020-05-16 MED ORDER — POTASSIUM CHLORIDE CRYS ER 20 MEQ PO TBCR
20.0000 meq | EXTENDED_RELEASE_TABLET | Freq: Two times a day (BID) | ORAL | 0 refills | Status: DC
  Filled 2020-05-16: qty 60, 30d supply, fill #0

## 2020-05-16 NOTE — Discharge Summary (Signed)
Physician Discharge Summary  Brian Wolf DGU:[id number] DOB: 10-Sep-1931 DOA: 05/13/2020  PCP: [name], DO  Admit date: 05/13/2020 Discharge date: 05/16/2020  Admitted From: Home Disposition: Home with hospice  Recommendations for Outpatient Follow-up:  1. Follow up with PCP in 1-2 weeks 2. Please obtain BMP/CBC in one week your next doctors visit.  3. Lasix 40 mg twice daily, potassium supplements 4. Follow outpatient cardiology in 1-2 weeks, to be arranged by their service.  Discharge Condition: Stable CODE STATUS: DNR Diet recommendation: Heart healthy  Brief/Interim Summary: 87 year old with history of HTN, CVA, sinus node dysfunction status post pacemaker, CKD stage IV, GI bleed from duodenal ulcer, anemia admitted for shortness of breath and weight gain.  Recently hospitalized 4 weeks ago for GI bleed related to duodenal ulcer requiring blood transfusion and possible concerns of aspiration pneumonia Proteus UTI upon admission VQ scan performed which is negative for PE.  Nephrology and cardiology team has been consulted.  Due to patient's ongoing and recurrent issues, palliative care team has also been consulted.  A. fib noted on telemetry monitoring but not on anticoagulation due to history of GI bleeding. Due to advanced heart failure and comorbidities, palliative care team was consulted.  After discussion with the patient's wife, patient was to be discharged home with hospice.  I met with the patient and wife at bedside, all the questions were answered.  Stable for discharge.   Assessment & Plan:   Principal Problem:   CHF (congestive heart failure) (HCC) Active Problems:   Hypertension, essential   Obstructive sleep apnea syndrome   Pleural effusion   Benign prostatic hyperplasia   Dyslipidemia   Bronchitis with acute wheezing   Leukocytosis   Elevated troponin   CKD (chronic kidney disease), stage IV ([place])   DNR (do not resuscitate)   New onset atrial  fibrillation ([place])  Acute congestive heart failure with reduced ejection fraction EF 35%, grade 1 DD, class II Right-sided pleural effusion -  Transition patient to oral Lasix along with potassium supplements.  Discussed with cardiology team -Strict input and output.  Daily weight -Cardiology following -BNP 677 -Continue hydralazine 50 mg 3 times daily, isosorbide dinitrate 30 mg 3 times daily, Toprol-XL 25 mg daily  Persistent atrial fibrillation, rate controlled -Continue Toprol.  Not home anticoagulation due to history of GI bleeding  Acute bronchitis with mild wheezing abnormal breath sounds, improved -Currently on bronchodilators.   -Mucinex -Procalcitonin-0.25  Essential hypertension -Continue hydralazine, isosorbide mononitrate, metoprolol and nifedipine  CKD stage IV -Renal function around baseline.  Creatinine 2.5.  Normocytic anemia -Hemoglobin around baseline 9.0  Hyperlipidemia -Zocor 40 mg daily  BPH -Flomax  Obstructive sleep apnea -CPAP at bedtime  GERD with duodenal ulcer and recent GI bleed -PPI BID  Patient has multiple comorbid condition and poor dietary compliance with recurrent admissions.    Patient and wife met with the palliative care team, will be transition to home with hospice   Body mass index is 27.97 kg/m.         Discharge Diagnoses:  Principal Problem:   CHF (congestive heart failure) (HCC) Active Problems:   Hypertension, essential   Obstructive sleep apnea syndrome   Pleural effusion   Benign prostatic hyperplasia   Dyslipidemia   Bronchitis with acute wheezing   Leukocytosis   Elevated troponin   CKD (chronic kidney disease), stage IV ([place])   DNR (do not resuscitate)   New onset atrial fibrillation [facility])      Consultations:  Cardiology  Palliative care  Nephrology  Subjective: Feels okay no complaints. Met with patient and family at bedside.  Agreeable to be discharged home with hospice.   Symptomatically feeling much better.  Discharge Exam: Vitals:   05/16/20 0552 05/16/20 1059  BP: (!) 143/61 (!) 141/56  Pulse: 69 63  Resp: 18 17  Temp: 97.7 F (36.5 C) 98.2 F (36.8 C)  SpO2: 96% 97%   Vitals:   05/16/20 0023 05/16/20 0500 05/16/20 0552 05/16/20 1059  BP: (!) 130/54  (!) 143/61 (!) 141/56  Pulse: 66  69 63  Resp: _0 Temp: 97.7 F (36.5 C)  97.7 F (36.5 C) 98.2 F (36.8 C)  TempSrc: Oral  Oral   SpO2: 96%  96% 97%  Weight: 73.9 kg 73.9 kg    Height:        General: Pt is alert, awake, not in acute distress, chronically ill-appearing Cardiovascular: RRR, S1/S2 +, no rubs, no gallops Respiratory: CTA bilaterally, no wheezing, no rhonchi Abdominal: Soft, NT, ND, bowel sounds + Extremities: no edema, no cyanosis  Discharge Instructions  Discharge Instructions    (HEART FAILURE PATIENTS) Call MD:  Anytime you have any of the following symptoms: 1) 3 pound weight gain in 24 hours or 5 pounds in 1 week 2) shortness of breath, with or without a dry hacking cough 3) swelling in the hands, feet or stomach 4) if you have to sleep on extra pillows at night in order to breathe.   Complete by: As directed    Diet - low sodium heart healthy   Complete by: As directed    Increase activity slowly   Complete by: As directed      Allergies as of 05/16/2020      Reactions   Lisinopril Cough   Nsaids Other (See Comments)   GI bleed(s)      Medication List    TAKE these medications   fexofenadine 180 MG tablet Commonly known as: ALLEGRA Take 180 mg by mouth in the morning.   furosemide 40 MG tablet Commonly known as: Lasix Take 1 tablet (40 mg total) by mouth 2 (two) times daily. What changed:   medication strength  how much to take  when to take this   hydrALAZINE 50 MG tablet Commonly known as: APRESOLINE Take 1 tablet (50 mg total) by mouth 3 (three) times daily.   isosorbide dinitrate 30 MG tablet Commonly known as: ISORDIL Take 1  tablet (30 mg total) by mouth 3 (three) times daily.   lactulose 10 GM/15ML solution Commonly known as: CHRONULAC Take 15 mLs (10 g total) by mouth daily as needed for mild constipation.   metoprolol succinate 25 MG 24 hr tablet Commonly known as: TOPROL-XL Take 1 tablet (25 mg total) by mouth daily.   NIFEdipine 60 MG 24 hr tablet Commonly known as: ADALAT CC Take 60 mg by mouth at bedtime.   pantoprazole 40 MG tablet Commonly known as: PROTONIX Take 1 tablet (40 mg total) by mouth 2 (two) times daily before a meal.   potassium chloride SA 20 MEQ tablet Commonly known as: KLOR-CON Take 1 tablet (20 mEq total) by mouth 2 (two) times daily. What changed:   when to take this  reasons to take this   PROBIOTIC DAILY PO Take 1 capsule by mouth in the morning.   simvastatin 40 MG tablet Commonly known as: ZOCOR Take 40 mg by mouth at bedtime.   tamsulosin 0.4 MG Caps capsule Commonly known  as: FLOMAX Take 0.4 mg by mouth daily after supper.   Travoprost (BAK Free) 0.004 % Soln ophthalmic solution Commonly known as: TRAVATAN Place 1 drop into both eyes every evening.       Follow-up Information    [name], DO.   Specialty: Family Medicine Why: Please follow up in a week Contact information: [address] [place] [address] [phone]              Allergies  Allergen Reactions  . Lisinopril Cough  . Nsaids Other (See Comments)    GI bleed(s)     You were cared for by a hospitalist during your hospital stay. If you have any questions about your discharge medications or the care you received while you were in the hospital after you are discharged, you can call the unit and asked to speak with the hospitalist on call if the hospitalist that took care of you is not available. Once you are discharged, your primary care physician will handle any further medical issues. Please note that no refills for any discharge medications will be  authorized once you are discharged, as it is imperative that you return to your primary care physician (or establish a relationship with a primary care physician if you do not have one) for your aftercare needs so that they can reassess your need for medications and monitor your lab values.   Procedures/Studies: DG Chest 2 View  Result Date: 05/13/2020 CLINICAL DATA:  Shortness of breath. EXAM: CHEST - 2 VIEW COMPARISON:  Jul 01, 2020. FINDINGS: Stable cardiomegaly. No pneumothorax is noted. Stable mild-to-moderate right-sided pleural effusion is noted with associated right basilar atelectasis or infiltrate. Minimal left basilar subsegmental atelectasis is noted with small left pleural effusion. Bony thorax is unremarkable. IMPRESSION: Stable moderate size right pleural effusion is noted with associated atelectasis or infiltrate. Stable left basilar opacities as well. Electronically Signed   By: Marijo Conception M.D.   On: 05/13/2020 11:55   NM PULMONARY VENT AND PERF (V/Q Scan)  Result Date: 05/13/2020 CLINICAL DATA:  PE suspected, high prob Lower extremity swelling, dyspnea and wheezing. EXAM: NUCLEAR MEDICINE PERFUSION LUNG SCAN TECHNIQUE: Perfusion images were obtained in multiple projections after intravenous injection of radiopharmaceutical. Ventilation scans intentionally deferred if perfusion scan and chest x-ray adequate for interpretation during COVID 19 epidemic. RADIOPHARMACEUTICALS:  4.25 mCi Tc-90mMAA IV COMPARISON:  Radiograph earlier today. FINDINGS: No peripheral wedge-shaped perfusion defects to suggest pulmonary embolus. Slight volume loss in the right hemithorax is due to right pleural effusion. Linear area of photopenia in the right lung likely corresponds to fluid in the fissure when correlated with radiograph. IMPRESSION: No scintigraphic findings of pulmonary embolus. Electronically Signed   By: MKeith RakeM.D.   On: 05/13/2020 17:15   VAS [place]OWER EXTREMITY VENOUS (DVT)  (ONLY [facility] & [facility])  Result Date: 05/13/2020  Lower Venous DVT Study Indications: Edema.  Comparison Study: no prior Performing Technologist: MAbram SanderRVS  Examination Guidelines: A complete evaluation includes B-mode imaging, spectral Doppler, color Doppler, and power Doppler as needed of all accessible portions of each vessel. Bilateral testing is considered an integral part of a complete examination. Limited examinations for reoccurring indications may be performed as noted. The reflux portion of the exam is performed with the patient in reverse Trendelenburg.  +---------+---------------+---------+-----------+----------+--------------+ RIGHT    CompressibilityPhasicitySpontaneityPropertiesThrombus Aging +---------+---------------+---------+-----------+----------+--------------+ CFV      Full           Yes  Yes                                 +---------+---------------+---------+-----------+----------+--------------+ SFJ      Full                                                        +---------+---------------+---------+-----------+----------+--------------+ FV Prox  Full                                                        +---------+---------------+---------+-----------+----------+--------------+ FV Mid   Full                                                        +---------+---------------+---------+-----------+----------+--------------+ FV DistalFull                                                        +---------+---------------+---------+-----------+----------+--------------+ PFV      Full                                                        +---------+---------------+---------+-----------+----------+--------------+ POP      Full           Yes      Yes                                 +---------+---------------+---------+-----------+----------+--------------+ PTV      Full                                                         +---------+---------------+---------+-----------+----------+--------------+ PERO     Full                                                        +---------+---------------+---------+-----------+----------+--------------+   +----+---------------+---------+-----------+----------+-------------------+ LEFTCompressibilityPhasicitySpontaneityPropertiesThrombus Aging      +----+---------------+---------+-----------+----------+-------------------+ CFV                                              Not well visualized +----+---------------+---------+-----------+----------+-------------------+     Summary: RIGHT: - There is no evidence of deep vein thrombosis in the  lower extremity.  - No cystic structure found in the popliteal fossa.   *See table(s) above for measurements and observations. Electronically signed by Deitra Mayo MD on 05/13/2020 at 1:50:03 PM.    Final       The results of significant diagnostics from this hospitalization (including imaging, microbiology, ancillary and laboratory) are listed below for reference.     Microbiology: Recent Results (from the past 240 hour(s))  Resp Panel by RT-PCR (Flu A&B, Covid) Nasopharyngeal Swab     Status: None   Collection Time: 05/13/20 10:56 AM   Specimen: Nasopharyngeal Swab; Nasopharyngeal(NP) swabs in vial transport medium  Result Value Ref Range Status   SARS Coronavirus 2 by RT PCR NEGATIVE NEGATIVE Final    Comment: (NOTE) SARS-CoV-2 target nucleic acids are NOT DETECTED.  The SARS-CoV-2 RNA is generally detectable in upper respiratory specimens during the acute phase of infection. The lowest concentration of SARS-CoV-2 viral copies this assay can detect is 138 copies/mL. A negative result does not preclude SARS-Cov-2 infection and should not be used as the sole basis for treatment or other patient management decisions. A negative result may occur with  improper specimen collection/handling, submission of specimen  other than nasopharyngeal swab, presence of viral mutation(s) within the areas targeted by this assay, and inadequate number of viral copies(<138 copies/mL). A negative result must be combined with clinical observations, patient history, and epidemiological information. The expected result is Negative.  Fact Sheet for Patients:  EntrepreneurPulse.com.au  Fact Sheet for Healthcare Providers:  IncredibleEmployment.be  This test is no t yet approved or cleared by the [place] FDA and  has been authorized for detection and/or diagnosis of SARS-CoV-2 by FDA under an Emergency Use Authorization (EUA). This EUA will remain  in effect (meaning this test can be used) for the duration of the COVID-19 declaration under Section 564(b)(1) of the Act, 21 U.S.C.section 360bbb-3(b)(1), unless the authorization is terminated  or revoked sooner.       Influenza A by PCR NEGATIVE NEGATIVE Final   Influenza B by PCR NEGATIVE NEGATIVE Final    Comment: (NOTE) The Xpert Xpress SARS-CoV-2/FLU/RSV plus assay is intended as an aid in the diagnosis of influenza from Nasopharyngeal swab specimens and should not be used as a sole basis for treatment. Nasal washings and aspirates are unacceptable for Xpert Xpress SARS-CoV-2/FLU/RSV testing.  Fact Sheet for Patients: EntrepreneurPulse.com.au  Fact Sheet for Healthcare Providers: IncredibleEmployment.be  This test is not yet approved or cleared by the [place] FDA and has been authorized for detection and/or diagnosis of SARS-CoV-2 by FDA under an Emergency Use Authorization (EUA). This EUA will remain in effect (meaning this test can be used) for the duration of the COVID-19 declaration under Section 564(b)(1) of the Act, 21 U.S.C. section 360bbb-3(b)(1), unless the authorization is terminated or revoked.  Performed at [facility], [place] [address], [place],  [place] [address]      Labs: BNP (last 3 results) Recent Labs    04/09/20 0251 04/10/20 0348 05/13/20 1056  BNP 1,085.0* 775.5* 654.6*   Basic Metabolic Panel: Recent Labs  Lab 05/13/20 1056 05/14/20 0550 05/15/20 0355 05/16/20 0226  NA 137 137 137 134*  K 4.3 4.5 4.4 4.4  CL 108 107 107 106  CO2 20* 20* 21* 21*  GLUCOSE 99 149* 104* 96  BUN 57* 60* 69* 72*  CREATININE 2.63* 2.58* 2.94* 2.90*  CALCIUM 8.2* 8.5* 8.3* 8.1*  MG  --   --  1.7 1.6*  Liver Function Tests: Recent Labs  Lab 05/13/20 1056  AST 37  ALT 22  ALKPHOS 122  BILITOT 0.6  PROT 6.1*  ALBUMIN 2.5*   No results for input(s): LIPASE, AMYLASE in the last 168 hours. No results for input(s): AMMONIA in the last 168 hours. CBC: Recent Labs  Lab 05/13/20 1056 05/14/20 0550 05/15/20 0355 05/16/20 0226  WBC 10.9* 4.7 13.3* 10.1  NEUTROABS 5.1  --   --   --   HGB 10.0* 10.5* 9.4* 9.6*  HCT 30.9* 32.8* 28.8* 29.4*  MCV 95.1 95.9 93.8 93.0  PLT 286 312 305 312   Cardiac Enzymes: No results for input(s): CKTOTAL, CKMB, CKMBINDEX, TROPONINI in the last 168 hours. BNP: Invalid input(s): POCBNP CBG: No results for input(s): GLUCAP in the last 168 hours. D-Dimer No results for input(s): DDIMER in the last 72 hours. Hgb A1c No results for input(s): HGBA1C in the last 72 hours. Lipid Profile No results for input(s): CHOL, HDL, LDLCALC, TRIG, CHOLHDL, LDLDIRECT in the last 72 hours. Thyroid function studies No results for input(s): TSH, T4TOTAL, T3FREE, THYROIDAB in the last 72 hours.  Invalid input(s): FREET3 Anemia work up No results for input(s): VITAMINB12, FOLATE, FERRITIN, TIBC, IRON, RETICCTPCT in the last 72 hours. Urinalysis    Component Value Date/Time   COLORURINE STRAW (A) 05/13/2020 1658   APPEARANCEUR CLEAR 05/13/2020 1658   LABSPEC 1.006 05/13/2020 1658   PHURINE 5.0 05/13/2020 1658   GLUCOSEU NEGATIVE 05/13/2020 1658   HGBUR NEGATIVE 05/13/2020 1658   BILIRUBINUR NEGATIVE  05/13/2020 1658   KETONESUR NEGATIVE 05/13/2020 1658   PROTEINUR 30 (A) 05/13/2020 1658   UROBILINOGEN 0.2 12/16/2013 0929   NITRITE NEGATIVE 05/13/2020 1658   LEUKOCYTESUR NEGATIVE 05/13/2020 1658   Sepsis Labs Invalid input(s): PROCALCITONIN,  WBC,  LACTICIDVEN Microbiology Recent Results (from the past 240 hour(s))  Resp Panel by RT-PCR (Flu A&B, Covid) Nasopharyngeal Swab     Status: None   Collection Time: 05/13/20 10:56 AM   Specimen: Nasopharyngeal Swab; Nasopharyngeal(NP) swabs in vial transport medium  Result Value Ref Range Status   SARS Coronavirus 2 by RT PCR NEGATIVE NEGATIVE Final    Comment: (NOTE) SARS-CoV-2 target nucleic acids are NOT DETECTED.  The SARS-CoV-2 RNA is generally detectable in upper respiratory specimens during the acute phase of infection. The lowest concentration of SARS-CoV-2 viral copies this assay can detect is 138 copies/mL. A negative result does not preclude SARS-Cov-2 infection and should not be used as the sole basis for treatment or other patient management decisions. A negative result may occur with  improper specimen collection/handling, submission of specimen other than nasopharyngeal swab, presence of viral mutation(s) within the areas targeted by this assay, and inadequate number of viral copies(<138 copies/mL). A negative result must be combined with clinical observations, patient history, and epidemiological information. The expected result is Negative.  Fact Sheet for Patients:  EntrepreneurPulse.com.au  Fact Sheet for Healthcare Providers:  IncredibleEmployment.be  This test is no t yet approved or cleared by the [place] FDA and  has been authorized for detection and/or diagnosis of SARS-CoV-2 by FDA under an Emergency Use Authorization (EUA). This EUA will remain  in effect (meaning this test can be used) for the duration of the COVID-19 declaration under Section 564(b)(1) of the  Act, 21 U.S.C.section 360bbb-3(b)(1), unless the authorization is terminated  or revoked sooner.       Influenza A by PCR NEGATIVE NEGATIVE Final   Influenza B by PCR NEGATIVE NEGATIVE Final  Comment: (NOTE) The Xpert Xpress SARS-CoV-2/FLU/RSV plus assay is intended as an aid in the diagnosis of influenza from Nasopharyngeal swab specimens and should not be used as a sole basis for treatment. Nasal washings and aspirates are unacceptable for Xpert Xpress SARS-CoV-2/FLU/RSV testing.  Fact Sheet for Patients: EntrepreneurPulse.com.au  Fact Sheet for Healthcare Providers: IncredibleEmployment.be  This test is not yet approved or cleared by the [place] FDA and has been authorized for detection and/or diagnosis of SARS-CoV-2 by FDA under an Emergency Use Authorization (EUA). This EUA will remain in effect (meaning this test can be used) for the duration of the COVID-19 declaration under Section 564(b)(1) of the Act, 21 U.S.C. section 360bbb-3(b)(1), unless the authorization is terminated or revoked.  Performed at [facility], [place] [address] [place], [place] [address]      Time coordinating discharge:  I have spent 35 minutes face to face with the patient and on the ward discussing the patients care, assessment, plan and disposition with other care givers. >50% of the time was devoted counseling the patient about the risks and benefits of treatment/Discharge disposition and coordinating care.   SIGNED:   Damita Lack, MD  [facility] 05/16/2020, 12:01 PM   If 7PM-7AM, please contact night-coverage

## 2020-05-16 NOTE — Plan of Care (Signed)
  Problem: Education: Goal: Knowledge of General Education information will improve Description: Including pain rating scale, medication(s)/side effects and non-pharmacologic comfort measures 05/16/2020 1331 by Eulis Manly, RN Outcome: Progressing 05/16/2020 1330 by Silvestre Mesi D, RN Outcome: Progressing   Problem: Clinical Measurements: Goal: Will remain free from infection 05/16/2020 1331 by Eulis Manly, RN Outcome: Progressing 05/16/2020 1331 by Eulis Manly, RN Outcome: Progressing 05/16/2020 1330 by Silvestre Mesi D, RN Outcome: Progressing   Problem: Clinical Measurements: Goal: Cardiovascular complication will be avoided 05/16/2020 1331 by Silvestre Mesi D, RN Outcome: Progressing 05/16/2020 1330 by Silvestre Mesi D, RN Outcome: Progressing   Problem: Clinical Measurements: Goal: Respiratory complications will improve 05/16/2020 1331 by Eulis Manly, RN Outcome: Progressing 05/16/2020 1330 by Silvestre Mesi D, RN Outcome: Progressing   Problem: Coping: Goal: Level of anxiety will decrease 05/16/2020 1331 by Silvestre Mesi D, RN Outcome: Progressing 05/16/2020 1330 by Silvestre Mesi D, RN Outcome: Progressing   Problem: Safety: Goal: Ability to remain free from injury will improve Outcome: Progressing

## 2020-05-16 NOTE — Progress Notes (Signed)
[facility] ([facility])  Hospital Liaison: RN note         This patient has been referred to our palliative care services in the community.  [facility] will continue to follow for any discharge planning needs and to coordinate continuation of palliative care in the outpatient setting.    If you have questions or need assistance, please call [phone] or contact the hospital Liaison listed on AMION.      Thank you for this referral.         Mary Anne Robertson, RN, CCM  [facility] Hospital Liaison (listed on AMION under Hospice/[facility])    [phone]     

## 2020-05-16 NOTE — Progress Notes (Signed)
   05/16/20 1032  Mobility  Activity Refused mobility (Patient declined/ Will check back as time permits)

## 2020-05-16 NOTE — Consult Note (Signed)
Consultation Note Date: 05/16/2020   Patient Name: Brian Wolf  DOB: [birth year]  MRN: [id number]  Age / Sex: 87 y.o., male  PCP: [name], DO Referring Physician: No att. providers found  Reason for Consultation: Establishing goals of care  HPI/Patient Profile: 87 y.o. male  with past medical history of HTN, CVA, sinus node dysfunction status post pacemaker, CKD stage IV, GI bleed from duodenal ulcer, and anemia admitted on 05/13/2020 with shortness of breath and weight gain. Diagnosed with acute CHF with EF of 35%. Renal function around baseline, creatinine 2.5. PMT consulted to discuss [place].    Clinical Assessment and Goals of Care: I have reviewed medical records including EPIC notes, labs and imaging, received report from Dr. Reesa Chew, assessed the patient and then met with spouse Brian Wolf  to discuss diagnosis prognosis, [place], EOL wishes, disposition and options.  I introduced Palliative Medicine as specialized medical care for people living with serious illness. It focuses on providing relief from the symptoms and stress of a serious illness. The goal is to improve quality of life for both the patient and the family.  As far as functional and nutritional status, Brian Wolf tells me Brian Wolf walks well but has become less mobile. Needs assistance with ADLs. Decreased appetite. Some confusion.    We discussed patient's current illness and what it means in the larger context of patient's on-going co-morbidities.  Natural disease trajectory and expectations at EOL were discussed. We discuss his heart failure and kidney failure. Discuss concern about repeated exacerbations, hospitalizations.   I attempted to elicit values and goals of care important to the patient.  Brian Wolf tells me the patient wants to be at home, not interested in facility. He also does not want to come back to the hospital. Brian Wolf tells me his mindset is "when it's your time, it's  your time". She tells me about her desire to take great care of him at home and ensure he is not suffering.   Discussed with Brian Wolf the importance of continued conversation with family and the medical providers regarding overall plan of care and treatment options, ensuring decisions are within the context of the patient's values and GOCs.    Hospice services outpatient were explained and offered. Brian Wolf is agreeable to support of hospice. We discuss hospice philosophy of care and amount of support provided.   Questions and concerns were addressed. The family was encouraged to call with questions or concerns.   I called patient's son and HCPOA - we reviewed above conversation. He agrees hospice is appropriate. He shares patient would not want HD and he does not think this would add to his quality of life.   Primary Decision Maker HCPOA - Brian Wolf  SUMMARY OF RECOMMENDATIONS   - hospice support at home - would not want HD  Code Status/Advance Care Planning:  DNR  Prognosis:   < 6 months  Discharge Planning: Home with Hospice      Primary Diagnoses: Present on Admission: . Bronchitis with acute wheezing . Hypertension, essential . Obstructive sleep apnea syndrome . Pleural effusion . Leukocytosis . Elevated troponin . Dyslipidemia . Benign prostatic hyperplasia . CKD (chronic kidney disease), stage IV ([place]) . DNR (do not resuscitate)   I have reviewed the medical record, interviewed the patient and family, and examined the patient. The following aspects are pertinent.  Past Medical History:  Diagnosis Date  . CVA (cerebral infarction) 12/2013  . Dementia ([place])   . Gastric ulcer  prior to 2000  . Hypertension   . Sleep difficulties    Social History   Socioeconomic History  . Marital status: Married    Spouse name: Brian Wolf  . Number of children: 2  . Years of education: college  . Highest education level: Not on file  Occupational History    Comment:  retired  Tobacco Use  . Smoking status: Never Smoker  . Smokeless tobacco: Never Used  Vaping Use  . Vaping Use: Never used  Substance and Sexual Activity  . Alcohol use: No    Alcohol/week: 0.0 standard drinks  . Drug use: No  . Sexual activity: Not on file  Other Topics Concern  . Not on file  Social History Narrative   Patient lives at home with his wife Brian Wolf.   Retired.   Education Retail buyer.   Right handed.   Caffeine two cups of coffee daily.   Social Determinants of Health   Financial Resource Strain: Not on file  Food Insecurity: Not on file  Transportation Needs: Not on file  Physical Activity: Not on file  Stress: Not on file  Social Connections: Not on file   Family History  Problem Relation Age of Onset  . Hyperlipidemia Mother   . Hypertension Mother   . Hypertension Father    Scheduled Meds: . acidophilus  1 capsule Oral q AM  . furosemide  80 mg Intravenous BID  . guaiFENesin  600 mg Oral BID  . hydrALAZINE  50 mg Oral TID  . isosorbide dinitrate  30 mg Oral TID  . latanoprost  1 drop Both Eyes QHS  . loratadine  10 mg Oral Daily  . magnesium oxide  800 mg Oral Q4H  . metoprolol succinate  25 mg Oral Daily  . NIFEdipine  60 mg Oral QHS  . pantoprazole  40 mg Oral BID AC  . simvastatin  40 mg Oral QHS  . sodium chloride flush  3 mL Intravenous Q12H  . tamsulosin  0.4 mg Oral QPC supper   Continuous Infusions: . sodium chloride     PRN Meds:.sodium chloride, acetaminophen, albuterol, lactulose, ondansetron (ZOFRAN) IV, sodium chloride flush Allergies  Allergen Reactions  . Lisinopril Cough  . Nsaids Other (See Comments)    GI bleed(s)    Review of Systems  Constitutional: Positive for activity change and fatigue.  Respiratory: Negative for chest tightness and shortness of breath.   Neurological: Positive for weakness.    Physical Exam Constitutional:      General: He is not in acute distress. Pulmonary:     Effort: Pulmonary  effort is normal.  Musculoskeletal:     Right lower leg: Edema present.     Left lower leg: Edema present.  Skin:    General: Skin is warm and dry.  Neurological:     Mental Status: He is alert.     Vital Signs: BP (!) 141/56 (BP Location: Right Arm)   Pulse 63   Temp 98.2 F (36.8 C)   Resp 17   Ht '5\' 4"'  (1.626 m)   Wt 73.9 kg   SpO2 97%   BMI 27.97 kg/m  Pain Scale: 0-10   Pain Score: 0-No pain   SpO2: SpO2: 97 % O2 Device:SpO2: 97 % O2 Flow Rate: .O2 Flow Rate (L/min): 3 L/min  IO: Intake/output summary:   Intake/Output Summary (Last 24 hours) at 05/16/2020 1345 Last data filed at 05/16/2020 1228 Gross per 24 hour  Intake 676 ml  Output 500  ml  Net 176 ml    LBM: Last BM Date: 05/14/20 Baseline Weight: Weight: 74.8 kg Most recent weight: Weight: 73.9 kg     Palliative Assessment/Data: PPS 50%    Time Total: 85 minutes Greater than 50%  of this time was spent counseling and coordinating care related to the above assessment and plan.  Juel Burrow, DNP, AGNP-C Palliative Medicine Team [phone] Pager: [phone]

## 2020-05-16 NOTE — TOC Initial Note (Signed)
Transition of Care [facility]) - Initial/Assessment Note    Patient Details  Name: Brian Wolf MRN: [id number] Date of Birth: 01/13/1932  [facility]) CM/SW Contact:    Zenon Mayo, RN Phone Number: 05/16/2020, 9:56 AM  Clinical Narrative:                 NCM spoke with patient at bedside, NCM offered choice, he states to speak with his wife, she will be here shortly.  NCM left agency list in room.  Will speak with wife when she arrives.   Expected Discharge Plan: [place] Barriers to Discharge: No Barriers Identified   Patient Goals and CMS Choice Patient states their goals for this hospitalization and ongoing recovery are:: return home CMS Medicare.gov Compare Post Acute Care list provided to:: Patient Represenative (must comment) Choice offered to / list presented to : Spouse  Expected Discharge Plan and Services Expected Discharge Plan: [place]   Discharge Planning Services: CM Consult Post Acute Care Choice: [place] arrangements for the past 2 months: Single Family Home Expected Discharge Date: 05/16/20                 DME Agency: NA                  Prior Living Arrangements/Services Living arrangements for the past 2 months: Single Family Home Lives with:: Spouse Patient language and need for interpreter reviewed:: Yes Do you feel safe going back to the place where you live?: Yes      Need for Family Participation in Patient Care: Yes (Comment) Care giver support system in place?: Yes (comment)   Criminal Activity/Legal Involvement Pertinent to Current Situation/Hospitalization: No - Comment as needed  Activities of Daily Living      Permission Sought/Granted                  Emotional Assessment Appearance:: Appears stated age Attitude/Demeanor/Rapport: Engaged Affect (typically observed): Appropriate Orientation: : Oriented to Self,Oriented to Place,Oriented to   Time,Oriented to Situation Alcohol / Substance Use: Not Applicable Psych Involvement: No (comment)  Admission diagnosis:  CHF (congestive heart failure) ([place]) [I50.9] Patient Active Problem List   Diagnosis Date Noted  . New onset atrial fibrillation ([place])   . CHF (congestive heart failure) ([place]) 05/13/2020  . Bronchitis with acute wheezing 05/13/2020  . Leukocytosis 05/13/2020  . Elevated troponin 05/13/2020  . CKD (chronic kidney disease), stage IV ([place]) 05/13/2020  . DNR (do not resuscitate) 05/13/2020  . Sensorineural hearing loss, bilateral 05/01/2020  . Hyperlipidemia 05/01/2020  . Benign neoplasm of colon 05/01/2020  . Benign prostatic hyperplasia 05/01/2020  . Cardiomyopathy ([place]) 05/01/2020  . Dyslipidemia 05/01/2020  . Glaucoma 05/01/2020  . History of anemia 05/01/2020  . Hypertensive retinopathy 05/01/2020  . Malignant hypertensive chronic kidney disease 05/01/2020  . Personal history of other diseases of the digestive system 05/01/2020  . Sinus node dysfunction ([place]) 05/01/2020  . Malnutrition of moderate degree 04/14/2020  . SOB (shortness of breath)   . Bilateral lower extremity edema 04/05/2020  . Acute blood loss anemia 04/05/2020  . Pleural effusion 04/05/2020  . Acute diastolic CHF (congestive heart failure) ([place]) 04/05/2020  . Acute upper GI bleed 10/17/2019  . Hyperkalemia 10/17/2019  . Acute kidney injury superimposed on chronic kidney disease ([place]) 10/17/2019  . Fall   . Personal history of transient ischemic attack (TIA), and cerebral infarction without residual deficits 03/09/2016  .  Obstructive sleep apnea syndrome 03/09/2016  . SAH (subarachnoid hemorrhage) ([place]) 03/09/2016  . Subarachnoid bleed ([place]) 03/08/2016  . Hypertension, essential   . Sleep difficulties   . Homonymous hemianopsia 01/14/2014  . Homonymous hemianopsia due to recent cerebral infarction 12/24/2013  . Left-sided neglect 12/24/2013  . Gout flare 12/20/2013  . Benign essential  HTN 12/20/2013  . Acute hemorrhagic infarction of brain ([place]) 12/20/2013  . Hemorrhagic stroke ([place])   . Gout   . Hypokalemia 12/19/2013  . Altered mental status   . Encounter for immunization   . Encephalopathy acute   . ICH (intracerebral hemorrhage) ([place]) 12/14/2013  . Fever    PCP:  [name], DO Pharmacy:   [facility] [place], [place] [address] Phone: [phone] Fax: [phone]  [name] [address] [address] Phone: [phone] Fax: [phone]     Social Determinants of Health (SDOH) Interventions    Readmission Risk Interventions Readmission Risk Prevention Plan 05/16/2020  Transportation Screening Complete  PCP or Specialist Appt within 3-5 Days Complete  HRI or [place] Complete  Social Work Consult for [place] Planning/Counseling Complete  Palliative Care Screening Not Applicable  Medication Review Press photographer) Complete  Some recent data might be hidden

## 2020-05-16 NOTE — Progress Notes (Signed)
[facility] Progress Note   32 y/ M with history of CKD stage 4,stroke (2015) with mild residual weakness of left facial and left upper extremity weakness, HTN, hyperlipidemia, sinus node dysfunction, Hx of recurrent upper GI bleed 2/2 duodenal ulcer, combined systolic and diastolic heart failure recently diagnosed a month ago (EF35-40%) p/w  weight gain, SOB, leg swelling and admitted for CHF exacerbation.    Assessment/ Plan:   1. CKD stage 4: (GFR 24): new baseline Cr ~2.4-2.6 and creatinine here not far from baseline at 2.9 today.  SOB  improved.  Recent AKI.  Continues to be volume overloaded with stable creatinine.  Despite improvement in his symptoms his weight is unchanged. -Increase Lasix to dose that would be appropriate for his GFR; IV lasix 80mg  BID and assess response -Renal function daily, Strict I and Os, daily weights -Fluid restriction to 1.2 L /24hr  2. Acute on Chronic CHF: Remains volume overloaded.  Weight unchanged.  Increase Lasix as above  Per cardiology, patient is not a candidate for further invasive cardiac testing.  3. HTN: Controlled on home medications: Hydralazine, Metoprolol, Nifedipine 4. Rt side pleural effusion: She has stabe rt side pleural effusion on CXR (w prior Hx of thoracentesis with removal of 1.2 L of fluid last hospitalization). The fluid was transudate when analyzed  5.  Anemia: Multifactorial with CKD minimally contributing.  Hemoglobin 9.6.  Continue to monitor  Subjective:   Patient states he feels well today with improved shortness of breath.  No other complaints   Objective:   BP (!) 143/61 (BP Location: Right Arm)   Pulse 69   Temp 97.7 F (36.5 C) (Oral)   Resp 18   Ht 5\' 4"  (1.626 m)   Wt 73.9 kg   SpO2 96%   BMI 27.97 kg/m   Intake/Output Summary (Last 24 hours) at 05/16/2020 0946 Last data filed at 05/16/2020 0908 Gross per 24 hour  Intake 556 ml  Output 705 ml  Net -149 ml   Weight change: 1.6  kg  Physical Exam: GEN: NAD,, lying in bed HEENT: Moist mucous membranes, no nasal discharge, EOMI NECK: Supple, no thyromegaly LUNGS: Clear to auscultation anteriorly, mild increased work of breathing CV: RRR, No M/R/G ABD: SNDNT +BS  EXT: 1+ lower extremity edema    Imaging: No results found.  Labs: BMET Recent Labs  Lab 05/13/20 1056 05/14/20 0550 05/15/20 0355 05/16/20 0226  NA 137 137 137 134*  K 4.3 4.5 4.4 4.4  CL 108 107 107 106  CO2 20* 20* 21* 21*  GLUCOSE 99 149* 104* 96  BUN 57* 60* 69* 72*  CREATININE 2.63* 2.58* 2.94* 2.90*  CALCIUM 8.2* 8.5* 8.3* 8.1*   CBC Recent Labs  Lab 05/13/20 1056 05/14/20 0550 05/15/20 0355 05/16/20 0226  WBC 10.9* 4.7 13.3* 10.1  NEUTROABS 5.1  --   --   --   HGB 10.0* 10.5* 9.4* 9.6*  HCT 30.9* 32.8* 28.8* 29.4*  MCV 95.1 95.9 93.8 93.0  PLT 286 312 305 312    Medications:    . acidophilus  1 capsule Oral q AM  . furosemide  80 mg Intravenous BID  . guaiFENesin  600 mg Oral BID  . hydrALAZINE  50 mg Oral TID  . isosorbide dinitrate  30 mg Oral TID  . latanoprost  1 drop Both Eyes QHS  . loratadine  10 mg Oral Daily  . magnesium oxide  800 mg Oral Q4H  . metoprolol succinate  25  mg Oral Daily  . NIFEdipine  60 mg Oral QHS  . pantoprazole  40 mg Oral BID AC  . simvastatin  40 mg Oral QHS  . sodium chloride flush  3 mL Intravenous Q12H  . tamsulosin  0.4 mg Oral QPC supper      Reesa Chew  05/16/2020, 9:46 AM

## 2020-05-16 NOTE — TOC Transition Note (Signed)
Transition of Care [facility]) - CM/SW Discharge Note   Patient Details  Name: Brian Wolf MRN: [id number] Date of Birth: [birth year]  Transition of Care [facility]) CM/SW Contact:  Zenon Mayo, RN Phone Number: 05/16/2020, 12:43 PM   Clinical Narrative:    Patient is for dc today home with hospice,offered choice, she chose [facility].  NCM made referral to [facility] with [facility], they will supply the DME . Wife states patient will ride home via car.     Final next level of care: Home w Hospice Care Barriers to Discharge: No Barriers Identified   Patient Goals and CMS Choice Patient states their goals for this hospitalization and ongoing recovery are:: return home with hospice CMS Medicare.gov Compare Post Acute Care list provided to:: Patient Represenative (must comment) Choice offered to / list presented to : Spouse  Discharge Placement                       Discharge Plan and Services   Discharge Planning Services: CM Consult Post Acute Care Choice: Home Health          DME Arranged:  (hospice will supply) DME Agency: NA       HH Arranged: RN HH Agency:  Public house manager) Date HH Agency Contacted: 05/16/20 Time [place]: 1239 Representative spoke with at [place]: [place] ([place]) Interventions     Readmission Risk Interventions Readmission Risk Prevention Plan 05/16/2020  Transportation Screening Complete  PCP or Specialist Appt within 3-5 Days Complete  HRI or [place] Complete  Social Work Consult for [place] Planning/Counseling Complete  Palliative Care Screening Not Applicable  Medication Review Press photographer) Complete  Some recent data might be hidden

## 2020-05-16 NOTE — Progress Notes (Signed)
RN provided patient's wife with verbal d/c instructions. RN answered all questions. IV removed. Furosemide brought to bedside by TOC. Potassium called into [facility] on [address] per req of wife. Pt belonging sent with patient. NT d/c patient via wheelchair to [employer]	[place] entrance to private vehicle.

## 2020-05-16 NOTE — Consult Note (Signed)
   [facility] CM Inpatient Consult   05/16/2020  Brian Wolf December 28, 1931 [id number]  [place] Organization [ACO] Patient: [name]  Patient was assessed for readmission less than 30 days and for restart of [place] Management for [employer]. Patient was previously active with [place] Management Coordinator from a recent hospitalization.  Met with patient at bedside regarding being restarted with [facility] services. Patient states, "my wife is on the way here from her appointment but I do appreciate the follow up calls. Patient had also been referred for home palliative care services from prior admission.  Primary Care Providers:  [name], DO in [facility], office who lis listed for the post hospital TOC calls and follow up.  Plan:  Follow up with inpatient [facility] team for disposition needs, patient is home with palliative care verses hospice.  Of note, [facility] Care Management services does not replace or interfere with any services that are arranged by inpatient [facility] care management team.   For additional questions or referrals please contact:  Natividad Brood, RN BSN [place] [facility] Liaison  [phone] business mobile phone Toll free office [phone]  Fax number: [phone] [email or web address]

## 2020-05-20 ENCOUNTER — Ambulatory Visit: Payer: Medicare Other | Admitting: Student

## 2020-05-23 ENCOUNTER — Telehealth: Payer: Self-pay

## 2020-05-23 NOTE — Telephone Encounter (Signed)
Tried calling patient's wife no answer left vm to call back

## 2020-05-23 NOTE — Telephone Encounter (Signed)
Patient's wife called regarding patient's weight is currently 150lb and that he is peeing every 30 minutes to an hr she is wondering if his lasix needs to be cut down please advise

## 2020-05-23 NOTE — Telephone Encounter (Signed)
Please advise her to continue with Lasix 40 mg BID for now. Ask if hospice can do lab work, if so please put in order for BMP.

## 2020-05-26 NOTE — Telephone Encounter (Signed)
Spoke to patient's wife she is aware

## 2020-05-27 ENCOUNTER — Other Ambulatory Visit: Payer: Self-pay

## 2020-05-27 DIAGNOSIS — I10 Essential (primary) hypertension: Secondary | ICD-10-CM

## 2020-05-27 DIAGNOSIS — I5042 Chronic combined systolic (congestive) and diastolic (congestive) heart failure: Secondary | ICD-10-CM

## 2020-05-29 ENCOUNTER — Other Ambulatory Visit: Payer: Self-pay | Admitting: *Deleted

## 2020-05-29 NOTE — Patient Outreach (Signed)
[place] [facility]) Care Management  05/29/2020  Brian Wolf 04-07-1931 [id number]   [facility] multidisciplinary care discussion  Pt case information reviewed with team members Pt not active with [facility]  Pt active with [facility] home hospice     [name] Lavina Hamman, RN, BSN, [place] Coordinator Office number [phone] Main [facility] number [phone] Fax number [phone]

## 2020-06-15 ENCOUNTER — Encounter (HOSPITAL_COMMUNITY): Payer: Self-pay | Admitting: Emergency Medicine

## 2020-06-15 ENCOUNTER — Other Ambulatory Visit: Payer: Self-pay

## 2020-06-15 ENCOUNTER — Emergency Department (HOSPITAL_COMMUNITY)
Admission: EM | Admit: 2020-06-15 | Discharge: 2020-06-15 | Disposition: A | Discharge status: Home / Self Care | Payer: Medicare Other | Attending: Emergency Medicine | Admitting: Emergency Medicine

## 2020-06-15 DIAGNOSIS — R197 Diarrhea, unspecified: Secondary | ICD-10-CM

## 2020-06-15 DIAGNOSIS — R110 Nausea: Secondary | ICD-10-CM | POA: Diagnosis not present

## 2020-06-15 LAB — COMPREHENSIVE METABOLIC PANEL
ALT: 27 U/L (ref 0–44)
AST: 43 U/L — ABNORMAL HIGH (ref 15–41)
Albumin: 2.8 g/dL — ABNORMAL LOW (ref 3.5–5.0)
Alkaline Phosphatase: 113 U/L (ref 38–126)
Anion gap: 10 (ref 5–15)
BUN: 70 mg/dL — ABNORMAL HIGH (ref 8–23)
CO2: 16 mmol/L — ABNORMAL LOW (ref 22–32)
Calcium: 8.5 mg/dL — ABNORMAL LOW (ref 8.9–10.3)
Chloride: 112 mmol/L — ABNORMAL HIGH (ref 98–111)
Creatinine, Ser: 2.59 mg/dL — ABNORMAL HIGH (ref 0.61–1.24)
GFR, Estimated: 23 mL/min — ABNORMAL LOW (ref >60)
Glucose, Bld: 105 mg/dL — ABNORMAL HIGH (ref 70–99)
Potassium: 5.1 mmol/L (ref 3.5–5.1)
Sodium: 138 mmol/L (ref 135–145)
Total Bilirubin: 0.7 mg/dL (ref 0.3–1.2)
Total Protein: 6.4 g/dL — ABNORMAL LOW (ref 6.5–8.1)

## 2020-06-15 LAB — CBC
HCT: 37.2 % — ABNORMAL LOW (ref 39.0–52.0)
Hemoglobin: 11.9 g/dL — ABNORMAL LOW (ref 13.0–17.0)
MCH: 30.4 pg (ref 26.0–34.0)
MCHC: 32 g/dL (ref 30.0–36.0)
MCV: 94.9 fL (ref 80.0–100.0)
Platelets: 285 10*3/uL (ref 150–400)
RBC: 3.92 MIL/uL — ABNORMAL LOW (ref 4.22–5.81)
RDW: 15.9 % — ABNORMAL HIGH (ref 11.5–15.5)
WBC: 12.6 10*3/uL — ABNORMAL HIGH (ref 4.0–10.5)
nRBC: 0 % (ref 0.0–0.2)

## 2020-06-15 LAB — URINALYSIS, ROUTINE W REFLEX MICROSCOPIC
Bilirubin Urine: NEGATIVE
Glucose, UA: 50 mg/dL — AB
Hgb urine dipstick: NEGATIVE
Ketones, ur: NEGATIVE mg/dL
Leukocytes,Ua: NEGATIVE
Nitrite: NEGATIVE
Protein, ur: >=300 mg/dL — AB
Specific Gravity, Urine: 1.013 (ref 1.005–1.030)
pH: 5 (ref 5.0–8.0)

## 2020-06-15 LAB — LIPASE, BLOOD: Lipase: 43 U/L (ref 11–51)

## 2020-06-15 MED ORDER — ONDANSETRON HCL 4 MG/2ML IJ SOLN
4.0000 mg | Freq: Once | INTRAMUSCULAR | Status: AC
  Administered 2020-06-15: 4 mg via INTRAVENOUS
  Filled 2020-06-15: qty 2

## 2020-06-15 MED ORDER — SODIUM CHLORIDE 0.9 % IV BOLUS
500.0000 mL | Freq: Once | INTRAVENOUS | Status: AC
  Administered 2020-06-15: 500 mL via INTRAVENOUS

## 2020-06-15 MED ORDER — ONDANSETRON 4 MG PO TBDP: 4.0000 mg | ORAL_TABLET | Freq: Three times a day (TID) | ORAL | 0 refills | Status: AC | PRN

## 2020-06-15 NOTE — Discharge Instructions (Addendum)

## 2020-06-15 NOTE — ED Provider Notes (Signed)
[facility] EMERGENCY DEPARTMENT Provider Note   CSN: [id number] Arrival date & time: 06/15/20  0941     History Chief Complaint  Patient presents with  . Diarrhea  . decreased appetite    Brian Wolf is a 87 y.o. male who presents for weakness and diarrhea. Hx is given by the patient and his wife.  Patient has a past medical history of previous CVA, mild dementia, CKD, hypertension, previous gastric ulcer.  She states that 4 days ago the patient lost his appetite.  He has very poor oral oral intake since that time.  Saturday he began having severe nausea without vomiting.  She says that yesterday he had multiple episodes of watery diarrhea and by the end of the day noticed he had some small black flakes in his watery diarrhea but no frank blood.  He is not complaining of abdominal pain.  He has not been on any recent antibiotics.  He has not vomited.  He denies chest pain, shortness of breath.  He has not had any fever, chills, ingestion of suspicious foods, recent foreign travel.  He is fully vaccinated and boosted against the coronavirus.  He had his flu shot in October 2021.  HPI     Past Medical History:  Diagnosis Date  . CVA (cerebral infarction) 12/2013  . Dementia ([place])   . Gastric ulcer    prior to 2000  . Hypertension   . Sleep difficulties     Patient Active Problem List   Diagnosis Date Noted  . New onset atrial fibrillation ([place])   . CHF (congestive heart failure) ([place]) 05/13/2020  . Bronchitis with acute wheezing 05/13/2020  . Leukocytosis 05/13/2020  . Elevated troponin 05/13/2020  . CKD (chronic kidney disease), stage IV ([place]) 05/13/2020  . DNR (do not resuscitate) 05/13/2020  . Sensorineural hearing loss, bilateral 05/01/2020  . Hyperlipidemia 05/01/2020  . Benign neoplasm of colon 05/01/2020  . Benign prostatic hyperplasia 05/01/2020  . Cardiomyopathy ([place]) 05/01/2020  . Dyslipidemia 05/01/2020  . Glaucoma 05/01/2020  . History  of anemia 05/01/2020  . Hypertensive retinopathy 05/01/2020  . Malignant hypertensive chronic kidney disease 05/01/2020  . Personal history of other diseases of the digestive system 05/01/2020  . Sinus node dysfunction ([place]) 05/01/2020  . Malnutrition of moderate degree 04/14/2020  . SOB (shortness of breath)   . Bilateral lower extremity edema 04/05/2020  . Acute blood loss anemia 04/05/2020  . Pleural effusion 04/05/2020  . Acute diastolic CHF (congestive heart failure) ([place]) 04/05/2020  . Acute upper GI bleed 10/17/2019  . Hyperkalemia 10/17/2019  . Acute kidney injury superimposed on chronic kidney disease ([place]) 10/17/2019  . Fall   . Personal history of transient ischemic attack (TIA), and cerebral infarction without residual deficits 03/09/2016  . Obstructive sleep apnea syndrome 03/09/2016  . SAH (subarachnoid hemorrhage) ([place]) 03/09/2016  . Subarachnoid bleed ([place]) 03/08/2016  . Hypertension, essential   . Sleep difficulties   . Homonymous hemianopsia 01/14/2014  . Homonymous hemianopsia due to recent cerebral infarction 12/24/2013  . Left-sided neglect 12/24/2013  . Gout flare 12/20/2013  . Benign essential HTN 12/20/2013  . Acute hemorrhagic infarction of brain ([place]) 12/20/2013  . Hemorrhagic stroke ([place])   . Gout   . Hypokalemia 12/19/2013  . Altered mental status   . Encounter for immunization   . Encephalopathy acute   . ICH (intracerebral hemorrhage) ([place]) 12/14/2013  . Fever     Past Surgical History:  Procedure Laterality Date  . ABDOMINAL  SURGERY    . BIOPSY  04/06/2020   Procedure: BIOPSY;  Surgeon: Ronald Lobo, MD;  Location: [place];  Service: Endoscopy;;  . CATARACT EXTRACTION Left   . ESOPHAGOGASTRODUODENOSCOPY N/A 10/18/2019   Procedure: ESOPHAGOGASTRODUODENOSCOPY (EGD);  Surgeon: Wonda Horner, MD;  Location[facility] ENDOSCOPY;  Service: Endoscopy;  Laterality: N/A;  . ESOPHAGOGASTRODUODENOSCOPY (EGD) WITH PROPOFOL N/A 04/06/2020   Procedure:  ESOPHAGOGASTRODUODENOSCOPY (EGD) WITH PROPOFOL;  Surgeon: Ronald Lobo, MD;  Location: [place];  Service: Endoscopy;  Laterality: N/A;  . IR THORACENTESIS ASP PLEURAL SPACE W/IMG GUIDE  04/08/2020       Family History  Problem Relation Age of Onset  . Hyperlipidemia Mother   . Hypertension Mother   . Hypertension Father     Social History   Tobacco Use  . Smoking status: Never Smoker  . Smokeless tobacco: Never Used  Vaping Use  . Vaping Use: Never used  Substance Use Topics  . Alcohol use: No    Alcohol/week: 0.0 standard drinks  . Drug use: No    Home Medications Prior to Admission medications   Medication Sig Start Date End Date Taking? Authorizing Provider  Cholecalciferol 50 MCG (2000 UT) TABS Take 2,000 Units by mouth daily. 05/28/20  Yes [provider]  fexofenadine (ALLEGRA) 180 MG tablet Take 180 mg by mouth in the morning.   Yes [provider]  furosemide (LASIX) 40 MG tablet Take 1 tablet (40 mg total) by mouth 2 (two) times daily. 05/16/20 06/15/20 Yes Amin, Jeanella Flattery, MD  hydrALAZINE (APRESOLINE) 50 MG tablet Take 1 tablet (50 mg total) by mouth 3 (three) times daily. 11/21/19  Yes Cantwell, Celeste C, PA-C  isosorbide dinitrate (ISORDIL) 30 MG tablet Take 1 tablet (30 mg total) by mouth 3 (three) times daily. 09/03/19  Yes Adrian Prows, MD  levothyroxine (SYNTHROID) 25 MCG tablet Take 25 mcg by mouth See admin instructions. Take by mouth every morning before a meal 05/27/20  Yes [provider]  metoprolol succinate (TOPROL-XL) 50 MG 24 hr tablet Take 50 mg by mouth daily.   Yes [provider]  mupirocin ointment (BACTROBAN) 2 % Apply 1 application topically daily as needed for irritation. 05/27/20  Yes [provider]  NIFEdipine (ADALAT CC) 60 MG 24 hr tablet Take 60 mg by mouth at bedtime.   Yes [provider]  ondansetron (ZOFRAN ODT) 4 MG disintegrating tablet Take 1 tablet (4 mg total) by mouth every 8  (eight) hours as needed for nausea or vomiting. 06/15/20  Yes Shantara Goosby, PA-C  potassium chloride SA (KLOR-CON) 20 MEQ tablet Take 1 tablet (20 mEq total) by mouth 2 (two) times daily. 05/16/20 06/15/20 Yes Amin, Jeanella Flattery, MD  Probiotic Product (PROBIOTIC DAILY PO) Take 1 capsule by mouth in the morning.   Yes [provider]  simvastatin (ZOCOR) 40 MG tablet Take 40 mg by mouth at bedtime. 11/30/13  Yes [provider]  tamsulosin (FLOMAX) 0.4 MG CAPS capsule Take 0.4 mg by mouth daily after supper. 06/03/16  Yes [provider]  Travoprost, BAK Free, (TRAVATAN) 0.004 % SOLN ophthalmic solution Place 1 drop into both eyes every evening.   Yes [provider]  lactulose (CHRONULAC) 10 GM/15ML solution Take 15 mLs (10 g total) by mouth daily as needed for mild constipation. Patient not taking: Reported on 06/15/2020 04/18/20   Kathie Dike, MD  metoprolol succinate (TOPROL-XL) 25 MG 24 hr tablet Take 1 tablet (25 mg total) by mouth daily. Patient not  taking: Reported on 06/15/2020 04/19/20   Kathie Dike, MD  pantoprazole (PROTONIX) 40 MG tablet Take 1 tablet (40 mg total) by mouth 2 (two) times daily before a meal. Patient not taking: Reported on 06/15/2020 10/22/19   Shawna Clamp, MD    Allergies    Lisinopril and Nsaids  Review of Systems   Review of Systems Ten systems reviewed and are negative for acute change, except as noted in the HPI.   Physical Exam Updated Vital Signs BP (!) 137/53 (BP Location: Right Arm)   Pulse 64   Temp 97.9 F (36.6 C) (Axillary)   Resp 16   Ht 5\' 4"  (1.626 m)   Wt 68 kg   SpO2 97%   BMI 25.75 kg/m   Physical Exam Vitals and nursing note reviewed.  Constitutional:      General: He is not in acute distress.    Appearance: He is well-developed. He is not diaphoretic.  HENT:     Head: Normocephalic and atraumatic.  Eyes:     General: No scleral icterus.    Conjunctiva/sclera: Conjunctivae normal.   Cardiovascular:     Rate and Rhythm: Normal rate and regular rhythm.     Heart sounds: Normal heart sounds.  Pulmonary:     Effort: Pulmonary effort is normal. No respiratory distress.     Breath sounds: Normal breath sounds.  Abdominal:     General: Bowel sounds are increased. There is no distension.     Palpations: Abdomen is soft.     Tenderness: There is no abdominal tenderness.  Musculoskeletal:     Cervical back: Normal range of motion and neck supple.  Skin:    General: Skin is warm and dry.  Neurological:     Mental Status: He is alert.  Psychiatric:        Behavior: Behavior normal.     ED Results / Procedures / Treatments   Labs (all labs ordered are listed, but only abnormal results are displayed) Labs Reviewed  COMPREHENSIVE METABOLIC PANEL - Abnormal; Notable for the following components:      Result Value   Chloride 112 (*)    CO2 16 (*)    Glucose, Bld 105 (*)    BUN 70 (*)    Creatinine, Ser 2.59 (*)    Calcium 8.5 (*)    Total Protein 6.4 (*)    Albumin 2.8 (*)    AST 43 (*)    GFR, Estimated 23 (*)    All other components within normal limits  CBC - Abnormal; Notable for the following components:   WBC 12.6 (*)    RBC 3.92 (*)    Hemoglobin 11.9 (*)    HCT 37.2 (*)    RDW 15.9 (*)    All other components within normal limits  URINALYSIS, ROUTINE W REFLEX MICROSCOPIC - Abnormal; Notable for the following components:   APPearance HAZY (*)    Glucose, UA 50 (*)    Protein, ur >=300 (*)    Bacteria, UA RARE (*)    All other components within normal limits  LIPASE, BLOOD    EKG None  Radiology No results found.  Procedures Procedures   Medications Ordered in ED Medications  ondansetron (ZOFRAN) injection 4 mg (4 mg Intravenous Given 06/15/20 1405)  sodium chloride 0.9 % bolus 500 mL (0 mLs Intravenous Stopped 06/15/20 1537)    ED Course  I have reviewed the triage vital signs and the nursing notes.  Pertinent labs & imaging results  that were available during my care of the patient were reviewed by me and considered in my medical decision making (see chart for details).    MDM Rules/Calculators/A&P                          YO:VZCHYIFO, vomiting  VS:  Vitals:   06/15/20 1430 06/15/20 1445 06/15/20 1500 06/15/20 1539  BP: (!) 129/49 (!) 123/44 (!) 132/55 (!) 137/53  Pulse: 62 60 (!) 58 64  Resp:  19 16 16   Temp:    97.9 F (36.6 C)  TempSrc:    Axillary  SpO2: 95% 94% 94% 97%  Weight:      Height:        YD:XAJOINO is gathered by patient and wife. Previous records obtained and reviewed. DDX:The patient's complaint of diarrhea involves an extensive number of diagnostic and treatment options, and is a complaint that carries with it a high risk of complications, morbidity, and potential mortality. Given the large differential diagnosis, medical decision making is of high complexity. The differential diagnosis of diarrhea includes but is not limited to Viral- norovirus/rotavirus; Bacterial-Campylobacter,Shigella, Salmonella, Escherichia coli, E. coli 0157:H7, Yersinia enterocolitica, Vibrio cholerae, Clostridium difficile. Parasitic- Giardia lamblia, Cryptosporidium,Entamoeba histolytica,Cyclospora, Microsporidium. Toxin- Staphylococcus aureus, Bacillus cereus. Noninfectious causes include GI Bleed, Appendicitis, Mesenteric Ischemia, Diverticulitis, Adrenal Crisis, Thyroid Storm, Toxicologic exposures, Antibiotic or drug-associated, inflammatory bowel disease.  Labs: I ordered reviewed and interpreted labs which include CBC with mildly elevated white blood cell count, CMP with baseline renal insufficiency, urine without evidence of infection. Imaging:  EKG: Consults: MDM: Patient here with diarrhea.  He was also having significant nausea.  He was given Zofran with great improvement in his symptoms and asking to eat.  Patient was able to eat crackers and drink ginger ale and felt greatly improved.  He has not had any  episodes of diarrhea here in the emergency department.  The remainder of his work-up is reassuring.  Patient will be discharged home with Zofran to continue to keep his nausea under control.  Discussed outpatient follow-up and return precautions. Patient disposition:The patient appears reasonably screened and/or stabilized for discharge and I doubt any other medical condition or other [facility] requiring further screening, evaluation, or treatment in the ED at this time prior to discharge. I have discussed lab and/or imaging findings with the patient and answered all questions/concerns to the best of my ability.I have discussed return precautions and OP follow up.    Final Clinical Impression(s) / ED Diagnoses Final diagnoses:  Nausea  Diarrhea, unspecified type    Rx / DC Orders ED Discharge Orders         Ordered    ondansetron (ZOFRAN ODT) 4 MG disintegrating tablet  Every 8 hours PRN        06/15/20 1517           Margarita Mail, PA-C 06/18/20 2329    [name], MD 06/19/20 [phone]

## 2020-06-15 NOTE — ED Triage Notes (Signed)
C/o no appetite since Thursday.  Reports diarrhea since yesterday with nausea.  Denies pain.

## 2020-06-15 NOTE — ED Provider Notes (Signed)
Emergency Medicine Provider Triage Evaluation Note  Brian Wolf , a 87 y.o. male  was evaluated in triage.  Pt complains of loss of appetite  Review of Systems  Positive: diarrhea Negative: No fever  Physical Exam  BP (!) 123/58   Pulse (!) 57   Temp (!) 97.5 F (36.4 C)   Resp 14   SpO2 98%  Gen:   Awake, no distress   Resp:  Normal effort  MSK:   Moves extremities without difficulty  Other:    Medical Decision Making  Medically screening exam initiated at 10:06 AM.  Appropriate orders placed.  Brian Wolf was informed that the remainder of the evaluation will be completed by another provider, this initial triage assessment does not replace that evaluation, and the importance of remaining in the ED until their evaluation is complete.     Brian Wolf 06/15/20 1007    Brian Belling, MD 06/16/20 1451

## 2020-06-20 ENCOUNTER — Encounter (HOSPITAL_COMMUNITY): Payer: Self-pay | Admitting: Internal Medicine

## 2020-06-20 ENCOUNTER — Inpatient Hospital Stay (HOSPITAL_COMMUNITY)
Admission: EM | Admit: 2020-06-20 | Discharge: 2020-07-01 | DRG: 377 | Disposition: A | Discharge status: Home Health | Payer: Medicare Other | Attending: Family Medicine | Admitting: Family Medicine

## 2020-06-20 ENCOUNTER — Other Ambulatory Visit: Payer: Self-pay

## 2020-06-20 DIAGNOSIS — H409 Unspecified glaucoma: Secondary | ICD-10-CM | POA: Diagnosis not present

## 2020-06-20 DIAGNOSIS — N179 Acute kidney failure, unspecified: Secondary | ICD-10-CM | POA: Diagnosis present

## 2020-06-20 DIAGNOSIS — Z8701 Personal history of pneumonia (recurrent): Secondary | ICD-10-CM | POA: Diagnosis not present

## 2020-06-20 DIAGNOSIS — J811 Chronic pulmonary edema: Secondary | ICD-10-CM | POA: Diagnosis not present

## 2020-06-20 DIAGNOSIS — R7989 Other specified abnormal findings of blood chemistry: Secondary | ICD-10-CM | POA: Diagnosis not present

## 2020-06-20 DIAGNOSIS — E875 Hyperkalemia: Secondary | ICD-10-CM | POA: Diagnosis present

## 2020-06-20 DIAGNOSIS — G4733 Obstructive sleep apnea (adult) (pediatric): Secondary | ICD-10-CM | POA: Diagnosis not present

## 2020-06-20 DIAGNOSIS — Z20822 Contact with and (suspected) exposure to covid-19: Secondary | ICD-10-CM | POA: Diagnosis not present

## 2020-06-20 DIAGNOSIS — Z8673 Personal history of transient ischemic attack (TIA), and cerebral infarction without residual deficits: Secondary | ICD-10-CM

## 2020-06-20 DIAGNOSIS — Z9989 Dependence on other enabling machines and devices: Secondary | ICD-10-CM | POA: Insufficient documentation

## 2020-06-20 DIAGNOSIS — E785 Hyperlipidemia, unspecified: Secondary | ICD-10-CM | POA: Diagnosis not present

## 2020-06-20 DIAGNOSIS — Z79890 Hormone replacement therapy (postmenopausal): Secondary | ICD-10-CM

## 2020-06-20 DIAGNOSIS — Z1621 Resistance to vancomycin: Secondary | ICD-10-CM | POA: Diagnosis not present

## 2020-06-20 DIAGNOSIS — K922 Gastrointestinal hemorrhage, unspecified: Secondary | ICD-10-CM | POA: Diagnosis not present

## 2020-06-20 DIAGNOSIS — R531 Weakness: Secondary | ICD-10-CM | POA: Diagnosis not present

## 2020-06-20 DIAGNOSIS — R338 Other retention of urine: Secondary | ICD-10-CM | POA: Diagnosis not present

## 2020-06-20 DIAGNOSIS — Z87898 Personal history of other specified conditions: Secondary | ICD-10-CM | POA: Diagnosis not present

## 2020-06-20 DIAGNOSIS — R8271 Bacteriuria: Secondary | ICD-10-CM | POA: Diagnosis not present

## 2020-06-20 DIAGNOSIS — Z66 Do not resuscitate: Secondary | ICD-10-CM | POA: Diagnosis not present

## 2020-06-20 DIAGNOSIS — I610 Nontraumatic intracerebral hemorrhage in hemisphere, subcortical: Secondary | ICD-10-CM | POA: Diagnosis not present

## 2020-06-20 DIAGNOSIS — Z83438 Family history of other disorder of lipoprotein metabolism and other lipidemia: Secondary | ICD-10-CM

## 2020-06-20 DIAGNOSIS — R6889 Other general symptoms and signs: Secondary | ICD-10-CM | POA: Diagnosis not present

## 2020-06-20 DIAGNOSIS — Z515 Encounter for palliative care: Secondary | ICD-10-CM | POA: Diagnosis not present

## 2020-06-20 DIAGNOSIS — F0390 Unspecified dementia without behavioral disturbance: Secondary | ICD-10-CM | POA: Diagnosis present

## 2020-06-20 DIAGNOSIS — I499 Cardiac arrhythmia, unspecified: Secondary | ICD-10-CM | POA: Diagnosis not present

## 2020-06-20 DIAGNOSIS — R58 Hemorrhage, not elsewhere classified: Secondary | ICD-10-CM | POA: Diagnosis not present

## 2020-06-20 DIAGNOSIS — E860 Dehydration: Secondary | ICD-10-CM

## 2020-06-20 DIAGNOSIS — B952 Enterococcus as the cause of diseases classified elsewhere: Secondary | ICD-10-CM | POA: Diagnosis not present

## 2020-06-20 DIAGNOSIS — M109 Gout, unspecified: Secondary | ICD-10-CM | POA: Diagnosis present

## 2020-06-20 DIAGNOSIS — K921 Melena: Secondary | ICD-10-CM | POA: Diagnosis not present

## 2020-06-20 DIAGNOSIS — Z886 Allergy status to analgesic agent status: Secondary | ICD-10-CM

## 2020-06-20 DIAGNOSIS — D62 Acute posthemorrhagic anemia: Secondary | ICD-10-CM | POA: Diagnosis not present

## 2020-06-20 DIAGNOSIS — Z79899 Other long term (current) drug therapy: Secondary | ICD-10-CM

## 2020-06-20 DIAGNOSIS — I5042 Chronic combined systolic (congestive) and diastolic (congestive) heart failure: Secondary | ICD-10-CM | POA: Diagnosis not present

## 2020-06-20 DIAGNOSIS — Z96642 Presence of left artificial hip joint: Secondary | ICD-10-CM | POA: Diagnosis present

## 2020-06-20 DIAGNOSIS — J90 Pleural effusion, not elsewhere classified: Secondary | ICD-10-CM | POA: Diagnosis present

## 2020-06-20 DIAGNOSIS — R0602 Shortness of breath: Secondary | ICD-10-CM | POA: Diagnosis not present

## 2020-06-20 DIAGNOSIS — R600 Localized edema: Secondary | ICD-10-CM | POA: Diagnosis not present

## 2020-06-20 DIAGNOSIS — K269 Duodenal ulcer, unspecified as acute or chronic, without hemorrhage or perforation: Secondary | ICD-10-CM | POA: Diagnosis not present

## 2020-06-20 DIAGNOSIS — I130 Hypertensive heart and chronic kidney disease with heart failure and stage 1 through stage 4 chronic kidney disease, or unspecified chronic kidney disease: Secondary | ICD-10-CM | POA: Diagnosis not present

## 2020-06-20 DIAGNOSIS — J9811 Atelectasis: Secondary | ICD-10-CM | POA: Diagnosis not present

## 2020-06-20 DIAGNOSIS — E039 Hypothyroidism, unspecified: Secondary | ICD-10-CM | POA: Diagnosis not present

## 2020-06-20 DIAGNOSIS — N401 Enlarged prostate with lower urinary tract symptoms: Secondary | ICD-10-CM | POA: Diagnosis present

## 2020-06-20 DIAGNOSIS — J9601 Acute respiratory failure with hypoxia: Secondary | ICD-10-CM | POA: Diagnosis not present

## 2020-06-20 DIAGNOSIS — D72829 Elevated white blood cell count, unspecified: Secondary | ICD-10-CM | POA: Diagnosis present

## 2020-06-20 DIAGNOSIS — Z7189 Other specified counseling: Secondary | ICD-10-CM | POA: Diagnosis not present

## 2020-06-20 DIAGNOSIS — I10 Essential (primary) hypertension: Secondary | ICD-10-CM | POA: Insufficient documentation

## 2020-06-20 DIAGNOSIS — Z888 Allergy status to other drugs, medicaments and biological substances status: Secondary | ICD-10-CM

## 2020-06-20 DIAGNOSIS — Z9889 Other specified postprocedural states: Secondary | ICD-10-CM

## 2020-06-20 DIAGNOSIS — I517 Cardiomegaly: Secondary | ICD-10-CM | POA: Diagnosis not present

## 2020-06-20 DIAGNOSIS — S2231XA Fracture of one rib, right side, initial encounter for closed fracture: Secondary | ICD-10-CM | POA: Diagnosis not present

## 2020-06-20 DIAGNOSIS — N184 Chronic kidney disease, stage 4 (severe): Secondary | ICD-10-CM | POA: Diagnosis not present

## 2020-06-20 DIAGNOSIS — Z743 Need for continuous supervision: Secondary | ICD-10-CM | POA: Diagnosis not present

## 2020-06-20 DIAGNOSIS — D509 Iron deficiency anemia, unspecified: Secondary | ICD-10-CM | POA: Diagnosis not present

## 2020-06-20 DIAGNOSIS — K264 Chronic or unspecified duodenal ulcer with hemorrhage: Secondary | ICD-10-CM | POA: Diagnosis not present

## 2020-06-20 DIAGNOSIS — Z8249 Family history of ischemic heart disease and other diseases of the circulatory system: Secondary | ICD-10-CM

## 2020-06-20 DIAGNOSIS — R091 Pleurisy: Secondary | ICD-10-CM | POA: Diagnosis not present

## 2020-06-20 HISTORY — DX: Chronic combined systolic (congestive) and diastolic (congestive) heart failure: I50.42

## 2020-06-20 HISTORY — DX: Obstructive sleep apnea (adult) (pediatric): G47.33

## 2020-06-20 HISTORY — DX: Chronic kidney disease, stage 4 (severe): N18.4

## 2020-06-20 HISTORY — DX: Hyperlipidemia, unspecified: E78.5

## 2020-06-20 LAB — TYPE AND SCREEN
ABO/RH(D): O POS
Antibody Screen: NEGATIVE

## 2020-06-20 LAB — CBC WITH DIFFERENTIAL/PLATELET
Abs Immature Granulocytes: 0.1 10*3/uL — ABNORMAL HIGH (ref 0.00–0.07)
Basophils Absolute: 0.1 10*3/uL (ref 0.0–0.1)
Basophils Relative: 0 %
Eosinophils Absolute: 0.6 10*3/uL — ABNORMAL HIGH (ref 0.0–0.5)
Eosinophils Relative: 4 %
HCT: 31.8 % — ABNORMAL LOW (ref 39.0–52.0)
Hemoglobin: 9.9 g/dL — ABNORMAL LOW (ref 13.0–17.0)
Immature Granulocytes: 1 %
Lymphocytes Relative: 27 %
Lymphs Abs: 4.3 10*3/uL — ABNORMAL HIGH (ref 0.7–4.0)
MCH: 30.3 pg (ref 26.0–34.0)
MCHC: 31.1 g/dL (ref 30.0–36.0)
MCV: 97.2 fL (ref 80.0–100.0)
Monocytes Absolute: 1.4 10*3/uL — ABNORMAL HIGH (ref 0.1–1.0)
Monocytes Relative: 9 %
Neutro Abs: 9.3 10*3/uL — ABNORMAL HIGH (ref 1.7–7.7)
Neutrophils Relative %: 59 %
Platelets: 367 10*3/uL (ref 150–400)
RBC: 3.27 MIL/uL — ABNORMAL LOW (ref 4.22–5.81)
RDW: 15.9 % — ABNORMAL HIGH (ref 11.5–15.5)
WBC: 15.7 10*3/uL — ABNORMAL HIGH (ref 4.0–10.5)
nRBC: 0.1 % (ref 0.0–0.2)

## 2020-06-20 LAB — CBC
HCT: 30.1 % — ABNORMAL LOW (ref 39.0–52.0)
Hemoglobin: 9.4 g/dL — ABNORMAL LOW (ref 13.0–17.0)
MCH: 29.7 pg (ref 26.0–34.0)
MCHC: 31.2 g/dL (ref 30.0–36.0)
MCV: 95 fL (ref 80.0–100.0)
Platelets: 334 10*3/uL (ref 150–400)
RBC: 3.17 MIL/uL — ABNORMAL LOW (ref 4.22–5.81)
RDW: 15.7 % — ABNORMAL HIGH (ref 11.5–15.5)
WBC: 15.4 10*3/uL — ABNORMAL HIGH (ref 4.0–10.5)
nRBC: 0 % (ref 0.0–0.2)

## 2020-06-20 LAB — RESP PANEL BY RT-PCR (FLU A&B, COVID) ARPGX2
Influenza A by PCR: NEGATIVE
Influenza B by PCR: NEGATIVE
SARS Coronavirus 2 by RT PCR: NEGATIVE

## 2020-06-20 LAB — COMPREHENSIVE METABOLIC PANEL
ALT: 18 U/L (ref 0–44)
AST: 31 U/L (ref 15–41)
Albumin: 2.6 g/dL — ABNORMAL LOW (ref 3.5–5.0)
Alkaline Phosphatase: 95 U/L (ref 38–126)
Anion gap: 9 (ref 5–15)
BUN: 100 mg/dL — ABNORMAL HIGH (ref 8–23)
CO2: 15 mmol/L — ABNORMAL LOW (ref 22–32)
Calcium: 8.3 mg/dL — ABNORMAL LOW (ref 8.9–10.3)
Chloride: 115 mmol/L — ABNORMAL HIGH (ref 98–111)
Creatinine, Ser: 3.22 mg/dL — ABNORMAL HIGH (ref 0.61–1.24)
GFR, Estimated: 18 mL/min — ABNORMAL LOW (ref >60)
Glucose, Bld: 99 mg/dL (ref 70–99)
Potassium: 5.7 mmol/L — ABNORMAL HIGH (ref 3.5–5.1)
Sodium: 139 mmol/L (ref 135–145)
Total Bilirubin: 0.6 mg/dL (ref 0.3–1.2)
Total Protein: 6.1 g/dL — ABNORMAL LOW (ref 6.5–8.1)

## 2020-06-20 MED ORDER — LACTATED RINGERS IV SOLN: INTRAVENOUS | Status: DC

## 2020-06-20 MED ORDER — SUCRALFATE 1 G PO TABS
1.0000 g | ORAL_TABLET | Freq: Three times a day (TID) | ORAL | Status: DC
  Administered 2020-06-20 – 2020-07-01 (×41): 1 g via ORAL
  Filled 2020-06-20 (×42): qty 1

## 2020-06-20 MED ORDER — ONDANSETRON HCL 4 MG/2ML IJ SOLN: 4.0000 mg | Freq: Four times a day (QID) | INTRAMUSCULAR | Status: DC | PRN

## 2020-06-20 MED ORDER — SODIUM ZIRCONIUM CYCLOSILICATE 10 G PO PACK
10.0000 g | PACK | Freq: Once | ORAL | Status: AC
  Administered 2020-06-20: 10 g via ORAL
  Filled 2020-06-20: qty 1

## 2020-06-20 MED ORDER — ONDANSETRON HCL 4 MG PO TABS: 4.0000 mg | ORAL_TABLET | Freq: Four times a day (QID) | ORAL | Status: DC | PRN

## 2020-06-20 MED ORDER — SIMVASTATIN 20 MG PO TABS
40.0000 mg | ORAL_TABLET | Freq: Every day | ORAL | Status: DC
  Administered 2020-06-20 – 2020-06-30 (×11): 40 mg via ORAL
  Filled 2020-06-20 (×11): qty 2

## 2020-06-20 MED ORDER — HYDRALAZINE HCL 20 MG/ML IJ SOLN: 5.0000 mg | INTRAMUSCULAR | Status: DC | PRN

## 2020-06-20 MED ORDER — PANTOPRAZOLE SODIUM 40 MG IV SOLR
40.0000 mg | Freq: Once | INTRAVENOUS | Status: AC
  Administered 2020-06-20: 40 mg via INTRAVENOUS
  Filled 2020-06-20: qty 40

## 2020-06-20 MED ORDER — TAMSULOSIN HCL 0.4 MG PO CAPS
0.4000 mg | ORAL_CAPSULE | Freq: Every day | ORAL | Status: DC
  Administered 2020-06-21 – 2020-06-30 (×10): 0.4 mg via ORAL
  Filled 2020-06-20 (×10): qty 1

## 2020-06-20 MED ORDER — LORATADINE 10 MG PO TABS
10.0000 mg | ORAL_TABLET | Freq: Every day | ORAL | Status: DC
  Administered 2020-06-21 – 2020-07-01 (×11): 10 mg via ORAL
  Filled 2020-06-20 (×11): qty 1

## 2020-06-20 MED ORDER — NIFEDIPINE ER OSMOTIC RELEASE 60 MG PO TB24
60.0000 mg | ORAL_TABLET | Freq: Every day | ORAL | Status: DC
  Administered 2020-06-20 – 2020-06-30 (×10): 60 mg via ORAL
  Filled 2020-06-20 (×12): qty 1

## 2020-06-20 MED ORDER — METOPROLOL SUCCINATE ER 25 MG PO TB24
50.0000 mg | ORAL_TABLET | Freq: Every day | ORAL | Status: DC
  Administered 2020-06-21 – 2020-07-01 (×10): 50 mg via ORAL
  Filled 2020-06-20 (×11): qty 2

## 2020-06-20 MED ORDER — ACETAMINOPHEN 650 MG RE SUPP: 650.0000 mg | Freq: Four times a day (QID) | RECTAL | Status: DC | PRN

## 2020-06-20 MED ORDER — ACETAMINOPHEN 325 MG PO TABS
650.0000 mg | ORAL_TABLET | Freq: Four times a day (QID) | ORAL | Status: DC | PRN
  Administered 2020-06-21: 650 mg via ORAL
  Filled 2020-06-20: qty 2

## 2020-06-20 MED ORDER — HYDRALAZINE HCL 50 MG PO TABS
50.0000 mg | ORAL_TABLET | Freq: Three times a day (TID) | ORAL | Status: DC
  Administered 2020-06-20 – 2020-07-01 (×30): 50 mg via ORAL
  Filled 2020-06-20 (×31): qty 1

## 2020-06-20 MED ORDER — PANTOPRAZOLE SODIUM 40 MG IV SOLR
40.0000 mg | Freq: Two times a day (BID) | INTRAVENOUS | Status: DC
  Administered 2020-06-20 – 2020-06-22 (×4): 40 mg via INTRAVENOUS
  Filled 2020-06-20 (×4): qty 40

## 2020-06-20 MED ORDER — LATANOPROST 0.005 % OP SOLN
1.0000 [drp] | Freq: Every day | OPHTHALMIC | Status: DC
  Administered 2020-06-20 – 2020-06-30 (×8): 1 [drp] via OPHTHALMIC
  Filled 2020-06-20 (×2): qty 2.5

## 2020-06-20 MED ORDER — LEVOTHYROXINE SODIUM 25 MCG PO TABS
25.0000 ug | ORAL_TABLET | Freq: Every day | ORAL | Status: DC
  Administered 2020-06-21 – 2020-07-01 (×11): 25 ug via ORAL
  Filled 2020-06-20 (×13): qty 1

## 2020-06-20 MED ORDER — ISOSORBIDE DINITRATE 30 MG PO TABS
30.0000 mg | ORAL_TABLET | Freq: Three times a day (TID) | ORAL | Status: DC
  Administered 2020-06-20 – 2020-07-01 (×31): 30 mg via ORAL
  Filled 2020-06-20 (×35): qty 1

## 2020-06-20 NOTE — Progress Notes (Signed)
Pt. Refused cpap. 

## 2020-06-20 NOTE — ED Notes (Signed)
Attempted report X1

## 2020-06-20 NOTE — Consult Note (Addendum)
Referring Provider: Dr. Karmen Bongo Primary Care Physician:  [name], DO Primary Gastroenterologist:  Dr. Cristina Gong [facility])  Reason for Consultation:  Melena, anemia  HPI: Brian Wolf is a 87 y.o. male with history of recurrent PUD, HTN, dementia, CVA (2015), CHF; OSA on CPAP, and stage 4 CKD presenting for consultation of anemia and melena.  Patient has dementia and thus most of the history is provided by wife at bedside.  Patient has been having black stools for the past 2 to 3 days.  Initially, they thought it was due to drinking Coca-Cola.  However, patient has not been drinking any Coca-Cola over the last day and had very black stools this morning.  He is denying any pain.  No known hematochezia.  Patient did have some diarrhea this past weekend and was seen in the ED.  He has endorsed some nausea but denies any vomiting.  Per his wife, his weight has remained stable, though he has had decreased intake for approximately 1 week.  Patient has also been more weak and fatigued over the past week.  Of note, patient ran out of Protonix approximately 1.5 weeks ago (he was on twice daily dosing until that point).  No ASA, NSAID, or blood thinner use.  EGD 04/2020: One non-bleeding cratered duodenal ulcer with no stigmata of bleeding was found in the second portion of the duodenum. The lesion was 10 mm in largest dimension. There was some edema of the second portion of the duodenum, but no other ulcers were seen, including careful inspection of the bulb. Biopsies were negative for H. pylori   Past Medical History:  Diagnosis Date  . CVA (cerebral infarction) 12/2013  . Dementia ([place])   . Gastric ulcer    prior to 2000  . Hypertension   . Sleep difficulties     Past Surgical History:  Procedure Laterality Date  . ABDOMINAL SURGERY    . BIOPSY  04/06/2020   Procedure: BIOPSY;  Surgeon: Ronald Lobo, MD;  Location: [place];  Service: Endoscopy;;  . CATARACT EXTRACTION  Left   . ESOPHAGOGASTRODUODENOSCOPY N/A 10/18/2019   Procedure: ESOPHAGOGASTRODUODENOSCOPY (EGD);  Surgeon: Wonda Horner, MD;  [facility] END[facility]COPY;  Service: Endoscopy;  Laterality: N/A;  . ESOPHAGOGASTRODUODENOSCOPY (EGD) WITH PROPOFOL N/A 04/06/2020   Procedure: ESOPHAGOGASTRODUODENOSCOPY (EGD) WITH PROPOFOL;  Surgeon: Ronald Lobo, MD;  Location: [place];  Service: Endoscopy;  Laterality: N/A;  . IR THORACENTESIS ASP PLEURAL SPACE W/IMG GUIDE  04/08/2020    Prior to Admission medications   Medication Sig Start Date End Date Taking? Authorizing Provider  Cholecalciferol 50 MCG (2000 UT) TABS Take 2,000 Units by mouth daily. 05/28/20   [provider]  fexofenadine (ALLEGRA) 180 MG tablet Take 180 mg by mouth in the morning.    [provider]  furosemide (LASIX) 40 MG tablet Take 1 tablet (40 mg total) by mouth 2 (two) times daily. 05/16/20 06/15/20  Amin, Jeanella Flattery, MD  hydrALAZINE (APRESOLINE) 50 MG tablet Take 1 tablet (50 mg total) by mouth 3 (three) times daily. 11/21/19   Cantwell, Celeste C, PA-C  isosorbide dinitrate (ISORDIL) 30 MG tablet Take 1 tablet (30 mg total) by mouth 3 (three) times daily. 09/03/19   Adrian Prows, MD  lactulose (CHRONULAC) 10 GM/15ML solution Take 15 mLs (10 g total) by mouth daily as needed for mild constipation. Patient not taking: Reported on 06/15/2020 04/18/20   Kathie Dike, MD  levothyroxine (SYNTHROID) 25 MCG tablet Take 25 mcg by mouth See  admin instructions. Take by mouth every morning before a meal 05/27/20   [provider]  metoprolol succinate (TOPROL-XL) 25 MG 24 hr tablet Take 1 tablet (25 mg total) by mouth daily. Patient not taking: Reported on 06/15/2020 04/19/20   Kathie Dike, MD  metoprolol succinate (TOPROL-XL) 50 MG 24 hr tablet Take 50 mg by mouth daily.    [provider]  mupirocin ointment (BACTROBAN) 2 % Apply 1 application topically daily as needed for irritation. 05/27/20   [provider]  NIFEdipine (ADALAT CC) 60 MG 24 hr tablet Take 60 mg by mouth at bedtime.    [provider]  ondansetron (ZOFRAN ODT) 4 MG disintegrating tablet Take 1 tablet (4 mg total) by mouth every 8 (eight) hours as needed for nausea or vomiting. 06/15/20   Margarita Mail, PA-C  pantoprazole (PROTONIX) 40 MG tablet Take 1 tablet (40 mg total) by mouth 2 (two) times daily before a meal. Patient not taking: Reported on 06/15/2020 10/22/19   Shawna Clamp, MD  potassium chloride SA (KLOR-CON) 20 MEQ tablet Take 1 tablet (20 mEq total) by mouth 2 (two) times daily. 05/16/20 06/15/20  Damita Lack, MD  Probiotic Product (PROBIOTIC DAILY PO) Take 1 capsule by mouth in the morning.    [provider]  simvastatin (ZOCOR) 40 MG tablet Take 40 mg by mouth at bedtime. 11/30/13   [provider]  tamsulosin (FLOMAX) 0.4 MG CAPS capsule Take 0.4 mg by mouth daily after supper. 06/03/16   [provider]  Travoprost, BAK Free, (TRAVATAN) 0.004 % SOLN ophthalmic solution Place 1 drop into both eyes every evening.    [provider]    Scheduled Meds: . pantoprazole (PROTONIX) IV  40 mg Intravenous Once  . sodium zirconium cyclosilicate  10 g Oral Once   Continuous Infusions: PRN Meds:.  Allergies as of 06/20/2020 - Review Complete 06/20/2020  Allergen Reaction Noted  . Lisinopril Cough 11/15/2002  . Nsaids Other (See Comments) 05/01/2020    Family History  Problem Relation Age of Onset  . Hyperlipidemia Mother   . Hypertension Mother   . Hypertension Father     Social History   Socioeconomic History  . Marital status: Married    Spouse name: [name]  . Number of children: 2  . Years of education: college  . Highest education level: Not on file  Occupational History    Comment: retired  Tobacco Use  . Smoking status: Never Smoker  . Smokeless tobacco: Never Used  Vaping Use  . Vaping Use: Never used  Substance and Sexual Activity  .  Alcohol use: No    Alcohol/week: 0.0 standard drinks  . Drug use: No  . Sexual activity: Not on file  Other Topics Concern  . Not on file  Social History Narrative   Patient lives at home with his wife [name].   Retired.   Education Retail buyer.   Right handed.   Caffeine two cups of coffee daily.   Social Determinants of Health   Financial Resource Strain: Not on file  Food Insecurity: Not on file  Transportation Needs: Not on file  Physical Activity: Not on file  Stress: Not on file  Social Connections: Not on file  Intimate Partner Violence: Not on file    Review of Systems: Review of Systems  Unable to perform ROS: Dementia   Physical Exam: Vital signs: Vitals:   06/20/20 1300 06/20/20 1400  BP: (!) 124/52 (!) 122/58  Pulse: 68  69  Resp:  17  Temp:    SpO2: 95% 96%     Physical Exam Vitals reviewed.  Constitutional:      General: He is not in acute distress. HENT:     Head: Normocephalic and atraumatic.     Nose: Nose normal. No congestion.     Mouth/Throat:     Mouth: Mucous membranes are moist.     Pharynx: Oropharynx is clear.  Eyes:     General: No scleral icterus.    Extraocular Movements: Extraocular movements intact.     Conjunctiva/sclera: Conjunctivae normal.  Cardiovascular:     Rate and Rhythm: Normal rate and regular rhythm.  Pulmonary:     Effort: Pulmonary effort is normal. No respiratory distress.  Abdominal:     General: Bowel sounds are normal. There is no distension.     Palpations: Abdomen is soft. There is no mass.     Tenderness: There is no abdominal tenderness. There is no guarding or rebound.     Hernia: No hernia is present.  Musculoskeletal:        General: No swelling or tenderness.     Cervical back: Normal range of motion and neck supple.  Skin:    General: Skin is warm and dry.  Neurological:     General: No focal deficit present.     Mental Status: Mental status is at baseline. He is lethargic.  Psychiatric:         Mood and Affect: Mood normal.        Behavior: Behavior normal. Behavior is cooperative.     GI:  Lab Results: Recent Labs    06/20/20 1110  WBC 15.7*  HGB 9.9*  HCT 31.8*  PLT 367   BMET Recent Labs    06/20/20 1110  NA 139  K 5.7*  CL 115*  CO2 15*  GLUCOSE 99  BUN 100*  CREATININE 3.22*  CALCIUM 8.3*   LFT Recent Labs    06/20/20 1110  PROT 6.1*  ALBUMIN 2.6*  AST 31  ALT 18  ALKPHOS 95  BILITOT 0.6   PT/INR No results for input(s): LABPROT, INR in the last 72 hours.   Studies/Results: No results found.  Impression: Melena, anemia: most consistent with rebleeding from known duodenal ulcer. Patient ran out of Protonix 1.5 weeks ago. -Hgb 9.9, decreased from 11.9 five days ago  CKD: BUN and Cr elevated above baseline -BUN 100/ Cr 3.22  CHF, EF 35 to 40% per echo 04/2020  Dementia, history of CVA  Plan: Start Protonix 40 mg IV BID. Start Carafate QID.  Clear liquid diet.  If no further decrease in Hgb or destabilizing bleeding, would recommend conservative management (PPI), as he had a scope in 04/2020 and has known recurrent PUD.  [facility] will follow.    LOS: 0 days   Salley Slaughter  PA-C 06/20/2020, 3:01 PM  Contact #  [phone]

## 2020-06-20 NOTE — H&P (Signed)
History and Physical    Brian Wolf IHW:[id number] DOB: 10-23-1931 DOA: 06/20/2020  PCP: Brian Morning, DO Consultants:  [facility] - Cardiology; [facility] - GI Patient coming from:  Home - lives with wife; NOK: Wife, Brian Wolf, [phone]; son, Brian Wolf (MD), [phone]  Chief Complaint: GI bleeding  HPI: Brian Wolf is a 87 y.o. male with medical history significant of HTN; HLD; dementia; CVA (2015); chronic combined CHF; OSA on CPAP; stage 4 CKD; and admission for UGI bleed in March (EGD with non-bleeding duodenal ulcer with no stigmata of bleeding) presenting with GI bleeding.  His wife noticed that his stool was black.  Came in Sunday with diarrhea for a couple of days, sent home.  His stools were dark yesterday.  This AM his stools were black and so she brought him in.  Her has been more weak than usual the last 2 days.  No abdominal pain  +nausea, no vomiting.    He was able to cook until March, none since.  Before March, unassisted ambulation; currently with walk and safety belt.   He was hospitalized for 2 weeks in March and refused rehab since.  He can feed himself and otherwise requires full assistance.      ED Course:  Rectal bleeding, maybe a few days.  Somewhat weaker, more tired for a week or two.  Reassuring labs a week ago.  Hgb 2 grams lower than 5 days ago.  Upper Gi bleeding in March,, seen by [facility].  Started Protonix.    Review of Systems: As per HPI; otherwise review of systems reviewed and negative.   Ambulatory Status:  Ambulates with a walker  COVID Vaccine Status:  Complete  Past Medical History:  Diagnosis Date  . Chronic combined systolic and diastolic CHF (congestive heart failure) ([place])   . CVA (cerebral infarction) 12/2013  . Dementia ( [place])   . Dyslipidemia   . Gastric ulcer    prior to 2000  . Hypertension   . OSA on CPAP   . Sleep difficulties   . Stage 4 chronic kidney disease [facility])     Past Surgical History:  Procedure Laterality  Date  . ABDOMINAL SURGERY    . BIOPSY  04/06/2020   Procedure: BIOPSY;  Surgeon: Ronald Lobo, MD;  Location: [facility];  Service: Endoscopy;;  . CATARACT EXTRACTION Left   . ESOPHAGOGASTRODUODENOSCOPY N/A 10/18/2019   Procedure: ESOPHAGOGASTRODUODENOSCOPY (EGD);  Surgeon: Wonda Horner, MD;  Location: [facility] ENDOSCOPY;  Service: Endoscopy;  Laterality: N/A;  . ESOPHAGOGASTRODUODENOSCOPY (EGD) WITH PROPOFOL N/A 04/06/2020   Procedure: ESOPHAGOGASTRODUODENOSCOPY (EGD) WITH PROPOFOL;  Surgeon: Ronald Lobo, MD;  Location: [facility];  Service: Endoscopy;  Laterality: N/A;  . IR THORACENTESIS ASP PLEURAL SPACE W/IMG GUIDE  04/08/2020    Social History   Socioeconomic History  . Marital status: Married    Spouse name: Brian Wolf  . Number of children: 2  . Years of education: college  . Highest education level: Not on file  Occupational History    Comment: retired  Tobacco Use  . Smoking status: Never Smoker  . Smokeless tobacco: Never Used  Vaping Use  . Vaping Use: Never used  Substance and Sexual Activity  . Alcohol use: No    Alcohol/week: 0.0 standard drinks  . Drug use: No  . Sexual activity: Not on file  Other Topics Concern  . Not on file  Social History Narrative   Patient lives at home with his wife Brian Wolf.   Retired.  Education Retail buyer.   Right handed.   Caffeine two cups of coffee daily.   Social Determinants of Health   Financial Resource Strain: Not on file  Food Insecurity: Not on file  Transportation Needs: Not on file  Physical Activity: Not on file  Stress: Not on file  Social Connections: Not on file  Intimate Partner Violence: Not on file    Allergies  Allergen Reactions  . Lisinopril Cough  . Nsaids Other (See Comments)    GI bleed(s)     Family History  Problem Relation Age of Onset  . Hyperlipidemia Mother   . Hypertension Mother   . Hypertension Father     Prior to Admission medications   Medication Sig Start Date End Date  Taking? Authorizing Provider  Cholecalciferol 50 MCG (2000 UT) TABS Take 2,000 Units by mouth daily. 05/28/20   [provider]  fexofenadine (ALLEGRA) 180 MG tablet Take 180 mg by mouth in the Wolf.    [provider]  furosemide (LASIX) 40 MG tablet Take 1 tablet (40 mg total) by mouth 2 (two) times daily. 05/16/20 06/15/20  Amin, Jeanella Flattery, MD  hydrALAZINE (APRESOLINE) 50 MG tablet Take 1 tablet (50 mg total) by mouth 3 (three) times daily. 11/21/19   Cantwell, Celeste C, PA-C  isosorbide dinitrate (ISORDIL) 30 MG tablet Take 1 tablet (30 mg total) by mouth 3 (three) times daily. 09/03/19   Adrian Prows, MD  lactulose (CHRONULAC) 10 GM/15ML solution Take 15 mLs (10 g total) by mouth daily as needed for mild constipation. Patient not taking: Reported on 06/15/2020 04/18/20   Kathie Dike, MD  levothyroxine (SYNTHROID) 25 MCG tablet Take 25 mcg by mouth See admin instructions. Take by mouth every Wolf before a meal 05/27/20   [provider]  metoprolol succinate (TOPROL-XL) 25 MG 24 hr tablet Take 1 tablet (25 mg total) by mouth daily. Patient not taking: Reported on 06/15/2020 04/19/20   Kathie Dike, MD  metoprolol succinate (TOPROL-XL) 50 MG 24 hr tablet Take 50 mg by mouth daily.    [provider]  mupirocin ointment (BACTROBAN) 2 % Apply 1 application topically daily as needed for irritation. 05/27/20   [provider]  NIFEdipine (ADALAT CC) 60 MG 24 hr tablet Take 60 mg by mouth at bedtime.    [provider]  ondansetron (ZOFRAN ODT) 4 MG disintegrating tablet Take 1 tablet (4 mg total) by mouth every 8 (eight) hours as needed for nausea or vomiting. 06/15/20   Margarita Mail, PA-C  pantoprazole (PROTONIX) 40 MG tablet Take 1 tablet (40 mg total) by mouth 2 (two) times daily before a meal. Patient not taking: Reported on 06/15/2020 10/22/19   Shawna Clamp, MD  potassium chloride SA (KLOR-CON) 20 MEQ tablet Take 1 tablet (20 mEq  total) by mouth 2 (two) times daily. 05/16/20 06/15/20  Damita Lack, MD  Probiotic Product (PROBIOTIC DAILY PO) Take 1 capsule by mouth in the Wolf.    [provider]  simvastatin (ZOCOR) 40 MG tablet Take 40 mg by mouth at bedtime. 11/30/13   [provider]  tamsulosin (FLOMAX) 0.4 MG CAPS capsule Take 0.4 mg by mouth daily after supper. 06/03/16   [provider]  Travoprost, BAK Free, (TRAVATAN) 0.004 % SOLN ophthalmic solution Place 1 drop into both eyes every evening.    [provider]    Physical Exam: Vitals:   06/20/20 1248 06/20/20 1300 06/20/20 1400 06/20/20 1418  BP: (!) 126/52 (!) 124/52 Marland Kitchen)  122/58   Pulse: 70 68 69   Resp: 16  17   Temp:      TempSrc:      SpO2: 96% 95% 96%   Weight:    68 kg  Height:    5\' 4"  (1.626 m)     . General:  Appears calm and comfortable and is in NAD . Eyes:  PERRL, EOMI, normal lids, iris . ENT:  grossly normal hearing, lips & tongue, mildly dry mm; poor dentition . Neck:  no LAD, masses or thyromegaly . Cardiovascular:  RRR, no m/r/g. No LE edema.  Marland Kitchen Respiratory:   CTA bilaterally with no wheezes/rales/rhonchi.  Normal respiratory effort. . Abdomen:  soft, NT, ND . Skin:  no rash or induration seen on limited exam . Musculoskeletal:  grossly normal tone BUE/BLE, good ROM, no bony abnormality . Psychiatric:  Blunted mood and affect, speech fluent and appropriate, AOx2, repeatedly asking the same questions . Neurologic:  CN 2-12 grossly intact, moves all extremities in coordinated fashion    Radiological Exams on Admission: Independently reviewed - see discussion in A/P where applicable  No results found.  EKG: Independently reviewed.  NSR with rate 71; nonspecific ST changes with no evidence of acute ischemia   Labs on Admission: I have personally reviewed the available labs and imaging studies at the time of the admission.  Pertinent labs:   K+ 5.7 CO2 15; 16 on 5/15 BUN  100/Creatinine 3.22/GFR 18; 70/2.59/23 on 5/15 - baseline GFR about 20 Albumin 2.6 WBC 15.7 Hgb 9.9; 11.9 on 5/15 but baseline appears to be around current value   Assessment/Plan Principal Problem:   Acute upper GI bleeding Active Problems:   Benign essential HTN   Obstructive sleep apnea syndrome   Hyperkalemia   CKD (chronic kidney disease), stage IV (HCC)   DNR (do not resuscitate)   Dementia ([place])   Chronic combined systolic and diastolic CHF (congestive heart failure) ([place])   Upper GI bleed -Patient is presenting with melena and nausea, suggestive of upper GI bleeding. -He was previously admitted for the same in March; duodenal ulcer appreciated on EGD. -Most likely diagnosis is gastric or duodenal ulcer, esophagitis or gastritis. -The patient is not tachycardic with normal blood pressure, suggesting subacute volume loss.  -Will observe on Med Surg -GI consulted by ED, will follow up recommendations -NPO after MN for possible EGD, clear liquids for now. -NS at 100 mL/hr -Start IV pantoprazole 40 BID for patients with ongoing bleeding who need urgent EGD -Zofran IV for nausea -Avoid NSAIDs and SQ heparin -Maintain IV access (2 large bore IVs if possible). -Type and screen were done in ED.  -Monitor closely and follow cbc q12h, transfuse as necessary for Hbg <8 with h/o cardiac disease  Stage IV CKD -Appears to be at/near usual baseline -Avoid nephrotoxic medications if possible -Recheck BMP in AM  Hyperkalemia -Likely associated with CKD (possibly with mild acute component) -Hold Lasix -No EKG changes concerning for hyperkalemia -Given Lokelma in ER -Recheck BMP in AM  HTN -Continue Toprol XL, Nifedipine, Isordil, hydralazine  HLD -Continue Zocor  Hypothyroidism -Continue Synthroid at current dose for now  Glaucoma -Continue Travatan  OSA -Continue CPAP  Dementia -Requires near total assistance since March -This is quite taxing on his wife -PT/OT  consults, ?need for placement  Chronic combined CHF -Echo -Hold Lasix  DNR -I have discussed code status with the patient's wife and reviewed his DNR ACP; the patient would not desire resuscitation and would  prefer to die a natural death should that situation arise.     Note: This patient has been tested and is negative for the novel coronavirus COVID-19. She has been fully vaccinated against COVID-19.     DVT prophylaxis: SCDs Code Status:  DNR - confirmed with family Family Communication: Wife was present throughout evaluation  Disposition Plan:  The patient is from: home  Anticipated d/c is to: home without [facility] services   Anticipated d/c date will depend on clinical response to treatment, but possibly as early as tomorrow if he has excellent response to treatment  Patient is currently: acutely ill Consults called: GI; PT/OT Admission status: It is my clinical opinion that referral for OBSERVATION is reasonable and necessary in this patient based on the above information provided. The aforementioned taken together are felt to place the patient at high risk for further clinical deterioration. However it is anticipated that the patient may be medically stable for discharge from the hospital within 24 to 48 hours.     Karmen Bongo MD [facility]   How to contact the [facility] Attending or Consulting provider [place] or covering provider during after hours [place], for this patient?  1. Check the care team in [facility] and look for a) attending/consulting [facility] provider listed and b) the [facility] team listed 2. Log into [email or web address] and use [place]'s universal password to access. If you do not have the password, please contact the hospital operator. 3. Locate the [facility] provider you are looking for under [facility] and page to a number that you can be directly reached. 4. If you still have difficulty reaching the provider, please page the [facility] (Director on Call) for the Hospitalists  listed on amion for assistance.   06/20/2020, 3:11 PM

## 2020-06-20 NOTE — ED Notes (Addendum)
Dinner tray ordered. Clear liquid

## 2020-06-20 NOTE — ED Triage Notes (Signed)
Patient arrives via EMS from home w complaints of blood in stool x 2days.  States it looks like coffee grounds.  Denies any abdominal pain. Also states family has noticed some weakness.

## 2020-06-20 NOTE — ED Provider Notes (Signed)
[facility] EMERGENCY DEPARTMENT Provider Note   CSN: [id number] Arrival date & time: 06/20/20  1100     History Chief Complaint  Patient presents with  . GI Problem  . GI Bleeding    Brian Wolf is a 87 y.o. male presenting for evaluation of black stools.  Level 5 caveat due to dementia.  Patient states he developed black stools either yesterday or today.  He reports it is only when having a bowel movement, no bleeding in between bowel movements.  He denies any weakness, lightheadedness, abdominal pain, chest pain, shortness of breath, nausea, vomiting, fevers.  However per EMS, patient's wife states patient is more lethargic and tired than normal.  He is not on any blood thinners.  He had something similar several months ago, was admitted for GI bleed at that time.  Per chart review, patient with a history of CVA, dementia, gastric ulcers, hypertension, A. fib, CHF, CKD.  He is not on any blood thinners.  HPI     Past Medical History:  Diagnosis Date  . CVA (cerebral infarction) 12/2013  . Dementia ([place])   . Gastric ulcer    prior to 2000  . Hypertension   . Sleep difficulties     Patient Active Problem List   Diagnosis Date Noted  . Acute upper GI bleeding 06/20/2020  . New onset atrial fibrillation ([place])   . CHF (congestive heart failure) ([place]) 05/13/2020  . Bronchitis with acute wheezing 05/13/2020  . Leukocytosis 05/13/2020  . Elevated troponin 05/13/2020  . CKD (chronic kidney disease), stage IV ([place]) 05/13/2020  . DNR (do not resuscitate) 05/13/2020  . Sensorineural hearing loss, bilateral 05/01/2020  . Hyperlipidemia 05/01/2020  . Benign neoplasm of colon 05/01/2020  . Benign prostatic hyperplasia 05/01/2020  . Cardiomyopathy ([place]) 05/01/2020  . Dyslipidemia 05/01/2020  . Glaucoma 05/01/2020  . History of anemia 05/01/2020  . Hypertensive retinopathy 05/01/2020  . Malignant hypertensive chronic kidney disease 05/01/2020  .  Personal history of other diseases of the digestive system 05/01/2020  . Sinus node dysfunction ([place]) 05/01/2020  . Malnutrition of moderate degree 04/14/2020  . SOB (shortness of breath)   . Bilateral lower extremity edema 04/05/2020  . Acute blood loss anemia 04/05/2020  . Pleural effusion 04/05/2020  . Acute diastolic CHF (congestive heart failure) ([place]) 04/05/2020  . Acute upper GI bleed 10/17/2019  . Hyperkalemia 10/17/2019  . Acute kidney injury superimposed on chronic kidney disease ([place]) 10/17/2019  . Fall   . Personal history of transient ischemic attack (TIA), and cerebral infarction without residual deficits 03/09/2016  . Obstructive sleep apnea syndrome 03/09/2016  . SAH (subarachnoid hemorrhage) ([place]) 03/09/2016  . Subarachnoid bleed ([place]) 03/08/2016  . Hypertension, essential   . Sleep difficulties   . Homonymous hemianopsia 01/14/2014  . Homonymous hemianopsia due to recent cerebral infarction 12/24/2013  . Left-sided neglect 12/24/2013  . Gout flare 12/20/2013  . Benign essential HTN 12/20/2013  . Acute hemorrhagic infarction of brain ([place]) 12/20/2013  . Hemorrhagic stroke ([place])   . Gout   . Hypokalemia 12/19/2013  . Altered mental status   . Encounter for immunization   . Encephalopathy acute   . ICH (intracerebral hemorrhage) ([place]) 12/14/2013  . Fever     Past Surgical History:  Procedure Laterality Date  . ABDOMINAL SURGERY    . BIOPSY  04/06/2020   Procedure: BIOPSY;  Surgeon: Ronald Lobo, MD;  Location: [facility];  Service: Endoscopy;;  . CATARACT EXTRACTION Left   .  ESOPHAGOGASTRODUODENOSCOPY N/A 10/18/2019   Procedure: ESOPHAGOGASTRODUODENOSCOPY (EGD);  Surgeon: Wonda Horner, MD;  Location: [facility] ENDOSCOPY;  Service: Endoscopy;  Laterality: N/A;  . ESOPHAGOGASTRODUODENOSCOPY (EGD) WITH PROPOFOL N/A 04/06/2020   Procedure: ESOPHAGOGASTRODUODENOSCOPY (EGD) WITH PROPOFOL;  Surgeon: Ronald Lobo, MD;  Location: [facility];  Service: Endoscopy;   Laterality: N/A;  . IR THORACENTESIS ASP PLEURAL SPACE W/IMG GUIDE  04/08/2020       Family History  Problem Relation Age of Onset  . Hyperlipidemia Mother   . Hypertension Mother   . Hypertension Father     Social History   Tobacco Use  . Smoking status: Never Smoker  . Smokeless tobacco: Never Used  Vaping Use  . Vaping Use: Never used  Substance Use Topics  . Alcohol use: No    Alcohol/week: 0.0 standard drinks  . Drug use: No    Home Medications Prior to Admission medications   Medication Sig Start Date End Date Taking? Authorizing Provider  Cholecalciferol 50 MCG (2000 UT) TABS Take 2,000 Units by mouth daily. 05/28/20   [provider]  fexofenadine (ALLEGRA) 180 MG tablet Take 180 mg by mouth in the morning.    [provider]  furosemide (LASIX) 40 MG tablet Take 1 tablet (40 mg total) by mouth 2 (two) times daily. 05/16/20 06/15/20  Amin, Jeanella Flattery, MD  hydrALAZINE (APRESOLINE) 50 MG tablet Take 1 tablet (50 mg total) by mouth 3 (three) times daily. 11/21/19   Cantwell, Celeste C, PA-C  isosorbide dinitrate (ISORDIL) 30 MG tablet Take 1 tablet (30 mg total) by mouth 3 (three) times daily. 09/03/19   Adrian Prows, MD  lactulose (CHRONULAC) 10 GM/15ML solution Take 15 mLs (10 g total) by mouth daily as needed for mild constipation. Patient not taking: Reported on 06/15/2020 04/18/20   Kathie Dike, MD  levothyroxine (SYNTHROID) 25 MCG tablet Take 25 mcg by mouth See admin instructions. Take by mouth every morning before a meal 05/27/20   [provider]  metoprolol succinate (TOPROL-XL) 25 MG 24 hr tablet Take 1 tablet (25 mg total) by mouth daily. Patient not taking: Reported on 06/15/2020 04/19/20   Kathie Dike, MD  metoprolol succinate (TOPROL-XL) 50 MG 24 hr tablet Take 50 mg by mouth daily.    [provider]  mupirocin ointment (BACTROBAN) 2 % Apply 1 application topically daily as needed for irritation. 05/27/20   [provider]  NIFEdipine (ADALAT CC) 60 MG 24 hr tablet Take 60 mg by mouth at bedtime.    [provider]  ondansetron (ZOFRAN ODT) 4 MG disintegrating tablet Take 1 tablet (4 mg total) by mouth every 8 (eight) hours as needed for nausea or vomiting. 06/15/20   Margarita Mail, PA-C  pantoprazole (PROTONIX) 40 MG tablet Take 1 tablet (40 mg total) by mouth 2 (two) times daily before a meal. Patient not taking: Reported on 06/15/2020 10/22/19   Shawna Clamp, MD  potassium chloride SA (KLOR-CON) 20 MEQ tablet Take 1 tablet (20 mEq total) by mouth 2 (two) times daily. 05/16/20 06/15/20  Damita Lack, MD  Probiotic Product (PROBIOTIC DAILY PO) Take 1 capsule by mouth in the morning.    [provider]  simvastatin (ZOCOR) 40 MG tablet Take 40 mg by mouth at bedtime. 11/30/13   [provider]  tamsulosin (FLOMAX) 0.4 MG CAPS capsule Take 0.4 mg by mouth daily after supper. 06/03/16   [provider]  Travoprost, BAK Free, (TRAVATAN) 0.004 % SOLN ophthalmic solution Place 1 drop  into both eyes every evening.    [provider]    Allergies    Lisinopril and Nsaids  Review of Systems   Review of Systems  Unable to perform ROS: Dementia  Gastrointestinal: Positive for blood in stool.    Physical Exam Updated Vital Signs BP (!) 122/58 (BP Location: Right Arm)   Pulse 69   Temp 97.9 F (36.6 C) (Oral)   Resp 17   Ht 5\' 4"  (1.626 m)   Wt 68 kg   SpO2 96%   BMI 25.75 kg/m   Physical Exam Vitals and nursing note reviewed. Exam conducted with a chaperone present.  Constitutional:      General: He is not in acute distress.    Appearance: He is well-developed. He is ill-appearing.     Comments: Appears pale  HENT:     Head: Normocephalic and atraumatic.  Eyes:     Extraocular Movements: Extraocular movements intact.     Conjunctiva/sclera: Conjunctivae normal.     Pupils: Pupils are equal, round, and reactive to light.  Cardiovascular:      Rate and Rhythm: Normal rate and regular rhythm.     Pulses: Normal pulses.  Pulmonary:     Effort: Pulmonary effort is normal. No respiratory distress.     Breath sounds: Normal breath sounds. No wheezing.  Abdominal:     General: There is no distension.     Palpations: Abdomen is soft. There is no mass.     Tenderness: There is no abdominal tenderness. There is no guarding or rebound.     Comments: No TTP of abdomen  Genitourinary:    Comments: Gross melena noted on exam. Musculoskeletal:        General: Normal range of motion.     Cervical back: Normal range of motion and neck supple.  Skin:    General: Skin is warm and dry.  Neurological:     Mental Status: He is alert and oriented to person, place, and time.     ED Results / Procedures / Treatments   Labs (all labs ordered are listed, but only abnormal results are displayed) Labs Reviewed  CBC WITH DIFFERENTIAL/PLATELET - Abnormal; Notable for the following components:      Result Value   WBC 15.7 (*)    RBC 3.27 (*)    Hemoglobin 9.9 (*)    HCT 31.8 (*)    RDW 15.9 (*)    Neutro Abs 9.3 (*)    Lymphs Abs 4.3 (*)    Monocytes Absolute 1.4 (*)    Eosinophils Absolute 0.6 (*)    Abs Immature Granulocytes 0.10 (*)    All other components within normal limits  COMPREHENSIVE METABOLIC PANEL - Abnormal; Notable for the following components:   Potassium 5.7 (*)    Chloride 115 (*)    CO2 15 (*)    BUN 100 (*)    Creatinine, Ser 3.22 (*)    Calcium 8.3 (*)    Total Protein 6.1 (*)    Albumin 2.6 (*)    GFR, Estimated 18 (*)    All other components within normal limits  RESP PANEL BY RT-PCR (FLU A&B, COVID) ARPGX2  POC OCCULT BLOOD, ED  TYPE AND SCREEN    EKG EKG Interpretation  Date/Time:  Friday Jun 20 2020 11:05:51 EDT Ventricular Rate:  71 PR Interval:  133 QRS Duration: 96 QT Interval:  368 QTC Calculation: 400 R Axis:   19 Text Interpretation: Sinus rhythm Atrial premature complexes  Inferior  infarct, old Anterior infarct, old Confirmed by Elnora Morrison [phone]) on 06/20/2020 1:47:41 PM   Radiology No results found.  Procedures Procedures   Medications Ordered in ED Medications  pantoprazole (PROTONIX) injection 40 mg (has no administration in time range)  sodium zirconium cyclosilicate (LOKELMA) packet 10 g (has no administration in time range)    ED Course  I have reviewed the triage vital signs and the nursing notes.  Pertinent labs & imaging results that were available during my care of the patient were reviewed by me and considered in my medical decision making (see chart for details).    MDM Rules/Calculators/A&P                          Patient presenting for evaluation of blood in stool.  On exam, patient appears pale and chronically ill.  GU exam concerning for melena.  In the setting of wife's report of increased weakness and black stools, patient will likely need to be admitted for recurring GI bleed.  Labs interpreted by me, shows hemoglobin of 9.9, which is a 2 g drop from 5 days ago. protonix started. Pt saw [facility] last admission in 04/2020. Mild, nonspecific leukocytosis of 15.7.  CMP shows worsening creatinine and signs of dehydration.  He has mildly hyperkalemia at 5.7, will tx with lokelma. No peaked T waves. Will call for admission.   Discussed with Dr. Lorin Mercy from [facility] service, pt to be admitted.   Final Clinical Impression(s) / ED Diagnoses Final diagnoses:  Acute GI bleeding  Elevated serum creatinine  Hyperkalemia  Dehydration    Rx / DC Orders ED Discharge Orders    None       Franchot Heidelberg, PA-C 06/20/20 1446    Elnora Morrison, MD 06/20/20 [phone]

## 2020-06-21 ENCOUNTER — Inpatient Hospital Stay (HOSPITAL_COMMUNITY): Payer: Medicare Other

## 2020-06-21 DIAGNOSIS — Z87898 Personal history of other specified conditions: Secondary | ICD-10-CM | POA: Diagnosis not present

## 2020-06-21 DIAGNOSIS — J9601 Acute respiratory failure with hypoxia: Secondary | ICD-10-CM | POA: Diagnosis present

## 2020-06-21 DIAGNOSIS — R0602 Shortness of breath: Secondary | ICD-10-CM | POA: Diagnosis not present

## 2020-06-21 DIAGNOSIS — E860 Dehydration: Secondary | ICD-10-CM | POA: Diagnosis present

## 2020-06-21 DIAGNOSIS — K922 Gastrointestinal hemorrhage, unspecified: Secondary | ICD-10-CM | POA: Diagnosis not present

## 2020-06-21 DIAGNOSIS — I10 Essential (primary) hypertension: Secondary | ICD-10-CM | POA: Diagnosis not present

## 2020-06-21 DIAGNOSIS — J9811 Atelectasis: Secondary | ICD-10-CM | POA: Diagnosis not present

## 2020-06-21 DIAGNOSIS — N401 Enlarged prostate with lower urinary tract symptoms: Secondary | ICD-10-CM | POA: Diagnosis present

## 2020-06-21 DIAGNOSIS — R600 Localized edema: Secondary | ICD-10-CM | POA: Diagnosis not present

## 2020-06-21 DIAGNOSIS — D509 Iron deficiency anemia, unspecified: Secondary | ICD-10-CM | POA: Diagnosis present

## 2020-06-21 DIAGNOSIS — N184 Chronic kidney disease, stage 4 (severe): Secondary | ICD-10-CM | POA: Diagnosis not present

## 2020-06-21 DIAGNOSIS — G4733 Obstructive sleep apnea (adult) (pediatric): Secondary | ICD-10-CM | POA: Diagnosis present

## 2020-06-21 DIAGNOSIS — K921 Melena: Secondary | ICD-10-CM | POA: Diagnosis not present

## 2020-06-21 DIAGNOSIS — J90 Pleural effusion, not elsewhere classified: Secondary | ICD-10-CM | POA: Diagnosis present

## 2020-06-21 DIAGNOSIS — N179 Acute kidney failure, unspecified: Secondary | ICD-10-CM | POA: Diagnosis present

## 2020-06-21 DIAGNOSIS — R338 Other retention of urine: Secondary | ICD-10-CM | POA: Diagnosis present

## 2020-06-21 DIAGNOSIS — I517 Cardiomegaly: Secondary | ICD-10-CM | POA: Diagnosis not present

## 2020-06-21 DIAGNOSIS — Z1621 Resistance to vancomycin: Secondary | ICD-10-CM | POA: Diagnosis present

## 2020-06-21 DIAGNOSIS — I130 Hypertensive heart and chronic kidney disease with heart failure and stage 1 through stage 4 chronic kidney disease, or unspecified chronic kidney disease: Secondary | ICD-10-CM | POA: Diagnosis present

## 2020-06-21 DIAGNOSIS — E039 Hypothyroidism, unspecified: Secondary | ICD-10-CM | POA: Diagnosis present

## 2020-06-21 DIAGNOSIS — Z66 Do not resuscitate: Secondary | ICD-10-CM | POA: Diagnosis not present

## 2020-06-21 DIAGNOSIS — H409 Unspecified glaucoma: Secondary | ICD-10-CM | POA: Diagnosis present

## 2020-06-21 DIAGNOSIS — S2231XA Fracture of one rib, right side, initial encounter for closed fracture: Secondary | ICD-10-CM | POA: Diagnosis not present

## 2020-06-21 DIAGNOSIS — F0390 Unspecified dementia without behavioral disturbance: Secondary | ICD-10-CM | POA: Diagnosis present

## 2020-06-21 DIAGNOSIS — J811 Chronic pulmonary edema: Secondary | ICD-10-CM | POA: Diagnosis not present

## 2020-06-21 DIAGNOSIS — D62 Acute posthemorrhagic anemia: Secondary | ICD-10-CM | POA: Diagnosis not present

## 2020-06-21 DIAGNOSIS — Z7189 Other specified counseling: Secondary | ICD-10-CM | POA: Diagnosis not present

## 2020-06-21 DIAGNOSIS — E785 Hyperlipidemia, unspecified: Secondary | ICD-10-CM | POA: Diagnosis present

## 2020-06-21 DIAGNOSIS — D72829 Elevated white blood cell count, unspecified: Secondary | ICD-10-CM | POA: Diagnosis present

## 2020-06-21 DIAGNOSIS — E875 Hyperkalemia: Secondary | ICD-10-CM | POA: Diagnosis present

## 2020-06-21 DIAGNOSIS — K264 Chronic or unspecified duodenal ulcer with hemorrhage: Secondary | ICD-10-CM | POA: Diagnosis present

## 2020-06-21 DIAGNOSIS — I610 Nontraumatic intracerebral hemorrhage in hemisphere, subcortical: Secondary | ICD-10-CM | POA: Diagnosis not present

## 2020-06-21 DIAGNOSIS — B952 Enterococcus as the cause of diseases classified elsewhere: Secondary | ICD-10-CM | POA: Diagnosis present

## 2020-06-21 DIAGNOSIS — Z515 Encounter for palliative care: Secondary | ICD-10-CM | POA: Diagnosis not present

## 2020-06-21 DIAGNOSIS — I5042 Chronic combined systolic (congestive) and diastolic (congestive) heart failure: Secondary | ICD-10-CM | POA: Diagnosis not present

## 2020-06-21 DIAGNOSIS — Z8701 Personal history of pneumonia (recurrent): Secondary | ICD-10-CM | POA: Diagnosis not present

## 2020-06-21 DIAGNOSIS — K269 Duodenal ulcer, unspecified as acute or chronic, without hemorrhage or perforation: Secondary | ICD-10-CM | POA: Diagnosis not present

## 2020-06-21 DIAGNOSIS — R8271 Bacteriuria: Secondary | ICD-10-CM | POA: Diagnosis present

## 2020-06-21 DIAGNOSIS — Z20822 Contact with and (suspected) exposure to covid-19: Secondary | ICD-10-CM | POA: Diagnosis present

## 2020-06-21 LAB — CBC
HCT: 26.9 % — ABNORMAL LOW (ref 39.0–52.0)
Hemoglobin: 8.5 g/dL — ABNORMAL LOW (ref 13.0–17.0)
MCH: 30 pg (ref 26.0–34.0)
MCHC: 31.6 g/dL (ref 30.0–36.0)
MCV: 95.1 fL (ref 80.0–100.0)
Platelets: 304 10*3/uL (ref 150–400)
RBC: 2.83 MIL/uL — ABNORMAL LOW (ref 4.22–5.81)
RDW: 15.7 % — ABNORMAL HIGH (ref 11.5–15.5)
WBC: 13.8 10*3/uL — ABNORMAL HIGH (ref 4.0–10.5)
nRBC: 0 % (ref 0.0–0.2)

## 2020-06-21 LAB — BASIC METABOLIC PANEL
Anion gap: 8 (ref 5–15)
BUN: 91 mg/dL — ABNORMAL HIGH (ref 8–23)
CO2: 16 mmol/L — ABNORMAL LOW (ref 22–32)
Calcium: 8 mg/dL — ABNORMAL LOW (ref 8.9–10.3)
Chloride: 114 mmol/L — ABNORMAL HIGH (ref 98–111)
Creatinine, Ser: 2.75 mg/dL — ABNORMAL HIGH (ref 0.61–1.24)
GFR, Estimated: 22 mL/min — ABNORMAL LOW (ref >60)
Glucose, Bld: 106 mg/dL — ABNORMAL HIGH (ref 70–99)
Potassium: 4.7 mmol/L (ref 3.5–5.1)
Sodium: 138 mmol/L (ref 135–145)

## 2020-06-21 MED ORDER — IPRATROPIUM-ALBUTEROL 0.5-2.5 (3) MG/3ML IN SOLN
3.0000 mL | Freq: Four times a day (QID) | RESPIRATORY_TRACT | Status: AC | PRN
Start: 2020-06-21 — End: 2020-06-22
  Administered 2020-06-22 (×2): 3 mL via RESPIRATORY_TRACT
  Filled 2020-06-21 (×2): qty 3

## 2020-06-21 NOTE — Progress Notes (Signed)
Subjective: Melena resolving.  Objective: Vital signs in last 24 hours: Temp:  [97.6 F (36.4 C)-98 F (36.7 C)] 98 F (36.7 C) (05/21 0454) Pulse Rate:  [68-80] 74 (05/21 0454) Resp:  [16-17] 17 (05/21 0454) BP: (117-142)/(47-95) 123/47 (05/21 0454) SpO2:  [95 %-97 %] 97 % (05/21 0454) Weight:  [68 kg] 68 kg (05/20 1418) Weight change:  Last BM Date: 06/20/20  PE: GEN:  NAD SKIN:  Pale  Lab Results: CBC    Component Value Date/Time   WBC 13.8 (H) 06/21/2020 0112   RBC 2.83 (L) 06/21/2020 0112   HGB 8.5 (L) 06/21/2020 0112   HCT 26.9 (L) 06/21/2020 0112   PLT 304 06/21/2020 0112   MCV 95.1 06/21/2020 0112   MCH 30.0 06/21/2020 0112   MCHC 31.6 06/21/2020 0112   RDW 15.7 (H) 06/21/2020 0112   LYMPHSABS 4.3 (H) 06/20/2020 1110   MONOABS 1.4 (H) 06/20/2020 1110   EOSABS 0.6 (H) 06/20/2020 1110   BASOSABS 0.1 06/20/2020 1110   CMP     Component Value Date/Time   NA 138 06/21/2020 0112   K 4.7 06/21/2020 0112   CL 114 (H) 06/21/2020 0112   CO2 16 (L) 06/21/2020 0112   GLUCOSE 106 (H) 06/21/2020 0112   BUN 91 (H) 06/21/2020 0112   CREATININE 2.75 (H) 06/21/2020 0112   CALCIUM 8.0 (L) 06/21/2020 0112   PROT 6.1 (L) 06/20/2020 1110   ALBUMIN 2.6 (L) 06/20/2020 1110   AST 31 06/20/2020 1110   ALT 18 06/20/2020 1110   ALKPHOS 95 06/20/2020 1110   BILITOT 0.6 06/20/2020 1110   GFRNONAA 22 (L) 06/21/2020 0112   GFRAA 39 (L) 10/22/2019 0122    Assessment:  1.  Acute blood loss anemia. 2.  Blood in stool, improving. 3.  History of duodenal ulcer.  Plan:  1.  Pantoprazole 40 mg po bid x 6 weeks, then 40 mg po qd indefinitely thereafter. 2.  No endoscopy in absence of recurrent destabilizing bleeding. 3.  Advance diet as tolerated. 4.  [facility] will sign-off; please call back with any further questions; can follow-up with [facility] as needed as outpatient; thank you for the consult. 5.  Case discussed with Dr. Thereasa Solo of [facility]   Calob Baskette  M 06/21/2020, 12:30 PM   Cell [phone] If no answer or after 5 PM call [phone]

## 2020-06-21 NOTE — Progress Notes (Signed)
Pt called RN to room asking for assistance sitting up in bed. Pt noted to be tachypneic R=30s and shallow with audible expiratory wheezing, use of accessory muscles. Lung sounds = diminished bilaterally. Pt restless and anxious. Pt repositioned, VS checked and stable. O2=97%RA. No edema noted. Pulses stronger than earlier assessment in all extremities. Pt more comfortable after repositioning. Hospitalist paged.

## 2020-06-21 NOTE — Progress Notes (Signed)
Occupational Therapy Evaluation Patient Details Name: Brian Wolf MRN: [id number] DOB: December 04, 1931 Today's Date: 06/21/2020    History of Present Illness Per Dr. Lorin Mercy H&P on 5/20- "Brian Wolf is a 87 y.o. male with medical history significant of HTN; HLD; dementia; CVA (2015); chronic combined CHF; OSA on CPAP; stage 4 CKD; and admission for UGI bleed in March (EGD with non-bleeding duodenal ulcer with no stigmata of bleeding) presenting with GI bleeding.  His wife noticed that his stool was black. Came in Sunday with diarrhea for a couple of days, sent home. His stools were dark yesterday. This AM his stools were black and so she brought him in. Her has been more weak than usual the last 2 days. No abdominal pain, +nausea, no vomiting. He was able to cook until March, none since.  Before March, unassisted ambulation; currently with walk and safety belt. He was hospitalized for 2 weeks in March and refused rehab since. He can feed himself and otherwise requires full assistance."   Clinical Impression   PTA, pt was living with his wife in a 1-level house with ramp access. Since March, pt has required increased supervision/assistance with functional ADLs/ADL mobility. Pt primarily uses a RW for short distances within the house. Wife provided increased assistance with bathing, dressing, toileting, functional mobility, and transfers. Pt was mod I-supervision with self-feeding and grooming. Pt depended on his wife for all IADLs. Wife reports 1 fall but no injury.  Today, pt received semi-reclined in bed, wife present, pt agreeable to OT eval. Pt presents with intermittent confusion, fatigue, weakness, and low activity tolerance. Pt required cognitive reorientation- reason for admission and date. Currently, pt requires min guard for bed mobility, min assist for sit>stand from EOB using RW, min assist for ambulation from bed>toilet>sink>chair using RW, mod-min assist for LB self-care, and min  assist-supervision for UB self-care. Pt benefits from increased verbal/tactile/visual cues for accurate sequencing and safe RW management while participating in ADLs. Wife is supportive and knowledgeable. Educated pt/wife on safety awareness, PLB, activity pacing, d/c planning, and adaptive ADL strategies. Wife can provide 24/7 S/A as needed and prefers [facility] therapy instead of rehab placement. OT will continue to follow pt acutely as able.    Follow Up Recommendations  Other (comment) (Anticipate progression to home with STRICT 24/7 S/A and HHOT)    Equipment Recommendations  Other (comment) (wife expresses need for a wheelchair to transport pt in the community- refer to PT for mobility device recommendation)    Recommendations for Other Services Other (comment) (None)     Precautions / Restrictions Precautions Precautions: Fall Restrictions Weight Bearing Restrictions: No      Mobility Bed Mobility Overal bed mobility: Needs Assistance Bed Mobility: Rolling;Sidelying to Sit Rolling: Supervision Sidelying to sit: Min guard       General bed mobility comments: verbal cues for log rolling and reaching across body for bed railing    Transfers Overall transfer level: Needs assistance Equipment used: Rolling walker (2 wheeled) Transfers: Sit to/from Stand Sit to Stand: Min assist         General transfer comment: min assist using RW for sit>stand from EOB    Balance Overall balance assessment: History of Falls      ADL either performed or assessed with clinical judgement   ADL Overall ADL's : Needs assistance/impaired Eating/Feeding: Modified independent;Set up;Sitting   Grooming: Wash/dry hands;Minimal assistance;Min guard;Cueing for safety;Cueing for sequencing;Standing Grooming Details (indicate cue type and reason): stood at sink to wash hands  with min assist-min guard using RW Upper Body Bathing: Minimal assistance;Sitting   Lower Body Bathing: Moderate  assistance;Sitting/lateral leans;Sit to/from stand   Upper Body Dressing : Minimal assistance;Sitting   Lower Body Dressing: Moderate assistance;Sitting/lateral leans;Sit to/from stand Lower Body Dressing Details (indicate cue type and reason): mod assist for donning shoes sitting EOB Toilet Transfer: Minimal assistance;Cueing for safety;Cueing for sequencing;Comfort height toilet;Grab bars;RW Toilet Transfer Details (indicate cue type and reason): increased cues for sequencing functional task and safely managing RW Toileting- Clothing Manipulation and Hygiene: Moderate assistance;Sitting/lateral lean;Sit to/from stand   Tub/ Banker:  (not assessed today)   Functional mobility during ADLs: Minimal assistance;Cueing for safety;Cueing for sequencing;Rolling walker General ADL Comments: min assist using RW and increased verbal cues for safely sequencing functional activities     Vision Baseline Vision/History: No visual deficits Patient Visual Report: No change from baseline Vision Assessment?: No apparent visual deficits     Perception Perception Perception Tested?: No   Praxis Praxis Praxis tested?: Not tested    Pertinent Vitals/Pain Pain Assessment: No/denies pain     Hand Dominance Right   Extremity/Trunk Assessment Upper Extremity Assessment Upper Extremity Assessment: Overall WFL for tasks assessed   Lower Extremity Assessment Lower Extremity Assessment: Overall WFL for tasks assessed   Cervical / Trunk Assessment Cervical / Trunk Assessment: Normal   Communication Communication Communication: No difficulties   Cognition Arousal/Alertness: Awake/alert Behavior During Therapy: Flat affect Overall Cognitive Status: History of cognitive impairments - at baseline     General Comments: wife reports that pt is typically oriented to at least person, place, DOB but needs cues for date   General Comments  skin intact, no edema, requires increased cues for  safety            Home Living Family/patient expects to be discharged to:: Private residence Living Arrangements: Spouse/significant other Available Help at Discharge: Family;Available 24 hours/day Type of Home: House Home Access: Ramped entrance     Home Layout: One level     Bathroom Shower/Tub: Teacher, early years/pre: Handicapped height Bathroom Accessibility: Yes   Home Equipment: Environmental consultant - 2 wheels;Walker - 4 wheels;Cane - single point;Grab bars - toilet;Grab bars - tub/shower;Tub bench   Additional Comments: Wife provides 24/7 supervision/support for safety      Prior Functioning/Environment Level of Independence: Needs assistance  Gait / Transfers Assistance Needed: since March, pt has required increased assistance with functional mobility using a RW for support, pt has been more sedentary lately according to his wife ADL's / Homemaking Assistance Needed: Pt has required increased assistance with toileting/bathing/dressing lately, pt is able to self-feed and groom Communication / Swallowing Assistance Needed: no difficulties Comments: was setup with home health therapy recently        OT Problem List: Decreased strength;Decreased activity tolerance;Impaired balance (sitting and/or standing);Decreased cognition;Decreased safety awareness;Decreased knowledge of use of DME or AE      OT Treatment/Interventions: Self-care/ADL training;Therapeutic exercise;Neuromuscular education;Energy conservation;DME and/or AE instruction;Therapeutic activities;Cognitive remediation/compensation;Patient/family education    OT Goals(Current goals can be found in the care plan section) Acute Rehab OT Goals Patient Stated Goal: return home with home health therapy OT Goal Formulation: With patient/family Time For Goal Achievement: 07/05/20 Potential to Achieve Goals: Good  OT Frequency: Min 2X/week    AM-PAC OT "6 Clicks" Daily Activity     Outcome Measure Help from  another person eating meals?: A Little Help from another person taking care of personal grooming?: A Little Help from another person  toileting, which includes using toliet, bedpan, or urinal?: A Lot Help from another person bathing (including washing, rinsing, drying)?: A Lot Help from another person to put on and taking off regular upper body clothing?: A Little Help from another person to put on and taking off regular lower body clothing?: A Lot 6 Click Score: 15   End of Session Equipment Utilized During Treatment: Gait belt;Rolling walker Nurse Communication: Mobility status  Activity Tolerance: Patient tolerated treatment well Patient left: in chair;with family/visitor present;Other (comment) (nursing aware)  OT Visit Diagnosis: Unsteadiness on feet (R26.81);Muscle weakness (generalized) (M62.81);History of falling (Z91.81)                Time: 1751-0258 OT Time Calculation (min): 46 min Charges:  OT General Charges $OT Visit: 1 Visit OT Evaluation $OT Eval Moderate Complexity: 1 Mod OT Treatments $Self Care/Home Management : 23-37 mins  Michel Bickers, OTR/L [facility] 06/21/2020, 1:03 PM

## 2020-06-21 NOTE — Evaluation (Signed)
Physical Therapy Evaluation Patient Details Name: Brian Wolf MRN: [id number] DOB: 1931/09/14 Today's Date: 06/21/2020   History of Present Illness  87 y.o. male with medical history significant of HTN; HLD; dementia; CVA (2015); chronic combined CHF; OSA on CPAP; stage 4 CKD; and admission for UGI bleed in March (EGD with non-bleeding duodenal ulcer with no stigmata of bleeding) presenting with GI bleeding.    Clinical Impression  Pt admitted with above diagnosis. PTA pt lived at home with his wife. At baseline, he ambulates with RW and wife assists with ADLs. Pt has had a steady mobility decline over past several months. On eval, he required min assist bed mobility, min assist transfers, and min guard assist ambulation 20' with RW. Mobility/gait distance limited by fatigue. Pt currently with functional limitations due to the deficits listed below (see PT Problem List). Pt will benefit from skilled PT to increase their independence and safety with mobility to allow discharge to the venue listed below. Recommend wheelchair for mobility longer distances. Pt has a very long ramp to enter/exit home and he is unable to ambulate that distance at this time.       Follow Up Recommendations Home health PT;Supervision/Assistance - 24 hour    Equipment Recommendations  Wheelchair (measurements PT)    Recommendations for Other Services       Precautions / Restrictions Precautions Precautions: Fall Restrictions Weight Bearing Restrictions: No      Mobility  Bed Mobility Overal bed mobility: Needs Assistance Bed Mobility: Sit to Supine       Sit to supine: Min assist   General bed mobility comments: assist with BLE back to bed    Transfers Overall transfer level: Needs assistance Equipment used: Rolling walker (2 wheeled) Transfers: Sit to/from Stand Sit to Stand: Min assist         General transfer comment: assist to power up  Ambulation/Gait Ambulation/Gait assistance:  Min guard Gait Distance (Feet): 20 Feet Assistive device: Rolling walker (2 wheeled) Gait Pattern/deviations: Step-through pattern;Decreased stride length Gait velocity: decreased Gait velocity interpretation: <1.31 ft/sec, indicative of household ambulator General Gait Details: min guard for safety, distance limited by fatigue  Stairs            Wheelchair Mobility    Modified Rankin (Stroke Patients Only)       Balance Overall balance assessment: Needs assistance Sitting-balance support: No upper extremity supported;Feet supported Sitting balance-[name]: Good     Standing balance support: Bilateral upper extremity supported;During functional activity Standing balance-[name]: Poor Standing balance comment: reliant on RW                             Pertinent Vitals/Pain Pain Assessment: No/denies pain    Home Living Family/patient expects to be discharged to:: Private residence Living Arrangements: Spouse/significant other Available Help at Discharge: Family;Available 24 hours/day Type of Home: House Home Access: Ramped entrance     Home Layout: One level Home Equipment: Walker - 2 wheels;Walker - 4 wheels;Cane - single point;Grab bars - toilet;Grab bars - tub/shower;Tub bench      Prior Function Level of Independence: Needs assistance   Gait / Transfers Assistance Needed: since March, pt has required increased assistance with functional mobility using a RW for support, pt has been more sedentary lately according to his wife  ADL's / Homemaking Assistance Needed: Pt has required increased assistance with toileting/bathing/dressing lately, pt is able to self-feed and groom  Hand Dominance   Dominant Hand: Right    Extremity/Trunk Assessment   Upper Extremity Assessment Upper Extremity Assessment: Overall WFL for tasks assessed    Lower Extremity Assessment Lower Extremity Assessment: Generalized weakness    Cervical /  Trunk Assessment Cervical / Trunk Assessment: Normal  Communication   Communication: No difficulties  Cognition Arousal/Alertness: Awake/alert Behavior During Therapy: Flat affect Overall Cognitive Status: History of cognitive impairments - at baseline                                 General Comments: wife reports that pt is typically oriented to at least person, place, DOB but needs cues for date      General Comments      Exercises     Assessment/Plan    PT Assessment Patient needs continued PT services  PT Problem List Decreased strength;Decreased mobility;Decreased activity tolerance;Decreased balance       PT Treatment Interventions DME instruction;Therapeutic activities;Gait training;Therapeutic exercise;Patient/family education;Balance training;Functional mobility training    PT Goals (Current goals can be found in the Care Plan section)  Acute Rehab PT Goals Patient Stated Goal: return home with home health therapy PT Goal Formulation: With patient/family Time For Goal Achievement: 07/05/20 Potential to Achieve Goals: Good    Frequency Min 3X/week   Barriers to discharge        Co-evaluation               AM-PAC PT "6 Clicks" Mobility  Outcome Measure Help needed turning from your back to your side while in a flat bed without using bedrails?: A Little Help needed moving from lying on your back to sitting on the side of a flat bed without using bedrails?: A Little Help needed moving to and from a bed to a chair (including a wheelchair)?: A Little Help needed standing up from a chair using your arms (e.g., wheelchair or bedside chair)?: A Little Help needed to walk in hospital room?: A Little Help needed climbing 3-5 steps with a railing? : A Lot 6 Click Score: 17    End of Session Equipment Utilized During Treatment: Gait belt Activity Tolerance: Patient tolerated treatment well Patient left: in bed;with call bell/phone within  reach;with family/visitor present;with bed alarm set Nurse Communication: Mobility status PT Visit Diagnosis: Difficulty in walking, not elsewhere classified (R26.2);Muscle weakness (generalized) (M62.81)    Time: 0459-9774 PT Time Calculation (min) (ACUTE ONLY): 18 min   Charges:   PT Evaluation $PT Eval Moderate Complexity: 1 Mod          Lorrin Goodell, PT  Office # [phone] Pager [phone]   Lorriane Shire 06/21/2020, 3:01 PM

## 2020-06-21 NOTE — Progress Notes (Signed)
Brian Wolf  QZE:[id number] DOB: Jun 18, 1931 DOA: 06/20/2020 PCP: [name], DO    Brief Narrative:  87 year old with a history of HTN, HLD, dementia, thromboembolic CVA, chronic combined systolic and diastolic CHF, OSA on CPAP, CKD stage IV, and upper GI bleed requiring admission in March due to a duodenal ulcer who returned to the ED with black stools x1 week with no abdominal pain but with nausea.  In the ED it was appreciated that his hemoglobin had dropped 2 g over a 5-day period.   Consultants:  Gastroenterology  Code Status: NO CODE BLUE  Antimicrobials:  None  DVT prophylaxis: SCDs  Subjective: Afebrile.  Vital signs stable.  Resting comfortably in a bedside chair.  Has no new complaints at this time.  Assessment & Plan:  Upper GI bleed /melena Admitted March 2022 with duodenal ulcer -has had 2 endoscopies within the past year with both showing a clean-based duodenal ulcer -further history reveals patient had not been taking his Protonix for approximately 10 days -no plans for repeat endoscopy presently -PPI resumed -advance diet in a.m. if stable overnight  Acute blood loss anemia Hemoglobin has further declined with hydration as would be expected -follow to nadir -does not yet require transfusion  Recent Labs  Lab 06/15/20 1021 06/20/20 1110 06/20/20 1935 06/21/20 0112  HGB 11.9* 9.9* 9.4* 8.5*    CKD stage IV Creatinine stable near his baseline -monitor trend  Recent Labs  Lab 06/15/20 1021 06/20/20 1110 06/21/20 0112  CREATININE 2.59* 3.22* 2.75*    Chronic combined systolic and diastolic CHF Well compensated at present  Hyperkalemia Corrected with volume support  HTN Blood pressure currently well controlled  Hypothyroidism Continue usual home medical therapy  Dementia Has required near total assistance since March 2022  HLD  Glaucoma Continue usual home eyedrops    Family Communication: Spoke with wife at bedside at  length Status is: Inpatient  Remains inpatient appropriate because:Inpatient level of care appropriate due to severity of illness   Dispo: The patient is from: Home              Anticipated d/c is to: unclear              Patient currently is not medically stable to d/c.   Difficult to place patient No   Objective: Blood pressure (!) 123/47, pulse 74, temperature 98 F (36.7 C), resp. rate 17, height 5\' 4"  (1.626 m), weight 68 kg, SpO2 97 %.  Intake/Output Summary (Last 24 hours) at 06/21/2020 0910 Last data filed at 06/21/2020 0837 Gross per 24 hour  Intake --  Output 670 ml  Net -670 ml   Filed Weights   06/20/20 1418  Weight: 68 kg    Examination: General: No acute respiratory distress Lungs: Clear to auscultation bilaterally without wheezes or crackles Cardiovascular: Regular rate and rhythm without murmur gallop or rub normal S1 and S2 Abdomen: Nontender, nondistended, soft, bowel sounds positive, no rebound, no ascites, no appreciable mass Extremities: No significant cyanosis, clubbing, or edema bilateral lower extremities  CBC: Recent Labs  Lab 06/20/20 1110 06/20/20 1935 06/21/20 0112  WBC 15.7* 15.4* 13.8*  NEUTROABS 9.3*  --   --   HGB 9.9* 9.4* 8.5*  HCT 31.8* 30.1* 26.9*  MCV 97.2 95.0 95.1  PLT 367 334 226   Basic Metabolic Panel: Recent Labs  Lab 06/15/20 1021 06/20/20 1110 06/21/20 0112  NA 138 139 138  K 5.1 5.7* 4.7  CL 112* 115* 114*  CO2  16* 15* 16*  GLUCOSE 105* 99 106*  BUN 70* 100* 91*  CREATININE 2.59* 3.22* 2.75*  CALCIUM 8.5* 8.3* 8.0*   GFR: Estimated Creatinine Clearance: 16.1 mL/min (A) (by C-G formula based on SCr of 2.75 mg/dL (H)).  Liver Function Tests: Recent Labs  Lab 06/15/20 1021 06/20/20 1110  AST 43* 31  ALT 27 18  ALKPHOS 113 95  BILITOT 0.7 0.6  PROT 6.4* 6.1*  ALBUMIN 2.8* 2.6*   Recent Labs  Lab 06/15/20 1021  LIPASE 43    HbA1C: Hgb A1c MFr Bld  Date/Time Value Ref Range Status  12/15/2013  03:42 AM 6.4 (H) <5.7 % Final    Comment:    (NOTE)                                                                       According to the ADA Clinical Practice Recommendations for 2011, when HbA1c is used as a screening test:  >=6.5%   Diagnostic of Diabetes Mellitus           (if abnormal result is confirmed) 5.7-6.4%   Increased risk of developing Diabetes Mellitus References:Diagnosis and Classification of Diabetes Mellitus,Diabetes [id number],4481,85(UDJSH 1):S62-S69 and Standards of Medical Care in         Diabetes - 2011,Diabetes FWYO,3785,88 (Suppl 1):S11-S61.      Recent Results (from the past 240 hour(s))  Resp Panel by RT-PCR (Flu A&B, Covid) Nasopharyngeal Swab     Status: None   Collection Time: 06/20/20  1:45 PM   Specimen: Nasopharyngeal Swab; Nasopharyngeal(NP) swabs in vial transport medium  Result Value Ref Range Status   SARS Coronavirus 2 by RT PCR NEGATIVE NEGATIVE Final    Comment: (NOTE) SARS-CoV-2 target nucleic acids are NOT DETECTED.  The SARS-CoV-2 RNA is generally detectable in upper respiratory specimens during the acute phase of infection. The lowest concentration of SARS-CoV-2 viral copies this assay can detect is 138 copies/mL. A negative result does not preclude SARS-Cov-2 infection and should not be used as the sole basis for treatment or other patient management decisions. A negative result may occur with  improper specimen collection/handling, submission of specimen other than nasopharyngeal swab, presence of viral mutation(s) within the areas targeted by this assay, and inadequate number of viral copies(<138 copies/mL). A negative result must be combined with clinical observations, patient history, and epidemiological information. The expected result is Negative.  Fact Sheet for Patients:  EntrepreneurPulse.com.au  Fact Sheet for Healthcare Providers:  IncredibleEmployment.be  This test is no t yet approved  or cleared by the [place] FDA and  has been authorized for detection and/or diagnosis of SARS-CoV-2 by FDA under an Emergency Use Authorization (EUA). This EUA will remain  in effect (meaning this test can be used) for the duration of the COVID-19 declaration under Section 564(b)(1) of the Act, 21 U.S.C.section 360bbb-3(b)(1), unless the authorization is terminated  or revoked sooner.       Influenza A by PCR NEGATIVE NEGATIVE Final   Influenza B by PCR NEGATIVE NEGATIVE Final    Comment: (NOTE) The Xpert Xpress SARS-CoV-2/FLU/RSV plus assay is intended as an aid in the diagnosis of influenza from Nasopharyngeal swab specimens and should not be used as a sole basis for treatment. Nasal  washings and aspirates are unacceptable for Xpert Xpress SARS-CoV-2/FLU/RSV testing.  Fact Sheet for Patients: EntrepreneurPulse.com.au  Fact Sheet for Healthcare Providers: IncredibleEmployment.be  This test is not yet approved or cleared by the [place] FDA and has been authorized for detection and/or diagnosis of SARS-CoV-2 by FDA under an Emergency Use Authorization (EUA). This EUA will remain in effect (meaning this test can be used) for the duration of the COVID-19 declaration under Section 564(b)(1) of the Act, 21 U.S.C. section 360bbb-3(b)(1), unless the authorization is terminated or revoked.  Performed at [facility], [place] [address] [place], [place] [address]      Scheduled Meds: . hydrALAZINE  50 mg Oral TID  . isosorbide dinitrate  30 mg Oral TID  . latanoprost  1 drop Both Eyes QHS  . levothyroxine  25 mcg Oral Daily  . loratadine  10 mg Oral Daily  . metoprolol succinate  50 mg Oral Daily  . NIFEdipine  60 mg Oral QHS  . pantoprazole (PROTONIX) IV  40 mg Intravenous Q12H  . simvastatin  40 mg Oral QHS  . sucralfate  1 g Oral TID WC & HS  . tamsulosin  0.4 mg Oral QPC supper   Continuous Infusions: . lactated ringers  100 mL/hr at 06/21/20 0458     LOS: 0 days   Cherene Altes, MD [facility] Office  [phone] Pager - Text Page per Shea Evans  If 7PM-7AM, please contact night-coverage per Amion 06/21/2020, 9:10 AM

## 2020-06-21 NOTE — Plan of Care (Signed)

## 2020-06-22 DIAGNOSIS — K922 Gastrointestinal hemorrhage, unspecified: Secondary | ICD-10-CM | POA: Diagnosis not present

## 2020-06-22 LAB — COMPREHENSIVE METABOLIC PANEL
ALT: 15 U/L (ref 0–44)
AST: 23 U/L (ref 15–41)
Albumin: 2.3 g/dL — ABNORMAL LOW (ref 3.5–5.0)
Alkaline Phosphatase: 79 U/L (ref 38–126)
Anion gap: 6 (ref 5–15)
BUN: 75 mg/dL — ABNORMAL HIGH (ref 8–23)
CO2: 19 mmol/L — ABNORMAL LOW (ref 22–32)
Calcium: 8.1 mg/dL — ABNORMAL LOW (ref 8.9–10.3)
Chloride: 115 mmol/L — ABNORMAL HIGH (ref 98–111)
Creatinine, Ser: 2.65 mg/dL — ABNORMAL HIGH (ref 0.61–1.24)
GFR, Estimated: 23 mL/min — ABNORMAL LOW (ref >60)
Glucose, Bld: 112 mg/dL — ABNORMAL HIGH (ref 70–99)
Potassium: 4.8 mmol/L (ref 3.5–5.1)
Sodium: 140 mmol/L (ref 135–145)
Total Bilirubin: 0.4 mg/dL (ref 0.3–1.2)
Total Protein: 5.3 g/dL — ABNORMAL LOW (ref 6.5–8.1)

## 2020-06-22 LAB — CBC
HCT: 26.8 % — ABNORMAL LOW (ref 39.0–52.0)
Hemoglobin: 8.5 g/dL — ABNORMAL LOW (ref 13.0–17.0)
MCH: 30.2 pg (ref 26.0–34.0)
MCHC: 31.7 g/dL (ref 30.0–36.0)
MCV: 95.4 fL (ref 80.0–100.0)
Platelets: 373 10*3/uL (ref 150–400)
RBC: 2.81 MIL/uL — ABNORMAL LOW (ref 4.22–5.81)
RDW: 15.6 % — ABNORMAL HIGH (ref 11.5–15.5)
WBC: 15.9 10*3/uL — ABNORMAL HIGH (ref 4.0–10.5)
nRBC: 0 % (ref 0.0–0.2)

## 2020-06-22 LAB — MAGNESIUM: Magnesium: 1.9 mg/dL (ref 1.7–2.4)

## 2020-06-22 MED ORDER — FUROSEMIDE 10 MG/ML IJ SOLN
60.0000 mg | Freq: Two times a day (BID) | INTRAMUSCULAR | Status: AC
  Administered 2020-06-22 – 2020-06-23 (×3): 60 mg via INTRAVENOUS
  Filled 2020-06-22 (×3): qty 6

## 2020-06-22 MED ORDER — FUROSEMIDE 10 MG/ML IJ SOLN
80.0000 mg | Freq: Once | INTRAMUSCULAR | Status: AC
  Administered 2020-06-22: 80 mg via INTRAVENOUS
  Filled 2020-06-22: qty 8

## 2020-06-22 MED ORDER — FUROSEMIDE 10 MG/ML IJ SOLN
40.0000 mg | Freq: Two times a day (BID) | INTRAMUSCULAR | Status: DC
  Administered 2020-06-22: 40 mg via INTRAVENOUS
  Filled 2020-06-22: qty 4

## 2020-06-22 MED ORDER — FUROSEMIDE 10 MG/ML IJ SOLN: 40.0000 mg | Freq: Two times a day (BID) | INTRAMUSCULAR | Status: DC

## 2020-06-22 MED ORDER — PANTOPRAZOLE SODIUM 40 MG PO TBEC
40.0000 mg | DELAYED_RELEASE_TABLET | Freq: Two times a day (BID) | ORAL | Status: DC
  Administered 2020-06-22 – 2020-07-01 (×18): 40 mg via ORAL
  Filled 2020-06-22 (×18): qty 1

## 2020-06-22 NOTE — Progress Notes (Addendum)
Brian Wolf  PPJ:[id number] DOB: 1931/07/02 DOA: 06/20/2020 PCP: [name], DO    Brief Narrative:  87 year old with a history of HTN, HLD, dementia, thromboembolic CVA, chronic combined systolic and diastolic CHF, OSA on CPAP, CKD stage IV, and upper GI bleed requiring admission in March due to a duodenal ulcer who returned to the ED with black stools x1 week with no abdominal pain but with nausea.  In the ED it was appreciated that his hemoglobin had dropped 2 g over a 5-day period.   Consultants:  Gastroenterology  Code Status: NO CODE BLUE  Antimicrobials:  None  DVT prophylaxis: SCDs  Subjective: Vital signs are stable.  Afebrile.  Saturation 96% on room air.  Hemoglobin holding steady.  BUN declining.  Has developed some mild respiratory distress starting earlier this morning with wheezing.  Significant crackles and some expiratory wheezing appreciable on exam.  Is alert and interactive and does not appear to be in significant distress.  Assessment & Plan:  Upper GI bleed /melena Admitted March 2022 with duodenal ulcer -has had 2 endoscopies within the past year with both showing a clean-based duodenal ulcer -further history reveals patient had not been taking his Protonix for approximately 10 days -no plans for repeat endoscopy presently -PPI resumed -advance diet   Acute blood loss anemia Hemoglobin initially declined with hydration as would be expected -appears to have reached nadir at approximately 8.5 -continue to monitor -has not required a transfusion  Recent Labs  Lab 06/15/20 1021 06/20/20 1110 06/20/20 1935 06/21/20 0112 06/22/20 0144  HGB 11.9* 9.9* 9.4* 8.5* 8.5*    Pulmonary edema -wheezing Appears to be primarily related to simple pulmonary edema and an uncomplicated mild pleural effusion -do not presently see evidence to suggest a true infection -continue as needed nebs -resume usual diuretic which the patient has not taken in over a week  CKD  stage IV Creatinine improving with volume expansion - baseline creatinine appears to be 2.5-2.8 - watch w/ need to diurese   Recent Labs  Lab 06/15/20 1021 06/20/20 1110 06/21/20 0112 06/22/20 0144  CREATININE 2.59* 3.22* 2.75* 2.65*    Chronic combined systolic and diastolic CHF EF 58-09% via TTE March 2022 - appears mildly volume overloaded at this time, primarily manifest as pulmonary edema/effusio - diurese and follow   Filed Weights   06/20/20 1418  Weight: 68 kg     Hyperkalemia Corrected with volume support and Lokelma  HTN Blood pressure currently well controlled  Hypothyroidism Continue usual home medical therapy  Dementia Has required near total assistance since March 2022  HLD  Glaucoma Continue usual home eyedrops    Family Communication: Spoke with wife at bedside Status is: Inpatient  Remains inpatient appropriate because:Inpatient level of care appropriate due to severity of illness   Dispo: The patient is from: Home              Anticipated d/c is to: unclear              Patient currently is not medically stable to d/c.   Difficult to place patient No   Objective: Blood pressure (!) 122/58, pulse 69, temperature 98.2 F (36.8 C), temperature source Oral, resp. rate 18, height 5\' 4"  (1.626 m), weight 68 kg, SpO2 96 %.  Intake/Output Summary (Last 24 hours) at 06/22/2020 0902 Last data filed at 06/22/2020 0454 Gross per 24 hour  Intake 1144.25 ml  Output 1100 ml  Net 44.25 ml   [employer]  06/20/20 1418  Weight: 68 kg    Examination: General: very mild resp distress - not in extremis  Lungs: Mild bibasilar crackles with blunting of breath sounds right base and mild expiratory wheezing Cardiovascular: Regular rate and rhythm without murmur  Abdomen: Nontender, nondistended, soft, bowel sounds positive, no rebound, no ascites, no appreciable mass Extremities: Trace bilateral lower extremity edema  CBC: Recent Labs  Lab  06/20/20 1110 06/20/20 1935 06/21/20 0112 06/22/20 0144  WBC 15.7* 15.4* 13.8* 15.9*  NEUTROABS 9.3*  --   --   --   HGB 9.9* 9.4* 8.5* 8.5*  HCT 31.8* 30.1* 26.9* 26.8*  MCV 97.2 95.0 95.1 95.4  PLT 367 334 304 267   Basic Metabolic Panel: Recent Labs  Lab 06/20/20 1110 06/21/20 0112 06/22/20 0144  NA 139 138 140  K 5.7* 4.7 4.8  CL 115* 114* 115*  CO2 15* 16* 19*  GLUCOSE 99 106* 112*  BUN 100* 91* 75*  CREATININE 3.22* 2.75* 2.65*  CALCIUM 8.3* 8.0* 8.1*  MG  --   --  1.9   GFR: Estimated Creatinine Clearance: 16.8 mL/min (A) (by C-G formula based on SCr of 2.65 mg/dL (H)).  Liver Function Tests: Recent Labs  Lab 06/15/20 1021 06/20/20 1110 06/22/20 0144  AST 43* 31 23  ALT 27 18 15   ALKPHOS 113 95 79  BILITOT 0.7 0.6 0.4  PROT 6.4* 6.1* 5.3*  ALBUMIN 2.8* 2.6* 2.3*   Recent Labs  Lab 06/15/20 1021  LIPASE 43    HbA1C: Hgb A1c MFr Bld  Date/Time Value Ref Range Status  12/15/2013 03:42 AM 6.4 (H) <5.7 % Final    Comment:    (NOTE)                                                                       According to the ADA Clinical Practice Recommendations for 2011, when HbA1c is used as a screening test:  >=6.5%   Diagnostic of Diabetes Mellitus           (if abnormal result is confirmed) 5.7-6.4%   Increased risk of developing Diabetes Mellitus References:Diagnosis and Classification of Diabetes Mellitus,Diabetes TIWP,8099,83(JASNK 1):S62-S69 and Standards of Medical Care in         Diabetes - 2011,Diabetes NLZJ,6734,19 (Suppl 1):S11-S61.      Recent Results (from the past 240 hour(s))  Resp Panel by RT-PCR (Flu A&B, Covid) Nasopharyngeal Swab     Status: None   Collection Time: 06/20/20  1:45 PM   Specimen: Nasopharyngeal Swab; Nasopharyngeal(NP) swabs in vial transport medium  Result Value Ref Range Status   SARS Coronavirus 2 by RT PCR NEGATIVE NEGATIVE Final    Comment: (NOTE) SARS-CoV-2 target nucleic acids are NOT DETECTED.  The  SARS-CoV-2 RNA is generally detectable in upper respiratory specimens during the acute phase of infection. The lowest concentration of SARS-CoV-2 viral copies this assay can detect is 138 copies/mL. A negative result does not preclude SARS-Cov-2 infection and should not be used as the sole basis for treatment or other patient management decisions. A negative result may occur with  improper specimen collection/handling, submission of specimen other than nasopharyngeal swab, presence of viral mutation(s) within the areas targeted by this assay, and inadequate number of  viral copies(<138 copies/mL). A negative result must be combined with clinical observations, patient history, and epidemiological information. The expected result is Negative.  Fact Sheet for Patients:  EntrepreneurPulse.com.au  Fact Sheet for Healthcare Providers:  IncredibleEmployment.be  This test is no t yet approved or cleared by the [place] FDA and  has been authorized for detection and/or diagnosis of SARS-CoV-2 by FDA under an Emergency Use Authorization (EUA). This EUA will remain  in effect (meaning this test can be used) for the duration of the COVID-19 declaration under Section 564(b)(1) of the Act, 21 U.S.C.section 360bbb-3(b)(1), unless the authorization is terminated  or revoked sooner.       Influenza A by PCR NEGATIVE NEGATIVE Final   Influenza B by PCR NEGATIVE NEGATIVE Final    Comment: (NOTE) The Xpert Xpress SARS-CoV-2/FLU/RSV plus assay is intended as an aid in the diagnosis of influenza from Nasopharyngeal swab specimens and should not be used as a sole basis for treatment. Nasal washings and aspirates are unacceptable for Xpert Xpress SARS-CoV-2/FLU/RSV testing.  Fact Sheet for Patients: EntrepreneurPulse.com.au  Fact Sheet for Healthcare Providers: IncredibleEmployment.be  This test is not yet approved or  cleared by the [place] FDA and has been authorized for detection and/or diagnosis of SARS-CoV-2 by FDA under an Emergency Use Authorization (EUA). This EUA will remain in effect (meaning this test can be used) for the duration of the COVID-19 declaration under Section 564(b)(1) of the Act, 21 U.S.C. section 360bbb-3(b)(1), unless the authorization is terminated or revoked.  Performed at [facility], [place] [address] [place], [place] [address]      Scheduled Meds: . hydrALAZINE  50 mg Oral TID  . isosorbide dinitrate  30 mg Oral TID  . latanoprost  1 drop Both Eyes QHS  . levothyroxine  25 mcg Oral Daily  . loratadine  10 mg Oral Daily  . metoprolol succinate  50 mg Oral Daily  . NIFEdipine  60 mg Oral QHS  . pantoprazole (PROTONIX) IV  40 mg Intravenous Q12H  . simvastatin  40 mg Oral QHS  . sucralfate  1 g Oral TID WC & HS  . tamsulosin  0.4 mg Oral QPC supper     LOS: 1 day   Cherene Altes, MD [facility] Office  [phone] Pager - Text Page per Amion  If 7PM-7AM, please contact night-coverage per Amion 06/22/2020, 9:02 AM

## 2020-06-22 NOTE — Progress Notes (Signed)
Pt placed on 2L [place] after c/o SOB. With with crackles and inspiratory wheezes, mostly upper airway. RN is giving lasix now, HHN already given. RT will monitor pt closely.

## 2020-06-22 NOTE — Progress Notes (Signed)
Patient has audible wheezing with crackles upon auscultation. Lasix 40 mg IV given, prn neb given, patient now on 2L of O2 after RT was notified. Wife at bedside.

## 2020-06-23 ENCOUNTER — Inpatient Hospital Stay (HOSPITAL_COMMUNITY): Payer: Medicare Other

## 2020-06-23 DIAGNOSIS — K922 Gastrointestinal hemorrhage, unspecified: Secondary | ICD-10-CM | POA: Diagnosis not present

## 2020-06-23 LAB — CBC
HCT: 26.1 % — ABNORMAL LOW (ref 39.0–52.0)
Hemoglobin: 8.3 g/dL — ABNORMAL LOW (ref 13.0–17.0)
MCH: 30 pg (ref 26.0–34.0)
MCHC: 31.8 g/dL (ref 30.0–36.0)
MCV: 94.2 fL (ref 80.0–100.0)
Platelets: 358 10*3/uL (ref 150–400)
RBC: 2.77 MIL/uL — ABNORMAL LOW (ref 4.22–5.81)
RDW: 15.8 % — ABNORMAL HIGH (ref 11.5–15.5)
WBC: 13.6 10*3/uL — ABNORMAL HIGH (ref 4.0–10.5)
nRBC: 0 % (ref 0.0–0.2)

## 2020-06-23 LAB — BASIC METABOLIC PANEL
Anion gap: 6 (ref 5–15)
BUN: 73 mg/dL — ABNORMAL HIGH (ref 8–23)
CO2: 19 mmol/L — ABNORMAL LOW (ref 22–32)
Calcium: 7.8 mg/dL — ABNORMAL LOW (ref 8.9–10.3)
Chloride: 112 mmol/L — ABNORMAL HIGH (ref 98–111)
Creatinine, Ser: 2.86 mg/dL — ABNORMAL HIGH (ref 0.61–1.24)
GFR, Estimated: 21 mL/min — ABNORMAL LOW (ref >60)
Glucose, Bld: 107 mg/dL — ABNORMAL HIGH (ref 70–99)
Potassium: 4.5 mmol/L (ref 3.5–5.1)
Sodium: 137 mmol/L (ref 135–145)

## 2020-06-23 LAB — MAGNESIUM: Magnesium: 1.8 mg/dL (ref 1.7–2.4)

## 2020-06-23 MED ORDER — FUROSEMIDE 40 MG PO TABS
40.0000 mg | ORAL_TABLET | Freq: Two times a day (BID) | ORAL | Status: DC
  Administered 2020-06-24: 40 mg via ORAL
  Filled 2020-06-23: qty 1

## 2020-06-23 NOTE — Care Management (Cosign Needed)
    Durable Medical Equipment  (From admission, onward)         Start     Ordered   06/23/20 1213  For home use only DME standard manual wheelchair with seat cushion  Once       Comments: Patient suffers from Pulmonary edema -wheezing which impairs their ability to perform daily activities like ambulating   in the home.  A cane will not resolve issue with performing activities of daily living. A wheelchair will allow patient to safely perform daily activities. Patient can safely propel the wheelchair in the home or has a caregiver who can provide assistance. Length of need lifetime. Accessories: elevating leg rests (ELRs), wheel locks, extensions and anti-tippers.  Seat and back cushions.   06/23/20 1213

## 2020-06-23 NOTE — Progress Notes (Signed)
Brian Wolf  ONG:[id number] DOB: 02-14-1931 DOA: 06/20/2020 PCP: [name], DO    Brief Narrative:  [phone] with a history of HTN, HLD, dementia, thromboembolic CVA, chronic combined systolic and diastolic CHF, OSA on CPAP, CKD stage IV, and upper GI bleed requiring admission in March due to a duodenal ulcer who returned to the ED with black stools x1 week with no abdominal pain but with nausea.  In the ED it was appreciated that his hemoglobin had dropped 2 g over a 5-day period.   Consultants:  Gastroenterology  Code Status: NO CODE BLUE  Antimicrobials:  None  DVT prophylaxis: SCDs  Subjective: Experienced intermittent recurrent respiratory difficulty yesterday felt to be related primarily to pulmonary edema.  Significant diuresis accomplished yesterday.  I/O presently even.  Afebrile.  Vital signs stable.  Saturations 100% on minimal nasal cannula support.  Sitting up in a bedside chair breathing comfortably at the time of my visit.  In no acute distress.  Assessment & Plan:  Upper GI bleed /melena Admitted March 2022 with duodenal ulcer -has had 2 endoscopies within the past year with both showing a clean-based duodenal ulcer -further history reveals patient had not been taking his Protonix for approximately 10 days -no plans for repeat endoscopy presently -PPI resumed -advanced diet without difficulty  Acute blood loss anemia Hemoglobin initially declined with hydration as would be expected -appears to have reached nadir at approximately 8.5 -continue to monitor -has not required a transfusion -hemoglobin presently stable  Recent Labs  Lab 06/20/20 1110 06/20/20 1935 06/21/20 0112 06/22/20 0144 06/23/20 0117  HGB 9.9* 9.4* 8.5* 8.5* 8.3*    Pulmonary edema -wheezing -acute hypoxic respiratory failure Appears to be primarily related to simple pulmonary edema and an uncomplicated mild pleural effusion - no evidence to suggest a true infection -continue as needed  nebs - continue higher dose diuretic and follow - much improved today   CKD stage IV Creatinine initially improved with volume expansion - baseline creatinine appears to be 2.5-2.8 -continue to monitor trend with resumption of diuretic -we will likely need to tolerate some degree of creatinine worsening to maximize oxygenation  Recent Labs  Lab 06/20/20 1110 06/21/20 0112 06/22/20 0144 06/23/20 0117  CREATININE 3.22* 2.75* 2.65* 2.86*    Chronic combined systolic and diastolic CHF EF 02-72% via TTE March 2022 -clinically stabilizing with increased diuretic - no gross overload on physical exam   Filed Weights   06/20/20 1418  Weight: 68 kg     Hyperkalemia Corrected with volume support and Lokelma  HTN Blood pressure controlled  Hypothyroidism Continue usual home medical therapy  Dementia Has required near total assistance since March 2022  HLD  Glaucoma Continue usual home eyedrops    Family Communication: Spoke with wife at bedside Status is: Inpatient  Remains inpatient appropriate because:Inpatient level of care appropriate due to severity of illness   Dispo: The patient is from: Home              Anticipated d/c is to: unclear              Patient currently is not medically stable to d/c.   Difficult to place patient No   Objective: Blood pressure (!) 121/47, pulse 68, temperature 98.8 F (37.1 C), temperature source Oral, resp. rate 19, height 5\' 4"  (1.626 m), weight 68 kg, SpO2 100 %.  Intake/Output Summary (Last 24 hours) at 06/23/2020 0849 Last data filed at 06/23/2020 0513 Gross per 24 hour  Intake  695 ml  Output 200 ml  Net 495 ml   Filed Weights   06/20/20 1418  Weight: 68 kg    Examination: General: No respiratory distress Lungs: Very minimal basilar crackles with no wheezing Cardiovascular: Regular rate and rhythm without murmur  Abdomen: NT/ND, soft, BS positive Extremities: No significant lower extremity edema  CBC: Recent  Labs  Lab 06/20/20 1110 06/20/20 1935 06/21/20 0112 06/22/20 0144 06/23/20 0117  WBC 15.7*   < > 13.8* 15.9* 13.6*  NEUTROABS 9.3*  --   --   --   --   HGB 9.9*   < > 8.5* 8.5* 8.3*  HCT 31.8*   < > 26.9* 26.8* 26.1*  MCV 97.2   < > 95.1 95.4 94.2  PLT 367   < > 304 373 358   < > = values in this interval not displayed.   Basic Metabolic Panel: Recent Labs  Lab 06/21/20 0112 06/22/20 0144 06/23/20 0117  NA 138 140 137  K 4.7 4.8 4.5  CL 114* 115* 112*  CO2 16* 19* 19*  GLUCOSE 106* 112* 107*  BUN 91* 75* 73*  CREATININE 2.75* 2.65* 2.86*  CALCIUM 8.0* 8.1* 7.8*  MG  --  1.9 1.8   GFR: Estimated Creatinine Clearance: 15.5 mL/min (A) (by C-G formula based on SCr of 2.86 mg/dL (H)).  Liver Function Tests: Recent Labs  Lab 06/20/20 1110 06/22/20 0144  AST 31 23  ALT 18 15  ALKPHOS 95 79  BILITOT 0.6 0.4  PROT 6.1* 5.3*  ALBUMIN 2.6* 2.3*    HbA1C: Hgb A1c MFr Bld  Date/Time Value Ref Range Status  12/15/2013 03:42 AM 6.4 (H) <5.7 % Final    Comment:    (NOTE)                                                                       According to the ADA Clinical Practice Recommendations for 2011, when HbA1c is used as a screening test:  >=6.5%   Diagnostic of Diabetes Mellitus           (if abnormal result is confirmed) 5.7-6.4%   Increased risk of developing Diabetes Mellitus References:Diagnosis and Classification of Diabetes Mellitus,Diabetes IFOY,7741,28(NOMVE 1):S62-S69 and Standards of Medical Care in         Diabetes - 2011,Diabetes Care,2011,34 (Suppl 1):S11-S61.      Recent Results (from the past 240 hour(s))  Resp Panel by RT-PCR (Flu A&B, Covid) Nasopharyngeal Swab     Status: None   Collection Time: 06/20/20  1:45 PM   Specimen: Nasopharyngeal Swab; Nasopharyngeal(NP) swabs in vial transport medium  Result Value Ref Range Status   SARS Coronavirus 2 by RT PCR NEGATIVE NEGATIVE Final    Comment: (NOTE) SARS-CoV-2 target nucleic acids are NOT  DETECTED.  The SARS-CoV-2 RNA is generally detectable in upper respiratory specimens during the acute phase of infection. The lowest concentration of SARS-CoV-2 viral copies this assay can detect is 138 copies/mL. A negative result does not preclude SARS-Cov-2 infection and should not be used as the sole basis for treatment or other patient management decisions. A negative result may occur with  improper specimen collection/handling, submission of specimen other than nasopharyngeal swab, presence of viral mutation(s) within the  areas targeted by this assay, and inadequate number of viral copies(<138 copies/mL). A negative result must be combined with clinical observations, patient history, and epidemiological information. The expected result is Negative.  Fact Sheet for Patients:  EntrepreneurPulse.com.au  Fact Sheet for Healthcare Providers:  IncredibleEmployment.be  This test is no t yet approved or cleared by the [place] FDA and  has been authorized for detection and/or diagnosis of SARS-CoV-2 by FDA under an Emergency Use Authorization (EUA). This EUA will remain  in effect (meaning this test can be used) for the duration of the COVID-19 declaration under Section 564(b)(1) of the Act, 21 U.S.C.section 360bbb-3(b)(1), unless the authorization is terminated  or revoked sooner.       Influenza A by PCR NEGATIVE NEGATIVE Final   Influenza B by PCR NEGATIVE NEGATIVE Final    Comment: (NOTE) The Xpert Xpress SARS-CoV-2/FLU/RSV plus assay is intended as an aid in the diagnosis of influenza from Nasopharyngeal swab specimens and should not be used as a sole basis for treatment. Nasal washings and aspirates are unacceptable for Xpert Xpress SARS-CoV-2/FLU/RSV testing.  Fact Sheet for Patients: EntrepreneurPulse.com.au  Fact Sheet for Healthcare Providers: IncredibleEmployment.be  This test is not yet  approved or cleared by the [place] FDA and has been authorized for detection and/or diagnosis of SARS-CoV-2 by FDA under an Emergency Use Authorization (EUA). This EUA will remain in effect (meaning this test can be used) for the duration of the COVID-19 declaration under Section 564(b)(1) of the Act, 21 U.S.C. section 360bbb-3(b)(1), unless the authorization is terminated or revoked.  Performed at [facility], [address], [place], [place] [address]      Scheduled Meds: . furosemide  60 mg Intravenous Q12H  . hydrALAZINE  50 mg Oral TID  . isosorbide dinitrate  30 mg Oral TID  . latanoprost  1 drop Both Eyes QHS  . levothyroxine  25 mcg Oral Daily  . loratadine  10 mg Oral Daily  . metoprolol succinate  50 mg Oral Daily  . NIFEdipine  60 mg Oral QHS  . pantoprazole  40 mg Oral BID  . simvastatin  40 mg Oral QHS  . sucralfate  1 g Oral TID WC & HS  . tamsulosin  0.4 mg Oral QPC supper     LOS: 2 days   Cherene Altes, MD [facility] Office  [phone] Pager - Text Page per Shea Evans  If 7PM-7AM, please contact night-coverage per Amion 06/23/2020, 8:49 AM

## 2020-06-23 NOTE — TOC Initial Note (Addendum)
Transition of Care [facility]) - Initial/Assessment Note    Patient Details  Name: Brian Wolf MRN: [id number] Date of Birth: 1931/03/05  [facility]) CM/SW Contact:    Marilu Favre, RN Phone Number: 06/23/2020, 1:12 PM  Clinical Narrative:                 Spoke to patient wife at bedside. Patient from home. Address: [address] [place] , [place] [address]   Patient from home with wife.   PT/OT recommending HHPT/OT, patient has had [facility] in past and wife requesting [facility] again.   Wife also requesting wheel chair and wants w/c delivered to hospital room today. Danielle with [facility] aware. NCM asked Andee Poles to call wife directly with any questions.   Amy with [facility] accepted referral for HHPT/OT   Patient does not have home oxygen, currently on home oxygen , if needed at home will need oxygen saturation ambulation note and order.  Wife plans to transport patient home in private car. Does not want ambulance .  Expected Discharge Plan: [place] Barriers to Discharge: Continued Medical Work up   Patient Goals and CMS Choice   CMS Medicare.gov Compare Post Acute Care list provided to:: Patient Choice offered to / list presented to : Spouse,Patient  Expected Discharge Plan and Services Expected Discharge Plan: [place] Choice: [place] arrangements for the past 2 months: Single Family Home                 DME Arranged: Wheelchair manual DME Agency: [employer] Date DME Agency Contacted: 06/23/20 Time DME Agency Contacted: 1312 Representative spoke with at DME Agency: [place]: PT,OT [place] Agency: [place] Date [place]: 06/23/20 Time HH Agency Contacted: 67 Representative spoke with at [place]: Amy  Prior Living Arrangements/Services Living arrangements for the past 2 months: [place]  with:: Spouse Patient language and need for interpreter reviewed:: Yes            Current home services: DME Criminal Activity/Legal Involvement Pertinent to Current Situation/Hospitalization: No - Comment as needed  Activities of Daily Living Home Assistive Devices/Equipment: Environmental consultant (specify type) ADL Screening (condition at time of admission) Patient's cognitive ability adequate to safely complete daily activities?: Yes Is the patient deaf or have difficulty hearing?: No Does the patient have difficulty seeing, even when wearing glasses/contacts?: Yes Does the patient have difficulty concentrating, remembering, or making decisions?: Yes Patient able to express need for assistance with ADLs?: Yes Does the patient have difficulty dressing or bathing?: Yes Independently performs ADLs?: No Does the patient have difficulty walking or climbing stairs?: Yes Weakness of Legs: Both Weakness of Arms/Hands: Both  Permission Sought/Granted   Permission granted to share information with : Yes, Verbal Permission Granted  Share Information with NAME: wife           Emotional Assessment              Admission diagnosis:  Dehydration [E86.0] Hyperkalemia [E87.5] Acute upper GI bleeding [K92.2] Acute GI bleeding [K92.2] Elevated serum creatinine [R79.89] GIB (gastrointestinal bleeding) [K92.2] Patient Active Problem List   Diagnosis Date Noted  . GIB (gastrointestinal bleeding) 06/21/2020  . Acute upper GI bleeding 06/20/2020  . Hypertension   . Dementia ([place])   . Chronic combined systolic and diastolic CHF (congestive heart failure) ([place])   . OSA on CPAP   .  Stage 4 chronic kidney disease ([place])   . New onset atrial fibrillation ([place])   . CHF (congestive heart failure) ([place]) 05/13/2020  . Bronchitis with acute wheezing 05/13/2020  . Leukocytosis 05/13/2020  . Elevated troponin 05/13/2020  . CKD (chronic kidney disease), stage IV ([place]) 05/13/2020  . DNR (do not resuscitate)  05/13/2020  . Sensorineural hearing loss, bilateral 05/01/2020  . Hyperlipidemia 05/01/2020  . Benign neoplasm of colon 05/01/2020  . Benign prostatic hyperplasia 05/01/2020  . Cardiomyopathy ([place]) 05/01/2020  . Dyslipidemia 05/01/2020  . Glaucoma 05/01/2020  . History of anemia 05/01/2020  . Hypertensive retinopathy 05/01/2020  . Malignant hypertensive chronic kidney disease 05/01/2020  . Personal history of other diseases of the digestive system 05/01/2020  . Sinus node dysfunction ([place]) 05/01/2020  . Malnutrition of moderate degree 04/14/2020  . SOB (shortness of breath)   . Bilateral lower extremity edema 04/05/2020  . Acute blood loss anemia 04/05/2020  . Pleural effusion 04/05/2020  . Acute diastolic CHF (congestive heart failure) ([place]) 04/05/2020  . Acute upper GI bleed 10/17/2019  . Hyperkalemia 10/17/2019  . Acute kidney injury superimposed on chronic kidney disease ([place]) 10/17/2019  . Fall   . Personal history of transient ischemic attack (TIA), and cerebral infarction without residual deficits 03/09/2016  . Obstructive sleep apnea syndrome 03/09/2016  . SAH (subarachnoid hemorrhage) ([place]) 03/09/2016  . Subarachnoid bleed ([place]) 03/08/2016  . Hypertension, essential   . Sleep difficulties   . Homonymous hemianopsia 01/14/2014  . Homonymous hemianopsia due to recent cerebral infarction 12/24/2013  . Left-sided neglect 12/24/2013  . Gout flare 12/20/2013  . Benign essential HTN 12/20/2013  . Acute hemorrhagic infarction of brain ([place]) 12/20/2013  . Hemorrhagic stroke ([place])   . Gout   . Hypokalemia 12/19/2013  . Altered mental status   . Encounter for immunization   . Encephalopathy acute   . ICH (intracerebral hemorrhage) ([place]) 12/14/2013  . Fever    PCP:  [name], DO Pharmacy:   [facility] [place], [place] [address] Phone: [phone] Fax:  [phone]  [name] [address] [place] [address] Phone: [phone] Fax: [phone]     Social Determinants of Health (SDOH) Interventions    Readmission Risk Interventions Readmission Risk Prevention Plan 05/16/2020  Transportation Screening Complete  PCP or Specialist Appt within 3-5 Days Complete  HRI or [place] Complete  Social Work Consult for [place] Planning/Counseling Complete  Palliative Care Screening Not Applicable  Medication Review Press photographer) Complete  Some recent data might be hidden

## 2020-06-24 DIAGNOSIS — K922 Gastrointestinal hemorrhage, unspecified: Secondary | ICD-10-CM | POA: Diagnosis not present

## 2020-06-24 LAB — CBC
HCT: 29.8 % — ABNORMAL LOW (ref 39.0–52.0)
Hemoglobin: 9.4 g/dL — ABNORMAL LOW (ref 13.0–17.0)
MCH: 30.1 pg (ref 26.0–34.0)
MCHC: 31.5 g/dL (ref 30.0–36.0)
MCV: 95.5 fL (ref 80.0–100.0)
Platelets: 395 10*3/uL (ref 150–400)
RBC: 3.12 MIL/uL — ABNORMAL LOW (ref 4.22–5.81)
RDW: 16 % — ABNORMAL HIGH (ref 11.5–15.5)
WBC: 14.9 10*3/uL — ABNORMAL HIGH (ref 4.0–10.5)
nRBC: 0 % (ref 0.0–0.2)

## 2020-06-24 LAB — BASIC METABOLIC PANEL
Anion gap: 7 (ref 5–15)
BUN: 73 mg/dL — ABNORMAL HIGH (ref 8–23)
CO2: 21 mmol/L — ABNORMAL LOW (ref 22–32)
Calcium: 8.1 mg/dL — ABNORMAL LOW (ref 8.9–10.3)
Chloride: 112 mmol/L — ABNORMAL HIGH (ref 98–111)
Creatinine, Ser: 3.31 mg/dL — ABNORMAL HIGH (ref 0.61–1.24)
GFR, Estimated: 17 mL/min — ABNORMAL LOW (ref >60)
Glucose, Bld: 98 mg/dL (ref 70–99)
Potassium: 4.2 mmol/L (ref 3.5–5.1)
Sodium: 140 mmol/L (ref 135–145)

## 2020-06-24 MED ORDER — FUROSEMIDE 40 MG PO TABS
40.0000 mg | ORAL_TABLET | Freq: Two times a day (BID) | ORAL | Status: DC
  Administered 2020-06-25 – 2020-06-27 (×5): 40 mg via ORAL
  Filled 2020-06-24 (×5): qty 1

## 2020-06-24 NOTE — Progress Notes (Signed)
Physical Therapy Treatment Patient Details Name: Brian Wolf MRN: [id number] DOB: 1931/11/09 Today's Date: 06/24/2020    History of Present Illness 87 y.o. male with medical history significant of HTN; HLD; dementia; CVA (2015); chronic combined CHF; OSA on CPAP; stage 4 CKD; and admission for UGI bleed in March (EGD with non-bleeding duodenal ulcer with no stigmata of bleeding) presenting with GI bleeding.    PT Comments    Pt seated in recliner sleeping on arrival.  Wife attempted to wake patient, he would moan and head nod but would not open his eyes.  Assisted patient back to bed but unable to progress therapy further.     Follow Up Recommendations  Home health PT;Supervision/Assistance - 24 hour     Equipment Recommendations  Wheelchair (measurements PT)    Recommendations for Other Services       Precautions / Restrictions Precautions Precautions: Fall Restrictions Weight Bearing Restrictions: No    Mobility  Bed Mobility Overal bed mobility: Needs Assistance Bed Mobility: Supine to Sit;Sit to Supine       Sit to supine: Mod assist   General bed mobility comments: Mod assistance to move into supine.  Lift B LEs into bed.    Transfers Overall transfer level: Needs assistance Equipment used: None Transfers: Sit to/from [facility] Sit to Stand: Mod assist Stand pivot transfers: Mod assist       General transfer comment: assist to power up and turn and sit.  Pt very drowsy but able to accept weight into his LEs.  Ambulation/Gait Ambulation/Gait assistance:  (unable)               Stairs             Wheelchair Mobility    Modified Rankin (Stroke Patients Only)       Balance Overall balance assessment: Needs assistance Sitting-balance support: No upper extremity supported;Feet supported Sitting balance-[name]: Good       Standing balance-[name]: Poor                               Cognition Arousal/Alertness: Lethargic Behavior During Therapy: Flat affect Overall Cognitive Status: History of cognitive impairments - at baseline                                 General Comments: Pt would not open eyes, only responded with head moans      Exercises      General Comments        Pertinent Vitals/Pain Pain Assessment: No/denies pain    Home Living                      Prior Function            PT Goals (current goals can now be found in the care plan section) Acute Rehab PT Goals Patient Stated Goal: return home with home health therapy Potential to Achieve Goals: Good Progress towards PT goals: Progressing toward goals    Frequency    Min 3X/week      PT Plan Current plan remains appropriate    Co-evaluation              AM-PAC PT "6 Clicks" Mobility   Outcome Measure  Help needed turning from your back to your side while in a flat bed without using bedrails?: A  Little Help needed moving from lying on your back to sitting on the side of a flat bed without using bedrails?: A Little Help needed moving to and from a bed to a chair (including a wheelchair)?: A Little Help needed standing up from a chair using your arms (e.g., wheelchair or bedside chair)?: A Little Help needed to walk in hospital room?: A Little Help needed climbing 3-5 steps with a railing? : A Lot 6 Click Score: 17    End of Session Equipment Utilized During Treatment: Gait belt Activity Tolerance: Patient tolerated treatment well Patient left: in bed;with call bell/phone within reach;with family/visitor present;with bed alarm set Nurse Communication: Mobility status (lethargic presentation.) PT Visit Diagnosis: Difficulty in walking, not elsewhere classified (R26.2);Muscle weakness (generalized) (M62.81)     Time: 2956-2130 PT Time Calculation (min) (ACUTE ONLY): 20 min  Charges:  $Therapeutic Activity: 8-22 mins                      Brian Wolf , PTA Acute Rehabilitation Services Pager [phone] Office [phone]     Brian Wolf Eli Hose 06/24/2020, 5:46 PM

## 2020-06-24 NOTE — Care Management Important Message (Signed)
Important Message  Patient Details  Name: Brian Wolf MRN: [id number] Date of Birth: 09-21-1931   Medicare Important Message Given:  Yes     Orbie Pyo 06/24/2020, 4:11 PM

## 2020-06-24 NOTE — Progress Notes (Signed)
Brian Wolf  EYC:[id number] DOB: May 07, 1931 DOA: 06/20/2020 PCP: [name], DO    Brief Narrative:  [phone] with a history of HTN, HLD, dementia, thromboembolic CVA, chronic combined systolic and diastolic CHF, OSA on CPAP, CKD stage IV, and upper GI bleed requiring admission in March due to a duodenal ulcer who returned to the ED with black stools x1 week with no abdominal pain but with nausea.  In the ED it was appreciated that his hemoglobin had dropped 2 g over a 5-day period.   Consultants:  Gastroenterology  Code Status: NO CODE BLUE  Antimicrobials:  None  DVT prophylaxis: SCDs  Subjective: Afebrile.  Vital signs stable.  Oxygen saturation 98% on 2 L nasal cannula.  Creatinine increased today at 3.3.  No new complaints today.  Appears to be improving nicely.  Assessment & Plan:  Upper GI bleed /melena Admitted March 2022 with duodenal ulcer -has had 2 endoscopies within the past year with both showing a clean-based duodenal ulcer -further history reveals patient had not been taking his Protonix for approximately 10 days -no plans for repeat endoscopy presently -PPI resumed -advanced diet without difficulty - this issue appears stable  Acute blood loss anemia Hemoglobin initially declined with hydration as would be expected -appears to have reached nadir at approximately 8.5 - has not required a transfusion -hemoglobin presently stable/improving   Recent Labs  Lab 06/20/20 1935 06/21/20 0112 06/22/20 0144 06/23/20 0117 06/24/20 0756  HGB 9.4* 8.5* 8.5* 8.3* 9.4*    Pulmonary edema -wheezing -acute hypoxic respiratory failure Appears to be primarily related to simple pulmonary edema and an uncomplicated mild pleural effusion - no evidence to suggest a true infection -continue as needed nebs - continue higher dose diuretic and follow - much improved today - recheck CXR in AM  CKD stage IV w/ acute renal insult Creatinine initially improved with volume expansion -  baseline creatinine appears to be 2.5-2.8 -continue to monitor trend with use of diuretic -we will likely need to tolerate some degree of creatinine worsening to maximize oxygenation - follow another 24hrs w/ current upward trend - hold PM dose of lasix today   Recent Labs  Lab 06/20/20 1110 06/21/20 0112 06/22/20 0144 06/23/20 0117 06/24/20 0756  CREATININE 3.22* 2.75* 2.65* 2.86* 3.31*    Chronic combined systolic and diastolic CHF EF 02-63% via TTE March 2022 -clinically stabilizing with increased diuretic - no gross overload on physical exam - I/O do not appear to be accurate   [employer]   06/20/20 1418  Weight: 68 kg     Hyperkalemia Corrected with volume support and Lokelma  HTN Blood pressure controlled  Hypothyroidism Continue usual home medical therapy  Dementia Has required near total assistance since March 2022  HLD  Glaucoma Continue usual home eyedrops  Disposition  F/u CXR in AM - f/u crt in AM - d/c home when crt stable, pt weaned to RA, and w/ stable findings on exam and CXR    Family Communication: Spoke with wife at bedside Status is: Inpatient  Remains inpatient appropriate because:Inpatient level of care appropriate due to severity of illness   Dispo: The patient is from: Home              Anticipated d/c is to: unclear              Patient currently is not medically stable to d/c.   Difficult to place patient No   Objective: Blood pressure (!) 121/46, pulse 65,  temperature 97.9 F (36.6 C), resp. rate 16, height 5\' 4"  (1.626 m), weight 68 kg, SpO2 98 %.  Intake/Output Summary (Last 24 hours) at 06/24/2020 1055 Last data filed at 06/24/2020 0950 Gross per 24 hour  Intake 460 ml  Output 975 ml  Net -515 ml   Filed Weights   06/20/20 1418  Weight: 68 kg    Examination: General: No respiratory distress Lungs: Very minimal basilar crackles - no wheeze - good air movement th/o  Cardiovascular: Regular rate and rhythm without  murmur  Abdomen: NT/ND, soft, BS positive Extremities: No significant lower extremity edema B  CBC: Recent Labs  Lab 06/20/20 1110 06/20/20 1935 06/22/20 0144 06/23/20 0117 06/24/20 0756  WBC 15.7*   < > 15.9* 13.6* 14.9*  NEUTROABS 9.3*  --   --   --   --   HGB 9.9*   < > 8.5* 8.3* 9.4*  HCT 31.8*   < > 26.8* 26.1* 29.8*  MCV 97.2   < > 95.4 94.2 95.5  PLT 367   < > 373 358 395   < > = values in this interval not displayed.   Basic Metabolic Panel: Recent Labs  Lab 06/22/20 0144 06/23/20 0117 06/24/20 0756  NA 140 137 140  K 4.8 4.5 4.2  CL 115* 112* 112*  CO2 19* 19* 21*  GLUCOSE 112* 107* 98  BUN 75* 73* 73*  CREATININE 2.65* 2.86* 3.31*  CALCIUM 8.1* 7.8* 8.1*  MG 1.9 1.8  --    GFR: Estimated Creatinine Clearance: 13.4 mL/min (A) (by C-G formula based on SCr of 3.31 mg/dL (H)).  Liver Function Tests: Recent Labs  Lab 06/20/20 1110 06/22/20 0144  AST 31 23  ALT 18 15  ALKPHOS 95 79  BILITOT 0.6 0.4  PROT 6.1* 5.3*  ALBUMIN 2.6* 2.3*    HbA1C: Hgb A1c MFr Bld  Date/Time Value Ref Range Status  12/15/2013 03:42 AM 6.4 (H) <5.7 % Final    Comment:    (NOTE)                                                                       According to the ADA Clinical Practice Recommendations for 2011, when HbA1c is used as a screening test:  >=6.5%   Diagnostic of Diabetes Mellitus           (if abnormal result is confirmed) 5.7-6.4%   Increased risk of developing Diabetes Mellitus References:Diagnosis and Classification of Diabetes Mellitus,Diabetes GYFV,4944,96(PRFFM 1):S62-S69 and Standards of Medical Care in         Diabetes - 2011,Diabetes Care,2011,34 (Suppl 1):S11-S61.      Recent Results (from the past 240 hour(s))  Resp Panel by RT-PCR (Flu A&B, Covid) Nasopharyngeal Swab     Status: None   Collection Time: 06/20/20  1:45 PM   Specimen: Nasopharyngeal Swab; Nasopharyngeal(NP) swabs in vial transport medium  Result Value Ref Range Status   SARS  Coronavirus 2 by RT PCR NEGATIVE NEGATIVE Final    Comment: (NOTE) SARS-CoV-2 target nucleic acids are NOT DETECTED.  The SARS-CoV-2 RNA is generally detectable in upper respiratory specimens during the acute phase of infection. The lowest concentration of SARS-CoV-2 viral copies this assay can detect is 138 copies/mL.  A negative result does not preclude SARS-Cov-2 infection and should not be used as the sole basis for treatment or other patient management decisions. A negative result may occur with  improper specimen collection/handling, submission of specimen other than nasopharyngeal swab, presence of viral mutation(s) within the areas targeted by this assay, and inadequate number of viral copies(<138 copies/mL). A negative result must be combined with clinical observations, patient history, and epidemiological information. The expected result is Negative.  Fact Sheet for Patients:  EntrepreneurPulse.com.au  Fact Sheet for Healthcare Providers:  IncredibleEmployment.be  This test is no t yet approved or cleared by the [place] FDA and  has been authorized for detection and/or diagnosis of SARS-CoV-2 by FDA under an Emergency Use Authorization (EUA). This EUA will remain  in effect (meaning this test can be used) for the duration of the COVID-19 declaration under Section 564(b)(1) of the Act, 21 U.S.C.section 360bbb-3(b)(1), unless the authorization is terminated  or revoked sooner.       Influenza A by PCR NEGATIVE NEGATIVE Final   Influenza B by PCR NEGATIVE NEGATIVE Final    Comment: (NOTE) The Xpert Xpress SARS-CoV-2/FLU/RSV plus assay is intended as an aid in the diagnosis of influenza from Nasopharyngeal swab specimens and should not be used as a sole basis for treatment. Nasal washings and aspirates are unacceptable for Xpert Xpress SARS-CoV-2/FLU/RSV testing.  Fact Sheet for  Patients: EntrepreneurPulse.com.au  Fact Sheet for Healthcare Providers: IncredibleEmployment.be  This test is not yet approved or cleared by the [place] FDA and has been authorized for detection and/or diagnosis of SARS-CoV-2 by FDA under an Emergency Use Authorization (EUA). This EUA will remain in effect (meaning this test can be used) for the duration of the COVID-19 declaration under Section 564(b)(1) of the Act, 21 U.S.C. section 360bbb-3(b)(1), unless the authorization is terminated or revoked.  Performed at [facility], [place] [address] [place], [place] [address]      Scheduled Meds: . furosemide  40 mg Oral BID  . hydrALAZINE  50 mg Oral TID  . isosorbide dinitrate  30 mg Oral TID  . latanoprost  1 drop Both Eyes QHS  . levothyroxine  25 mcg Oral Daily  . loratadine  10 mg Oral Daily  . metoprolol succinate  50 mg Oral Daily  . NIFEdipine  60 mg Oral QHS  . pantoprazole  40 mg Oral BID  . simvastatin  40 mg Oral QHS  . sucralfate  1 g Oral TID WC & HS  . tamsulosin  0.4 mg Oral QPC supper     LOS: 3 days   Cherene Altes, MD [facility] Office  [phone] Pager - Text Page per Amion  If 7PM-7AM, please contact night-coverage per Amion 06/24/2020, 10:55 AM

## 2020-06-25 ENCOUNTER — Inpatient Hospital Stay (HOSPITAL_COMMUNITY): Payer: Medicare Other

## 2020-06-25 DIAGNOSIS — I10 Essential (primary) hypertension: Secondary | ICD-10-CM

## 2020-06-25 DIAGNOSIS — N184 Chronic kidney disease, stage 4 (severe): Secondary | ICD-10-CM | POA: Diagnosis not present

## 2020-06-25 DIAGNOSIS — I5042 Chronic combined systolic (congestive) and diastolic (congestive) heart failure: Secondary | ICD-10-CM | POA: Diagnosis not present

## 2020-06-25 DIAGNOSIS — J90 Pleural effusion, not elsewhere classified: Secondary | ICD-10-CM

## 2020-06-25 DIAGNOSIS — N179 Acute kidney failure, unspecified: Secondary | ICD-10-CM

## 2020-06-25 DIAGNOSIS — K922 Gastrointestinal hemorrhage, unspecified: Secondary | ICD-10-CM | POA: Diagnosis not present

## 2020-06-25 HISTORY — PX: IR THORACENTESIS ASP PLEURAL SPACE W/IMG GUIDE: IMG5380

## 2020-06-25 LAB — BASIC METABOLIC PANEL
Anion gap: 8 (ref 5–15)
BUN: 77 mg/dL — ABNORMAL HIGH (ref 8–23)
CO2: 20 mmol/L — ABNORMAL LOW (ref 22–32)
Calcium: 8.4 mg/dL — ABNORMAL LOW (ref 8.9–10.3)
Chloride: 112 mmol/L — ABNORMAL HIGH (ref 98–111)
Creatinine, Ser: 3.41 mg/dL — ABNORMAL HIGH (ref 0.61–1.24)
GFR, Estimated: 17 mL/min — ABNORMAL LOW (ref >60)
Glucose, Bld: 112 mg/dL — ABNORMAL HIGH (ref 70–99)
Potassium: 4.1 mmol/L (ref 3.5–5.1)
Sodium: 140 mmol/L (ref 135–145)

## 2020-06-25 LAB — CBC
HCT: 30.3 % — ABNORMAL LOW (ref 39.0–52.0)
Hemoglobin: 9.5 g/dL — ABNORMAL LOW (ref 13.0–17.0)
MCH: 29.7 pg (ref 26.0–34.0)
MCHC: 31.4 g/dL (ref 30.0–36.0)
MCV: 94.7 fL (ref 80.0–100.0)
Platelets: 443 10*3/uL — ABNORMAL HIGH (ref 150–400)
RBC: 3.2 MIL/uL — ABNORMAL LOW (ref 4.22–5.81)
RDW: 15.6 % — ABNORMAL HIGH (ref 11.5–15.5)
WBC: 15.4 10*3/uL — ABNORMAL HIGH (ref 4.0–10.5)
nRBC: 0 % (ref 0.0–0.2)

## 2020-06-25 LAB — LACTATE DEHYDROGENASE: LDH: 181 U/L (ref 98–192)

## 2020-06-25 LAB — LACTATE DEHYDROGENASE, PLEURAL OR PERITONEAL FLUID: LD, Fluid: 62 U/L — ABNORMAL HIGH (ref 3–23)

## 2020-06-25 LAB — BODY FLUID CELL COUNT WITH DIFFERENTIAL
Eos, Fluid: 0 %
Lymphs, Fluid: 79 %
Monocyte-Macrophage-Serous Fluid: 15 % — ABNORMAL LOW (ref 50–90)
Neutrophil Count, Fluid: 6 % (ref 0–25)
Total Nucleated Cell Count, Fluid: 420 cu mm (ref 0–1000)

## 2020-06-25 LAB — GRAM STAIN

## 2020-06-25 LAB — PROTEIN, PLEURAL OR PERITONEAL FLUID: Total protein, fluid: <3 g/dL

## 2020-06-25 LAB — PROTEIN, TOTAL: Total Protein: 5.9 g/dL — ABNORMAL LOW (ref 6.5–8.1)

## 2020-06-25 LAB — AMMONIA: Ammonia: 20 umol/L (ref 9–35)

## 2020-06-25 MED ORDER — LIDOCAINE HCL (PF) 1 % IJ SOLN
INTRAMUSCULAR | Status: DC | PRN
  Administered 2020-06-25: 30 mL

## 2020-06-25 MED ORDER — LIDOCAINE HCL (PF) 1 % IJ SOLN
INTRAMUSCULAR | Status: AC
  Filled 2020-06-25: qty 30

## 2020-06-25 NOTE — Plan of Care (Signed)

## 2020-06-25 NOTE — Procedures (Signed)
PROCEDURE SUMMARY:  Successful image-guided right thoracentesis. Yielded 1.0 liters of clear yellow fluid. Patient tolerated procedure well. EBL: Zero No immediate complications.  Specimen was sent for labs. Post procedure CXR shows no pneumothorax.  Please see imaging section of Epic for full dictation.  Joaquim Nam PA-C 06/25/2020 12:38 PM

## 2020-06-25 NOTE — Consult Note (Signed)
   [facility] CM Inpatient Consult   06/25/2020  [name] 01/10/1932 [id number]  [facility] [ACO] Patient: Brian Wolf   Patient screened for hospitalization with noted extreme high risk score for unplanned readmission risk and extreme to assess for potential [facility] service needs for post hospital transition.    Plan:  Continue to follow progress and disposition to assess for post hospital care management needs.  Spoke with inpatient [facility] RNCM regarding plan for transition of care.  For questions contact:   Natividad Brood, RN BSN [facility] Liaison  [phone] business mobile phone Toll free office [phone]  Fax number: [phone] [email or web address]

## 2020-06-25 NOTE — Progress Notes (Signed)
Occupational Therapy Treatment Patient Details Name: Brian Wolf MRN: [id number] DOB: [birth year] Today's Date: 06/25/2020    History of present illness 87 y.o. male with medical history significant of HTN; HLD; dementia; CVA (2015); chronic combined CHF; OSA on CPAP; stage 4 CKD; and admission for UGI bleed in March (EGD with non-bleeding duodenal ulcer with no stigmata of bleeding) presenting with GI bleeding.   OT comments  Pt is demonstrating a decline in function this session. Per RN and Wife, pt has not been feeding himself today and when wife feeds him, pt will pocket food and requires max tactile cues to swallow some. Additionally, OT and wife assisted pt in attempting to stand x5, Max assist +2 was provided and pt was unable to stand or even engage his quads. Pt required max verbal cueing to open eyes and respond. OT provided mod assist for all bed mobility this session with max verbal and tactile encouragement to participate. RN and MD were notified of Pt's change in behaviors. Acute OT will continue to follow to address pt's goals.    Follow Up Recommendations  SNF;Supervision/Assistance - 24 hour    Equipment Recommendations  Wheelchair (measurements OT);Wheelchair cushion (measurements OT)    Recommendations for Other Services Speech consult    Precautions / Restrictions Precautions Precautions: Fall Restrictions Weight Bearing Restrictions: No       Mobility Bed Mobility Overal bed mobility: Needs Assistance Bed Mobility: Supine to Sit;Sit to Supine     Supine to sit: Mod assist Sit to supine: Mod assist   General bed mobility comments: Pt needs assistance with bringing legs into and out of bed as well as assisting with elevating trunk to sitting EOB.    Transfers                 General transfer comment: Pt was provided max assist x2 to come to standing from elevated surface, pt was unable to lift bottom off of the bed. Quads did not engage withany  attempt to stand.    Balance Overall balance assessment: Needs assistance Sitting-balance support: Bilateral upper extremity supported;Feet supported Sitting balance-[name]: Poor Sitting balance - Comments: Pt was unable to sit unsupported. Postural control: Posterior lean     Standing balance comment: Pt unable to stand at this time.                           ADL either performed or assessed with clinical judgement   ADL Overall ADL's : Needs assistance/impaired Eating/Feeding: Maximal assistance;Bed level Eating/Feeding Details (indicate cue type and reason): Pt's wife is attempting to feed him, RN and wife reporting that he is having a lot of difficulty swallowing.                     Toilet Transfer: Total assistance;+2 for physical assistance;+2 for safety/equipment;Stand-pivot Toilet Transfer Details (indicate cue type and reason): Pt was unable to stand for transfer this session, attempted x5.         Functional mobility during ADLs: Total assistance;+2 for physical assistance;+2 for safety/equipment General ADL Comments: Pt demonstrated difficulty responding to simple commands, OT and pt's wife attempted x5 to stand from an elevated bed surface, and pt was unable to assist in standing. Pt's quads did not engage with any attempts to stand.     Vision       Perception     Praxis      Cognition Arousal/Alertness: [employer] fast streamer  Behavior During Therapy: Flat affect Overall Cognitive Status: History of cognitive impairments - at baseline                                 General Comments: Pt required max encouragement to open eyes and respond. Pt had difficulty following simple commands this session, which wife reports is way different than his baseline.        Exercises     Shoulder Instructions       General Comments VSS on RA, decreased responsiveness, requires max verbal and tactile cues to participate.    Pertinent  Vitals/ Pain       Pain Assessment: Faces Faces Pain Scale: Hurts even more Pain Location: generalized/unable to state Pain Descriptors / Indicators: Grimacing;Moaning Pain Intervention(s): Limited activity within patient's tolerance;Monitored during session;Repositioned  Home Living                                          Prior Functioning/Environment              Frequency  Min 2X/week        Progress Toward Goals  OT Goals(current goals can now be found in the care plan section)  Progress towards OT goals: Not progressing toward goals - comment (Pt demonstrating decreased responsiveness this session.)  Acute Rehab OT Goals Patient Stated Goal: None stated OT Goal Formulation: With patient/family Time For Goal Achievement: 07/05/20 Potential to Achieve Goals: Good ADL Goals Pt Will Perform Grooming: with supervision;standing Pt Will Perform Upper Body Bathing: with supervision;sitting;standing Pt Will Perform Lower Body Bathing: with min guard assist;sitting/lateral leans;sit to/from stand Pt Will Perform Upper Body Dressing: with modified independence;sitting Pt Will Perform Lower Body Dressing: with min guard assist;sitting/lateral leans;sit to/from stand Pt Will Transfer to Toilet: with min guard assist;ambulating;regular height toilet;grab bars Pt Will Perform Toileting - Clothing Manipulation and hygiene: with supervision;sitting/lateral leans;sit to/from stand Pt Will Perform Tub/Shower Transfer: Tub transfer;Shower transfer;with min guard assist;ambulating;tub bench;grab bars;rolling walker Pt/caregiver will Perform Home Exercise Program: Increased strength;Both right and left upper extremity;With Supervision Additional ADL Goal #1: Pt/wife will verbalize/demonstrate 3 fall prevention strategies to incorporate into ADLs/ADL mobility with supervision-min assist overall. Additional ADL Goal #2: Pt will be Ox4 and follow 2 step commands with 100%  accuracy in order to safely participate in ADLs/ADL mobility with supervision-min assist overall.  Plan Frequency remains appropriate;Discharge plan needs to be updated    Co-evaluation                 AM-PAC OT "6 Clicks" Daily Activity     Outcome Measure   Help from another person eating meals?: A Lot Help from another person taking care of personal grooming?: A Lot Help from another person toileting, which includes using toliet, bedpan, or urinal?: Total Help from another person bathing (including washing, rinsing, drying)?: A Lot Help from another person to put on and taking off regular upper body clothing?: A Lot Help from another person to put on and taking off regular lower body clothing?: Total 6 Click Score: 10    End of Session Equipment Utilized During Treatment: Gait belt;Rolling walker  OT Visit Diagnosis: Unsteadiness on feet (R26.81);Muscle weakness (generalized) (M62.81);History of falling (Z91.81)   Activity Tolerance Patient limited by lethargy;Patient limited by fatigue   Patient Left in bed;with call  bell/phone within reach;with family/visitor present   Nurse Communication Mobility status        Time: 0122-2411 OT Time Calculation (min): 23 min  Charges: OT General Charges $OT Visit: 1 Visit OT Treatments $Self Care/Home Management : 23-37 mins  Charline Hoskinson H., OTR/L Acute Rehabilitation  Kellan Raffield Elane Yolanda Bonine 06/25/2020, 1:46 PM

## 2020-06-25 NOTE — Progress Notes (Signed)
PROGRESS NOTE    Brian Wolf  JJO:[id number] DOB: [birth year] DOA: 06/20/2020 PCP: Brian Morning, DO   Brief Narrative: Brian Wolf is a 87 y.o. male with history of hypertension, hyperlipidemia, dementia, thromboembolic CVA, chronic combined systolic and diastolic heart failure, OSA on CPAP, CKD stage IV, upper GI bleeding secondary to duodenal ulcer.  Patient presented secondary to recurrent black stools with concern for upper GI bleeding.  Gastroenterology was consulted with recommendations for no repeat endoscopies.  Recommendation to restart Protonix therapy.  While admitted, patient was noted to have pleural effusion requiring Lasix administration which is led to worsening AKI on CKD stage IV.   Assessment & Plan:   Principal Problem:   Acute upper GI bleeding Active Problems:   Benign essential HTN   Obstructive sleep apnea syndrome   Hyperkalemia   CKD (chronic kidney disease), stage IV (HCC)   DNR (do not resuscitate)   Dementia ([place])   Chronic combined systolic and diastolic CHF (congestive heart failure) (HCC)   GIB (gastrointestinal bleeding)   Upper GI bleed Melena Patient with a prior history of duodenal ulcer.  2 recent endoscopies in the past year.  GI consulted for recommendations.  No repeat endoscopy performed this admission.  Patient was not taking Protonix as an outpatient as recommended. -GI recommendations: Protonix 40 mg twice daily x6 weeks followed by 40 mg daily indefinitely  Chronic anemia Hemoglobin currently stable.  Hemoglobin of 11.9 on prior ED visit with a hemoglobin of 9.9 on admission which stabilized to around 9.  Possible chronic blood loss anemia.  In setting of above.  Stable.  Acute respiratory failure with hypoxia In setting of pleural effusion.  Patient has been managed with IV Lasix which is now transition to oral Lasix.  Persistent right pleural effusion.  Currently weaned to 1 L/min of oxygen -Wean oxygen to room air, goal  SPO2 greater than 90%  Large right pleural effusion Possibly related to recent pneumonia.  Patient required thoracentesis in March 2022.  Possibly complicating respiratory status currently.  No improvement with diuresis. -Ultrasound thoracentesis ordered with labs -LDH, protein  AKI on CKD stage IV Baseline creatinine of 2.5-2.7.  Creatinine of 2.9 on admission which is steadily increased to a current creatinine of 3.41 with IV diuresis now transition to oral.  Rate of climb appears to be slowed.  Urine output of 400 mL with 2 unmeasured occurrences over the last 24 hours. -Daily BMP -Continue Lasix oral for now -Watch urine output carefully  Chronic combined systolic and diastolic heart failure Last echo from 04/06/2020 significant for an EF of 35 to 40% with grade 1 diastolic dysfunction.  Patient is on Lasix 40 mg twice daily, metoprolol succinate 50 mg daily as an outpatient. -Continue Lasix p.o. Plan continue Toprol-XL  Hyperkalemia Potassium of 5.7 likely secondary to AKI.  Patient received 1 dose of Lokelma 10 g with improvement of potassium.  Resolved.  Hypertension -Continue hydralazine, isosorbide dinitrate, metoprolol succinate, nifedipine  Hypothyroidism -Continue Synthroid  Dementia Patient with steady decline over the last few months.  Somnolence Unsure of etiology.  While in the room, patient perked up.  Wife is concerned that patient's energy level has been decreasing steadily.  Patient able to answer questions at bedside.  Not completely oriented. -Ammonia level  BPH -Continue Flomax  Hyperlipidemia -Continue simvastatin  Glaucoma -Continue Xalatan   DVT prophylaxis: SCDs Code Status:   Code Status: DNR Family Communication: Wife at bedside Disposition Plan: Discharge pending improvement of  mental status/kidney function.   Consultants:   Gastroenterology [facility])  Procedures:   None  Antimicrobials:  None   Subjective: Per wife, patient  has been more somnolent today.  When discussing with patient, patient reports no issues.  Reports that he is not having any trouble breathing.  States he is in [facility] but he is unsure why.  Objective: Vitals:   06/23/20 2211 06/24/20 0621 06/24/20 1341 06/25/20 0431  BP: (!) 139/51 (!) 121/46 (!) 116/50 (!) 161/60  Pulse: 68 65 63 70  Resp:  16 18 20   Temp:  97.9 F (36.6 C) (!) 97.5 F (36.4 C) 97.6 F (36.4 C)  TempSrc:   Oral Oral  SpO2:  98% 100% 98%  Weight:      Height:        Intake/Output Summary (Last 24 hours) at 06/25/2020 1027 Last data filed at 06/25/2020 0910 Gross per 24 hour  Intake 580 ml  Output 750 ml  Net -170 ml   Filed Weights   06/20/20 1418  Weight: 68 kg    Examination:  General exam: Appears calm and comfortable and in no acute distress. CConversant Respiratory: diminished on auscultation. Respiratory effort normal at rest with no intercostal retractions or use of accessory muscles. Some slight dyspnea noted with speaking. Cardiovascular: S1 & S2 heard, RRR. No murmurs, rubs, gallops or clicks. No edema Gastrointestinal: Abdomen is nondistended, soft and nontender. No masses felt. Normal bowel sounds heard Neurologic: No focal neurological deficits Musculoskeletal: No calf tenderness Skin: No cyanosis. No new rashes Psychiatry: Somnolent but easily arouses and is oriented to person, place. Memory impaired. Flat affect   Data Reviewed: I have personally reviewed following labs and imaging studies  CBC Lab Results  Component Value Date   WBC 15.4 (H) 06/25/2020   RBC 3.20 (L) 06/25/2020   HGB 9.5 (L) 06/25/2020   HCT 30.3 (L) 06/25/2020   MCV 94.7 06/25/2020   MCH 29.7 06/25/2020   PLT 443 (H) 06/25/2020   MCHC 31.4 06/25/2020   RDW 15.6 (H) 06/25/2020   LYMPHSABS 4.3 (H) 06/20/2020   MONOABS 1.4 (H) 06/20/2020   EOSABS 0.6 (H) 06/20/2020   BASOSABS 0.1 60/73/7106     Last metabolic panel Lab Results  Component Value  Date   NA 140 06/25/2020   K 4.1 06/25/2020   CL 112 (H) 06/25/2020   CO2 20 (L) 06/25/2020   BUN 77 (H) 06/25/2020   CREATININE 3.41 (H) 06/25/2020   GLUCOSE 112 (H) 06/25/2020   GFRNONAA 17 (L) 06/25/2020   GFRAA 39 (L) 10/22/2019   CALCIUM 8.4 (L) 06/25/2020   PHOS 3.2 04/18/2020   PROT 5.3 (L) 06/22/2020   ALBUMIN 2.3 (L) 06/22/2020   BILITOT 0.4 06/22/2020   ALKPHOS 79 06/22/2020   AST 23 06/22/2020   ALT 15 06/22/2020   ANIONGAP 8 06/25/2020    CBG (last 3)  No results for input(s): GLUCAP in the last 72 hours.   GFR: Estimated Creatinine Clearance: 13 mL/min (A) (by C-G formula based on SCr of 3.41 mg/dL (H)).  Coagulation Profile: No results for input(s): INR, PROTIME in the last 168 hours.  Recent Results (from the past 240 hour(s))  Resp Panel by RT-PCR (Flu A&B, Covid) Nasopharyngeal Swab     Status: None   Collection Time: 06/20/20  1:45 PM   Specimen: Nasopharyngeal Swab; Nasopharyngeal(NP) swabs in vial transport medium  Result Value Ref Range Status   SARS Coronavirus 2 by RT PCR NEGATIVE  NEGATIVE Final    Comment: (NOTE) SARS-CoV-2 target nucleic acids are NOT DETECTED.  The SARS-CoV-2 RNA is generally detectable in upper respiratory specimens during the acute phase of infection. The lowest concentration of SARS-CoV-2 viral copies this assay can detect is 138 copies/mL. A negative result does not preclude SARS-Cov-2 infection and should not be used as the sole basis for treatment or other patient management decisions. A negative result may occur with  improper specimen collection/handling, submission of specimen other than nasopharyngeal swab, presence of viral mutation(s) within the areas targeted by this assay, and inadequate number of viral copies(<138 copies/mL). A negative result must be combined with clinical observations, patient history, and epidemiological information. The expected result is Negative.  Fact Sheet for Patients:   EntrepreneurPulse.com.au  Fact Sheet for Healthcare Providers:  IncredibleEmployment.be  This test is no t yet approved or cleared by the [place] FDA and  has been authorized for detection and/or diagnosis of SARS-CoV-2 by FDA under an Emergency Use Authorization (EUA). This EUA will remain  in effect (meaning this test can be used) for the duration of the COVID-19 declaration under Section 564(b)(1) of the Act, 21 U.S.C.section 360bbb-3(b)(1), unless the authorization is terminated  or revoked sooner.       Influenza A by PCR NEGATIVE NEGATIVE Final   Influenza B by PCR NEGATIVE NEGATIVE Final    Comment: (NOTE) The Xpert Xpress SARS-CoV-2/FLU/RSV plus assay is intended as an aid in the diagnosis of influenza from Nasopharyngeal swab specimens and should not be used as a sole basis for treatment. Nasal washings and aspirates are unacceptable for Xpert Xpress SARS-CoV-2/FLU/RSV testing.  Fact Sheet for Patients: EntrepreneurPulse.com.au  Fact Sheet for Healthcare Providers: IncredibleEmployment.be  This test is not yet approved or cleared by the [place] FDA and has been authorized for detection and/or diagnosis of SARS-CoV-2 by FDA under an Emergency Use Authorization (EUA). This EUA will remain in effect (meaning this test can be used) for the duration of the COVID-19 declaration under Section 564(b)(1) of the Act, 21 U.S.C. section 360bbb-3(b)(1), unless the authorization is terminated or revoked.  Performed at [facility], [place] [address], [place], [place] [address]         Radiology Studies: DG Chest Port 1 View  Result Date: 06/25/2020 CLINICAL DATA:  Pleural effusion EXAM: PORTABLE CHEST 1 VIEW COMPARISON:  06/23/2020 FINDINGS: Large right pleural effusion and small left pleural effusion are stable when compared to prior examination. Associated bibasilar compressive  atelectasis. Right apical paratracheal soft tissue density is stable from multiple prior examinations dating as far back as 12/14/2013 and is most likely related to vascular shadow. Mild cardiomegaly is stable. Pulmonary vascularity is normal. Multiple healed right rib fractures are noted. IMPRESSION: Stable large right and small left pleural effusions. Electronically Signed   By: Fidela Salisbury MD   On: 06/25/2020 06:15        Scheduled Meds: . furosemide  40 mg Oral BID  . hydrALAZINE  50 mg Oral TID  . isosorbide dinitrate  30 mg Oral TID  . latanoprost  1 drop Both Eyes QHS  . levothyroxine  25 mcg Oral Daily  . loratadine  10 mg Oral Daily  . metoprolol succinate  50 mg Oral Daily  . NIFEdipine  60 mg Oral QHS  . pantoprazole  40 mg Oral BID  . simvastatin  40 mg Oral QHS  . sucralfate  1 g Oral TID WC & HS  . tamsulosin  0.4  mg Oral QPC supper   Continuous Infusions:   LOS: 4 days     Cordelia Poche, MD [facility] 06/25/2020, 10:27 AM  If 7PM-7AM, please contact night-coverage [email or web address]

## 2020-06-26 DIAGNOSIS — I5042 Chronic combined systolic (congestive) and diastolic (congestive) heart failure: Secondary | ICD-10-CM | POA: Diagnosis not present

## 2020-06-26 DIAGNOSIS — N184 Chronic kidney disease, stage 4 (severe): Secondary | ICD-10-CM | POA: Diagnosis not present

## 2020-06-26 DIAGNOSIS — I10 Essential (primary) hypertension: Secondary | ICD-10-CM | POA: Diagnosis not present

## 2020-06-26 DIAGNOSIS — K922 Gastrointestinal hemorrhage, unspecified: Secondary | ICD-10-CM | POA: Diagnosis not present

## 2020-06-26 LAB — BLOOD GAS, ARTERIAL
Acid-base deficit: 5.1 mmol/L — ABNORMAL HIGH (ref 0.0–2.0)
Bicarbonate: 19.5 mmol/L — ABNORMAL LOW (ref 20.0–28.0)
Drawn by: 406621
FIO2: 21
O2 Saturation: 94.2 %
Patient temperature: 37.5
pCO2 arterial: 37.4 mmHg (ref 32.0–48.0)
pH, Arterial: 7.341 — ABNORMAL LOW (ref 7.350–7.450)
pO2, Arterial: 73.1 mmHg — ABNORMAL LOW (ref 83.0–108.0)

## 2020-06-26 LAB — BASIC METABOLIC PANEL
Anion gap: 8 (ref 5–15)
BUN: 74 mg/dL — ABNORMAL HIGH (ref 8–23)
CO2: 20 mmol/L — ABNORMAL LOW (ref 22–32)
Calcium: 7.8 mg/dL — ABNORMAL LOW (ref 8.9–10.3)
Chloride: 108 mmol/L (ref 98–111)
Creatinine, Ser: 3.13 mg/dL — ABNORMAL HIGH (ref 0.61–1.24)
GFR, Estimated: 19 mL/min — ABNORMAL LOW (ref >60)
Glucose, Bld: 110 mg/dL — ABNORMAL HIGH (ref 70–99)
Potassium: 4.3 mmol/L (ref 3.5–5.1)
Sodium: 136 mmol/L (ref 135–145)

## 2020-06-26 LAB — CBC
HCT: 28 % — ABNORMAL LOW (ref 39.0–52.0)
Hemoglobin: 8.7 g/dL — ABNORMAL LOW (ref 13.0–17.0)
MCH: 29.9 pg (ref 26.0–34.0)
MCHC: 31.1 g/dL (ref 30.0–36.0)
MCV: 96.2 fL (ref 80.0–100.0)
Platelets: 401 10*3/uL — ABNORMAL HIGH (ref 150–400)
RBC: 2.91 MIL/uL — ABNORMAL LOW (ref 4.22–5.81)
RDW: 15.8 % — ABNORMAL HIGH (ref 11.5–15.5)
WBC: 14.5 10*3/uL — ABNORMAL HIGH (ref 4.0–10.5)
nRBC: 0 % (ref 0.0–0.2)

## 2020-06-26 LAB — CYTOLOGY - NON PAP

## 2020-06-26 MED ORDER — PREDNISONE 20 MG PO TABS
40.0000 mg | ORAL_TABLET | Freq: Every day | ORAL | Status: DC
  Administered 2020-06-26 – 2020-06-28 (×3): 40 mg via ORAL
  Filled 2020-06-26 (×3): qty 2

## 2020-06-26 NOTE — Progress Notes (Signed)
Lab notified of ABG being sent

## 2020-06-26 NOTE — Progress Notes (Signed)
PROGRESS NOTE    Brian Wolf  YSA:[id number] DOB: [birth year] DOA: 06/20/2020 PCP: [name], DO   Brief Narrative: Brian Wolf is a 87 y.o. male with history of hypertension, hyperlipidemia, dementia, thromboembolic CVA, chronic combined systolic and diastolic heart failure, OSA on CPAP, CKD stage IV, upper GI bleeding secondary to duodenal ulcer.  Patient presented secondary to recurrent black stools with concern for upper GI bleeding.  Gastroenterology was consulted with recommendations for no repeat endoscopies.  Recommendation to restart Protonix therapy.  While admitted, patient was noted to have pleural effusion requiring Lasix administration which is led to worsening AKI on CKD stage IV.   Assessment & Plan:   Principal Problem:   Acute upper GI bleeding Active Problems:   Benign essential HTN   Obstructive sleep apnea syndrome   Hyperkalemia   CKD (chronic kidney disease), stage IV (HCC)   DNR (do not resuscitate)   Dementia ([place])   Chronic combined systolic and diastolic CHF (congestive heart failure) (HCC)   GIB (gastrointestinal bleeding)   Upper GI bleed Melena Patient with a prior history of duodenal ulcer.  2 recent endoscopies in the past year.  GI consulted for recommendations.  No repeat endoscopy performed this admission.  Patient was not taking Protonix as an outpatient as recommended. -GI recommendations: Protonix 40 mg twice daily x6 weeks followed by 40 mg daily indefinitely  Chronic anemia Hemoglobin currently stable.  Hemoglobin of 11.9 on prior ED visit with a hemoglobin of 9.9 on admission which stabilized to around 9.  Possible chronic blood loss anemia.  In setting of above.  Hemoglobin stable.  Acute respiratory failure with hypoxia In setting of pleural effusion.  Patient has been managed with IV Lasix which is now transition to oral Lasix.  Persistent right pleural effusion.  Currently weaned to 1 L/min of oxygen -Wean oxygen to  room air, goal SPO2 greater than 90%  Large right pleural effusion Possibly related to recent pneumonia.  Patient required thoracentesis in March 2022.  Possibly complicating respiratory status currently.  No improvement with diuresis. Thoracentesis ordered on 5/25 yielding about 1 L of yellow fluid. Initial labs consistent with transudate fluid. -Follow up culture/cytology  AKI on CKD stage IV Baseline creatinine of 2.5-2.7.  Creatinine of 2.9 on admission which is steadily increased to a creatinine high of 3.41 with IV diuresis now transition to oral. Creatinine down to 3.13 today. Urine output of 645 mL with 4 unmeasured occurrences over the last 24 hours. -Daily BMP -Continue Lasix -Watch urine output carefully  Chronic combined systolic and diastolic heart failure Last echo from 04/06/2020 significant for an EF of 35 to 40% with grade 1 diastolic dysfunction.  Patient is on Lasix 40 mg twice daily, metoprolol succinate 50 mg daily as an outpatient. -Continue Lasix p.o., continue Toprol-XL  Hyperkalemia Potassium of 5.7 likely secondary to AKI.  Patient received 1 dose of Lokelma 10 g with improvement of potassium.  Resolved.  Hypertension -Continue hydralazine, isosorbide dinitrate, metoprolol succinate, nifedipine  Hypothyroidism -Continue Synthroid  Dementia Patient with steady decline over the last few months.  Somnolence Possibly related to not using CPAP. Patient is not altered. Ammonia normal.  BPH -Continue Flomax  Hyperlipidemia -Continue simvastatin  Glaucoma -Continue Xalatan   DVT prophylaxis: SCDs Code Status:   Code Status: DNR Family Communication: Wife at bedside Disposition Plan: Discharge home likely in 24 hours   Consultants:   Gastroenterology [facility])  Procedures:   None  Antimicrobials:  None  Subjective: No specific issues overnight.  Wife says that patient has been sleepy.  He ate his breakfast this morning.  Patient does not  exhibit actual confusion but is just not very engaged.  Per discussion with wife, she feels like patient is agitated from being in the hospital.  Objective: Vitals:   06/25/20 1355 06/25/20 2037 06/25/20 2037 06/26/20 0523  BP: (!) 145/53  (!) 143/56 (!) 115/53  Pulse: 69  68 67  Resp: 15  17 16   Temp: (!) 97.3 F (36.3 C) 98.3 F (36.8 C) 98.3 F (36.8 C) 98.1 F (36.7 C)  TempSrc: Oral Oral Oral   SpO2: 99%  100% 100%  Weight:      Height:        Intake/Output Summary (Last 24 hours) at 06/26/2020 1233 Last data filed at 06/26/2020 0919 Gross per 24 hour  Intake 620 ml  Output 195 ml  Net 425 ml   Filed Weights   06/20/20 1418  Weight: 68 kg    Examination:  General exam: Appears calm and comfortable Respiratory system: Clear to auscultation. Respiratory effort normal. Cardiovascular system: S1 & S2 heard, RRR. No murmurs, rubs, gallops or clicks. Gastrointestinal system: Abdomen is nondistended, soft and nontender. No organomegaly or masses felt. Normal bowel sounds heard. Central nervous system: Alert and oriented. No focal neurological deficits. Musculoskeletal: Right foot edema. No calf tenderness.  Left and right first MTP joints with significant tenderness and redness. Skin: No cyanosis. No rashes Psychiatry: Judgement and insight appear normal. Mood & affect appropriate.    Data Reviewed: I have personally reviewed following labs and imaging studies  CBC Lab Results  Component Value Date   WBC 14.5 (H) 06/26/2020   RBC 2.91 (L) 06/26/2020   HGB 8.7 (L) 06/26/2020   HCT 28.0 (L) 06/26/2020   MCV 96.2 06/26/2020   MCH 29.9 06/26/2020   PLT 401 (H) 06/26/2020   MCHC 31.1 06/26/2020   RDW 15.8 (H) 06/26/2020   LYMPHSABS 4.3 (H) 06/20/2020   MONOABS 1.4 (H) 06/20/2020   EOSABS 0.6 (H) 06/20/2020   BASOSABS 0.1 25/42/7062     Last metabolic panel Lab Results  Component Value Date   NA 136 06/26/2020   K 4.3 06/26/2020   CL 108 06/26/2020   CO2 20  (L) 06/26/2020   BUN 74 (H) 06/26/2020   CREATININE 3.13 (H) 06/26/2020   GLUCOSE 110 (H) 06/26/2020   GFRNONAA 19 (L) 06/26/2020   GFRAA 39 (L) 10/22/2019   CALCIUM 7.8 (L) 06/26/2020   PHOS 3.2 04/18/2020   PROT 5.9 (L) 06/25/2020   ALBUMIN 2.3 (L) 06/22/2020   BILITOT 0.4 06/22/2020   ALKPHOS 79 06/22/2020   AST 23 06/22/2020   ALT 15 06/22/2020   ANIONGAP 8 06/26/2020    CBG (last 3)  No results for input(s): GLUCAP in the last 72 hours.   GFR: Estimated Creatinine Clearance: 14.2 mL/min (A) (by C-G formula based on SCr of 3.13 mg/dL (H)).  Coagulation Profile: No results for input(s): INR, PROTIME in the last 168 hours.  Recent Results (from the past 240 hour(s))  Resp Panel by RT-PCR (Flu A&B, Covid) Nasopharyngeal Swab     Status: None   Collection Time: 06/20/20  1:45 PM   Specimen: Nasopharyngeal Swab; Nasopharyngeal(NP) swabs in vial transport medium  Result Value Ref Range Status   SARS Coronavirus 2 by RT PCR NEGATIVE NEGATIVE Final    Comment: (NOTE) SARS-CoV-2 target nucleic acids are NOT DETECTED.  The SARS-CoV-2 RNA  is generally detectable in upper respiratory specimens during the acute phase of infection. The lowest concentration of SARS-CoV-2 viral copies this assay can detect is 138 copies/mL. A negative result does not preclude SARS-Cov-2 infection and should not be used as the sole basis for treatment or other patient management decisions. A negative result may occur with  improper specimen collection/handling, submission of specimen other than nasopharyngeal swab, presence of viral mutation(s) within the areas targeted by this assay, and inadequate number of viral copies(<138 copies/mL). A negative result must be combined with clinical observations, patient history, and epidemiological information. The expected result is Negative.  Fact Sheet for Patients:  EntrepreneurPulse.com.au  Fact Sheet for Healthcare Providers:   IncredibleEmployment.be  This test is no t yet approved or cleared by the [place] FDA and  has been authorized for detection and/or diagnosis of SARS-CoV-2 by FDA under an Emergency Use Authorization (EUA). This EUA will remain  in effect (meaning this test can be used) for the duration of the COVID-19 declaration under Section 564(b)(1) of the Act, 21 U.S.C.section 360bbb-3(b)(1), unless the authorization is terminated  or revoked sooner.       Influenza A by PCR NEGATIVE NEGATIVE Final   Influenza B by PCR NEGATIVE NEGATIVE Final    Comment: (NOTE) The Xpert Xpress SARS-CoV-2/FLU/RSV plus assay is intended as an aid in the diagnosis of influenza from Nasopharyngeal swab specimens and should not be used as a sole basis for treatment. Nasal washings and aspirates are unacceptable for Xpert Xpress SARS-CoV-2/FLU/RSV testing.  Fact Sheet for Patients: EntrepreneurPulse.com.au  Fact Sheet for Healthcare Providers: IncredibleEmployment.be  This test is not yet approved or cleared by the [place] FDA and has been authorized for detection and/or diagnosis of SARS-CoV-2 by FDA under an Emergency Use Authorization (EUA). This EUA will remain in effect (meaning this test can be used) for the duration of the COVID-19 declaration under Section 564(b)(1) of the Act, 21 U.S.C. section 360bbb-3(b)(1), unless the authorization is terminated or revoked.  Performed at [facility], [place] [address] [place], [place] [address]   Gram stain     Status: None   Collection Time: 06/25/20 12:41 PM   Specimen: Lung, Right; Pleural Fluid  Result Value Ref Range Status   Specimen Description PLEURAL FLUID  Final   Special Requests LUNG RIGHT  Final   Gram Stain   Final    WBC PRESENT,BOTH PMN AND MONONUCLEAR NO ORGANISMS SEEN CYTOSPIN SMEAR Performed at [facility], [address] [place], [place] [address]     Report Status 06/25/2020 FINAL  Final  Culture, body fluid w Gram Stain-bottle     Status: None (Preliminary result)   Collection Time: 06/25/20 12:41 PM   Specimen: Pleura  Result Value Ref Range Status   Specimen Description PLEURAL FLUID  Final   Special Requests LUNG RIGHT  Final   Culture   Final    NO GROWTH < 24 HOURS Performed at [facility], [place] [address] [place], [place] [address]    Report Status PENDING  Incomplete        Radiology Studies: DG Chest 1 View  Result Date: 06/25/2020 CLINICAL DATA:  Post thoracentesis right side. EXAM: CHEST  1 VIEW COMPARISON:  06/25/2020. FINDINGS: Mediastinum and hilar structures are unremarkable. Cardiomegaly. No pulmonary venous congestion. Interim near complete resolution of right pleural effusion following thoracentesis. No pneumothorax. Mild left base atelectasis. Small left pleural effusion cannot be excluded. No acute bony abnormality.  IMPRESSION: 1. Interim near complete resolution of right pleural effusion following thoracentesis. No pneumothorax. 2. Mild left base atelectasis. Small left pleural effusion cannot be excluded. Electronically Signed   By: Marcello Moores  Register   On: 06/25/2020 13:08   DG Chest Port 1 View  Result Date: 06/25/2020 CLINICAL DATA:  Pleural effusion EXAM: PORTABLE CHEST 1 VIEW COMPARISON:  06/23/2020 FINDINGS: Large right pleural effusion and small left pleural effusion are stable when compared to prior examination. Associated bibasilar compressive atelectasis. Right apical paratracheal soft tissue density is stable from multiple prior examinations dating as far back as 12/14/2013 and is most likely related to vascular shadow. Mild cardiomegaly is stable. Pulmonary vascularity is normal. Multiple healed right rib fractures are noted. IMPRESSION: Stable large right and small left pleural effusions. Electronically Signed   By: Fidela Salisbury MD   On: 06/25/2020 06:15   IR THORACENTESIS ASP PLEURAL SPACE  W/IMG GUIDE  Result Date: 06/25/2020 INDICATION: History of congestive heart failure, chronic kidney disease, recurrent pleural effusions. Request to IR for right diagnostic and therapeutic thoracentesis. EXAM: ULTRASOUND GUIDED RIGHT THORACENTESIS MEDICATIONS: 10 mL 1% lidocaine COMPLICATIONS: None immediate. PROCEDURE: An ultrasound guided thoracentesis was thoroughly discussed with the patient and questions answered. The benefits, risks, alternatives and complications were also discussed. The patient understands and wishes to proceed with the procedure. Written consent was obtained. Ultrasound was performed to localize and mark an adequate pocket of fluid in the right chest. The area was then prepped and draped in the normal sterile fashion. 1% Lidocaine was used for local anesthesia. Under ultrasound guidance a 6 Fr Safe-T-Centesis catheter was introduced. Thoracentesis was performed. The catheter was removed and a dressing applied. FINDINGS: A total of approximately 1.0 L of clear yellow fluid was removed. Samples were sent to the laboratory as requested by the clinical team. IMPRESSION: Successful ultrasound guided right thoracentesis yielding 1.0 L of pleural fluid. Read by Candiss Norse, PA-C Electronically Signed   By: Corrie Mckusick D.O.   On: 06/25/2020 12:40        Scheduled Meds: . furosemide  40 mg Oral BID  . hydrALAZINE  50 mg Oral TID  . isosorbide dinitrate  30 mg Oral TID  . latanoprost  1 drop Both Eyes QHS  . levothyroxine  25 mcg Oral Daily  . loratadine  10 mg Oral Daily  . metoprolol succinate  50 mg Oral Daily  . NIFEdipine  60 mg Oral QHS  . pantoprazole  40 mg Oral BID  . predniSONE  40 mg Oral Q breakfast  . simvastatin  40 mg Oral QHS  . sucralfate  1 g Oral TID WC & HS  . tamsulosin  0.4 mg Oral QPC supper   Continuous Infusions:   LOS: 5 days     Cordelia Poche, MD [facility] 06/26/2020, 12:33 PM  If 7PM-7AM, please contact  night-coverage [email or web address]

## 2020-06-26 NOTE — Plan of Care (Signed)

## 2020-06-26 NOTE — Evaluation (Signed)
Clinical/Bedside Swallow Evaluation Patient Details  Name: Derrian Poli MRN: [id number] Date of Birth: March 15, 1931  Today's Date: 06/26/2020 Time: SLP Start Time (ACUTE ONLY): 0900 SLP Stop Time (ACUTE ONLY): 0920 SLP Time Calculation (min) (ACUTE ONLY): 20 min  Past Medical History:  Past Medical History:  Diagnosis Date  . Chronic combined systolic and diastolic CHF (congestive heart failure) ([place])   . CVA (cerebral infarction) 12/2013  . Dementia ([place])   . Dyslipidemia   . Gastric ulcer    prior to 2000  . Hypertension   . OSA on CPAP   . Sleep difficulties   . Stage 4 chronic kidney disease [facility])    Past Surgical History:  Past Surgical History:  Procedure Laterality Date  . ABDOMINAL SURGERY    . BIOPSY  04/06/2020   Procedure: BIOPSY;  Surgeon: Ronald Lobo, MD;  Location: [place];  Service: Endoscopy;;  . CATARACT EXTRACTION Left   . ESOPHAGOGASTRODUODENOSCOPY N/A 10/18/2019   Procedure: ESOPHAGOGASTRODUODENOSCOPY (EGD);  Surgeon: Wonda Horner, MD;  Location: [facility] ENDOSCOPY;  Service: Endoscopy;  Laterality: N/A;  . ESOPHAGOGASTRODUODENOSCOPY (EGD) WITH PROPOFOL N/A 04/06/2020   Procedure: ESOPHAGOGASTRODUODENOSCOPY (EGD) WITH PROPOFOL;  Surgeon: Ronald Lobo, MD;  Location: [facility];  Service: Endoscopy;  Laterality: N/A;  . IR THORACENTESIS ASP PLEURAL SPACE W/IMG GUIDE  04/08/2020  . IR THORACENTESIS ASP PLEURAL SPACE W/IMG GUIDE  06/25/2020   HPI:  87yo with a history of dysphagia and dementia. Admitted with with black stools x1 week with no abdominal pain but with nausea. Pt was not taking PPI, which has been resumed. Poor oral intake and general decline reported though pt did return to self feeding and adequte Po intake in the interim. Typically takes pills without difficulty, eats regualr solids and thin liquids. Pt was admitted in March 2022, noted to have coughing. Pt underwent MBS that showed moderate [id number] also with a history of HTN, HLD, dementia,  thromboembolic CVA, chronic combined systolic and diastolic CHF, OSA on CPAP, CKD stage IV,   Assessment / Plan / Recommendation Clinical Impression  Pt and wife well known to this SLP after hospitalization in March. Pt presents similarly; when he is acutely ill he requires total assist feeding, often tries to refuse PO and keeps eyes closed while frequenty cursing at staff or his wife. Wife reports that when he is well, he feeds himself regular textures and takes his pills with assist. Today wife needed cues to reposition pt appropriately, but otherwise made good food choices (soft/puree/pudding textures) from an unrestricted diet order and demonstrated adequate methods of feeding pt. Pt needs verbal and tactile cues to open mouth for spoon and does not seal lips on spoon so wife has to drag spoon on his teeth to give the bolus. She also gives verbal and tactile cues for pt to close mouth and swallow bolus. He does better with assisted cup sips; will seal lips on cup edge. No signs of aspiration. Pt in a naturally Dinger tucked position, which has been recommended to them in the past. Though feeding is challenging for wife at this time, she is managing very well and providing adequate nutrition. Recommend pt and wife continue current diet without further SLP interventions. If further problems arise, reconsult. SLP Visit Diagnosis: Dysphagia, oropharyngeal phase (R13.12)    Aspiration Risk  Moderate aspiration risk;Risk for inadequate nutrition/hydration    Diet Recommendation Regular;Thin liquid   Liquid Administration via: Cup;Straw Medication Administration: Whole meds with puree Supervision: Full supervision/cueing for compensatory strategies;Comment (  wife feeds pt) Compensations: [place] tuck;Slow rate;Small sips/bites Postural Changes: Remain upright for at least 30 minutes after po intake    Other  Recommendations Oral Care Recommendations: Oral care BID   Follow up Recommendations         Frequency and Duration            Prognosis        Swallow Study   General HPI: 87yo with a history of dysphagia and dementia. Admitted with with black stools x1 week with no abdominal pain but with nausea. Pt was not taking PPI, which has been resumed. Poor oral intake and general decline reported though pt did return to self feeding and adequte Po intake in the interim. Typically takes pills without difficulty, eats regualr solids and thin liquids. Pt was admitted in March 2022, noted to have coughing. Pt underwent MBS that showed moderate 33129Pt also with a history of HTN, HLD, dementia, thromboembolic CVA, chronic combined systolic and diastolic CHF, OSA on CPAP, CKD stage IV, Type of Study: Bedside Swallow Evaluation Diet Prior to this Study: Regular;Thin liquids Temperature Spikes Noted: No Respiratory Status: Room air History of Recent Intubation: No Behavior/Cognition: Alert;Requires cueing Oral Cavity Assessment: Within Functional Limits Oral Care Completed by SLP: No Oral Cavity - Dentition: Adequate natural dentition Vision: Impaired for self-feeding Self-Feeding Abilities: Total assist Patient Positioning: Upright in bed Baseline Vocal Quality: Normal Volitional Cough: Cognitively unable to elicit Volitional Swallow: Able to elicit    Oral/Motor/Sensory Function Overall Oral Motor/Sensory Function: Within functional limits   Ice Chips     Thin Liquid Thin Liquid: Impaired Presentation: Cup Oral Phase Impairments: Poor awareness of bolus    Nectar Thick Nectar Thick Liquid: Not tested   Honey Thick Honey Thick Liquid: Not tested   Puree Puree: Impaired Presentation: Spoon Oral Phase Impairments: Poor awareness of bolus;Reduced labial seal   Solid     Solid: Not tested      Abrea Henle, Katherene Ponto 06/26/2020,10:49 AM

## 2020-06-27 ENCOUNTER — Inpatient Hospital Stay (HOSPITAL_COMMUNITY): Payer: Medicare Other

## 2020-06-27 DIAGNOSIS — I10 Essential (primary) hypertension: Secondary | ICD-10-CM | POA: Diagnosis not present

## 2020-06-27 DIAGNOSIS — I5042 Chronic combined systolic (congestive) and diastolic (congestive) heart failure: Secondary | ICD-10-CM | POA: Diagnosis not present

## 2020-06-27 DIAGNOSIS — N184 Chronic kidney disease, stage 4 (severe): Secondary | ICD-10-CM | POA: Diagnosis not present

## 2020-06-27 DIAGNOSIS — K922 Gastrointestinal hemorrhage, unspecified: Secondary | ICD-10-CM | POA: Diagnosis not present

## 2020-06-27 LAB — URINALYSIS, ROUTINE W REFLEX MICROSCOPIC
Bilirubin Urine: NEGATIVE
Glucose, UA: NEGATIVE mg/dL
Ketones, ur: NEGATIVE mg/dL
Leukocytes,Ua: NEGATIVE
Nitrite: NEGATIVE
Protein, ur: 100 mg/dL — AB
Specific Gravity, Urine: 1.01 (ref 1.005–1.030)
pH: 5 (ref 5.0–8.0)

## 2020-06-27 LAB — BASIC METABOLIC PANEL
Anion gap: 10 (ref 5–15)
BUN: 79 mg/dL — ABNORMAL HIGH (ref 8–23)
CO2: 20 mmol/L — ABNORMAL LOW (ref 22–32)
Calcium: 8.1 mg/dL — ABNORMAL LOW (ref 8.9–10.3)
Chloride: 109 mmol/L (ref 98–111)
Creatinine, Ser: 3.28 mg/dL — ABNORMAL HIGH (ref 0.61–1.24)
GFR, Estimated: 18 mL/min — ABNORMAL LOW (ref >60)
Glucose, Bld: 158 mg/dL — ABNORMAL HIGH (ref 70–99)
Potassium: 4.1 mmol/L (ref 3.5–5.1)
Sodium: 139 mmol/L (ref 135–145)

## 2020-06-27 LAB — IRON AND TIBC
Iron: 39 ug/dL — ABNORMAL LOW (ref 45–182)
Saturation Ratios: 23 % (ref 17.9–39.5)
TIBC: 169 ug/dL — ABNORMAL LOW (ref 250–450)
UIBC: 130 ug/dL

## 2020-06-27 LAB — TSH: TSH: 2.168 u[IU]/mL (ref 0.350–4.500)

## 2020-06-27 LAB — FERRITIN: Ferritin: 343 ng/mL — ABNORMAL HIGH (ref 24–336)

## 2020-06-27 LAB — VITAMIN B12: Vitamin B-12: 539 pg/mL (ref 180–914)

## 2020-06-27 LAB — FOLATE: Folate: 7.2 ng/mL (ref >5.9)

## 2020-06-27 LAB — SODIUM, URINE, RANDOM: Sodium, Ur: 46 mmol/L

## 2020-06-27 LAB — CREATININE, URINE, RANDOM: Creatinine, Urine: 72.81 mg/dL

## 2020-06-27 LAB — OSMOLALITY, URINE: Osmolality, Ur: 355 mOsm/kg (ref 300–900)

## 2020-06-27 NOTE — Progress Notes (Signed)
Physical Therapy Treatment Patient Details Name: Brian Wolf MRN: [id number] DOB: 1931-02-20 Today's Date: 06/27/2020    History of Present Illness 87 y.o. male with medical history significant of HTN; HLD; dementia; CVA (2015); chronic combined CHF; OSA on CPAP; stage 4 CKD; and admission for UGI bleed in March (EGD with non-bleeding duodenal ulcer with no stigmata of bleeding) presenting with GI bleeding.    PT Comments    Pt with improved mobility and able to amb short distance in hallway. Able to arouse but still more lethargic than baseline per wife.    Follow Up Recommendations  Home health PT;Supervision/Assistance - 24 hour     Equipment Recommendations  Wheelchair (measurements PT) (per wife already delivered)    Recommendations for Other Services       Precautions / Restrictions Precautions Precautions: Fall    Mobility  Bed Mobility Overal bed mobility: Needs Assistance Bed Mobility: Supine to Sit     Supine to sit: Mod assist     General bed mobility comments: Assist to move legs off of bed and to elevate trunk into sitting    Transfers Overall transfer level: Needs assistance Equipment used: None;Rolling walker (2 wheeled) Transfers: Sit to/from [facility] Sit to Stand: Min assist Stand pivot transfers: Min assist       General transfer comment: Assist to initiate and to bring hips up. Bed to [facility] with walker.  Ambulation/Gait Ambulation/Gait assistance: Min assist;+2 safety/equipment (+2 to follow with recliner to encouraged incr amb distance) Gait Distance (Feet): 60 Feet Assistive device: Rolling walker (2 wheeled) Gait Pattern/deviations: Step-through pattern;Decreased stride length;Decreased step length - right;Decreased step length - left;Trunk flexed;Wide base of support Gait velocity: decreased Gait velocity interpretation: <1.31 ft/sec, indicative of household ambulator General Gait Details: Assist for balance. Verbal  cues to open eyes   Stairs             Wheelchair Mobility    Modified Rankin (Stroke Patients Only)       Balance Overall balance assessment: Needs assistance Sitting-balance support: Feet supported;No upper extremity supported Sitting balance-[name]: Fair     Standing balance support: Bilateral upper extremity supported;During functional activity Standing balance-[name]: Poor Standing balance comment: walker and min guard for static standing                            Cognition Arousal/Alertness: Lethargic Behavior During Therapy: Flat affect Overall Cognitive Status: History of cognitive impairments - at baseline                                 General Comments: Able to arouse to stimuli. Becomes irritated at times. Kept eyes closed most of treatment but does respond to questions and commands. Followed 1 step commands with incr time and continued encouragement      Exercises      General Comments        Pertinent Vitals/Pain Pain Assessment: No/denies pain    Home Living                      Prior Function            PT Goals (current goals can now be found in the care plan section) Acute Rehab PT Goals Patient Stated Goal: None stated Progress towards PT goals: Progressing toward goals    Frequency  Min 3X/week      PT Plan Current plan remains appropriate    Co-evaluation              AM-PAC PT "6 Clicks" Mobility   Outcome Measure  Help needed turning from your back to your side while in a flat bed without using bedrails?: A Little Help needed moving from lying on your back to sitting on the side of a flat bed without using bedrails?: A Lot Help needed moving to and from a bed to a chair (including a wheelchair)?: A Little Help needed standing up from a chair using your arms (e.g., wheelchair or bedside chair)?: A Little Help needed to walk in hospital room?: A Little Help needed  climbing 3-5 steps with a railing? : A Lot 6 Click Score: 16    End of Session Equipment Utilized During Treatment: Gait belt Activity Tolerance: Patient tolerated treatment well Patient left: with call bell/phone within reach;with family/visitor present;in chair;with chair alarm set Nurse Communication: Mobility status;Other (comment) (Pt with BM) PT Visit Diagnosis: Difficulty in walking, not elsewhere classified (R26.2);Muscle weakness (generalized) (M62.81)     Time: 1470-9295 PT Time Calculation (min) (ACUTE ONLY): 26 min  Charges:  $Gait Training: 23-37 mins                     [place] Pager [phone] Office [place] 06/27/2020, 11:11 AM

## 2020-06-27 NOTE — Progress Notes (Addendum)
PROGRESS NOTE    Brian Wolf  VVO:[id number] DOB: 11-27-1931 DOA: 06/20/2020 PCP: [name], DO   Brief Narrative: Brian Wolf is a 87 y.o. male with history of hypertension, hyperlipidemia, dementia, thromboembolic CVA, chronic combined systolic and diastolic heart failure, OSA on CPAP, CKD stage IV, upper GI bleeding secondary to duodenal ulcer.  Patient presented secondary to recurrent black stools with concern for upper GI bleeding.  Gastroenterology was consulted with recommendations for no repeat endoscopies.  Recommendation to restart Protonix therapy.  While admitted, patient was noted to have pleural effusion requiring Lasix administration which is led to worsening AKI on CKD stage IV.   Assessment & Plan:   Principal Problem:   Acute upper GI bleeding Active Problems:   Benign essential HTN   Obstructive sleep apnea syndrome   Hyperkalemia   CKD (chronic kidney disease), stage IV (HCC)   DNR (do not resuscitate)   Dementia ([place])   Chronic combined systolic and diastolic CHF (congestive heart failure) (H[facility])   GIB (gastrointestinal bleeding)   Upper GI bleed [place] Patient with a prior history of duodenal ulcer.  2 recent endoscopies in the past year.  GI consulted for recommendations.  No repeat endoscopy performed this admission.  Patient was not taking Protonix as an outpatient as recommended. -GI recommendations: Protonix 40 mg twice daily x6 weeks followed by 40 mg daily indefinitely  Chronic anemia Hemoglobin currently stable.  Hemoglobin of 11.9 on prior ED visit with a hemoglobin of 9.9 on admission which stabilized to around 9.  Possible chronic blood loss anemia.  In setting of above.  Hemoglobin stable.  Acute respiratory failure with hypoxia In setting of pleural effusion.  Patient has been managed with IV Lasix which is now transition to oral Lasix.  Persistent right pleural effusion. Weaned to room air.  Large right pleural  effusion Possibly related to recent pneumonia.  Patient required thoracentesis in March 2022.  Possibly complicating respiratory status currently.  No improvement with diuresis. Thoracentesis ordered on 5/25 yielding about 1 L of yellow fluid. Initial labs consistent with transudate fluid. Cultures with no growth to date. -Follow up culture/cytology  AKI on CKD stage IV Baseline creatinine of 2.5-2.7.  Creatinine of 2.9 on admission which is steadily increased to a creatinine high of 3.41 with IV diuresis now transition to oral. Creatinine stabilized now at 3.28. Urine output of 500 mL with 2 unmeasured occurrences over the last 24 hours. -Daily BMP -Hold Lasix -Watch urine output carefully -Nephrology consult for management and prognostication   Chronic combined systolic and diastolic heart failure Last echo from 04/06/2020 significant for an EF of 35 to 40% with grade 1 diastolic dysfunction.  Patient is on Lasix 40 mg twice daily, metoprolol succinate 50 mg daily as an outpatient. -Continue Lasix p.o., continue Toprol-XL  Hyperkalemia Potassium of 5.7 likely secondary to AKI.  Patient received 1 dose of Lokelma 10 g with improvement of potassium.  Resolved.  Hypertension -Continue hydralazine, isosorbide dinitrate, metoprolol succinate, nifedipine  Hypothyroidism -Continue Synthroid  Dementia Patient with steady decline over the last few months. Likely contributing to current presentation.  Somnolence Concern patient is declining medically/physically. Wife is also concerned that he is nearing end of life -Palliative care consult -CT head, B12, folate, thiamine  BPH -Continue Flomax  Hyperlipidemia -Continue simvastatin  Glaucoma -Continue Xalatan  Likely gout Started on prednisone with improvement. -Continue prednisone to complete 3 days since quick response   DVT prophylaxis: SCDs Code Status:  Code Status: DNR Family Communication: Wife at bedside Disposition  Plan: Discharge home depending goals of care, continued workup of somnolence, nephrology recs. Patient may end up transitioning to hospice level of care depending on workup.   Consultants:   Gastroenterology [facility])  Procedures:   None  Antimicrobials:  None   Subjective: Patient continues to be somnolent and withdrawn. Wife is concerned he may be nearing end of life. She is aware this is a real possibility and understands.   Objective: Vitals:   06/26/20 1800 06/26/20 2139 06/27/20 0406 06/27/20 1341  BP:  (!) 154/60 (!) 141/60 (!) 149/56  Pulse:  62 70 (!) 55  Resp:  19 18 17   Temp:  98.4 F (36.9 C) (!) 97.5 F (36.4 C) 98.1 F (36.7 C)  TempSrc:  Oral Axillary Axillary  SpO2: 96% 97% 97% 99%  Weight:      Height:        Intake/Output Summary (Last 24 hours) at 06/27/2020 1549 Last data filed at 06/27/2020 1300 Gross per 24 hour  Intake 420 ml  Output 250 ml  Net 170 ml   Filed Weights   06/20/20 1418  Weight: 68 kg    Examination:  General exam: Appears calm and comfortable Respiratory system: Clear to auscultation. Respiratory effort normal. Cardiovascular system: S1 & S2 heard, RRR. No murmurs, rubs, gallops or clicks. Gastrointestinal system: Abdomen is nondistended, soft and nontender. No organomegaly or masses felt. Normal bowel sounds heard. Central nervous system: Somnolent but easily arouses. Oriented to person and place.  Musculoskeletal: No edema. No calf tenderness Skin: No cyanosis. No rashes Psychiatry: Blunt affect   Data Reviewed: I have personally reviewed following labs and imaging studies  CBC Lab Results  Component Value Date   WBC 14.5 (H) 06/26/2020   RBC 2.91 (L) 06/26/2020   HGB 8.7 (L) 06/26/2020   HCT 28.0 (L) 06/26/2020   MCV 96.2 06/26/2020   MCH 29.9 06/26/2020   PLT 401 (H) 06/26/2020   MCHC 31.1 06/26/2020   RDW 15.8 (H) 06/26/2020   LYMPHSABS 4.3 (H) 06/20/2020   MONOABS 1.4 (H) 06/20/2020   EOSABS 0.6 (H)  06/20/2020   BASOSABS 0.1 71/24/5809     Last metabolic panel Lab Results  Component Value Date   NA 139 06/27/2020   K 4.1 06/27/2020   CL 109 06/27/2020   CO2 20 (L) 06/27/2020   BUN 79 (H) 06/27/2020   CREATININE 3.28 (H) 06/27/2020   GLUCOSE 158 (H) 06/27/2020   GFRNONAA 18 (L) 06/27/2020   GFRAA 39 (L) 10/22/2019   CALCIUM 8.1 (L) 06/27/2020   PHOS 3.2 04/18/2020   PROT 5.9 (L) 06/25/2020   ALBUMIN 2.3 (L) 06/22/2020   BILITOT 0.4 06/22/2020   ALKPHOS 79 06/22/2020   AST 23 06/22/2020   ALT 15 06/22/2020   ANIONGAP 10 06/27/2020    CBG (last 3)  No results for input(s): GLUCAP in the last 72 hours.   GFR: Estimated Creatinine Clearance: 13.5 mL/min (A) (by C-G formula based on SCr of 3.28 mg/dL (H)).  Coagulation Profile: No results for input(s): INR, PROTIME in the last 168 hours.  Recent Results (from the past 240 hour(s))  Resp Panel by RT-PCR (Flu A&B, Covid) Nasopharyngeal Swab     Status: None   Collection Time: 06/20/20  1:45 PM   Specimen: Nasopharyngeal Swab; Nasopharyngeal(NP) swabs in vial transport medium  Result Value Ref Range Status   SARS Coronavirus 2 by RT PCR NEGATIVE NEGATIVE Final  Comment: (NOTE) SARS-CoV-2 target nucleic acids are NOT DETECTED.  The SARS-CoV-2 RNA is generally detectable in upper respiratory specimens during the acute phase of infection. The lowest concentration of SARS-CoV-2 viral copies this assay can detect is 138 copies/mL. A negative result does not preclude SARS-Cov-2 infection and should not be used as the sole basis for treatment or other patient management decisions. A negative result may occur with  improper specimen collection/handling, submission of specimen other than nasopharyngeal swab, presence of viral mutation(s) within the areas targeted by this assay, and inadequate number of viral copies(<138 copies/mL). A negative result must be combined with clinical observations, patient history, and  epidemiological information. The expected result is Negative.  Fact Sheet for Patients:  EntrepreneurPulse.com.au  Fact Sheet for Healthcare Providers:  IncredibleEmployment.be  This test is no t yet approved or cleared by the [place] FDA and  has been authorized for detection and/or diagnosis of SARS-CoV-2 by FDA under an Emergency Use Authorization (EUA). This EUA will remain  in effect (meaning this test can be used) for the duration of the COVID-19 declaration under Section 564(b)(1) of the Act, 21 U.S.C.section 360bbb-3(b)(1), unless the authorization is terminated  or revoked sooner.       Influenza A by PCR NEGATIVE NEGATIVE Final   Influenza B by PCR NEGATIVE NEGATIVE Final    Comment: (NOTE) The Xpert Xpress SARS-CoV-2/FLU/RSV plus assay is intended as an aid in the diagnosis of influenza from Nasopharyngeal swab specimens and should not be used as a sole basis for treatment. Nasal washings and aspirates are unacceptable for Xpert Xpress SARS-CoV-2/FLU/RSV testing.  Fact Sheet for Patients: EntrepreneurPulse.com.au  Fact Sheet for Healthcare Providers: IncredibleEmployment.be  This test is not yet approved or cleared by the [place] FDA and has been authorized for detection and/or diagnosis of SARS-CoV-2 by FDA under an Emergency Use Authorization (EUA). This EUA will remain in effect (meaning this test can be used) for the duration of the COVID-19 declaration under Section 564(b)(1) of the Act, 21 U.S.C. section 360bbb-3(b)(1), unless the authorization is terminated or revoked.  Performed at [facility], [place] [address] [place], [place] [address]   Gram stain     Status: None   Collection Time: 06/25/20 12:41 PM   Specimen: Lung, Right; Pleural Fluid  Result Value Ref Range Status   Specimen Description PLEURAL FLUID  Final   Special Requests LUNG RIGHT  Final   Gram  Stain   Final    WBC PRESENT,BOTH PMN AND MONONUCLEAR NO ORGANISMS SEEN CYTOSPIN SMEAR Performed at [facility], [address], [place], [place] [address]    Report Status 06/25/2020 FINAL  Final  Culture, body fluid w Gram Stain-bottle     Status: None (Preliminary result)   Collection Time: 06/25/20 12:41 PM   Specimen: Pleura  Result Value Ref Range Status   Specimen Description PLEURAL FLUID  Final   Special Requests LUNG RIGHT  Final   Culture   Final    NO GROWTH 2 DAYS Performed at [facility] [address] [place], [place] [address]    Report Status PENDING  Incomplete        Radiology Studies: No results found.      Scheduled Meds: . furosemide  40 mg Oral BID  . hydrALAZINE  50 mg Oral TID  . isosorbide dinitrate  30 mg Oral TID  . latanoprost  1 drop Both Eyes QHS  . levothyroxine  25 mcg Oral  Daily  . loratadine  10 mg Oral Daily  . metoprolol succinate  50 mg Oral Daily  . NIFEdipine  60 mg Oral QHS  . pantoprazole  40 mg Oral BID  . predniSONE  40 mg Oral Q breakfast  . simvastatin  40 mg Oral QHS  . sucralfate  1 g Oral TID WC & HS  . tamsulosin  0.4 mg Oral QPC supper   Continuous Infusions:   LOS: 6 days     Cordelia Poche, MD [facility] 06/27/2020, 3:49 PM  If 7PM-7AM, please contact night-coverage [email or web address]

## 2020-06-27 NOTE — Progress Notes (Signed)
OT Cancellation Note  Patient Details Name: Nilo Fallin MRN: [id number] DOB: 11-12-1931   Cancelled Treatment:    Reason Eval/Treat Not Completed: Medical issues which prohibited therapy. Pt having catheter placed. OT checked back x2, RN reported they were having difficulty placing catheter. OT will follow up as time allows.   Dema Timmons H., OTR/L Acute Rehabilitation  Cristy Colmenares Elane Yolanda Bonine 06/27/2020, 4:57 PM

## 2020-06-27 NOTE — TOC Progression Note (Signed)
Transition of Care [facility]) - Progression Note    Patient Details  Name: Jaggar Benko MRN: [id number] Date of Birth: 05/08/1931  [facility] CM/SW Contact  Jacalyn Lefevre Edson Snowball, RN Phone Number: 06/27/2020, 9:33 AM  Clinical Narrative:     Possible discharge over the weekend , Amy with [facility] updated.   Patient does NOT have home oxygen currently on oxygen. If needed at home will need oxygen saturation note and order.  Expected Discharge Plan: [place] Barriers to Discharge: Continued Medical Work up  Expected Discharge Plan and Services Expected Discharge Plan: [place] arrangements for the past 2 months: Single Family Home                 DME Arranged: Wheelchair manual DME Agency: [employer] Date DME Agency Contacted: 06/23/20 Time DME Agency Contacted: 82 Representative spoke with at DME Agency: [place]: PT,OT [place]: [place] Date [place]: 06/23/20 Time [place]: 29 Representative spoke with at [place]: [place] ([place]) Interventions    Readmission Risk Interventions Readmission Risk Prevention Plan 05/16/2020  Transportation Screening Complete  PCP or Specialist Appt within 3-5 Days Complete  HRI or [place] Complete  Social Work Consult for [place] Planning/Counseling Complete  Palliative Care Screening Not Applicable  Medication Review Press photographer) Complete  Some recent data might be hidden

## 2020-06-27 NOTE — Consult Note (Addendum)
[facility] Renal Consultation Note  Requesting MD: Cordelia Poche, MD Indication for Consultation:  AKI   Chief complaint: black stools  HPI:  Brian Wolf is a 87 y.o. male with history including HTN, dementia, chronic combined systolic and diastolic CHF, stage IV CKD who presented with black stools.  Also has had weakness.  Spoke with his wife who provided history.  He had to cancel appt with nephrologist at [facility] earlier this year due to hospitalization.  She states that he has been more confused since Tuesday - dementia normally more mild. Note had a large right pleural effusion s/p thoracentesis this admission.  Earlier got IV diuretics then was transitioned to orals.  500 ml uop and two unmeasured voids.  They are going to in/out cath for a urine sample shortly.  Had foley at one point they state and was removed.   Creatinine, Ser  Date/Time Value Ref Range Status  06/27/2020 12:39 AM 3.28 (H) 0.61 - 1.24 mg/dL Final  06/26/2020 01:16 AM 3.13 (H) 0.61 - 1.24 mg/dL Final  06/25/2020 03:33 AM 3.41 (H) 0.61 - 1.24 mg/dL Final  06/24/2020 07:56 AM 3.31 (H) 0.61 - 1.24 mg/dL Final  06/23/2020 01:17 AM 2.86 (H) 0.61 - 1.24 mg/dL Final  06/22/2020 01:44 AM 2.65 (H) 0.61 - 1.24 mg/dL Final  06/21/2020 01:12 AM 2.75 (H) 0.61 - 1.24 mg/dL Final  06/20/2020 11:10 AM 3.22 (H) 0.61 - 1.24 mg/dL Final  06/15/2020 10:21 AM 2.59 (H) 0.61 - 1.24 mg/dL Final  05/16/2020 02:26 AM 2.90 (H) 0.61 - 1.24 mg/dL Final  05/15/2020 03:55 AM 2.94 (H) 0.61 - 1.24 mg/dL Final  05/14/2020 05:50 AM 2.58 (H) 0.61 - 1.24 mg/dL Final  05/13/2020 10:56 AM 2.63 (H) 0.61 - 1.24 mg/dL Final  04/18/2020 04:07 AM 2.54 (H) 0.61 - 1.24 mg/dL Final  04/17/2020 02:37 AM 2.62 (H) 0.61 - 1.24 mg/dL Final  04/16/2020 06:10 AM 2.64 (H) 0.61 - 1.24 mg/dL Final  04/15/2020 03:41 AM 2.63 (H) 0.61 - 1.24 mg/dL Final  04/14/2020 03:53 AM 2.78 (H) 0.61 - 1.24 mg/dL Final  04/13/2020 07:38 AM 2.83 (H) 0.61  - 1.24 mg/dL Final  04/12/2020 03:30 AM 3.15 (H) 0.61 - 1.24 mg/dL Final  04/11/2020 02:33 AM 3.42 (H) 0.61 - 1.24 mg/dL Final  04/10/2020 03:48 AM 3.75 (H) 0.61 - 1.24 mg/dL Final  04/09/2020 02:51 AM 3.67 (H) 0.61 - 1.24 mg/dL Final  04/08/2020 01:44 AM 3.21 (H) 0.61 - 1.24 mg/dL Final  04/07/2020 02:52 AM 2.96 (H) 0.61 - 1.24 mg/dL Final  04/06/2020 06:42 AM 2.77 (H) 0.61 - 1.24 mg/dL Final  04/05/2020 07:29 AM 2.63 (H) 0.61 - 1.24 mg/dL Final  10/22/2019 01:22 AM 1.79 (H) 0.61 - 1.24 mg/dL Final  10/21/2019 05:47 AM 2.06 (H) 0.61 - 1.24 mg/dL Final  10/20/2019 05:33 AM 1.88 (H) 0.61 - 1.24 mg/dL Final  10/19/2019 02:07 AM 2.02 (H) 0.61 - 1.24 mg/dL Final  10/18/2019 10:33 AM 2.20 (H) 0.61 - 1.24 mg/dL Final  10/18/2019 04:00 AM 2.20 (H) 0.61 - 1.24 mg/dL Final  10/17/2019 03:50 PM 2.09 (H) 0.61 - 1.24 mg/dL Final  10/17/2019 09:08 AM 2.20 (H) 0.61 - 1.24 mg/dL Final  10/17/2019 09:03 AM 2.12 (H) 0.61 - 1.24 mg/dL Final  06/26/2019 06:34 PM 1.87 (H) 0.61 - 1.24 mg/dL Final  03/10/2016 04:24 AM 1.32 (H) 0.61 - 1.24 mg/dL Final  03/09/2016 05:12 AM 1.42 (H) 0.61 - 1.24 mg/dL Final  03/08/2016 11:03 PM 1.45 (H) 0.61 - 1.24  mg/dL Final  12/21/2013 10:26 AM 0.96 0.50 - 1.35 mg/dL Final  12/20/2013 06:37 AM 0.88 0.50 - 1.35 mg/dL Final  12/19/2013 07:44 AM 0.82 0.50 - 1.35 mg/dL Final  12/17/2013 05:45 AM 0.86 0.50 - 1.35 mg/dL Final  12/16/2013 04:00 AM 0.96 0.50 - 1.35 mg/dL Final  12/15/2013 03:42 AM 1.02 0.50 - 1.35 mg/dL Final  12/14/2013 03:50 PM 0.88 0.50 - 1.35 mg/dL Final  12/14/2013 11:37 AM 0.92 0.50 - 1.35 mg/dL Final     PMHx:   Past Medical History:  Diagnosis Date  . Chronic combined systolic and diastolic CHF (congestive heart failure) ([place])   . CVA (cerebral infarction) 12/2013  . Dementia ([name])   . Dyslipidemia   . Gastric ulcer    prior to 2000  . Hypertension   . OSA on CPAP   . Sleep difficulties   . Stage 4 chronic kidney disease [facility])     Past  Surgical History:  Procedure Laterality Date  . ABDOMINAL SURGERY    . BIOPSY  04/06/2020   Procedure: BIOPSY;  Surgeon: Ronald Lobo, MD;  Location: [facility];  Service: Endoscopy;;  . CATARACT EXTRACTION Left   . ESOPHAGOGASTRODUODENOSCOPY N/A 10/18/2019   Procedure: ESOPHAGOGASTRODUODENOSCOPY (EGD);  Surgeon: Wonda Horner, MD;  Location: [facility] ENDOSCOPY;  Service: Endoscopy;  Laterality: N/A;  . ESOPHAGOGASTRODUODENOSCOPY (EGD) WITH PROPOFOL N/A 04/06/2020   Procedure: ESOPHAGOGASTRODUODENOSCOPY (EGD) WITH PROPOFOL;  Surgeon: Ronald Lobo, MD;  Location: [place];  Service: Endoscopy;  Laterality: N/A;  . IR THORACENTESIS ASP PLEURAL SPACE W/IMG GUIDE  04/08/2020  . IR THORACENTESIS ASP PLEURAL SPACE W/IMG GUIDE  06/25/2020    Family Hx:  Family History  Problem Relation Age of Onset  . Hyperlipidemia Mother   . Hypertension Mother   . Hypertension Father   no family hx of CKD  Social History:  reports that he has never smoked. He has never used smokeless tobacco. He reports that he does not drink alcohol and does not use drugs.  Allergies:  Allergies  Allergen Reactions  . Lisinopril Cough  . Nsaids Other (See Comments)    GI bleed(s)     Medications: Prior to Admission medications   Medication Sig Start Date End Date Taking? Authorizing Provider  Cholecalciferol 50 MCG (2000 UT) TABS Take 2,000 Units by mouth daily. 05/28/20   [provider]  fexofenadine (ALLEGRA) 180 MG tablet Take 180 mg by mouth in the morning.    [provider]  furosemide (LASIX) 40 MG tablet Take 1 tablet (40 mg total) by mouth 2 (two) times daily. 05/16/20 06/15/20  Amin, Jeanella Flattery, MD  hydrALAZINE (APRESOLINE) 50 MG tablet Take 1 tablet (50 mg total) by mouth 3 (three) times daily. 11/21/19   Cantwell, Celeste C, PA-C  isosorbide dinitrate (ISORDIL) 30 MG tablet Take 1 tablet (30 mg total) by mouth 3 (three) times daily. 09/03/19   Adrian Prows, MD  levothyroxine (SYNTHROID)  25 MCG tablet Take 25 mcg by mouth See admin instructions. Take by mouth every morning before a meal 05/27/20   [provider]  metoprolol succinate (TOPROL-XL) 50 MG 24 hr tablet Take 50 mg by mouth daily.    [provider]  mupirocin ointment (BACTROBAN) 2 % Apply 1 application topically daily as needed for irritation. 05/27/20   [provider]  NIFEdipine (ADALAT CC) 60 MG 24 hr tablet Take 60 mg by mouth at bedtime.    [provider]  ondansetron (ZOFRAN ODT) 4 MG disintegrating tablet  Take 1 tablet (4 mg total) by mouth every 8 (eight) hours as needed for nausea or vomiting. 06/15/20   Margarita Mail, PA-C  pantoprazole (PROTONIX) 40 MG tablet Take 1 tablet (40 mg total) by mouth 2 (two) times daily before a meal. Patient not taking: Reported on 06/15/2020 10/22/19   Shawna Clamp, MD  potassium chloride SA (KLOR-CON) 20 MEQ tablet Take 1 tablet (20 mEq total) by mouth 2 (two) times daily. 05/16/20 06/15/20  Damita Lack, MD  Probiotic Product (PROBIOTIC DAILY PO) Take 1 capsule by mouth in the morning.    [provider]  simvastatin (ZOCOR) 40 MG tablet Take 40 mg by mouth at bedtime. 11/30/13   [provider]  tamsulosin (FLOMAX) 0.4 MG CAPS capsule Take 0.4 mg by mouth daily after supper. 06/03/16   [provider]  Travoprost, BAK Free, (TRAVATAN) 0.004 % SOLN ophthalmic solution Place 1 drop into both eyes every evening.    [provider]    I have reviewed the patient's current and reported prior to admission medications.  Labs:  BMP Latest Ref Rng & Units 06/27/2020 06/26/2020 06/25/2020  Glucose 70 - 99 mg/dL 158(H) 110(H) 112(H)  BUN 8 - 23 mg/dL 79(H) 74(H) 77(H)  Creatinine 0.61 - 1.24 mg/dL 3.28(H) 3.13(H) 3.41(H)  Sodium 135 - 145 mmol/L 139 136 140  Potassium 3.5 - 5.1 mmol/L 4.1 4.3 4.1  Chloride 98 - 111 mmol/L 109 108 112(H)  CO2 22 - 32 mmol/L 20(L) 20(L) 20(L)  Calcium 8.9 - 10.3 mg/dL 8.1(L)  7.8(L) 8.4(L)    Urinalysis    Component Value Date/Time   COLORURINE YELLOW 06/15/2020 1001   APPEARANCEUR HAZY (A) 06/15/2020 1001   LABSPEC 1.013 06/15/2020 1001   PHURINE 5.0 06/15/2020 1001   GLUCOSEU 50 (A) 06/15/2020 1001   HGBUR NEGATIVE 06/15/2020 1001   BILIRUBINUR NEGATIVE 06/15/2020 1001   KETONESUR NEGATIVE 06/15/2020 1001   PROTEINUR >=300 (A) 06/15/2020 1001   UROBILINOGEN 0.2 12/16/2013 0929   NITRITE NEGATIVE 06/15/2020 1001   LEUKOCYTESUR NEGATIVE 06/15/2020 1001     ROS:  Unable to perform 2/2 ams/dementia  Physical Exam: Vitals:   06/27/20 0406 06/27/20 1341  BP: (!) 141/60 (!) 149/56  Pulse: 70 (!) 55  Resp: 18 17  Temp: (!) 97.5 F (36.4 C) 98.1 F (36.7 C)  SpO2: 97% 99%     General: elderly male in bed in NAD  HEENT: NCAT Eyes: eyes closed  Neck: supple trachea midline Heart: S1S2 no rub Lungs: clear and unlabored on room air Abdomen: soft/nt/nd Extremities: pedal edema bilaterally; no leg edema Skin: no rash on extremities exposed Psych no anxiety or agitation Neuro: patient oriented to self and identified his wife as "my wife" after we asked him four times.  Does not know year.   Assessment/Plan:  # CKD stage IV - recent pre-renal insults with GI bleed. Ensure not retaining. - variable baseline recently in the mid to high 2's -low 3's   - pause diuretics for now and reassess tomorrow  - on flomax and getting an in/out cath for UA  - Check PVR and place foley if indicated  # Chronic combined systolic and diastolic CHF  - no overt overload - pause diuretics today and reassess tomorrow; pedal edema and on room air, unlabored.  Note hx of pleural effusion so avoid prolonged time off.  IV diuretics initially and had been transitioned to oral diuretics.    # HTN  - acceptable control   #  Dementia  - noted patient has health care agents - hx obtained from his wife  # GI bleed - GI was consulted and has recommended protonix    #  Anemia acute blood loss  - no acute indication for PRBC's  - update iron stores  Claudia Desanctis 06/27/2020, 4:38 PM

## 2020-06-28 DIAGNOSIS — I10 Essential (primary) hypertension: Secondary | ICD-10-CM | POA: Diagnosis not present

## 2020-06-28 DIAGNOSIS — I5042 Chronic combined systolic (congestive) and diastolic (congestive) heart failure: Secondary | ICD-10-CM | POA: Diagnosis not present

## 2020-06-28 DIAGNOSIS — K922 Gastrointestinal hemorrhage, unspecified: Secondary | ICD-10-CM | POA: Diagnosis not present

## 2020-06-28 DIAGNOSIS — N184 Chronic kidney disease, stage 4 (severe): Secondary | ICD-10-CM | POA: Diagnosis not present

## 2020-06-28 LAB — BASIC METABOLIC PANEL
Anion gap: 7 (ref 5–15)
BUN: 83 mg/dL — ABNORMAL HIGH (ref 8–23)
CO2: 21 mmol/L — ABNORMAL LOW (ref 22–32)
Calcium: 8.4 mg/dL — ABNORMAL LOW (ref 8.9–10.3)
Chloride: 110 mmol/L (ref 98–111)
Creatinine, Ser: 3.26 mg/dL — ABNORMAL HIGH (ref 0.61–1.24)
GFR, Estimated: 18 mL/min — ABNORMAL LOW (ref >60)
Glucose, Bld: 138 mg/dL — ABNORMAL HIGH (ref 70–99)
Potassium: 3.6 mmol/L (ref 3.5–5.1)
Sodium: 138 mmol/L (ref 135–145)

## 2020-06-28 LAB — CBC
HCT: 27.5 % — ABNORMAL LOW (ref 39.0–52.0)
Hemoglobin: 9.1 g/dL — ABNORMAL LOW (ref 13.0–17.0)
MCH: 30.3 pg (ref 26.0–34.0)
MCHC: 33.1 g/dL (ref 30.0–36.0)
MCV: 91.7 fL (ref 80.0–100.0)
Platelets: 446 10*3/uL — ABNORMAL HIGH (ref 150–400)
RBC: 3 MIL/uL — ABNORMAL LOW (ref 4.22–5.81)
RDW: 15.4 % (ref 11.5–15.5)
WBC: 17.5 10*3/uL — ABNORMAL HIGH (ref 4.0–10.5)
nRBC: 0 % (ref 0.0–0.2)

## 2020-06-28 LAB — GLUCOSE, CAPILLARY: Glucose-Capillary: 119 mg/dL — ABNORMAL HIGH (ref 70–99)

## 2020-06-28 MED ORDER — SODIUM CHLORIDE 0.9 % IV SOLN
510.0000 mg | Freq: Once | INTRAVENOUS | Status: AC
  Administered 2020-06-28: 510 mg via INTRAVENOUS
  Filled 2020-06-28: qty 17

## 2020-06-28 MED ORDER — HYDRALAZINE HCL 10 MG PO TABS
10.0000 mg | ORAL_TABLET | Freq: Once | ORAL | Status: AC
  Administered 2020-06-28: 10 mg via ORAL
  Filled 2020-06-28: qty 1

## 2020-06-28 NOTE — Progress Notes (Signed)
PROGRESS NOTE    Brian Wolf  UMP:[id number] DOB: 1931-10-27 DOA: 06/20/2020 PCP: [name], DO   Brief Narrative: Brian Wolf is a 87 y.o. male with history of hypertension, hyperlipidemia, dementia, thromboembolic CVA, chronic combined systolic and diastolic heart failure, OSA on CPAP, CKD stage IV, upper GI bleeding secondary to duodenal ulcer.  Patient presented secondary to recurrent black stools with concern for upper GI bleeding.  Gastroenterology was consulted with recommendations for no repeat endoscopies.  Recommendation to restart Protonix therapy.  While admitted, patient was noted to have pleural effusion requiring Lasix administration which is led to worsening AKI on CKD stage IV.   Assessment & Plan:   Principal Problem:   Acute upper GI bleeding Active Problems:   Benign essential HTN   Obstructive sleep apnea syndrome   Hyperkalemia   CKD (chronic kidney disease), stage IV (HCC)   DNR (do not resuscitate)   Dementia ([place])   Chronic combined systolic and diastolic CHF (congestive heart failure) (H[facility])   GIB (gastrointestinal bleeding)   Upper GI bleed Melena Patient with a prior history of duodenal ulcer.  2 recent endoscopies in the past year.  GI consulted for recommendations.  No repeat endoscopy performed this admission.  Patient was not taking Protonix as an outpatient as recommended. -GI recommendations: Protonix 40 mg twice daily x6 weeks followed by 40 mg daily indefinitely  Chronic anemia Hemoglobin currently stable.  Hemoglobin of 11.9 on prior ED visit with a hemoglobin of 9.9 on admission which stabilized to around 9.  Possible chronic blood loss anemia.  In setting of above.  Hemoglobin stable.  Acute respiratory failure with hypoxia In setting of pleural effusion.  Patient has been managed with IV Lasix which is now transition to oral Lasix.  Persistent right pleural effusion. Weaned to room air.  Large right pleural  effusion Possibly related to recent pneumonia.  Patient required thoracentesis in March 2022.  Possibly complicating respiratory status currently.  No improvement with diuresis. Thoracentesis ordered on 5/25 yielding about 1 L of yellow fluid. Initial labs consistent with transudate fluid. Cultures with no growth to date. -Follow up culture/cytology  AKI on CKD stage IV Baseline creatinine of 2.5-2.7.  Creatinine of 2.9 on admission which is steadily increased to a creatinine high of 3.41 with IV diuresis now transition to oral. Creatinine stabilized now at 3.28. Urine output of 500 mL with 2 unmeasured occurrences over the last 24 hours. -Daily BMP -Watch urine output carefully -Nephrology recommendations: hold lasix, foley ordered  Chronic combined systolic and diastolic heart failure Last echo from 04/06/2020 significant for an EF of 35 to 40% with grade 1 diastolic dysfunction.  Patient is on Lasix 40 mg twice daily, metoprolol succinate 50 mg daily as an outpatient. -Continue Toprol-XL  Hyperkalemia Potassium of 5.7 likely secondary to AKI.  Patient received 1 dose of Lokelma 10 g with improvement of potassium.  Resolved.  Hypertension -Continue hydralazine, isosorbide dinitrate, metoprolol succinate, nifedipine  Hypothyroidism -Continue Synthroid  Dementia Patient with steady decline over the last few months. Likely contributing to current presentation.  Somnolence Concern patient is declining medically/physically. Wife is also concerned that he is nearing end of life Workup so far is negative with non-acute head CT, normal B12, folate, TSH. Prior ABG without hypercarbia. His mental status is actually improved today, however and this may be a situation of delirium secondary to underlying dementia. -Palliative care consult  BPH -Continue Flomax  Hyperlipidemia -Continue simvastatin  Glaucoma -Continue Xalatan  Likely gout Started on prednisone with improvement. -Complete  prednisone burst today   DVT prophylaxis: SCDs Code Status:   Code Status: DNR Family Communication: Wife at bedside Disposition Plan: Discharge home depending goals of care, continued workup of somnolence, nephrology recs. Patient may end up transitioning to hospice level of care depending on workup.   Consultants:   Gastroenterology [facility])  Nephrology  Palliative care medicine  Procedures:   None  Antimicrobials:  None   Subjective: No issues overnight. Ate a little bit per wife. Mentation is better this morning.  Objective: Vitals:   06/27/20 1341 06/27/20 2109 06/28/20 0432 06/28/20 0958  BP: (!) 149/56 (!) 154/61 (!) 136/55 (!) 107/49  Pulse: (!) 55 66 64   Resp: 17 18 19    Temp: 98.1 F (36.7 C) (!) 97.3 F (36.3 C) 97.6 F (36.4 C)   TempSrc: Axillary Oral Oral   SpO2: 99% 96% 98%   Weight:   64.4 kg   Height:        Intake/Output Summary (Last 24 hours) at 06/28/2020 1322 Last data filed at 06/27/2020 1630 Gross per 24 hour  Intake --  Output 450 ml  Net -450 ml   Filed Weights   06/20/20 1418 06/28/20 0432  Weight: 68 kg 64.4 kg    Examination:  General exam: Appears calm and comfortable Respiratory system: Clear to auscultation. Respiratory effort normal. Cardiovascular system: S1 & S2 heard, RRR. No murmurs, rubs, gallops or clicks. Gastrointestinal system: Abdomen is nondistended, soft and nontender. No organomegaly or masses felt. Normal bowel sounds heard. Central nervous system: Alert and oriented to person, place. No focal neurological deficits. Musculoskeletal: No edema. No calf tenderness Skin: No cyanosis. No rashes Psychiatry: Judgement and insight appear impaired. Memory is impaired. Affect is blunt.   Data Reviewed: I have personally reviewed following labs and imaging studies  CBC Lab Results  Component Value Date   WBC 17.5 (H) 06/28/2020   RBC 3.00 (L) 06/28/2020   HGB 9.1 (L) 06/28/2020   HCT 27.5 (L) 06/28/2020    MCV 91.7 06/28/2020   MCH 30.3 06/28/2020   PLT 446 (H) 06/28/2020   MCHC 33.1 06/28/2020   RDW 15.4 06/28/2020   LYMPHSABS 4.3 (H) 06/20/2020   MONOABS 1.4 (H) 06/20/2020   EOSABS 0.6 (H) 06/20/2020   BASOSABS 0.1 62/37/6283     Last metabolic panel Lab Results  Component Value Date   NA 138 06/28/2020   K 3.6 06/28/2020   CL 110 06/28/2020   CO2 21 (L) 06/28/2020   BUN 83 (H) 06/28/2020   CREATININE 3.26 (H) 06/28/2020   GLUCOSE 138 (H) 06/28/2020   GFRNONAA 18 (L) 06/28/2020   GFRAA 39 (L) 10/22/2019   CALCIUM 8.4 (L) 06/28/2020   PHOS 3.2 04/18/2020   PROT 5.9 (L) 06/25/2020   ALBUMIN 2.3 (L) 06/22/2020   BILITOT 0.4 06/22/2020   ALKPHOS 79 06/22/2020   AST 23 06/22/2020   ALT 15 06/22/2020   ANIONGAP 7 06/28/2020    CBG (last 3)  Recent Labs    06/28/20 0903  GLUCAP 119*     GFR: Estimated Creatinine Clearance: 13.6 mL/min (A) (by C-G formula based on SCr of 3.26 mg/dL (H)).  Coagulation Profile: No results for input(s): INR, PROTIME in the last 168 hours.  Recent Results (from the past 240 hour(s))  Resp Panel by RT-PCR (Flu A&B, Covid) Nasopharyngeal Swab     Status: None   Collection Time: 06/20/20  1:45 PM   Specimen:  Nasopharyngeal Swab; Nasopharyngeal(NP) swabs in vial transport medium  Result Value Ref Range Status   SARS Coronavirus 2 by RT PCR NEGATIVE NEGATIVE Final    Comment: (NOTE) SARS-CoV-2 target nucleic acids are NOT DETECTED.  The SARS-CoV-2 RNA is generally detectable in upper respiratory specimens during the acute phase of infection. The lowest concentration of SARS-CoV-2 viral copies this assay can detect is 138 copies/mL. A negative result does not preclude SARS-Cov-2 infection and should not be used as the sole basis for treatment or other patient management decisions. A negative result may occur with  improper specimen collection/handling, submission of specimen other than nasopharyngeal swab, presence of viral  mutation(s) within the areas targeted by this assay, and inadequate number of viral copies(<138 copies/mL). A negative result must be combined with clinical observations, patient history, and epidemiological information. The expected result is Negative.  Fact Sheet for Patients:  EntrepreneurPulse.com.au  Fact Sheet for Healthcare Providers:  IncredibleEmployment.be  This test is no t yet approved or cleared by the [place] FDA and  has been authorized for detection and/or diagnosis of SARS-CoV-2 by FDA under an Emergency Use Authorization (EUA). This EUA will remain  in effect (meaning this test can be used) for the duration of the COVID-19 declaration under Section 564(b)(1) of the Act, 21 U.S.C.section 360bbb-3(b)(1), unless the authorization is terminated  or revoked sooner.       Influenza A by PCR NEGATIVE NEGATIVE Final   Influenza B by PCR NEGATIVE NEGATIVE Final    Comment: (NOTE) The Xpert Xpress SARS-CoV-2/FLU/RSV plus assay is intended as an aid in the diagnosis of influenza from Nasopharyngeal swab specimens and should not be used as a sole basis for treatment. Nasal washings and aspirates are unacceptable for Xpert Xpress SARS-CoV-2/FLU/RSV testing.  Fact Sheet for Patients: EntrepreneurPulse.com.au  Fact Sheet for Healthcare Providers: IncredibleEmployment.be  This test is not yet approved or cleared by the [place] FDA and has been authorized for detection and/or diagnosis of SARS-CoV-2 by FDA under an Emergency Use Authorization (EUA). This EUA will remain in effect (meaning this test can be used) for the duration of the COVID-19 declaration under Section 564(b)(1) of the Act, 21 U.S.C. section 360bbb-3(b)(1), unless the authorization is terminated or revoked.  Performed at [facility], [place] [address] [place], [place] [address]   Gram stain     Status: None    Collection Time: 06/25/20 12:41 PM   Specimen: Lung, Right; Pleural Fluid  Result Value Ref Range Status   Specimen Description PLEURAL FLUID  Final   Special Requests LUNG RIGHT  Final   Gram Stain   Final    WBC PRESENT,BOTH PMN AND MONONUCLEAR NO ORGANISMS SEEN CYTOSPIN SMEAR Performed at [facility], [address], [place], [place] [address]    Report Status 06/25/2020 FINAL  Final  Culture, body fluid w Gram Stain-bottle     Status: None (Preliminary result)   Collection Time: 06/25/20 12:41 PM   Specimen: Pleura  Result Value Ref Range Status   Specimen Description PLEURAL FLUID  Final   Special Requests LUNG RIGHT  Final   Culture   Final    NO GROWTH 3 DAYS Performed at [address] [place], [place] [address]    Report Status PENDING  Incomplete        Radiology Studies: CT HEAD WO CONTRAST  Result Date: 06/27/2020 CLINICAL DATA:  Mental status change. EXAM: CT HEAD WITHOUT CONTRAST TECHNIQUE: Contiguous axial  images were obtained from the base of the skull through the vertex without intravenous contrast. COMPARISON:  CT head Jun 27, 2020 FINDINGS: Brain: No evidence of acute large vascular territory infarction, hemorrhage, hydrocephalus, extra-axial collection or mass lesion/mass effect. Similar right posterior temporal encephalomalacia. No substantial change in extensive patchy hypodensity within the white matter, likely related to chronic microvascular ischemic disease. Similar moderate generalized cerebral atrophy. Vascular: Calcific atherosclerosis. No hyperdense vessel identified. Skull: No acute fracture. Sinuses/Orbits: Left frontal osteoma and mild ethmoid air cell mucosal thickening. No acute orbital abnormality. Other: Small left mastoid effusion. IMPRESSION: 1. No evidence of acute intracranial abnormality. 2. Extensive chronic microvascular ischemic disease and similar right temporal encephalomalacia. 3. Small left mastoid effusion.  Electronically Signed   By: Margaretha Sheffield MD   On: 06/27/2020 21:07        Scheduled Meds: . hydrALAZINE  50 mg Oral TID  . isosorbide dinitrate  30 mg Oral TID  . latanoprost  1 drop Both Eyes QHS  . levothyroxine  25 mcg Oral Daily  . loratadine  10 mg Oral Daily  . metoprolol succinate  50 mg Oral Daily  . NIFEdipine  60 mg Oral QHS  . pantoprazole  40 mg Oral BID  . predniSONE  40 mg Oral Q breakfast  . simvastatin  40 mg Oral QHS  . sucralfate  1 g Oral TID WC & HS  . tamsulosin  0.4 mg Oral QPC supper   Continuous Infusions:   LOS: 7 days     Cordelia Poche, MD [facility] 06/28/2020, 1:22 PM  If 7PM-7AM, please contact night-coverage [email or web address]

## 2020-06-28 NOTE — Progress Notes (Signed)
[facility] Progress Note  Name: Brian Wolf MRN: [id number] DOB: Nov 17, 1931  Chief Complaint:  Presented with black stools  Subjective:  he had 450 ml and one unmeasured void over 5/27.  (Straight cath with 450 ml.)  Spoke with his wife at bedside.  He was much sleepier this am but he is awake and more alert now.  He doesn't know the year and his wife states he normally wouldn't know that  Review of systems:  Denies n/v No chest pain or shortness of breath  ----------- Background on consult:  Brian Wolf is a 87 y.o. male with history including HTN, dementia, chronic combined systolic and diastolic CHF, stage IV CKD who presented with black stools.  Also has had weakness.  Spoke with his wife who provided history.  He had to cancel appt with nephrologist at [facility] earlier this year due to hospitalization.  She states that he has been more confused since Tuesday - dementia normally more mild. Note had a large right pleural effusion s/p thoracentesis this admission.  Earlier got IV diuretics then was transitioned to orals.  500 ml uop and two unmeasured voids.  They are going to in/out cath for a urine sample shortly.  Had foley at one point they state and was removed.   Intake/Output Summary (Last 24 hours) at 06/28/2020 1342 Last data filed at 06/28/2020 0900 Gross per 24 hour  Intake 120 ml  Output 450 ml  Net -330 ml    Vitals:  Vitals:   06/27/20 1341 06/27/20 2109 06/28/20 0432 06/28/20 0958  BP: (!) 149/56 (!) 154/61 (!) 136/55 (!) 107/49  Pulse: (!) 55 66 64   Resp: 17 18 19    Temp: 98.1 F (36.7 C) (!) 97.3 F (36.3 C) 97.6 F (36.4 C)   TempSrc: Axillary Oral Oral   SpO2: 99% 96% 98%   Weight:   64.4 kg   Height:         Physical Exam:  General elderly male in bed in no acute distress HEENT normocephalic atraumatic Neck supple trachea midline Lungs clear to auscultation bilaterally normal work of breathing at rest  Heart S1S2  no rub Abdomen soft nontender nondistended Extremities right pedal edema otherwise no lower extremity edema appreciated Psych calm mood and affect Neuro awake on arrival; more interactive today for me.  Oriented to person and location of cone.  Doesn't know year.  gu no foley   Medications reviewed   Labs:  BMP Latest Ref Rng & Units 06/28/2020 06/27/2020 06/26/2020  Glucose 70 - 99 mg/dL 138(H) 158(H) 110(H)  BUN 8 - 23 mg/dL 83(H) 79(H) 74(H)  Creatinine 0.61 - 1.24 mg/dL 3.26(H) 3.28(H) 3.13(H)  Sodium 135 - 145 mmol/L 138 139 136  Potassium 3.5 - 5.1 mmol/L 3.6 4.1 4.3  Chloride 98 - 111 mmol/L 110 109 108  CO2 22 - 32 mmol/L 21(L) 20(L) 20(L)  Calcium 8.9 - 10.3 mg/dL 8.4(L) 8.1(L) 7.8(L)     Assessment/Plan:   # CKD stage IV - recent pre-renal insults with GI bleed.  Urinary retention contributing as well.  Variable baseline recently in the mid to high 2's -low 3's.  UA (in/out cath per report) with 100 mg/dL protein and 11-20 RBC. Had straight cath with 450 ml on 5/27 - paused lasix for now - euvolemic on exam on room air    - UA above - place foley   # Chronic combined systolic and diastolic CHF  - no overt overload -  pause diuretics today and reassess tomorrow; pedal edema and on room air, unlabored.  Note hx of pleural effusion so avoid prolonged time off.  IV diuretics initially and had been transitioned to oral diuretics.    # HTN  - acceptable control   # Dementia  - noted patient has health care agents  # GI bleed - GI was consulted and has recommended protonix   - note is on prednisone - per primary team discretion   # Anemia acute blood loss  - no acute indication for PRBC's  - feraheme IV once on 5/28  Claudia Desanctis, MD 06/28/2020 2:02 PM

## 2020-06-29 DIAGNOSIS — I10 Essential (primary) hypertension: Secondary | ICD-10-CM | POA: Diagnosis not present

## 2020-06-29 DIAGNOSIS — Z515 Encounter for palliative care: Secondary | ICD-10-CM

## 2020-06-29 DIAGNOSIS — I5042 Chronic combined systolic (congestive) and diastolic (congestive) heart failure: Secondary | ICD-10-CM | POA: Diagnosis not present

## 2020-06-29 DIAGNOSIS — Z7189 Other specified counseling: Secondary | ICD-10-CM | POA: Diagnosis not present

## 2020-06-29 DIAGNOSIS — K922 Gastrointestinal hemorrhage, unspecified: Secondary | ICD-10-CM | POA: Diagnosis not present

## 2020-06-29 DIAGNOSIS — Z66 Do not resuscitate: Secondary | ICD-10-CM | POA: Diagnosis not present

## 2020-06-29 DIAGNOSIS — N184 Chronic kidney disease, stage 4 (severe): Secondary | ICD-10-CM | POA: Diagnosis not present

## 2020-06-29 LAB — CBC
HCT: 28.6 % — ABNORMAL LOW (ref 39.0–52.0)
Hemoglobin: 8.9 g/dL — ABNORMAL LOW (ref 13.0–17.0)
MCH: 29.3 pg (ref 26.0–34.0)
MCHC: 31.1 g/dL (ref 30.0–36.0)
MCV: 94.1 fL (ref 80.0–100.0)
Platelets: 508 10*3/uL — ABNORMAL HIGH (ref 150–400)
RBC: 3.04 MIL/uL — ABNORMAL LOW (ref 4.22–5.81)
RDW: 15.6 % — ABNORMAL HIGH (ref 11.5–15.5)
WBC: 16.9 10*3/uL — ABNORMAL HIGH (ref 4.0–10.5)
nRBC: 0.1 % (ref 0.0–0.2)

## 2020-06-29 LAB — BASIC METABOLIC PANEL
Anion gap: 8 (ref 5–15)
BUN: 90 mg/dL — ABNORMAL HIGH (ref 8–23)
CO2: 21 mmol/L — ABNORMAL LOW (ref 22–32)
Calcium: 8.1 mg/dL — ABNORMAL LOW (ref 8.9–10.3)
Chloride: 108 mmol/L (ref 98–111)
Creatinine, Ser: 3.16 mg/dL — ABNORMAL HIGH (ref 0.61–1.24)
GFR, Estimated: 18 mL/min — ABNORMAL LOW (ref >60)
Glucose, Bld: 119 mg/dL — ABNORMAL HIGH (ref 70–99)
Potassium: 3.7 mmol/L (ref 3.5–5.1)
Sodium: 137 mmol/L (ref 135–145)

## 2020-06-29 MED ORDER — CHLORHEXIDINE GLUCONATE CLOTH 2 % EX PADS
6.0000 | MEDICATED_PAD | Freq: Every day | CUTANEOUS | Status: DC
  Administered 2020-06-29 – 2020-07-01 (×3): 6 via TOPICAL

## 2020-06-29 MED ORDER — DARBEPOETIN ALFA 40 MCG/0.4ML IJ SOSY
40.0000 ug | PREFILLED_SYRINGE | Freq: Once | INTRAMUSCULAR | Status: DC
  Filled 2020-06-29: qty 0.4

## 2020-06-29 MED ORDER — FUROSEMIDE 40 MG PO TABS
60.0000 mg | ORAL_TABLET | Freq: Every day | ORAL | Status: DC
  Administered 2020-06-29 – 2020-07-01 (×3): 60 mg via ORAL
  Filled 2020-06-29 (×3): qty 1

## 2020-06-29 NOTE — Progress Notes (Signed)
PROGRESS NOTE    Brian Wolf  JJH:[id number] DOB: [birth year] DOA: 06/20/2020 PCP: [name], DO   Brief Narrative: Brian Wolf is a 87 y.o. male with history of hypertension, hyperlipidemia, dementia, thromboembolic CVA, chronic combined systolic and diastolic heart failure, OSA on CPAP, CKD stage IV, upper GI bleeding secondary to duodenal ulcer.  Patient presented secondary to recurrent black stools with concern for upper GI bleeding.  Gastroenterology was consulted with recommendations for no repeat endoscopies.  Recommendation to restart Protonix therapy.  While admitted, patient was noted to have pleural effusion requiring Lasix administration which is led to worsening AKI on CKD stage IV.   Assessment & Plan:   Principal Problem:   Acute upper GI bleeding Active Problems:   Benign essential HTN   Obstructive sleep apnea syndrome   Hyperkalemia   CKD (chronic kidney disease), stage IV (HCC)   DNR (do not resuscitate)   Dementia ([place])   Chronic combined systolic and diastolic CHF (congestive heart failure) (HCC)   GIB (gastrointestinal bleeding)   Upper GI bleed Melena Patient with a prior history of duodenal ulcer.  2 recent endoscopies in the past year.  GI consulted for recommendations.  No repeat endoscopy performed this admission.  Patient was not taking Protonix as an outpatient as recommended. -GI recommendations: Protonix 40 mg twice daily x6 weeks followed by 40 mg daily indefinitely  Chronic anemia Hemoglobin currently stable.  Hemoglobin of 11.9 on prior ED visit with a hemoglobin of 9.9 on admission which stabilized to around 9.  Possible chronic blood loss anemia.  In setting of above.  Hemoglobin stable.  Acute respiratory failure with hypoxia In setting of pleural effusion.  Patient has been managed with IV Lasix which is now transition to oral Lasix.  Persistent right pleural effusion. Weaned to room air.  Large right pleural  effusion Possibly related to recent pneumonia.  Patient required thoracentesis in March 2022.  Possibly complicating respiratory status currently.  No improvement with diuresis. Thoracentesis ordered on 5/25 yielding about 1 L of yellow fluid. Initial labs consistent with transudate fluid. Cultures with no growth to date. -Follow up culture/cytology  AKI on CKD stage IV Baseline creatinine of 2.5-2.7.  Creatinine of 2.9 on admission which is steadily increased to a creatinine high of 3.41 with IV diuresis now transition to oral. Creatinine stabilized now at 3.28. Urine output of 500 mL with 2 unmeasured occurrences over the last 24 hours. -Daily BMP -Watch urine output carefully -Nephrology recommendations: hold lasix, foley ordered  Leukocytosis Stable. Slightly worsened after steroids.  Chronic combined systolic and diastolic heart failure Last echo from 04/06/2020 significant for an EF of 35 to 40% with grade 1 diastolic dysfunction.  Patient is on Lasix 40 mg twice daily, metoprolol succinate 50 mg daily as an outpatient. -Continue Toprol-XL  Hyperkalemia Potassium of 5.7 likely secondary to AKI.  Patient received 1 dose of Lokelma 10 g with improvement of potassium.  Resolved.  Hypertension -Continue hydralazine, isosorbide dinitrate, metoprolol succinate, nifedipine  Hypothyroidism -Continue Synthroid  Dementia Patient with steady decline over the last few months. Likely contributing to current presentation.  Somnolence Concern patient is declining medically/physically. Wife is also concerned that he is nearing end of life Workup so far is negative with non-acute head CT, normal B12, folate, TSH. Prior ABG without hypercarbia. His mental status is actually improved today, however and this may be a situation of delirium secondary to underlying dementia. -Palliative care consulted  BPH -Continue Flomax  Hyperlipidemia -Continue simvastatin  Glaucoma -Continue  Xalatan  Likely gout Started on prednisone with improvement and completion of treatment.   DVT prophylaxis: SCDs Code Status:   Code Status: DNR Family Communication: Wife at bedside Disposition Plan: Discharge home likely in 1-2 days pending nephrology recs and overal mental stability   Consultants:   Gastroenterology [facility])  Nephrology  Palliative care medicine  Procedures:   None  Antimicrobials:  None   Subjective: No concerns today. Wife thinks mentation is much improved.  Objective: Vitals:   06/28/20 1448 06/28/20 2040 06/29/20 0338 06/29/20 0500  BP: (!) 119/52 133/60 (!) 122/44   Pulse: 62 62 (!) 56   Resp: 16 (!) 21 18   Temp: 98 F (36.7 C) 98.7 F (37.1 C) 97.8 F (36.6 C)   TempSrc: Oral Oral Oral   SpO2: 97% 98% 98%   Weight:    65.6 kg  Height:        Intake/Output Summary (Last 24 hours) at 06/29/2020 1244 Last data filed at 06/29/2020 0802 Gross per 24 hour  Intake 565 ml  Output 450 ml  Net 115 ml   Filed Weights   06/20/20 1418 06/28/20 0432 06/29/20 0500  Weight: 68 kg 64.4 kg 65.6 kg    Examination:  General exam: Appears calm and comfortable Respiratory system: Clear to auscultation. Respiratory effort normal. Cardiovascular system: S1 & S2 heard, RRR. No murmurs, rubs, gallops or clicks. Gastrointestinal system: Abdomen is nondistended, soft and nontender. No organomegaly or masses felt. Normal bowel sounds heard. Central nervous system: Alert and oriented to person and place. Musculoskeletal: No edema. No calf tenderness Skin: No cyanosis. No rashes Psychiatry: Judgement and insight appear impaired. Memory impaired. Blunt affect   Data Reviewed: I have personally reviewed following labs and imaging studies  CBC Lab Results  Component Value Date   WBC 16.9 (H) 06/29/2020   RBC 3.04 (L) 06/29/2020   HGB 8.9 (L) 06/29/2020   HCT 28.6 (L) 06/29/2020   MCV 94.1 06/29/2020   MCH 29.3 06/29/2020   PLT 508 (H)  06/29/2020   MCHC 31.1 06/29/2020   RDW 15.6 (H) 06/29/2020   LYMPHSABS 4.3 (H) 06/20/2020   MONOABS 1.4 (H) 06/20/2020   EOSABS 0.6 (H) 06/20/2020   BASOSABS 0.1 99/24/2683     Last metabolic panel Lab Results  Component Value Date   NA 137 06/29/2020   K 3.7 06/29/2020   CL 108 06/29/2020   CO2 21 (L) 06/29/2020   BUN 90 (H) 06/29/2020   CREATININE 3.16 (H) 06/29/2020   GLUCOSE 119 (H) 06/29/2020   GFRNONAA 18 (L) 06/29/2020   GFRAA 39 (L) 10/22/2019   CALCIUM 8.1 (L) 06/29/2020   PHOS 3.2 04/18/2020   PROT 5.9 (L) 06/25/2020   ALBUMIN 2.3 (L) 06/22/2020   BILITOT 0.4 06/22/2020   ALKPHOS 79 06/22/2020   AST 23 06/22/2020   ALT 15 06/22/2020   ANIONGAP 8 06/29/2020    CBG (last 3)  Recent Labs    06/28/20 0903  GLUCAP 119*     GFR: Estimated Creatinine Clearance: 14.1 mL/min (A) (by C-G formula based on SCr of 3.16 mg/dL (H)).  Coagulation Profile: No results for input(s): INR, PROTIME in the last 168 hours.  Recent Results (from the past 240 hour(s))  Resp Panel by RT-PCR (Flu A&B, Covid) Nasopharyngeal Swab     Status: None   Collection Time: 06/20/20  1:45 PM   Specimen: Nasopharyngeal Swab; Nasopharyngeal(NP) swabs in vial transport medium  Result  Value Ref Range Status   SARS Coronavirus 2 by RT PCR NEGATIVE NEGATIVE Final    Comment: (NOTE) SARS-CoV-2 target nucleic acids are NOT DETECTED.  The SARS-CoV-2 RNA is generally detectable in upper respiratory specimens during the acute phase of infection. The lowest concentration of SARS-CoV-2 viral copies this assay can detect is 138 copies/mL. A negative result does not preclude SARS-Cov-2 infection and should not be used as the sole basis for treatment or other patient management decisions. A negative result may occur with  improper specimen collection/handling, submission of specimen other than nasopharyngeal swab, presence of viral mutation(s) within the areas targeted by this assay, and  inadequate number of viral copies(<138 copies/mL). A negative result must be combined with clinical observations, patient history, and epidemiological information. The expected result is Negative.  Fact Sheet for Patients:  EntrepreneurPulse.com.au  Fact Sheet for Healthcare Providers:  IncredibleEmployment.be  This test is no t yet approved or cleared by the [place] FDA and  has been authorized for detection and/or diagnosis of SARS-CoV-2 by FDA under an Emergency Use Authorization (EUA). This EUA will remain  in effect (meaning this test can be used) for the duration of the COVID-19 declaration under Section 564(b)(1) of the Act, 21 U.S.C.section 360bbb-3(b)(1), unless the authorization is terminated  or revoked sooner.       Influenza A by PCR NEGATIVE NEGATIVE Final   Influenza B by PCR NEGATIVE NEGATIVE Final    Comment: (NOTE) The Xpert Xpress SARS-CoV-2/FLU/RSV plus assay is intended as an aid in the diagnosis of influenza from Nasopharyngeal swab specimens and should not be used as a sole basis for treatment. Nasal washings and aspirates are unacceptable for Xpert Xpress SARS-CoV-2/FLU/RSV testing.  Fact Sheet for Patients: EntrepreneurPulse.com.au  Fact Sheet for Healthcare Providers: IncredibleEmployment.be  This test is not yet approved or cleared by the [place] FDA and has been authorized for detection and/or diagnosis of SARS-CoV-2 by FDA under an Emergency Use Authorization (EUA). This EUA will remain in effect (meaning this test can be used) for the duration of the COVID-19 declaration under Section 564(b)(1) of the Act, 21 U.S.C. section 360bbb-3(b)(1), unless the authorization is terminated or revoked.  Performed at [facility], [place] [address] [place], [place] [address]   Gram stain     Status: None   Collection Time: 06/25/20 12:41 PM   Specimen: Lung, Right;  Pleural Fluid  Result Value Ref Range Status   Specimen Description PLEURAL FLUID  Final   Special Requests LUNG RIGHT  Final   Gram Stain   Final    WBC PRESENT,BOTH PMN AND MONONUCLEAR NO ORGANISMS SEEN CYTOSPIN SMEAR Performed at [facility], [address] [place], [place] [address]    Report Status 06/25/2020 FINAL  Final  Culture, body fluid w Gram Stain-bottle     Status: None (Preliminary result)   Collection Time: 06/25/20 12:41 PM   Specimen: Pleura  Result Value Ref Range Status   Specimen Description PLEURAL FLUID  Final   Special Requests LUNG RIGHT  Final   Culture   Final    NO GROWTH 4 DAYS Performed at [place] [address], [place], [place] [address]    Report Status PENDING  Incomplete  Culture, Urine     Status: None (Preliminary result)   Collection Time: 06/27/20  1:59 PM   Specimen: Urine, Clean Catch  Result Value Ref Range Status   Specimen Description URINE, CLEAN CATCH  Final  Special Requests NONE  Final   Culture   Final    CULTURE REINCUBATED FOR BETTER GROWTH Performed at [facility], [place] [address], [place], [place] [address]    Report Status PENDING  Incomplete        Radiology Studies: CT HEAD WO CONTRAST  Result Date: 06/27/2020 CLINICAL DATA:  Mental status change. EXAM: CT HEAD WITHOUT CONTRAST TECHNIQUE: Contiguous axial images were obtained from the base of the skull through the vertex without intravenous contrast. COMPARISON:  CT head Jun 27, 2020 FINDINGS: Brain: No evidence of acute large vascular territory infarction, hemorrhage, hydrocephalus, extra-axial collection or mass lesion/mass effect. Similar right posterior temporal encephalomalacia. No substantial change in extensive patchy hypodensity within the white matter, likely related to chronic microvascular ischemic disease. Similar moderate generalized cerebral atrophy. Vascular: Calcific atherosclerosis. No hyperdense vessel identified. Skull: No  acute fracture. Sinuses/Orbits: Left frontal osteoma and mild ethmoid air cell mucosal thickening. No acute orbital abnormality. Other: Small left mastoid effusion. IMPRESSION: 1. No evidence of acute intracranial abnormality. 2. Extensive chronic microvascular ischemic disease and similar right temporal encephalomalacia. 3. Small left mastoid effusion. Electronically Signed   By: Margaretha Sheffield MD   On: 06/27/2020 21:07        Scheduled Meds: . Chlorhexidine Gluconate Cloth  6 each Topical Daily  . hydrALAZINE  50 mg Oral TID  . isosorbide dinitrate  30 mg Oral TID  . latanoprost  1 drop Both Eyes QHS  . levothyroxine  25 mcg Oral Daily  . loratadine  10 mg Oral Daily  . metoprolol succinate  50 mg Oral Daily  . NIFEdipine  60 mg Oral QHS  . pantoprazole  40 mg Oral BID  . simvastatin  40 mg Oral QHS  . sucralfate  1 g Oral TID WC & HS  . tamsulosin  0.4 mg Oral QPC supper   Continuous Infusions:   LOS: 8 days     Cordelia Poche, MD [facility] 06/29/2020, 12:44 PM  If 7PM-7AM, please contact night-coverage [email or web address]

## 2020-06-29 NOTE — Progress Notes (Signed)
[facility] Progress Note  Name: Brian Wolf MRN: [id number] DOB: 30-Aug-1931  Chief Complaint:  Presented with black stools  Subjective:  he had 300 ml and one unmeasured void over 5/28.  Note palliative care was consulted today. They discussed goals of care with his wife and they are continuing to discuss goals of care with the family. His wife shared with palliative that she knows things aren't going well.  He is a little more frustrated with review of systems and orienting questions today "just let me eat".  Per his wife has been eating better today.   Review of systems:   Denies n/v No chest pain or shortness of breath  ----------- Background on consult:  Brian Wolf is a 87 y.o. male with history including HTN, dementia, chronic combined systolic and diastolic CHF, stage IV CKD who presented with black stools.  Also has had weakness.  Spoke with his wife who provided history.  He had to cancel appt with nephrologist at [facility] earlier this year due to hospitalization.  She states that he has been more confused since Tuesday - dementia normally more mild. Note had a large right pleural effusion s/p thoracentesis this admission.  Earlier got IV diuretics then was transitioned to orals.  500 ml uop and two unmeasured voids.  They are going to in/out cath for a urine sample shortly.  Had foley at one point they state and was removed.   Intake/Output Summary (Last 24 hours) at 06/29/2020 1243 Last data filed at 06/29/2020 0802 Gross per 24 hour  Intake 565 ml  Output 450 ml  Net 115 ml    Vitals:  Vitals:   06/28/20 1448 06/28/20 2040 06/29/20 0338 06/29/20 0500  BP: (!) 119/52 133/60 (!) 122/44   Pulse: 62 62 (!) 56   Resp: 16 (!) 21 18   Temp: 98 F (36.7 C) 98.7 F (37.1 C) 97.8 F (36.6 C)   TempSrc: Oral Oral Oral   SpO2: 97% 98% 98%   Weight:    65.6 kg  Height:         Physical Exam:  General elderly male in bed in no acute  distress HEENT normocephalic atraumatic Neck supple trachea midline Lungs clear to auscultation bilaterally normal work of breathing at rest  Heart S1S2 no rub Abdomen soft nontender nondistended Extremities right pedal edema otherwise no lower extremity edema appreciated Psych no anxiety; somewhat frustrated with questions  Neuro awake on arrival; Oriented to person and location of cone.  Doesn't know year.  gu no foley   Medications reviewed   Labs:  BMP Latest Ref Rng & Units 06/29/2020 06/28/2020 06/27/2020  Glucose 70 - 99 mg/dL 119(H) 138(H) 158(H)  BUN 8 - 23 mg/dL 90(H) 83(H) 79(H)  Creatinine 0.61 - 1.24 mg/dL 3.16(H) 3.26(H) 3.28(H)  Sodium 135 - 145 mmol/L 137 138 139  Potassium 3.5 - 5.1 mmol/L 3.7 3.6 4.1  Chloride 98 - 111 mmol/L 108 110 109  CO2 22 - 32 mmol/L 21(L) 21(L) 20(L)  Calcium 8.9 - 10.3 mg/dL 8.1(L) 8.4(L) 8.1(L)     Assessment/Plan:   # CKD stage IV - recent pre-renal insults with GI bleed.  Urinary retention contributing as well.  Variable baseline recently in the mid to high 2's -low 3's.  UA (in/out cath per report) with 100 mg/dL protein and 11-20 RBC. Had straight cath with 450 ml on 5/27.  BUN rising but also note hx of GI bleed  - resume lasix  as below - UA above  - has a foley  # Chronic combined systolic and diastolic CHF  - no overt overload - Start back lasix at 60 mg daily for now.  Note hx of pleural effusion so avoid prolonged time off.  IV diuretics initially and had been transitioned to oral diuretics.    # HTN  - acceptable control   # Dementia  - noted patient has health care agents and wife his supplemented his history  # GI bleed - GI was consulted and has recommended protonix   - note is on prednisone - per primary team discretion   # Anemia acute blood loss  - no acute indication for PRBC's  - feraheme IV once on 5/28 - aranesp 40 mcg once on 5/29  Nephrology will sign off.  Goals of care discussions ongoing and  he has been more frustrated when bothered and is comfortable on my exam.  Recommendations above are in place.  Please do not hesitate to contact us with any questions   Claudia Desanctis, MD 06/29/2020 1:03 PM

## 2020-06-29 NOTE — Consult Note (Addendum)
Palliative Medicine Inpatient Consult Note  Reason for consult:  Goals of Care "Goals of care. Patient declining."  HPI:  Per intake H&P --> Brian Wolf is a 87 y.o. male with history of hypertension, hyperlipidemia, dementia, thromboembolic CVA, chronic combined systolic and diastolic heart failure, OSA on CPAP, CKD stage IV, upper GI bleeding secondary to duodenal ulcer.  Patient presented secondary to recurrent black stools with concern for upper GI bleeding. Gastroenterology was consulted with recommendations for no repeat endoscopies. Recommendation to restart Protonix therapy.  While admitted, patient was noted to have pleural effusion requiring Lasix administration which is led to worsening AKI on CKD stage IV.  Brian Wolf was recently seen by the palliative care team on April 15 at which time it was identified he would go home with hospice.  Palliative care has been reconsulted to further address goals of care in the setting of recurrent hospitalizations & multiple chronic comorbid conditions.  Clinical Assessment/Goals of Care:  *Please note that this is a verbal dictation therefore any spelling or grammatical errors are due to the "[place] One" system interpretation.  I have reviewed medical records including EPIC notes, labs and imaging, received report from bedside RN, assessed the patient is lying in bed in no acute distress.    I met with Brian Wolf, Brian Wolf to further discuss diagnosis prognosis, GOC, EOL wishes, disposition and options.   I introduced Palliative Medicine as specialized medical care for people living with serious illness. It focuses on providing relief from the symptoms and stress of a serious illness. The goal is to improve quality of life for both the patient and the family.  Brian Wolf shares with me that he is from [place] originally.  He immigrated to [place] in 1981 moved to [place], [place].  He has been married to his wife  Brian Wolf for the past 35 years.  He has 2 children a son and a daughter from his previous marriage.  He is a retired Engineer, water.  He is a man of faith and practices within [employer].  Prior to March of this year he was able to walk with a walker.  He was able to assist with all basic activities of daily living prior to coming in for an ulcer and pneumonia.  Per conversations with Brian Wolf since March she has had recurrent hospitalizations and a steady decline in functional status.  A detailed discussion was had today regarding advanced directives -read expresses that at 1 point in time she was the patient's healthcare power of attorney though he suffered a stroke in 2015 and his son, Brian Wolf was identified as the healthcare power of attorney thereafter. Though it appears in September of 2021 Brian Wolf was named HCPOA this documentation is in [place] chronic comorbidities inclusive of his dementia, prior history of stroke, heart failure, obstructive sleep apnea, and worsening kidney function.  Viewed his functional decline.  Per Brian Wolf her goals are for Brian Wolf to gain strength and ideally to be more mobile in the home.  She would like to continue to care for him as best she can though she herself suffers from some health ailments (recent left hip replacement).  Brian Wolf shares with me that she has a lot of angst as a relates to Brian Wolf's two children and their lack of attention to his needs.  She shares that she has not heard from his son since May nor has she heard from Brian Wolf's daughter since March 1st of this  year.   Concepts specific to code status, artifical feeding and hydration, continued IV antibiotics and rehospitalization was had.    Reviewed and completed a MOST form with one another as below:  Cardiopulmonary Resuscitation: Do Not Attempt Resuscitation (DNR/No CPR)  Medical Interventions: Limited Additional Interventions: Use medical treatment, IV fluids and cardiac monitoring  as indicated, DO NOT USE intubation or mechanical ventilation. May consider use of less invasive airway support such as BiPAP or CPAP. Also provide comfort measures. Transfer to the hospital if indicated. Avoid intensive care.   Antibiotics: Determine use of limitation of antibiotics when infection occurs  IV Fluids: IV fluids for a defined trial period  Feeding Tube: No feeding tube   The difference between a aggressive medical intervention path  and a palliative comfort care path for this patient at this time was had.  We reviewed that Brian Wolf had been on hospice under Brian Wolf until 2-1/2 weeks ago when services were suspended.  Brian Wolf shares that she had called them on multiple occasions requesting certain things that were not provided in a timely enough manner.  She shares that they were "not helpful at all" and that she does not even want to entertain the idea of hospice moving forward.  I was able to share with Brian Wolf that not all hospice agencies are the same and some are better than others.  Brian Wolf does acknowledge that Brian Wolf is "dying" and that his time on earth will be very short.  She expresses that she wants to continue caring for him as best she can.  She plans to hire a personal care aid to further aid in caring for him.  Discussed the importance of continued conversation with family and their  medical providers regarding overall plan of care and treatment options, ensuring decisions are within the context of the patients values and GOCs.  Provided "Hard Choices for Loving People" booklet.  __________________________________________ Addendum:  Was able to retrieve official HCPOA documents under media tab 04/15/20 which do state Brian Wolf (son) as the most recent 66 as of July 19,2021 whichpreceeds the prior advance directive created on October 26, 2019 naming Brian Wolf (Wolf). Have informed the primary team of this.    Decision Maker: HCPOA - Brian Wolf (As of July  2021)  SUMMARY OF RECOMMENDATIONS   DNAR/DNI  MOST Completed, paper copy placed onto the chart electric copy can be found in [facility]  DNR Form Completed, paper copy placed onto the chart electric copy can be found in [facility]  Please note another updated HCPOA Advance directive forms are in the "Media Tab" under 04/15/2020 (HCPOA signed August 20, 2019). MOST recent one in [place] appears appropriate naming his wife, Brian Wolf as HCPOA ( 10/26/2019).  Brian Wolf was prior enrolled in [facility] though due to the lack of timely responsiveness his wife, Brian Wolf discontinued their services  Spiritual Support - Catholic  Goals: Optimize Brian Wolf to get back home. Wife is interested in in home PT/OT and has hired a personal aid to help with bADL's  Ongoing support from the PMT.   Code Status/Advance Care Planning: DNAR/DNI   Palliative Prophylaxis:   Oral Care, Mobility  Additional Recommendations (Limitations, Scope, Preferences):  Treat what is treatable  Psycho-social/Spiritual:   Desire for further Chaplaincy support: Yes - Catholic  Additional Recommendations:  Education on chronic comorbid conditions   Prognosis:  Patient is hospice appropriate based upon multisystem failure and terminal illness on the frailty scale  Discharge Planning:   Vitals:  06/28/20 2040 06/29/20 0338  BP: 133/60 (!) 122/44  Pulse: 62 (!) 56  Resp: (!) 21 18  Temp: 98.7 F (37.1 C) 97.8 F (36.6 C)  SpO2: 98% 98%    Intake/Output Summary (Last 24 hours) at 06/29/2020 7614 Last data filed at 06/29/2020 0341 Gross per 24 hour  Intake 535 ml  Output 300 ml  Net 235 ml   Last Weight  Most recent update: 06/28/2020  5:39 AM   Weight  64.4 kg (141 lb 15.6 oz)           Gen: Frail elderly Asian male in no acute distress HEENT: moist mucous membranes CV: Regular rate and rhythm PULM: On 1 L nasal cannula ABD: soft/nontende EXT: No edema Neuro: Alert and oriented x3  PPS: 30%   This  conversation/these recommendations were discussed with patient primary care team, Dr. Lonny Prude  Time In: 0830 Time Out: 1000 Total Time: 90 Greater than 50%  of this time was spent counseling and coordinating care related to the above assessment and plan.  [place] Team Team Cell Phone: [phone] Please utilize secure chat with additional questions, if there is no response within 30 minutes please call the above phone number  Palliative Medicine Team providers are available by phone from 7am to 7pm daily and can be reached through the team cell phone.  Should this patient require assistance outside of these hours, please call the patient's attending physician.

## 2020-06-30 DIAGNOSIS — I10 Essential (primary) hypertension: Secondary | ICD-10-CM | POA: Diagnosis not present

## 2020-06-30 DIAGNOSIS — I5042 Chronic combined systolic (congestive) and diastolic (congestive) heart failure: Secondary | ICD-10-CM | POA: Diagnosis not present

## 2020-06-30 DIAGNOSIS — K922 Gastrointestinal hemorrhage, unspecified: Secondary | ICD-10-CM | POA: Diagnosis not present

## 2020-06-30 DIAGNOSIS — N184 Chronic kidney disease, stage 4 (severe): Secondary | ICD-10-CM | POA: Diagnosis not present

## 2020-06-30 LAB — BASIC METABOLIC PANEL
Anion gap: 9 (ref 5–15)
BUN: 86 mg/dL — ABNORMAL HIGH (ref 8–23)
CO2: 19 mmol/L — ABNORMAL LOW (ref 22–32)
Calcium: 8.1 mg/dL — ABNORMAL LOW (ref 8.9–10.3)
Chloride: 107 mmol/L (ref 98–111)
Creatinine, Ser: 3.19 mg/dL — ABNORMAL HIGH (ref 0.61–1.24)
GFR, Estimated: 18 mL/min — ABNORMAL LOW (ref >60)
Glucose, Bld: 107 mg/dL — ABNORMAL HIGH (ref 70–99)
Potassium: 3.9 mmol/L (ref 3.5–5.1)
Sodium: 135 mmol/L (ref 135–145)

## 2020-06-30 LAB — CULTURE, BODY FLUID W GRAM STAIN -BOTTLE: Culture: NO GROWTH

## 2020-06-30 LAB — URINE CULTURE: Culture: >=100000 — AB

## 2020-06-30 LAB — CBC
HCT: 27.7 % — ABNORMAL LOW (ref 39.0–52.0)
Hemoglobin: 9 g/dL — ABNORMAL LOW (ref 13.0–17.0)
MCH: 30 pg (ref 26.0–34.0)
MCHC: 32.5 g/dL (ref 30.0–36.0)
MCV: 92.3 fL (ref 80.0–100.0)
Platelets: 460 10*3/uL — ABNORMAL HIGH (ref 150–400)
RBC: 3 MIL/uL — ABNORMAL LOW (ref 4.22–5.81)
RDW: 15 % (ref 11.5–15.5)
WBC: 18.6 10*3/uL — ABNORMAL HIGH (ref 4.0–10.5)
nRBC: 0.3 % — ABNORMAL HIGH (ref 0.0–0.2)

## 2020-06-30 NOTE — Progress Notes (Signed)
PROGRESS NOTE    Brian Wolf  LKG:[id number] DOB: 22-Apr-1931 DOA: 06/20/2020 PCP: [name], DO   Brief Narrative: Brian Wolf is a 87 y.o. male with history of hypertension, hyperlipidemia, dementia, thromboembolic CVA, chronic combined systolic and diastolic heart failure, OSA on CPAP, CKD stage IV, upper GI bleeding secondary to duodenal ulcer.  Patient presented secondary to recurrent black stools with concern for upper GI bleeding.  Gastroenterology was consulted with recommendations for no repeat endoscopies.  Recommendation to restart Protonix therapy.  While admitted, patient was noted to have pleural effusion requiring Lasix administration which is led to worsening AKI on CKD stage IV.   Assessment & Plan:   Principal Problem:   Acute upper GI bleeding Active Problems:   Benign essential HTN   Obstructive sleep apnea syndrome   Hyperkalemia   CKD (chronic kidney disease), stage IV (HCC)   DNR (do not resuscitate)   Dementia ([place])   Chronic combined systolic and diastolic CHF (congestive heart failure) (HCC)   GIB (gastrointestinal bleeding)   Upper GI bleed [place] Patient with a prior history of duodenal ulcer.  2 recent endoscopies in the past year.  GI consulted for recommendations.  No repeat endoscopy performed this admission.  Patient was not taking Protonix as an outpatient as recommended. -GI recommendations: Protonix 40 mg twice daily x6 weeks followed by 40 mg daily indefinitely  Chronic anemia Hemoglobin currently stable.  Hemoglobin of 11.9 on prior ED visit with a hemoglobin of 9.9 on admission which stabilized to around 9.  Possible chronic blood loss anemia.  In setting of above.  Hemoglobin stable.  Acute respiratory failure with hypoxia In setting of pleural effusion.  Patient has been managed with IV Lasix which is now transition to oral Lasix.  Persistent right pleural effusion. Weaned to room air.  Large right pleural  effusion Possibly related to recent pneumonia.  Patient required thoracentesis in March 2022.  Possibly complicating respiratory status currently.  No improvement with diuresis. Thoracentesis ordered on 5/25 yielding about 1 L of yellow fluid. Initial labs consistent with transudate fluid. Cultures with no growth to date. Pathology significant for chronic inflammation.  AKI on CKD stage IV Baseline creatinine of 2.5-2.7.  Creatinine of 2.9 on admission which is steadily increased to a creatinine high of 3.41 with IV diuresis now transition to oral. Creatinine stabilized now at 3.28. Urine output of 750 mL -Daily BMP -Watch urine output carefully -Nephrology recommendations: hold lasix, foley ordered  Leukocytosis Stable. Slightly worsened after steroids. Stable.  VRE positive urinalysis Asymptomatic bacteruria Noted.  Chronic combined systolic and diastolic heart failure Last echo from 04/06/2020 significant for an EF of 35 to 40% with grade 1 diastolic dysfunction.  Patient is on Lasix 40 mg twice daily, metoprolol succinate 50 mg daily as an outpatient. -Continue Toprol-XL  Hyperkalemia Potassium of 5.7 likely secondary to AKI.  Patient received 1 dose of Lokelma 10 g with improvement of potassium.  Resolved.  Hypertension -Continue hydralazine, isosorbide dinitrate, metoprolol succinate, nifedipine  Hypothyroidism -Continue Synthroid  Dementia Patient with steady decline over the last few months. Likely contributing to current presentation.  Somnolence Concern patient is declining medically/physically. Wife is also concerned that he is nearing end of life Workup so far is negative with non-acute head CT, normal B12, folate, TSH. Prior ABG without hypercarbia. His mental status has significantly improved and is back to baseline.  BPH -Continue Flomax  Hyperlipidemia -Continue simvastatin  Glaucoma -Continue Xalatan  Likely gout Started  on prednisone with improvement and  completion of treatment.   DVT prophylaxis: SCDs Code Status:   Code Status: DNR Family Communication: None at bedside Disposition Plan: Discharge home likely in 1-2 days pending nephrology recs   Consultants:   Gastroenterology [facility])  Nephrology  Palliative care medicine  Procedures:   None  Antimicrobials:  None   Subjective: Patient reports no issues this morning.  Objective: Vitals:   06/29/20 0500 06/29/20 1510 06/29/20 1954 06/30/20 0437  BP:  (!) 139/50 (!) 130/50 139/60  Pulse:  (!) 58 61 64  Resp:  20 19 18   Temp:  97.8 F (36.6 C) 97.7 F (36.5 C) 98.8 F (37.1 C)  TempSrc:  Axillary Oral Oral  SpO2:  97% 98% 95%  Weight: 65.6 kg     Height:        Intake/Output Summary (Last 24 hours) at 06/30/2020 1307 Last data filed at 06/30/2020 1030 Gross per 24 hour  Intake 620 ml  Output 600 ml  Net 20 ml   Filed Weights   06/20/20 1418 06/28/20 0432 06/29/20 0500  Weight: 68 kg 64.4 kg 65.6 kg    Examination:  General exam: Appears calm and comfortable Respiratory system: Clear to auscultation. Respiratory effort normal. Cardiovascular system: S1 & S2 heard, RRR. No murmurs, rubs, gallops or clicks. Gastrointestinal system: Abdomen is nondistended, soft and nontender. No organomegaly or masses felt. Normal bowel sounds heard. Central nervous system: Alert and oriented to person and place. Musculoskeletal: No edema. No calf tenderness Skin: No cyanosis. No rashes Psychiatry: Judgement and insight appear impaired. Memory impaired. Blunt affect   Data Reviewed: I have personally reviewed following labs and imaging studies  CBC Lab Results  Component Value Date   WBC 18.6 (H) 06/30/2020   RBC 3.00 (L) 06/30/2020   HGB 9.0 (L) 06/30/2020   HCT 27.7 (L) 06/30/2020   MCV 92.3 06/30/2020   MCH 30.0 06/30/2020   PLT 460 (H) 06/30/2020   MCHC 32.5 06/30/2020   RDW 15.0 06/30/2020   LYMPHSABS 4.3 (H) 06/20/2020   MONOABS 1.4 (H) 06/20/2020    EOSABS 0.6 (H) 06/20/2020   BASOSABS 0.1 95/63/8756     Last metabolic panel Lab Results  Component Value Date   NA 135 06/30/2020   K 3.9 06/30/2020   CL 107 06/30/2020   CO2 19 (L) 06/30/2020   BUN 86 (H) 06/30/2020   CREATININE 3.19 (H) 06/30/2020   GLUCOSE 107 (H) 06/30/2020   GFRNONAA 18 (L) 06/30/2020   GFRAA 39 (L) 10/22/2019   CALCIUM 8.1 (L) 06/30/2020   PHOS 3.2 04/18/2020   PROT 5.9 (L) 06/25/2020   ALBUMIN 2.3 (L) 06/22/2020   BILITOT 0.4 06/22/2020   ALKPHOS 79 06/22/2020   AST 23 06/22/2020   ALT 15 06/22/2020   ANIONGAP 9 06/30/2020    CBG (last 3)  Recent Labs    06/28/20 0903  GLUCAP 119*     GFR: Estimated Creatinine Clearance: 13.9 mL/min (A) (by C-G formula based on SCr of 3.19 mg/dL (H)).  Coagulation Profile: No results for input(s): INR, PROTIME in the last 168 hours.  Recent Results (from the past 240 hour(s))  Resp Panel by RT-PCR (Flu A&B, Covid) Nasopharyngeal Swab     Status: None   Collection Time: 06/20/20  1:45 PM   Specimen: Nasopharyngeal Swab; Nasopharyngeal(NP) swabs in vial transport medium  Result Value Ref Range Status   SARS Coronavirus 2 by RT PCR NEGATIVE NEGATIVE Final    Comment: (NOTE)  SARS-CoV-2 target nucleic acids are NOT DETECTED.  The SARS-CoV-2 RNA is generally detectable in upper respiratory specimens during the acute phase of infection. The lowest concentration of SARS-CoV-2 viral copies this assay can detect is 138 copies/mL. A negative result does not preclude SARS-Cov-2 infection and should not be used as the sole basis for treatment or other patient management decisions. A negative result may occur with  improper specimen collection/handling, submission of specimen other than nasopharyngeal swab, presence of viral mutation(s) within the areas targeted by this assay, and inadequate number of viral copies(<138 copies/mL). A negative result must be combined with clinical observations, patient history,  and epidemiological information. The expected result is Negative.  Fact Sheet for Patients:  EntrepreneurPulse.com.au  Fact Sheet for Healthcare Providers:  IncredibleEmployment.be  This test is no t yet approved or cleared by the [place] FDA and  has been authorized for detection and/or diagnosis of SARS-CoV-2 by FDA under an Emergency Use Authorization (EUA). This EUA will remain  in effect (meaning this test can be used) for the duration of the COVID-19 declaration under Section 564(b)(1) of the Act, 21 U.S.C.section 360bbb-3(b)(1), unless the authorization is terminated  or revoked sooner.       Influenza A by PCR NEGATIVE NEGATIVE Final   Influenza B by PCR NEGATIVE NEGATIVE Final    Comment: (NOTE) The Xpert Xpress SARS-CoV-2/FLU/RSV plus assay is intended as an aid in the diagnosis of influenza from Nasopharyngeal swab specimens and should not be used as a sole basis for treatment. Nasal washings and aspirates are unacceptable for Xpert Xpress SARS-CoV-2/FLU/RSV testing.  Fact Sheet for Patients: EntrepreneurPulse.com.au  Fact Sheet for Healthcare Providers: IncredibleEmployment.be  This test is not yet approved or cleared by the [place] FDA and has been authorized for detection and/or diagnosis of SARS-CoV-2 by FDA under an Emergency Use Authorization (EUA). This EUA will remain in effect (meaning this test can be used) for the duration of the COVID-19 declaration under Section 564(b)(1) of the Act, 21 U.S.C. section 360bbb-3(b)(1), unless the authorization is terminated or revoked.  Performed at [facility], [place] [address] [place], [place] [address]   Gram stain     Status: None   Collection Time: 06/25/20 12:41 PM   Specimen: Lung, Right; Pleural Fluid  Result Value Ref Range Status   Specimen Description PLEURAL FLUID  Final   Special Requests LUNG RIGHT  Final    Gram Stain   Final    WBC PRESENT,BOTH PMN AND MONONUCLEAR NO ORGANISMS SEEN CYTOSPIN SMEAR Performed at [facility], [address], [place], [place] [address]    Report Status 06/25/2020 FINAL  Final  Culture, body fluid w Gram Stain-bottle     Status: None   Collection Time: 06/25/20 12:41 PM   Specimen: Pleura  Result Value Ref Range Status   Specimen Description PLEURAL FLUID  Final   Special Requests LUNG RIGHT  Final   Culture   Final    NO GROWTH 5 DAYS Performed at [facility] [address], [place], [place] [address]    Report Status 06/30/2020 FINAL  Final  Culture, Urine     Status: Abnormal   Collection Time: 06/27/20  1:59 PM   Specimen: Urine, Clean Catch  Result Value Ref Range Status   Specimen Description URINE, CLEAN CATCH  Final   Special Requests   Final    NONE Performed at [facility], [place] [address] [place], [place] [address]  Culture (A)  Final    >=100,000 COLONIES/mL VANCOMYCIN RESISTANT ENTEROCOCCUS ISOLATED   Report Status 06/30/2020 FINAL  Final   Organism ID, Bacteria VANCOMYCIN RESISTANT ENTEROCOCCUS ISOLATED (A)  Final      Susceptibility   Vancomycin resistant enterococcus isolated - MIC*    AMPICILLIN >=32 RESISTANT Resistant     NITROFURANTOIN 128 RESISTANT Resistant     VANCOMYCIN >=32 RESISTANT Resistant     LINEZOLID 2 SENSITIVE Sensitive     * >=100,000 COLONIES/mL VANCOMYCIN RESISTANT ENTEROCOCCUS ISOLATED        Radiology Studies: No results found.      Scheduled Meds: . Chlorhexidine Gluconate Cloth  6 each Topical Daily  . darbepoetin (ARANESP) injection - NON-DIALYSIS  40 mcg Subcutaneous Once  . furosemide  60 mg Oral Daily  . hydrALAZINE  50 mg Oral TID  . isosorbide dinitrate  30 mg Oral TID  . latanoprost  1 drop Both Eyes QHS  . levothyroxine  25 mcg Oral Daily  . loratadine  10 mg Oral Daily  . metoprolol succinate  50 mg Oral Daily  . NIFEdipine  60 mg Oral QHS  . pantoprazole   40 mg Oral BID  . simvastatin  40 mg Oral QHS  . sucralfate  1 g Oral TID WC & HS  . tamsulosin  0.4 mg Oral QPC supper   Continuous Infusions:   LOS: 9 days     Cordelia Poche, MD [facility] 06/30/2020, 1:07 PM  If 7PM-7AM, please contact night-coverage [email or web address]

## 2020-06-30 NOTE — Progress Notes (Signed)
Occupational Therapy Treatment Patient Details Name: Brian Wolf MRN: [id number] DOB: [birth year] Today's Date: 06/30/2020    History of present illness 87 y.o. male with medical history significant of HTN; HLD; dementia; CVA (2015); chronic combined CHF; OSA on CPAP; stage 4 CKD; and admission for UGI bleed in March (EGD with non-bleeding duodenal ulcer with no stigmata of bleeding) presenting with GI bleeding.   OT comments  Brian Wolf is progressing well, pt's wife reports that he is mentally and physically doing much better today. Pt initially refusing, however with gentle encouragement was agreeable to groom while sitting EOB. Pt oriented to person and place. Wife reports that pt fed himself this morning with no evidence of food pocketing. Pt required set up to shave face while sitting EOB. He accurately directed his care to have Semmes placed beside him for BM; therapist dependently completed pericare after BM. Pt was min A +1 for balance and weight shifting for stand pivot transfers 2x. Pt continues to benefit from continued OT acutely to progress function in ADLs and mobility. D/c plan remains appropriate.    Follow Up Recommendations  SNF;Supervision/Assistance - 24 hour    Equipment Recommendations  Wheelchair (measurements OT);Wheelchair cushion (measurements OT)    Recommendations for Other Services Speech consult    Precautions / Restrictions Precautions Precautions: Fall Restrictions Weight Bearing Restrictions: No       Mobility Bed Mobility Overal bed mobility: Needs Assistance Bed Mobility: Supine to Sit     Supine to sit: Min assist          Transfers Overall transfer level: Needs assistance Equipment used: 2 person hand held assist Transfers: Sit to/from [facility] Sit to Stand: Min assist Stand pivot transfers: Min assist            Balance Overall balance assessment: Needs assistance Sitting-balance support: Feet supported;No  upper extremity supported Sitting balance-[name]: Fair     Standing balance support: Bilateral upper extremity supported Standing balance-[name]: Poor             ADL either performed or assessed with clinical judgement   ADL Overall ADL's : Needs assistance/impaired     Grooming: Wash/dry hands;Wash/dry face;Set up;Sitting;Supervision/safety (shaved face) Grooming Details (indicate cue type and reason): Pt reuested to shave face, required set up and help with shaveing cream bottle; shaved with supervision                 Toilet Transfer: Minimal assistance;BSC;Stand-pivot Toilet Transfer Details (indicate cue type and reason): min A for balance and weight shifting in standing Toileting- Clothing Manipulation and Hygiene: Total assistance;Cueing for safety;Sit to/from stand Toileting - Clothing Manipulation Details (indicate cue type and reason): Therapist dependent in pericare after BM     Functional mobility during ADLs: Minimal assistance;Cueing for safety;Cueing for sequencing General ADL Comments: pt with min A for functional mobility for balance and weight shifting; pt able to direct his care, i.e. verbalized need to shave and verbalized need to use BSC and its placement next to the bed      Cognition Arousal/Alertness: Awake/alert Behavior During Therapy: Flat affect Overall Cognitive Status: History of cognitive impairments - at baseline       General Comments: Awake and alert upon arrival, followed simple one step commands; oriented to person and place; initally refused therapy however with gentle encouragement agreed to participate in EOB grooming              General Comments Pt no no apparent skin  integrity issues noted; pt's stool was dark in color, RN notified that wife was concerned of the color    Pertinent Vitals/ Pain       Pain Assessment: Faces Faces Pain Scale: Hurts little more Pain Location: groin area, near catheter Pain  Descriptors / Indicators: Grimacing;Moaning Pain Intervention(s): Limited activity within patient's tolerance;Monitored during session;Other (comment) (RN notified)         Frequency  Min 2X/week        Progress Toward Goals  OT Goals(current goals can now be found in the care plan section)  Progress towards OT goals: Progressing toward goals  Acute Rehab OT Goals Patient Stated Goal: None stated OT Goal Formulation: With patient/family Time For Goal Achievement: 07/05/20 Potential to Achieve Goals: Good ADL Goals Pt Will Perform Grooming: with supervision;standing Pt Will Perform Upper Body Bathing: with supervision;sitting;standing Pt Will Perform Lower Body Bathing: with min guard assist;sitting/lateral leans;sit to/from stand Pt Will Perform Upper Body Dressing: with modified independence;sitting Pt Will Perform Lower Body Dressing: with min guard assist;sitting/lateral leans;sit to/from stand Pt Will Transfer to Toilet: with min guard assist;ambulating;regular height toilet;grab bars Pt Will Perform Toileting - Clothing Manipulation and hygiene: with supervision;sitting/lateral leans;sit to/from stand Pt Will Perform Tub/Shower Transfer: Tub transfer;Shower transfer;with min guard assist;ambulating;tub bench;grab bars;rolling walker Pt/caregiver will Perform Home Exercise Program: Increased strength;Both right and left upper extremity;With Supervision Additional ADL Goal #1: Pt/wife will verbalize/demonstrate 3 fall prevention strategies to incorporate into ADLs/ADL mobility with supervision-min assist overall. Additional ADL Goal #2: Pt will be Ox4 and follow 2 step commands with 100% accuracy in order to safely participate in ADLs/ADL mobility with supervision-min assist overall.  Plan Discharge plan remains appropriate       AM-PAC OT "6 Clicks" Daily Activity     Outcome Measure   Help from another person eating meals?: A Little Help from another person taking care  of personal grooming?: A Little Help from another person toileting, which includes using toliet, bedpan, or urinal?: A Little Help from another person bathing (including washing, rinsing, drying)?: A Lot Help from another person to put on and taking off regular upper body clothing?: A Little Help from another person to put on and taking off regular lower body clothing?: A Lot 6 Click Score: 16    End of Session Equipment Utilized During Treatment: Gait belt;Other (comment) (BSC)  OT Visit Diagnosis: Unsteadiness on feet (R26.81);Muscle weakness (generalized) (M62.81);History of falling (Z91.81)   Activity Tolerance Patient tolerated treatment well   Patient Left in chair;with call bell/phone within reach;with chair alarm set;with family/visitor present   Nurse Communication Mobility status;Precautions;Weight bearing status;Other (comment) (BM color)        Time: 2751-7001 OT Time Calculation (min): 40 min  Charges: OT General Charges $OT Visit: 1 Visit OT Treatments $Self Care/Home Management : 38-52 mins     Cadance Raus A Gehrig Patras 06/30/2020, 2:41 PM

## 2020-07-01 ENCOUNTER — Other Ambulatory Visit (HOSPITAL_COMMUNITY): Payer: Self-pay

## 2020-07-01 DIAGNOSIS — Z7189 Other specified counseling: Secondary | ICD-10-CM | POA: Diagnosis not present

## 2020-07-01 DIAGNOSIS — Z515 Encounter for palliative care: Secondary | ICD-10-CM | POA: Diagnosis not present

## 2020-07-01 DIAGNOSIS — Z66 Do not resuscitate: Secondary | ICD-10-CM | POA: Diagnosis not present

## 2020-07-01 LAB — CBC
HCT: 25.3 % — ABNORMAL LOW (ref 39.0–52.0)
Hemoglobin: 8.2 g/dL — ABNORMAL LOW (ref 13.0–17.0)
MCH: 30.4 pg (ref 26.0–34.0)
MCHC: 32.4 g/dL (ref 30.0–36.0)
MCV: 93.7 fL (ref 80.0–100.0)
Platelets: 373 10*3/uL (ref 150–400)
RBC: 2.7 MIL/uL — ABNORMAL LOW (ref 4.22–5.81)
RDW: 15.6 % — ABNORMAL HIGH (ref 11.5–15.5)
WBC: 15.2 10*3/uL — ABNORMAL HIGH (ref 4.0–10.5)
nRBC: 0.3 % — ABNORMAL HIGH (ref 0.0–0.2)

## 2020-07-01 LAB — BASIC METABOLIC PANEL
Anion gap: 8 (ref 5–15)
BUN: 82 mg/dL — ABNORMAL HIGH (ref 8–23)
CO2: 21 mmol/L — ABNORMAL LOW (ref 22–32)
Calcium: 7.8 mg/dL — ABNORMAL LOW (ref 8.9–10.3)
Chloride: 104 mmol/L (ref 98–111)
Creatinine, Ser: 2.94 mg/dL — ABNORMAL HIGH (ref 0.61–1.24)
GFR, Estimated: 20 mL/min — ABNORMAL LOW (ref >60)
Glucose, Bld: 106 mg/dL — ABNORMAL HIGH (ref 70–99)
Potassium: 3.5 mmol/L (ref 3.5–5.1)
Sodium: 133 mmol/L — ABNORMAL LOW (ref 135–145)

## 2020-07-01 MED ORDER — PANTOPRAZOLE SODIUM 40 MG PO TBEC
DELAYED_RELEASE_TABLET | ORAL | 0 refills | Status: AC
  Filled 2020-07-01: qty 122, 90d supply, fill #0

## 2020-07-01 MED ORDER — SUCRALFATE 1 G PO TABS
1.0000 g | ORAL_TABLET | Freq: Three times a day (TID) | ORAL | 0 refills | Status: AC
  Filled 2020-07-01: qty 12, 3d supply, fill #0

## 2020-07-01 MED ORDER — FUROSEMIDE 20 MG PO TABS: 60.0000 mg | ORAL_TABLET | Freq: Every day | ORAL | 2 refills | Status: AC

## 2020-07-01 NOTE — Progress Notes (Signed)
Discharge instructions given to patient Brian Wolf and wife [name]. Both verbalized understanding. Iv's taken out.

## 2020-07-01 NOTE — Consult Note (Signed)
   [facility] CM Inpatient Consult   07/01/2020  Brian Wolf 03-Nov-1931 [id number]  Follow up: Disposition  Primary Care Provider: [name], DO of [facility]  This provider is listed for the Transition of Care calls and follow up appointments. Brief review of Palliative consult and TOC team notes for TOC needs.  Plan: [facility] will follow for CM needs.    Sign off  Natividad Brood, RN BSN [facility] Liaison  [phone] business mobile phone Toll free office [phone]  Fax number: [phone] [name]

## 2020-07-01 NOTE — Progress Notes (Signed)
Patient discharged to home with wife via wheelchair with all belongings 

## 2020-07-01 NOTE — Discharge Summary (Signed)
Physician Discharge Summary  Brian Wolf QTM:[id number] DOB: 07-17-1931 DOA: 06/20/2020  PCP: [name], DO  Admit date: 06/20/2020 Discharge date: 07/01/2020  Admitted From: Home Disposition: Home  Recommendations for Outpatient Follow-up:  1. Follow up with PCP in 1 week 2. Follow up with Gastroenterology 3. Please obtain BMP/CBC in one week 4. Please follow up on the following pending results: None  Home Health: PT/OT Equipment/Devices: Wheelchair  Discharge Condition: Stable CODE STATUS: DNR Diet recommendation: Regular diet   Brief/Interim Summary:  Admission HPI written by Karmen Bongo, MD   HPI: Brian Wolf is a 87 y.o. male with medical history significant of HTN; HLD; dementia; CVA (2015); chronic combined CHF; OSA on CPAP; stage 4 CKD; and admission for UGI bleed in March (EGD with non-bleeding duodenal ulcer with no stigmata of bleeding) presenting with GI bleeding.  His wife noticed that his stool was black.  Came in Sunday with diarrhea for a couple of days, sent home.  His stools were dark yesterday.  This AM his stools were black and so she brought him in.  Her has been more weak than usual the last 2 days.  No abdominal pain  +nausea, no vomiting.    He was able to cook until March, none since.  Before March, unassisted ambulation; currently with walk and safety belt.   He was hospitalized for 2 weeks in March and refused rehab since.  He can feed himself and otherwise requires full assistance.   Hospital course:  Upper GI bleed [place] Patient with a prior history of duodenal ulcer.  2 recent endoscopies in the past year.  GI consulted for recommendations.  No repeat endoscopy performed this admission.  Patient was not taking Protonix as an outpatient as recommended. GI recommendated Protonix 40 mg twice daily x6 weeks followed by 40 mg daily indefinitely in addition to Carafate.  Chronic anemia Hemoglobin currently stable.  Hemoglobin of  11.9 on prior ED visit with a hemoglobin of 9.9 on admission which stabilized to around 9.  Possible chronic blood loss anemia.  In setting of above.  Hemoglobin stable.  Acute respiratory failure with hypoxia In setting of pleural effusion.  Patient has been managed with IV Lasix which is now transition to oral Lasix.  Persistent right pleural effusion. Weaned to room air.  Large right pleural effusion Possibly related to recent pneumonia.  Patient required thoracentesis in March 2022.  Possibly complicating respiratory status currently.  No improvement with diuresis. Thoracentesis ordered on 5/25 yielding about 1 L of yellow fluid. Initial labs consistent with transudate fluid. Cultures with no growth to date. Pathology significant for chronic inflammation.  AKI on CKD stage IV Baseline creatinine of 2.5-2.7.  Creatinine of 2.9 on admission which is steadily increased to a creatinine high of 3.41 with IV diuresis now transition to oral. Creatinine stabilized now at 2.94 on day of discharge. Follow-up with nephrology.  Leukocytosis Stable. Slightly worsened after steroids. Stable.  VRE positive urinalysis Asymptomatic bacteruria Noted.  Chronic combined systolic and diastolic heart failure Last echo from 04/06/2020 significant for an EF of 35 to 40% with grade 1 diastolic dysfunction.  Patient is on Lasix 40 mg twice daily, metoprolol succinate 50 mg daily as an outpatient. Continue Toprol-XL  Hyperkalemia Potassium of 5.7 likely secondary to AKI.  Patient received 1 dose of Lokelma 10 g with improvement of potassium.  Resolved.  Hypertension Continue hydralazine, isosorbide dinitrate, metoprolol succinate, nifedipine  Hypothyroidism Continue Synthroid  Dementia Patient with  steady decline over the last few months. Likely contributing to current presentation.  Somnolence Concern patient is declining medically/physically. Wife is also concerned that he is nearing end of  life Workup so far is negative with non-acute head CT, normal B12, folate, TSH. Prior ABG without hypercarbia. His mental status has significantly improved and is back to baseline.  BPH Continue Flomax  Hyperlipidemia Continue simvastatin  Glaucoma Continue Xalatan  Likely gout Started on prednisone with improvement and completion of treatment.  Discharge Diagnoses:  Principal Problem:   Acute upper GI bleeding Active Problems:   Benign essential HTN   Obstructive sleep apnea syndrome   Hyperkalemia   CKD (chronic kidney disease), stage IV (HCC)   DNR (do not resuscitate)   Dementia ([place])   Chronic combined systolic and diastolic CHF (congestive heart failure) ([place])   GIB (gastrointestinal bleeding)    Discharge Instructions  Discharge Instructions    Increase activity slowly   Complete by: As directed      Allergies as of 07/01/2020      Reactions   Lisinopril Cough   Nsaids Other (See Comments)   GI bleed(s)      Medication List    TAKE these medications   Cholecalciferol 50 MCG (2000 UT) Tabs Take 2,000 Units by mouth daily.   fexofenadine 180 MG tablet Commonly known as: ALLEGRA Take 180 mg by mouth in the morning.   furosemide 20 MG tablet Commonly known as: LASIX Take 3 tablets (60 mg total) by mouth daily. Start taking on: July 02, 2020 What changed:   medication strength  how much to take  when to take this   hydrALAZINE 50 MG tablet Commonly known as: APRESOLINE Take 1 tablet (50 mg total) by mouth 3 (three) times daily.   isosorbide dinitrate 30 MG tablet Commonly known as: ISORDIL Take 1 tablet (30 mg total) by mouth 3 (three) times daily.   levothyroxine 25 MCG tablet Commonly known as: SYNTHROID Take 25 mcg by mouth See admin instructions. Take by mouth every morning before a meal   metoprolol succinate 50 MG 24 hr tablet Commonly known as: TOPROL-XL Take 50 mg by mouth daily.   mupirocin ointment 2 % Commonly known as:  BACTROBAN Apply 1 application topically daily as needed for irritation.   NIFEdipine 60 MG 24 hr tablet Commonly known as: ADALAT CC Take 60 mg by mouth at bedtime.   ondansetron 4 MG disintegrating tablet Commonly known as: Zofran ODT Take 1 tablet (4 mg total) by mouth every 8 (eight) hours as needed for nausea or vomiting.   pantoprazole 40 MG tablet Commonly known as: PROTONIX Take 1 tablet (40 mg total) by mouth 2 (two) times daily for 31 days, THEN 1 tablet (40 mg total) daily. Start taking on: Jul 01, 2020 What changed: See the new instructions.   potassium chloride SA 20 MEQ tablet Commonly known as: KLOR-CON Take 1 tablet (20 mEq total) by mouth 2 (two) times daily.   PROBIOTIC DAILY PO Take 1 capsule by mouth in the morning.   simvastatin 40 MG tablet Commonly known as: ZOCOR Take 40 mg by mouth at bedtime.   sucralfate 1 g tablet Commonly known as: CARAFATE Take 1 tablet (1 g total) by mouth 4 (four) times daily -  with meals and at bedtime for 3 days.   tamsulosin 0.4 MG Caps capsule Commonly known as: FLOMAX Take 0.4 mg by mouth daily after supper.   Travoprost (BAK Free) 0.004 % Soln ophthalmic  solution Commonly known as: TRAVATAN Place 1 drop into both eyes every evening.            Durable Medical Equipment  (From admission, onward)         Start     Ordered   06/23/20 1213  For home use only DME standard manual wheelchair with seat cushion  Once       Comments: Patient suffers from Pulmonary edema -wheezing which impairs their ability to perform daily activities like ambulating   in the home.  A cane will not resolve issue with performing activities of daily living. A wheelchair will allow patient to safely perform daily activities. Patient can safely propel the wheelchair in the home or has a caregiver who can provide assistance. Length of need lifetime. Accessories: elevating leg rests (ELRs), wheel locks, extensions and anti-tippers.  Seat  and back cushions.   06/23/20 1213          Follow-up Information    [facility] Follow up.   Specialty: Home Health Services Contact information: [place] [address] [phone]        [name], DO. Schedule an appointment as soon as possible for a visit in 1 week(s).   Specialty: Family Medicine Why: Hospital follow-up Contact information: [address] [place] [address] [phone]        Ronald Lobo, MD. Go to.   Specialty: Gastroenterology Why: Hospital follow-up Contact information: [address] [place] [address] [phone]              Allergies  Allergen Reactions  . Lisinopril Cough  . Nsaids Other (See Comments)    GI bleed(s)     Consultations:  Gastroenterology  Palliative care medicine  Nephrology   Procedures/Studies: DG Chest 1 View  Result Date: 06/25/2020 CLINICAL DATA:  Post thoracentesis right side. EXAM: CHEST  1 VIEW COMPARISON:  06/25/2020. FINDINGS: Mediastinum and hilar structures are unremarkable. Cardiomegaly. No pulmonary venous congestion. Interim near complete resolution of right pleural effusion following thoracentesis. No pneumothorax. Mild left base atelectasis. Small left pleural effusion cannot be excluded. No acute bony abnormality. IMPRESSION: 1. Interim near complete resolution of right pleural effusion following thoracentesis. No pneumothorax. 2. Mild left base atelectasis. Small left pleural effusion cannot be excluded. Electronically Signed   By: Marcello Moores  Register   On: 06/25/2020 13:08   CT HEAD WO CONTRAST  Result Date: 06/27/2020 CLINICAL DATA:  Mental status change. EXAM: CT HEAD WITHOUT CONTRAST TECHNIQUE: Contiguous axial images were obtained from the base of the skull through the vertex without intravenous contrast. COMPARISON:  CT head Jun 27, 2020 FINDINGS: Brain: No evidence of acute large vascular territory infarction,  hemorrhage, hydrocephalus, extra-axial collection or mass lesion/mass effect. Similar right posterior temporal encephalomalacia. No substantial change in extensive patchy hypodensity within the white matter, likely related to chronic microvascular ischemic disease. Similar moderate generalized cerebral atrophy. Vascular: Calcific atherosclerosis. No hyperdense vessel identified. Skull: No acute fracture. Sinuses/Orbits: Left frontal osteoma and mild ethmoid air cell mucosal thickening. No acute orbital abnormality. Other: Small left mastoid effusion. IMPRESSION: 1. No evidence of acute intracranial abnormality. 2. Extensive chronic microvascular ischemic disease and similar right temporal encephalomalacia. 3. Small left mastoid effusion. Electronically Signed   By: Margaretha Sheffield MD   On: 06/27/2020 21:07   DG Chest Port 1 View  Result Date: 06/25/2020 CLINICAL DATA:  Pleural effusion EXAM: PORTABLE CHEST 1 VIEW COMPARISON:  06/23/2020 FINDINGS: Large  right pleural effusion and small left pleural effusion are stable when compared to prior examination. Associated bibasilar compressive atelectasis. Right apical paratracheal soft tissue density is stable from multiple prior examinations dating as far back as 12/14/2013 and is most likely related to vascular shadow. Mild cardiomegaly is stable. Pulmonary vascularity is normal. Multiple healed right rib fractures are noted. IMPRESSION: Stable large right and small left pleural effusions. Electronically Signed   By: Fidela Salisbury MD   On: 06/25/2020 06:15   DG Chest Port 1 View  Result Date: 06/23/2020 CLINICAL DATA:  Encounter for pulmonary edema EXAM: PORTABLE CHEST 1 VIEW.  Patient rotation. COMPARISON:  Chest x-ray 06/21/2020. chest x-ray 05/01/2020, chest x-ray 05/13/2020 FINDINGS: The heart size and mediastinal contours are unchanged. Aortic calcification. Redemonstration of a rounded right paratracheal opacity with thickening of the right paratracheal  stripe. Trace left pleural effusion. Persistent trace to small volume right pleural effusion. No pneumothorax. No acute osseous abnormality.  Old healed right rib fractures. IMPRESSION: 1. Rounded right paratracheal opacity with thickening of the right paratracheal stripe. Finding could be related to abnormal vasculature, adenopathy, pleural/pulmonary mass. Recommend correlation with prior cross-sectional imaging. 2. Trace left and trace to small volume right pleural effusions. 3. Right lung patchy airspace opacity. Electronically Signed   By: Iven Finn M.D.   On: 06/23/2020 07:00   DG CHEST PORT 1 VIEW  Result Date: 06/22/2020 CLINICAL DATA:  Shortness of breath. EXAM: PORTABLE CHEST 1 VIEW COMPARISON:  Most recent radiograph 05/13/2020 FINDINGS: Unchanged cardiomegaly. Rounded right paratracheal soft tissue prominence. Right pleural effusion at least moderate in size, slightly increased from prior exam. Left pleural effusion, small to moderate, increased or new. Associated bibasilar opacities likely atelectasis. No pulmonary edema. Remote right rib fractures. IMPRESSION: 1. Bilateral pleural effusions, right greater than left, slightly increased from prior exam. Associated bibasilar opacities likely atelectasis. 2. Stable cardiomegaly. 3. Round right paratracheal prominence, likely overlapping vascular structures, however adenopathy cannot be excluded by radiograph. Electronically Signed   By: Keith Rake M.D.   On: 06/22/2020 00:24   IR THORACENTESIS ASP PLEURAL SPACE W/IMG GUIDE  Result Date: 06/25/2020 INDICATION: History of congestive heart failure, chronic kidney disease, recurrent pleural effusions. Request to IR for right diagnostic and therapeutic thoracentesis. EXAM: ULTRASOUND GUIDED RIGHT THORACENTESIS MEDICATIONS: 10 mL 1% lidocaine COMPLICATIONS: None immediate. PROCEDURE: An ultrasound guided thoracentesis was thoroughly discussed with the patient and questions answered. The  benefits, risks, alternatives and complications were also discussed. The patient understands and wishes to proceed with the procedure. Written consent was obtained. Ultrasound was performed to localize and mark an adequate pocket of fluid in the right chest. The area was then prepped and draped in the normal sterile fashion. 1% Lidocaine was used for local anesthesia. Under ultrasound guidance a 6 Fr Safe-T-Centesis catheter was introduced. Thoracentesis was performed. The catheter was removed and a dressing applied. FINDINGS: A total of approximately 1.0 L of clear yellow fluid was removed. Samples were sent to the laboratory as requested by the clinical team. IMPRESSION: Successful ultrasound guided right thoracentesis yielding 1.0 L of pleural fluid. Read by Candiss Norse, PA-C Electronically Signed   By: Corrie Mckusick D.O.   On: 06/25/2020 12:40      Subjective: No concerns this morning. Wants to go home.  Discharge Exam: Vitals:   07/01/20 0505 07/01/20 0926  BP: (!) 119/49 (!) 133/50  Pulse: (!) 59 60  Resp: 20 17  Temp: 98.3 F (36.8 C) 98.4 F (36.9 C)  SpO2: 96% 97%   Vitals:   06/30/20 1538 06/30/20 1949 07/01/20 0505 07/01/20 0926  BP: (!) 132/50 (!) 127/50 (!) 119/49 (!) 133/50  Pulse: 63 61 (!) 59 60  Resp: 17 17 20 17   Temp: 97.6 F (36.4 C) 97.9 F (36.6 C) 98.3 F (36.8 C) 98.4 F (36.9 C)  TempSrc: Oral Oral Oral Oral  SpO2: 98% 98% 96% 97%  Weight:      Height:        General: Pt is alert, awake, not in acute distress Cardiovascular: RRR, S1/S2 +, no rubs, no gallops Respiratory: CTA bilaterally, no wheezing, no rhonchi Abdominal: Soft, NT, ND, bowel sounds + Extremities: no edema, no cyanosis    The results of significant diagnostics from this hospitalization (including imaging, microbiology, ancillary and laboratory) are listed below for reference.     Microbiology: Recent Results (from the past 240 hour(s))  Gram stain     Status: None    Collection Time: 06/25/20 12:41 PM   Specimen: Lung, Right; Pleural Fluid  Result Value Ref Range Status   Specimen Description PLEURAL FLUID  Final   Special Requests LUNG RIGHT  Final   Gram Stain   Final    WBC PRESENT,BOTH PMN AND MONONUCLEAR NO ORGANISMS SEEN CYTOSPIN SMEAR Performed at [facility], [address], [place], [place] [address]    Report Status 06/25/2020 FINAL  Final  Culture, body fluid w Gram Stain-bottle     Status: None   Collection Time: 06/25/20 12:41 PM   Specimen: Pleura  Result Value Ref Range Status   Specimen Description PLEURAL FLUID  Final   Special Requests LUNG RIGHT  Final   Culture   Final    NO GROWTH 5 DAYS Performed at [facility] [address] [place], [place] [address]    Report Status 06/30/2020 FINAL  Final  Culture, Urine     Status: Abnormal   Collection Time: 06/27/20  1:59 PM   Specimen: Urine, Clean Catch  Result Value Ref Range Status   Specimen Description URINE, CLEAN CATCH  Final   Special Requests   Final    NONE Performed at [facility], [place] [address] [place], [place] [address]    Culture (A)  Final    >=100,000 COLONIES/mL VANCOMYCIN RESISTANT ENTEROCOCCUS ISOLATED   Report Status 06/30/2020 FINAL  Final   Organism ID, Bacteria VANCOMYCIN RESISTANT ENTEROCOCCUS ISOLATED (A)  Final      Susceptibility   Vancomycin resistant enterococcus isolated - MIC*    AMPICILLIN >=32 RESISTANT Resistant     NITROFURANTOIN 128 RESISTANT Resistant     VANCOMYCIN >=32 RESISTANT Resistant     LINEZOLID 2 SENSITIVE Sensitive     * >=100,000 COLONIES/mL VANCOMYCIN RESISTANT ENTEROCOCCUS ISOLATED     Labs: BNP (last 3 results) Recent Labs    04/09/20 0251 04/10/20 0348 05/13/20 1056  BNP 1,085.0* 775.5* 073.7*   Basic Metabolic Panel: Recent Labs  Lab 06/27/20 0039 06/28/20 0237 06/29/20 0118 06/30/20 0033 07/01/20 0057  NA 139 138 137 135 133*  K 4.1 3.6 3.7 3.9 3.5  CL 109 110 108 107 104   CO2 20* 21* 21* 19* 21*  GLUCOSE 158* 138* 119* 107* 106*  BUN 79* 83* 90* 86* 82*  CREATININE 3.28* 3.26* 3.16* 3.19* 2.94*  CALCIUM 8.1* 8.4* 8.1* 8.1* 7.8*   Liver Function Tests: Recent Labs  Lab 06/25/20 0333  PROT 5.9*   No results for input(s): LIPASE, AMYLASE  in the last 168 hours. Recent Labs  Lab 06/25/20 1021  AMMONIA 20   CBC: Recent Labs  Lab 06/26/20 1054 06/28/20 0237 06/29/20 0118 06/30/20 0033 07/01/20 0057  WBC 14.5* 17.5* 16.9* 18.6* 15.2*  HGB 8.7* 9.1* 8.9* 9.0* 8.2*  HCT 28.0* 27.5* 28.6* 27.7* 25.3*  MCV 96.2 91.7 94.1 92.3 93.7  PLT 401* 446* 508* 460* 373   Cardiac Enzymes: No results for input(s): CKTOTAL, CKMB, CKMBINDEX, TROPONINI in the last 168 hours. BNP: Invalid input(s): POCBNP CBG: Recent Labs  Lab 06/28/20 0903  GLUCAP 119*   D-Dimer No results for input(s): DDIMER in the last 72 hours. Hgb A1c No results for input(s): HGBA1C in the last 72 hours. Lipid Profile No results for input(s): CHOL, HDL, LDLCALC, TRIG, CHOLHDL, LDLDIRECT in the last 72 hours. Thyroid function studies No results for input(s): TSH, T4TOTAL, T3FREE, THYROIDAB in the last 72 hours.  Invalid input(s): FREET3 Anemia work up No results for input(s): VITAMINB12, FOLATE, FERRITIN, TIBC, IRON, RETICCTPCT in the last 72 hours. Urinalysis    Component Value Date/Time   COLORURINE YELLOW 06/27/2020 1745   APPEARANCEUR HAZY (A) 06/27/2020 1745   LABSPEC 1.010 06/27/2020 1745   PHURINE 5.0 06/27/2020 1745   GLUCOSEU NEGATIVE 06/27/2020 1745   HGBUR SMALL (A) 06/27/2020 1745   BILIRUBINUR NEGATIVE 06/27/2020 1745   KETONESUR NEGATIVE 06/27/2020 1745   PROTEINUR 100 (A) 06/27/2020 1745   UROBILINOGEN 0.2 12/16/2013 0929   NITRITE NEGATIVE 06/27/2020 1745   LEUKOCYTESUR NEGATIVE 06/27/2020 1745   Sepsis Labs Invalid input(s): PROCALCITONIN,  WBC,  LACTICIDVEN Microbiology Recent Results (from the past 240 hour(s))  Gram stain     Status: None    Collection Time: 06/25/20 12:41 PM   Specimen: Lung, Right; Pleural Fluid  Result Value Ref Range Status   Specimen Description PLEURAL FLUID  Final   Special Requests LUNG RIGHT  Final   Gram Stain   Final    WBC PRESENT,BOTH PMN AND MONONUCLEAR NO ORGANISMS SEEN CYTOSPIN SMEAR Performed at [facility], [address] [place], [place] [address]    Report Status 06/25/2020 FINAL  Final  Culture, body fluid w Gram Stain-bottle     Status: None   Collection Time: 06/25/20 12:41 PM   Specimen: Pleura  Result Value Ref Range Status   Specimen Description PLEURAL FLUID  Final   Special Requests LUNG RIGHT  Final   Culture   Final    NO GROWTH 5 DAYS Performed at [facility] [address] [place], [place] [address]    Report Status 06/30/2020 FINAL  Final  Culture, Urine     Status: Abnormal   Collection Time: 06/27/20  1:59 PM   Specimen: Urine, Clean Catch  Result Value Ref Range Status   Specimen Description URINE, CLEAN CATCH  Final   Special Requests   Final    NONE Performed at [facility], [place] [address], [place], [place] [address]    Culture (A)  Final    >=100,000 COLONIES/mL VANCOMYCIN RESISTANT ENTEROCOCCUS ISOLATED   Report Status 06/30/2020 FINAL  Final   Organism ID, Bacteria VANCOMYCIN RESISTANT ENTEROCOCCUS ISOLATED (A)  Final      Susceptibility   Vancomycin resistant enterococcus isolated - MIC*    AMPICILLIN >=32 RESISTANT Resistant     NITROFURANTOIN 128 RESISTANT Resistant     VANCOMYCIN >=32 RESISTANT Resistant     LINEZOLID 2 SENSITIVE Sensitive     * >=100,000 COLONIES/mL VANCOMYCIN RESISTANT ENTEROCOCCUS  ISOLATED     Time coordinating discharge: 35 minutes  SIGNED:   Cordelia Poche, MD [facility] 07/01/2020, 2:37 PM

## 2020-07-01 NOTE — Progress Notes (Signed)
PT Cancellation Note  Patient Details Name: Brian Wolf MRN: [id number] DOB: 1931/06/08   Cancelled Treatment:    Reason Eval/Treat Not Completed: Other (comment).  Pt was in bed and had been assisted by wife to eat lunch.  Declines PT, reporting he is going home today.  Will retry at another time if pt remains in hosp for any reason.   Ramond Dial 07/01/2020, 12:42 PM   Mee Hives, PT MS Acute Rehab Dept. Number: [place] and [place]

## 2020-07-01 NOTE — Discharge Instructions (Signed)
Brian Wolf,  You were in the hospital with GI bleeding which is likely secondary to your known ulcer. You are discharged on Protonix and Carafate. Please follow-up with the GI physician as an outpatient tomorrow.  You were also here with kidney injury which resolved with IV fluids and adjusting your diuretic medication  You were also here with confusion which improved; this may have been related to your kidney injury, medications, or general hospitalization. You are much better now.  Please follow-up with your PCP

## 2020-07-01 NOTE — Progress Notes (Signed)
Palliative Medicine Inpatient Follow Up Note  Reason for consult:  Goals of Care "Goals of care. Patient declining."  HPI:  Per intake H&P --> Brian Wolf a 87 y.o.male with history of hypertension, hyperlipidemia, dementia, thromboembolic CVA, chronic combined systolic and diastolic heart failure, OSA on CPAP, CKD stage IV, upper GI bleeding secondary to duodenal ulcer. Patient presented secondary to recurrent black stools with concern for upper GI bleeding. Gastroenterology was consulted with recommendations for no repeat endoscopies. Recommendation to restart Protonix therapy. While admitted, patient was noted to have pleural effusion requiring Lasix administration which is led to worsening AKI on CKD stage IV.  Brian Wolf was recently seen by the palliative care team on April 15 at which time it was identified he would go home with hospice.  Palliative care has been reconsulted to further address goals of care in the setting of recurrent hospitalizations & multiple chronic comorbid conditions.  Today's Discussion (07/01/2020):  *Please note that this is a verbal dictation therefore any spelling or grammatical errors are due to the "[place] [employer]" system interpretation.  Chart reviewed.  I met with Brian Wolf and his spouse Brian Wolf at bedside.  Brian Wolf shares with me that she had just spoken with Dr. Lonny Prude and there are plans for Brian Wolf to discharge home today.  I shared with Brian Wolf that I had reviewed both advanced directives and it does appear that hers signed in September 2021 is the most current.  We reviewed Brian Wolf's present conditions in the healthcare needs he may have moving forward.  Brian Wolf shares with me that right now she is not wishing to pursue hospice though she may get to a point where she would want to in which case she would wish for a company aside from [employer]  I offered to provide consultation to the office hospice of the [place] though she requested  not to pursue this now and that if she is ready for this moving forward she will contact Brian Wolf to help make a referral/offer additional guidance.  Brian Wolf was very thankful for her time this morning and is looking forward to getting her husband home this afternoon.  Questions and concerns addressed   Objective Assessment: Vital Signs Vitals:   07/01/20 0505 07/01/20 0926  BP: (!) 119/49 (!) 133/50  Pulse: (!) 59 60  Resp: 20 17  Temp: 98.3 F (36.8 C) 98.4 F (36.9 C)  SpO2: 96% 97%    Intake/Output Summary (Last 24 hours) at 07/01/2020 1043 Last data filed at 07/01/2020 1019 Gross per 24 hour  Intake 337 ml  Output 801 ml  Net -464 ml   Last Weight  Most recent update: 06/29/2020  6:50 AM   Weight  65.6 kg (144 lb 10 oz)           Gen: Frail elderly Asian male in no acute distress HEENT: moist mucous membranes CV: Regular rate and rhythm PULM: On 1 L nasal cannula ABD: soft/nontende EXT: No edema Neuro: Alert and oriented x3  SUMMARY OF RECOMMENDATIONS   DNAR/DNI  MOST Completed, paper copy placed onto the chart electric copy can be found in [facility]  DNR Form Completed, paper copy placed onto the chart electric copy can be found in [place] for transition home today. Patients wife shares that she will communicate with the Brian Wolf PMT if she has additional questions about hospice moving forward.  Time Spent: 25 Greater than 50% of the time was spent in counseling and coordination of care ______________________________________________________________________________________ Sharyn Lull  [place] Team Team Cell Phone: [phone] Please utilize secure chat with additional questions, if there is no response within 30 minutes please call the above phone number  Palliative Medicine Team providers are available by phone from 7am to 7pm daily and can be reached through the team cell phone.  Should this patient require assistance outside  of these hours, please call the patient's attending physician.

## 2020-07-02 DIAGNOSIS — R9430 Abnormal result of cardiovascular function study, unspecified: Secondary | ICD-10-CM | POA: Diagnosis not present

## 2020-07-02 DIAGNOSIS — R195 Other fecal abnormalities: Secondary | ICD-10-CM | POA: Diagnosis not present

## 2020-07-02 DIAGNOSIS — I420 Dilated cardiomyopathy: Secondary | ICD-10-CM | POA: Diagnosis not present

## 2020-07-02 DIAGNOSIS — N184 Chronic kidney disease, stage 4 (severe): Secondary | ICD-10-CM | POA: Diagnosis not present

## 2020-07-02 DIAGNOSIS — D62 Acute posthemorrhagic anemia: Secondary | ICD-10-CM | POA: Diagnosis not present

## 2020-07-02 DIAGNOSIS — Z8719 Personal history of other diseases of the digestive system: Secondary | ICD-10-CM | POA: Diagnosis not present

## 2020-07-07 DIAGNOSIS — I129 Hypertensive chronic kidney disease with stage 1 through stage 4 chronic kidney disease, or unspecified chronic kidney disease: Secondary | ICD-10-CM | POA: Diagnosis not present

## 2020-07-07 DIAGNOSIS — Z09 Encounter for follow-up examination after completed treatment for conditions other than malignant neoplasm: Secondary | ICD-10-CM | POA: Diagnosis not present

## 2020-07-07 DIAGNOSIS — I429 Cardiomyopathy, unspecified: Secondary | ICD-10-CM | POA: Diagnosis not present

## 2020-07-07 DIAGNOSIS — Z8719 Personal history of other diseases of the digestive system: Secondary | ICD-10-CM | POA: Diagnosis not present

## 2020-07-07 DIAGNOSIS — Z862 Personal history of diseases of the blood and blood-forming organs and certain disorders involving the immune mechanism: Secondary | ICD-10-CM | POA: Diagnosis not present

## 2020-07-16 LAB — VITAMIN B1: Vitamin B1 (Thiamine): 95.4 nmol/L (ref 66.5–200.0)

## 2020-07-24 DIAGNOSIS — G4733 Obstructive sleep apnea (adult) (pediatric): Secondary | ICD-10-CM | POA: Diagnosis not present

## 2020-07-24 DIAGNOSIS — D62 Acute posthemorrhagic anemia: Secondary | ICD-10-CM | POA: Diagnosis not present

## 2020-07-24 DIAGNOSIS — R600 Localized edema: Secondary | ICD-10-CM | POA: Diagnosis not present

## 2020-07-24 DIAGNOSIS — I610 Nontraumatic intracerebral hemorrhage in hemisphere, subcortical: Secondary | ICD-10-CM | POA: Diagnosis not present

## 2020-08-23 DIAGNOSIS — G4733 Obstructive sleep apnea (adult) (pediatric): Secondary | ICD-10-CM | POA: Diagnosis not present

## 2020-08-23 DIAGNOSIS — I610 Nontraumatic intracerebral hemorrhage in hemisphere, subcortical: Secondary | ICD-10-CM | POA: Diagnosis not present

## 2020-08-23 DIAGNOSIS — D62 Acute posthemorrhagic anemia: Secondary | ICD-10-CM | POA: Diagnosis not present

## 2020-08-23 DIAGNOSIS — R600 Localized edema: Secondary | ICD-10-CM | POA: Diagnosis not present

## 2020-09-03 DIAGNOSIS — H02042 Spastic entropion of right lower eyelid: Secondary | ICD-10-CM | POA: Diagnosis not present

## 2020-09-03 DIAGNOSIS — H1131 Conjunctival hemorrhage, right eye: Secondary | ICD-10-CM | POA: Diagnosis not present

## 2020-09-03 DIAGNOSIS — H04123 Dry eye syndrome of bilateral lacrimal glands: Secondary | ICD-10-CM | POA: Diagnosis not present

## 2020-09-03 DIAGNOSIS — H02045 Spastic entropion of left lower eyelid: Secondary | ICD-10-CM | POA: Diagnosis not present

## 2020-09-10 DIAGNOSIS — I5042 Chronic combined systolic (congestive) and diastolic (congestive) heart failure: Secondary | ICD-10-CM | POA: Diagnosis not present

## 2020-09-10 DIAGNOSIS — N184 Chronic kidney disease, stage 4 (severe): Secondary | ICD-10-CM | POA: Diagnosis not present

## 2020-09-10 DIAGNOSIS — I129 Hypertensive chronic kidney disease with stage 1 through stage 4 chronic kidney disease, or unspecified chronic kidney disease: Secondary | ICD-10-CM | POA: Diagnosis not present

## 2020-09-10 DIAGNOSIS — D631 Anemia in chronic kidney disease: Secondary | ICD-10-CM | POA: Diagnosis not present

## 2020-09-23 DIAGNOSIS — R600 Localized edema: Secondary | ICD-10-CM | POA: Diagnosis not present

## 2020-09-23 DIAGNOSIS — G4733 Obstructive sleep apnea (adult) (pediatric): Secondary | ICD-10-CM | POA: Diagnosis not present

## 2020-09-23 DIAGNOSIS — I610 Nontraumatic intracerebral hemorrhage in hemisphere, subcortical: Secondary | ICD-10-CM | POA: Diagnosis not present

## 2020-09-23 DIAGNOSIS — D62 Acute posthemorrhagic anemia: Secondary | ICD-10-CM | POA: Diagnosis not present

## 2020-10-24 DIAGNOSIS — I610 Nontraumatic intracerebral hemorrhage in hemisphere, subcortical: Secondary | ICD-10-CM | POA: Diagnosis not present

## 2020-10-24 DIAGNOSIS — D62 Acute posthemorrhagic anemia: Secondary | ICD-10-CM | POA: Diagnosis not present

## 2020-10-24 DIAGNOSIS — R600 Localized edema: Secondary | ICD-10-CM | POA: Diagnosis not present

## 2020-10-24 DIAGNOSIS — G4733 Obstructive sleep apnea (adult) (pediatric): Secondary | ICD-10-CM | POA: Diagnosis not present

## 2020-11-10 ENCOUNTER — Other Ambulatory Visit: Payer: Self-pay

## 2020-11-10 ENCOUNTER — Emergency Department (HOSPITAL_COMMUNITY)
Admission: EM | Admit: 2020-11-10 | Discharge: 2020-11-10 | Disposition: A | Discharge status: Home / Self Care | Payer: Medicare Other | Attending: Emergency Medicine | Admitting: Emergency Medicine

## 2020-11-10 ENCOUNTER — Encounter (HOSPITAL_COMMUNITY): Payer: Self-pay | Admitting: Emergency Medicine

## 2020-11-10 DIAGNOSIS — R55 Syncope and collapse: Secondary | ICD-10-CM | POA: Insufficient documentation

## 2020-11-10 DIAGNOSIS — I130 Hypertensive heart and chronic kidney disease with heart failure and stage 1 through stage 4 chronic kidney disease, or unspecified chronic kidney disease: Secondary | ICD-10-CM | POA: Insufficient documentation

## 2020-11-10 DIAGNOSIS — I10 Essential (primary) hypertension: Secondary | ICD-10-CM | POA: Diagnosis not present

## 2020-11-10 DIAGNOSIS — F0390 Unspecified dementia without behavioral disturbance: Secondary | ICD-10-CM | POA: Diagnosis not present

## 2020-11-10 DIAGNOSIS — N184 Chronic kidney disease, stage 4 (severe): Secondary | ICD-10-CM | POA: Insufficient documentation

## 2020-11-10 DIAGNOSIS — I5042 Chronic combined systolic (congestive) and diastolic (congestive) heart failure: Secondary | ICD-10-CM | POA: Insufficient documentation

## 2020-11-10 DIAGNOSIS — Z743 Need for continuous supervision: Secondary | ICD-10-CM | POA: Diagnosis not present

## 2020-11-10 DIAGNOSIS — R6889 Other general symptoms and signs: Secondary | ICD-10-CM | POA: Diagnosis not present

## 2020-11-10 DIAGNOSIS — R0902 Hypoxemia: Secondary | ICD-10-CM | POA: Diagnosis not present

## 2020-11-10 DIAGNOSIS — R404 Transient alteration of awareness: Secondary | ICD-10-CM | POA: Diagnosis not present

## 2020-11-10 LAB — I-STAT CHEM 8, ED
BUN: 81 mg/dL — ABNORMAL HIGH (ref 8–23)
Calcium, Ion: 1 mmol/L — ABNORMAL LOW (ref 1.15–1.40)
Chloride: 95 mmol/L — ABNORMAL LOW (ref 98–111)
Creatinine, Ser: 3.3 mg/dL — ABNORMAL HIGH (ref 0.61–1.24)
Glucose, Bld: 109 mg/dL — ABNORMAL HIGH (ref 70–99)
HCT: 34 % — ABNORMAL LOW (ref 39.0–52.0)
Hemoglobin: 11.6 g/dL — ABNORMAL LOW (ref 13.0–17.0)
Potassium: 4.2 mmol/L (ref 3.5–5.1)
Sodium: 135 mmol/L (ref 135–145)
TCO2: 29 mmol/L (ref 22–32)

## 2020-11-10 NOTE — ED Triage Notes (Signed)
Patient arrived with EMS from home family reported brief syncopal episode at home this evening in the bathroom , denies injury , they added that patient has been increasingly getting weak this week . Hospice service/DNR . Alert and oriented at arrival/respirations unlabored.

## 2020-11-10 NOTE — ED Notes (Signed)
Pt refused to undergo Orthostatic vitals. RN notified

## 2020-11-10 NOTE — ED Provider Notes (Signed)
[facility] EMERGENCY DEPARTMENT Provider Note  CSN: [id number] Arrival date & time: 11/10/20 0106    Chief Complaint(s) Gen. Weakness/Syncope ( Hospice/DNR)  HPI Brian Wolf is a 87 y.o. male with extensive past medical history listed below who is on hospice care presents by EMS after near syncopal episode at home.  Physically Brian patient has been declining for Brian past several months.  Wife reports that she took Brian patient to Brian restroom where he had a bowel movement.  He was complaining of lower extremity numbness and tingling after Brian bowel movement.  Patient was able to stand and bend over Brian sink while she cleaned them.  During this time she noted that patient was sliding down.  She was able to catch them and help them to Brian floor.  He did not hit his head.  She noted that he was minimally responsive prompting her to call EMS and Brian hospice on-call nurse.  When EMS arrived they noted patient's blood pressure was in Brian 60s.  Brian decision was made to have Brian patient brought him in for evaluation.  Patient currently denies any physical complaints.  He does have a history of dementia but is aware of Brian situation including that he is in Brian hospital, today is Sunday, and his wife recently had shoulder surgery and was not able to drive here.     HPI  Past Medical History Past Medical History:  Diagnosis Date   Chronic combined systolic and diastolic CHF (congestive heart failure) (HCC)    CVA (cerebral infarction) 12/2013   Dementia (HCC)    Dyslipidemia    Gastric ulcer    prior to 2000   Hypertension    OSA on CPAP    Sleep difficulties    Stage 4 chronic kidney disease ([place])    Patient Active Problem List   Diagnosis Date Noted   GIB (gastrointestinal bleeding) 06/21/2020   Acute upper GI bleeding 06/20/2020   Hypertension    Dementia (HCC)    Chronic combined systolic and diastolic CHF (congestive heart failure) (HCC)    OSA on CPAP     Stage 4 chronic kidney disease ([place])    New onset atrial fibrillation (HCC)    CHF (congestive heart failure) ([place]) 05/13/2020   Bronchitis with acute wheezing 05/13/2020   Leukocytosis 05/13/2020   Elevated troponin 05/13/2020   CKD (chronic kidney disease), stage IV ([place]) 05/13/2020   DNR (do not resuscitate) 05/13/2020   Sensorineural hearing loss, bilateral 05/01/2020   Hyperlipidemia 05/01/2020   Benign neoplasm of colon 05/01/2020   Benign prostatic hyperplasia 05/01/2020   Cardiomyopathy ([place]) 05/01/2020   Dyslipidemia 05/01/2020   Glaucoma 05/01/2020   History of anemia 05/01/2020   Hypertensive retinopathy 05/01/2020   Malignant hypertensive chronic kidney disease 05/01/2020   Personal history of other diseases of Brian digestive system 05/01/2020   Sinus node dysfunction (HCC) 05/01/2020   Malnutrition of moderate degree 04/14/2020   SOB (shortness of breath)    Bilateral lower extremity edema 04/05/2020   Acute blood loss anemia 04/05/2020   Pleural effusion 37/11/6267   Acute diastolic CHF (congestive heart failure) ([place]) 04/05/2020   Acute upper GI bleed 10/17/2019   Hyperkalemia 10/17/2019   Acute kidney injury superimposed on chronic kidney disease ([place]) 10/17/2019   Fall    Personal history of transient ischemic attack (TIA), and cerebral infarction without residual deficits 03/09/2016   Obstructive sleep apnea syndrome 03/09/2016   SAH (subarachnoid hemorrhage) ([place]) 03/09/2016  Subarachnoid bleed ([place]) 03/08/2016   Hypertension, essential    Sleep difficulties    Homonymous hemianopsia 01/14/2014   Homonymous hemianopsia due to recent cerebral infarction 12/24/2013   Left-sided neglect 12/24/2013   Gout flare 12/20/2013   Benign essential HTN 12/20/2013   Acute hemorrhagic infarction of brain ([place]) 12/20/2013   Hemorrhagic stroke ([place])    Gout    Hypokalemia 12/19/2013   Altered mental status    Encounter for immunization    Encephalopathy acute    ICH  (intracerebral hemorrhage) ([place]) 12/14/2013   Fever    Home Medication(s) Prior to Admission medications   Medication Sig Start Date End Date Taking? Authorizing Provider  Cholecalciferol 50 MCG (2000 UT) TABS Take 2,000 Units by mouth daily. 05/28/20   [provider]  fexofenadine (ALLEGRA) 180 MG tablet Take 180 mg by mouth in Brian morning.    [provider]  furosemide (LASIX) 20 MG tablet Take 3 tablets (60 mg total) by mouth daily. 07/02/20 09/30/20  Mariel Aloe, MD  hydrALAZINE (APRESOLINE) 50 MG tablet Take 1 tablet (50 mg total) by mouth 3 (three) times daily. 11/21/19   Cantwell, Celeste C, PA-C  isosorbide dinitrate (ISORDIL) 30 MG tablet Take 1 tablet (30 mg total) by mouth 3 (three) times daily. 09/03/19   Adrian Prows, MD  levothyroxine (SYNTHROID) 25 MCG tablet Take 25 mcg by mouth See admin instructions. Take by mouth every morning before a meal 05/27/20   [provider]  metoprolol succinate (TOPROL-XL) 50 MG 24 hr tablet Take 50 mg by mouth daily.    [provider]  mupirocin ointment (BACTROBAN) 2 % Apply 1 application topically daily as needed for irritation. 05/27/20   [provider]  NIFEdipine (ADALAT CC) 60 MG 24 hr tablet Take 60 mg by mouth at bedtime.    [provider]  ondansetron (ZOFRAN ODT) 4 MG disintegrating tablet Take 1 tablet (4 mg total) by mouth every 8 (eight) hours as needed for nausea or vomiting. 06/15/20   Margarita Mail, PA-C  pantoprazole (PROTONIX) 40 MG tablet Take 1 tablet (40 mg total) by mouth 2 (two) times daily for 31 days, THEN 1 tablet (40 mg total) daily. 07/01/20 09/30/20  Mariel Aloe, MD  potassium chloride SA (KLOR-CON) 20 MEQ tablet Take 1 tablet (20 mEq total) by mouth 2 (two) times daily. 05/16/20 06/15/20  Damita Lack, MD  Probiotic Product (PROBIOTIC DAILY PO) Take 1 capsule by mouth in Brian morning.    [provider]  simvastatin (ZOCOR) 40 MG tablet Take 40 mg by  mouth at bedtime. 11/30/13   [provider]  sucralfate (CARAFATE) 1 g tablet Take 1 tablet (1 g total) by mouth 4 (four) times daily -  with meals and at bedtime for 3 days. 07/01/20 07/04/20  Mariel Aloe, MD  tamsulosin (FLOMAX) 0.4 MG CAPS capsule Take 0.4 mg by mouth daily after supper. 06/03/16   [provider]  Travoprost, BAK Free, (TRAVATAN) 0.004 % SOLN ophthalmic solution Place 1 drop into both eyes every evening.    [provider]  Past Surgical History Past Surgical History:  Procedure Laterality Date   ABDOMINAL SURGERY     BIOPSY  04/06/2020   Procedure: BIOPSY;  Surgeon: Ronald Lobo, MD;  Location: [facility];  Service: Endoscopy;;   CATARACT EXTRACTION Left    ESOPHAGOGASTRODUODENOSCOPY N/A 10/18/2019   Procedure: ESOPHAGOGASTRODUODENOSCOPY (EGD);  Surgeon: Wonda Horner, MD;  Location: [facility] ENDOSCOPY;  Service: Endoscopy;  Laterality: N/A;   ESOPHAGOGASTRODUODENOSCOPY (EGD) WITH PROPOFOL N/A 04/06/2020   Procedure: ESOPHAGOGASTRODUODENOSCOPY (EGD) WITH PROPOFOL;  Surgeon: Ronald Lobo, MD;  Location: [facility];  Service: Endoscopy;  Laterality: N/A;   IR THORACENTESIS ASP PLEURAL SPACE W/IMG GUIDE  04/08/2020   IR THORACENTESIS ASP PLEURAL SPACE W/IMG GUIDE  06/25/2020   Family History Family History  Problem Relation Age of Onset   Hyperlipidemia Mother    Hypertension Mother    Hypertension Father     Social History Social History   Tobacco Use   Smoking status: Never   Smokeless tobacco: Never  Vaping Use   Vaping Use: Never used  Substance Use Topics   Alcohol use: No    Alcohol/week: 0.0 standard drinks   Drug use: No   Allergies Lisinopril and Nsaids  Review of Systems Review of Systems All other systems are reviewed and are negative for acute change except as noted in Brian HPI  Physical  Exam Vital Signs  I have reviewed Brian triage vital signs BP (!) 157/65 (BP Location: Right Arm)   Pulse 85   Temp 98.5 F (36.9 C) (Oral)   Resp 18   Ht 5\' 4"  (1.626 m)   Wt 78 kg   SpO2 99%   BMI 29.52 kg/m   Physical Exam Vitals reviewed.  Constitutional:      General: He is not in acute distress.    Appearance: He is well-developed. He is not diaphoretic.  HENT:     Head: Normocephalic and atraumatic.     Nose: Nose normal.  Eyes:     General: No scleral icterus.       Right eye: No discharge.        Left eye: No discharge.     Conjunctiva/sclera: Conjunctivae normal.     Pupils: Pupils are equal, round, and reactive to light.  Cardiovascular:     Rate and Rhythm: Normal rate and regular rhythm.     Heart sounds: No murmur heard.   No friction rub. No gallop.  Pulmonary:     Effort: Pulmonary effort is normal. No respiratory distress.     Breath sounds: Normal breath sounds. No stridor. No rales.  Abdominal:     General: There is no distension.     Palpations: Abdomen is soft.     Tenderness: There is no abdominal tenderness.  Musculoskeletal:        General: No tenderness.     Cervical back: Normal range of motion and neck supple.  Skin:    General: Skin is warm and dry.     Findings: No erythema or rash.  Neurological:     Mental Status: He is alert and oriented to person, place, and time.    ED Results and Treatments Labs (all labs ordered are listed, but only abnormal results are displayed) Labs Reviewed  I-STAT CHEM 8, ED - Abnormal; Notable for Brian following components:      Result Value   Chloride 95 (*)    BUN 81 (*)    Creatinine, Ser 3.30 (*)    Glucose, Bld 109 (*)  Calcium, Ion 1.00 (*)    Hemoglobin 11.6 (*)    HCT 34.0 (*)    All other components within normal limits                                                                                                                         EKG  EKG Interpretation  Date/Time:     Ventricular Rate:    PR Interval:    QRS Duration:   QT Interval:    QTC Calculation:   R Axis:     Text Interpretation:         Radiology No results found.  Pertinent labs & imaging results that were available during my care of Brian patient were reviewed by me and considered in my medical decision making (see MDM for details).  Medications Ordered in ED Medications - No data to display                                                                                                                                   Procedures Procedures  (including critical care time)  Medical Decision Making / ED Course I have reviewed Brian nursing notes for this encounter and Brian patient's prior records (if available in EHR or on provided paperwork).  Brian Wolf was evaluated in Emergency Department on 11/10/2020 for Brian symptoms described in Brian history of present illness. He was evaluated in Brian context of Brian global COVID-19 pandemic, which necessitated consideration that Brian patient might be at risk for infection with Brian SARS-CoV-2 virus that causes COVID-19. Institutional protocols and algorithms that pertain to Brian evaluation of patients at risk for COVID-19 are in a state of rapid change based on information released by regulatory bodies including Brian CDC and federal and state organizations. These policies and algorithms were followed during Brian patient's care in Brian ED.     With shared decision making, we decided to obtain screening labs and an EKG. There is no evidence of trauma on exam.  Pertinent labs & imaging results that were available during my care of Brian patient were reviewed by me and considered in my medical decision making:  Patient's renal function is close to his baseline.  Hemoglobin has improved from several months ago. EKG unchanged without significant dysrhythmias or high grade blocks.  No evidence of acute ischemia.  Patient's blood pressure remained  stable.  His presentation is  likely related to vasovagal.  Wife called and updated on results.  Final Clinical Impression(s) / ED Diagnoses Final diagnoses:  Vasovagal near syncope   Brian patient appears reasonably screened and/or stabilized for discharge and I doubt any other medical condition or other [facility] requiring further screening, evaluation, or treatment in Brian ED at this time prior to discharge. Safe for discharge with strict return precautions.  Disposition: Discharge  Condition: Good  I have discussed Brian results, Dx and Tx plan with Brian patient/family who expressed understanding and agree(s) with Brian plan. Discharge instructions discussed at length. Brian patient/family was given strict return precautions who verbalized understanding of Brian instructions. No further questions at time of discharge.    ED Discharge Orders     None         Follow Up: [name], [address] [place] [id number] [phone]  Call  as needed     This chart was dictated using voice recognition software.  Despite best efforts to proofread,  errors can occur which can change Brian documentation meaning.    Fatima Blank, MD 11/10/20 [phone]

## 2020-11-10 NOTE — ED Notes (Signed)
PTAR notified by secretary for patient's transport back home .

## 2020-11-10 NOTE — ED Notes (Signed)
Patient cleaned prior to dc patient had soiled himself.

## 2020-11-23 DIAGNOSIS — G4733 Obstructive sleep apnea (adult) (pediatric): Secondary | ICD-10-CM | POA: Diagnosis not present

## 2020-11-23 DIAGNOSIS — D62 Acute posthemorrhagic anemia: Secondary | ICD-10-CM | POA: Diagnosis not present

## 2020-11-23 DIAGNOSIS — I610 Nontraumatic intracerebral hemorrhage in hemisphere, subcortical: Secondary | ICD-10-CM | POA: Diagnosis not present

## 2020-11-23 DIAGNOSIS — R600 Localized edema: Secondary | ICD-10-CM | POA: Diagnosis not present

## 2020-12-24 DIAGNOSIS — I610 Nontraumatic intracerebral hemorrhage in hemisphere, subcortical: Secondary | ICD-10-CM | POA: Diagnosis not present

## 2020-12-24 DIAGNOSIS — G4733 Obstructive sleep apnea (adult) (pediatric): Secondary | ICD-10-CM | POA: Diagnosis not present

## 2020-12-24 DIAGNOSIS — D62 Acute posthemorrhagic anemia: Secondary | ICD-10-CM | POA: Diagnosis not present

## 2020-12-24 DIAGNOSIS — R600 Localized edema: Secondary | ICD-10-CM | POA: Diagnosis not present

## 2021-01-01 DEATH — deceased

## 2021-02-10 ENCOUNTER — Ambulatory Visit: Payer: Medicare Other | Admitting: Adult Health

## 2022-08-13 IMAGING — DX DG CHEST 1V PORT
1 series · 1 of 1 positions shown · non-contrast
Comparison: 03/11/2016 chest radiograph

CLINICAL DATA: Shortness of breath

EXAM:
PORTABLE CHEST 1 VIEW

[chest ap]
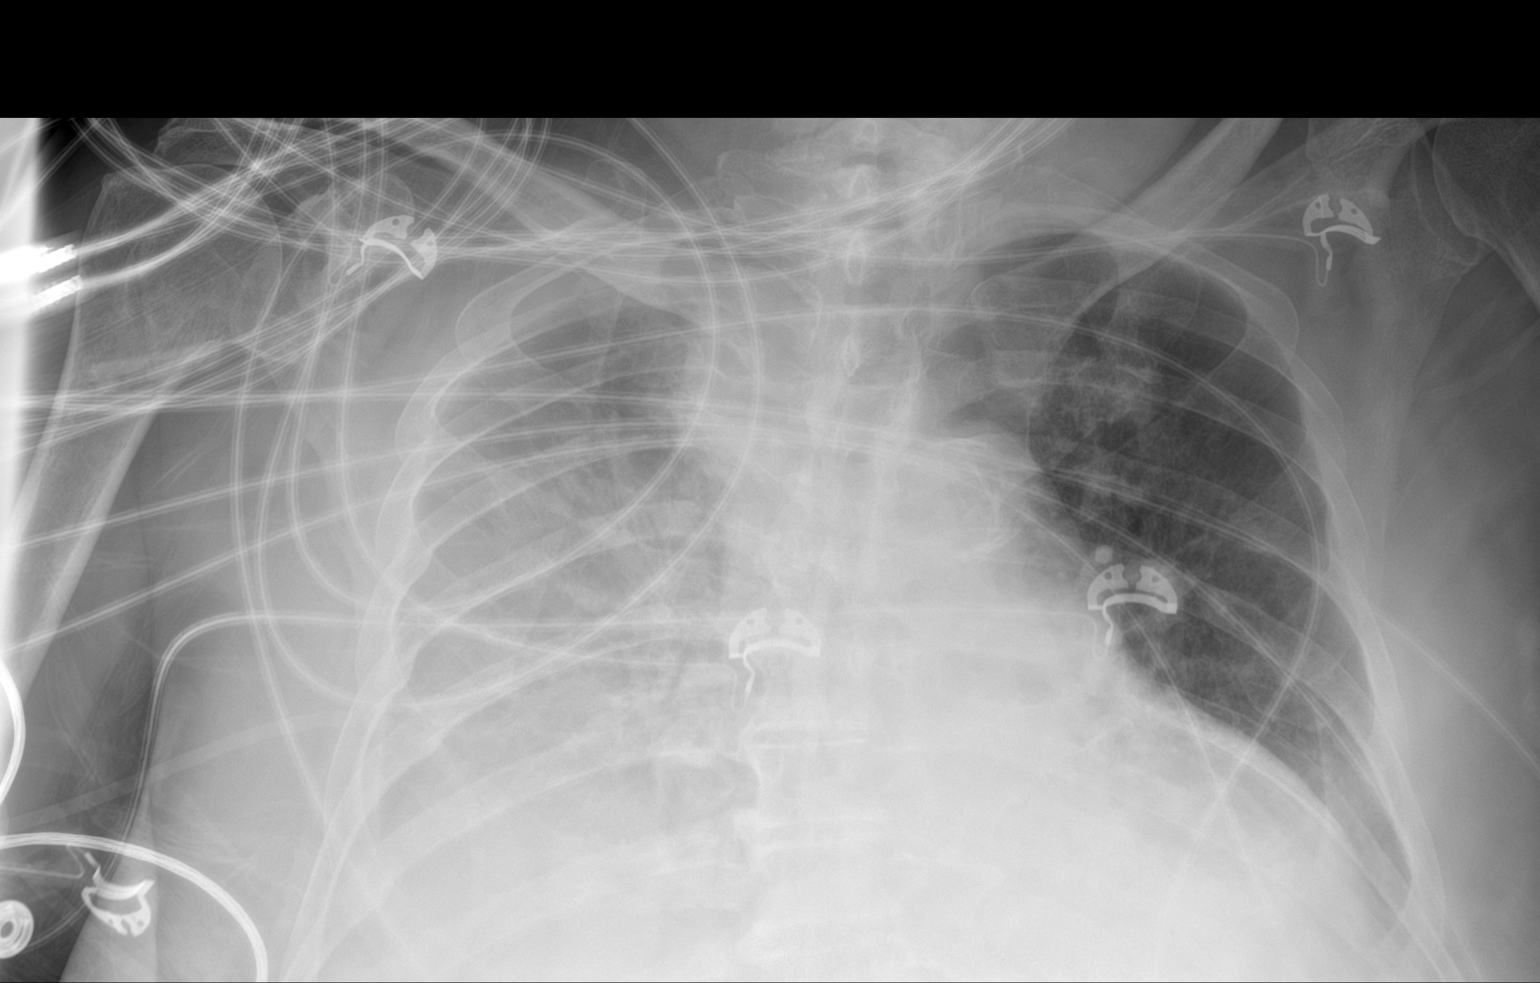

[1 of 1 positions shown; findings below may reference images not displayed]

FINDINGS: Increased density overlying the RIGHT hemithorax likely represents a
pleural effusion.

Cardiomediastinal silhouette is unchanged with fullness in the UPPER
RIGHT mediastinum.

Pulmonary vascular congestion is noted.

There may be bilateral LOWER lung opacities/atelectasis present.

There is no evidence of pneumothorax.

Remote RIGHT rib fractures are present.
IMPRESSION: 1. Increased density overlying the RIGHT hemithorax likely
representing pleural effusion.
2. Pulmonary vascular congestion and possible bilateral LOWER lung
opacities/atelectasis.

## 2022-08-14 IMAGING — DX DG CHEST 1V PORT
1 series · 1 of 1 positions shown · non-contrast
Comparison: 04/05/2020

CLINICAL DATA: Dyspnea

EXAM:
PORTABLE CHEST 1 VIEW

[chest ap]
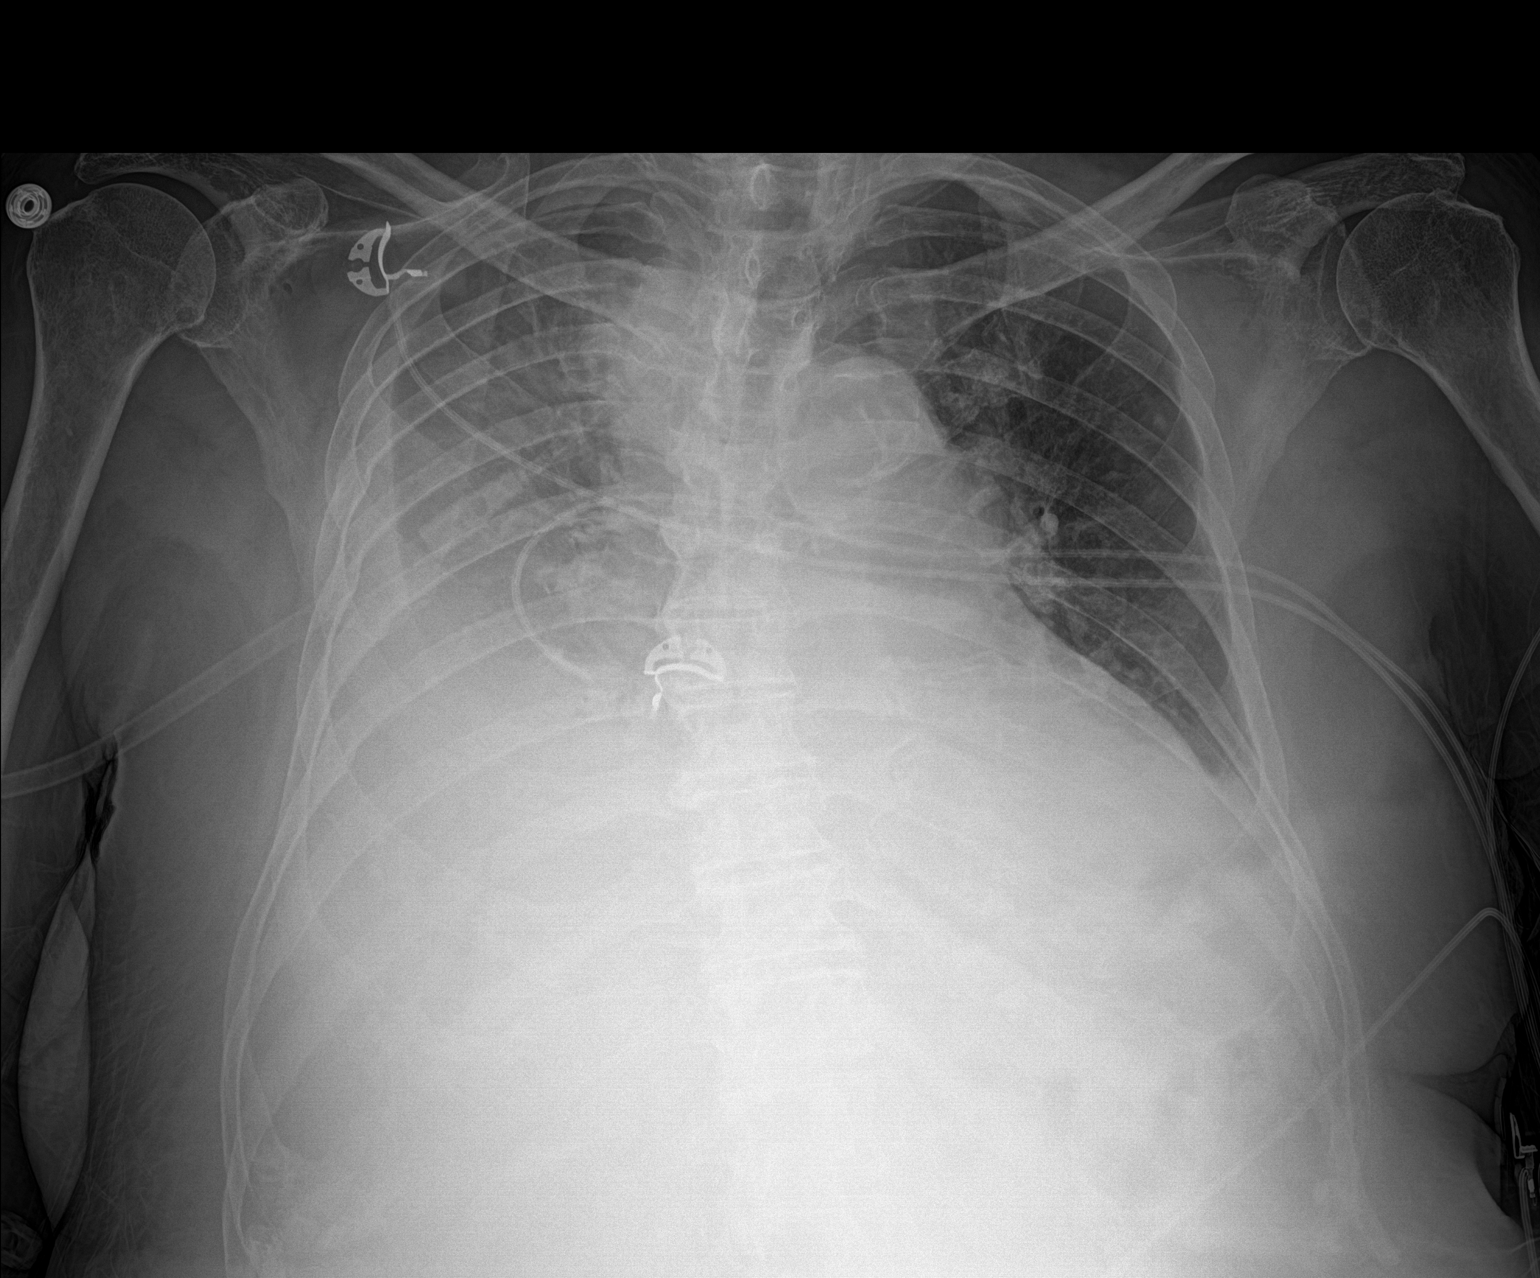

[1 of 1 positions shown; findings below may reference images not displayed]

FINDINGS: Large right pleural effusion is unchanged with compressive
atelectasis of the right lung base and opacification of the right
hemithorax. Small left pleural effusion is present with persistent
retrocardiac atelectasis. Right paratracheal opacity is unchanged
from multiple prior examinations likely representing vascular
shadow. Cardiac size within normal limits. The pulmonary vascularity
is normal. No acute bone abnormality. Multiple healed right rib
fractures are noted.
IMPRESSION: Stable large right and small left pleural effusions with associated
bibasilar compressive atelectasis.

## 2022-08-15 IMAGING — DX DG CHEST 1V PORT
1 series · 1 of 1 positions shown · non-contrast
Comparison: 04/06/2020 and earlier.

CLINICAL DATA: Follow-up effusions and atelectasis versus
pneumonia.

EXAM:
PORTABLE CHEST 1 VIEW

[chest ap]
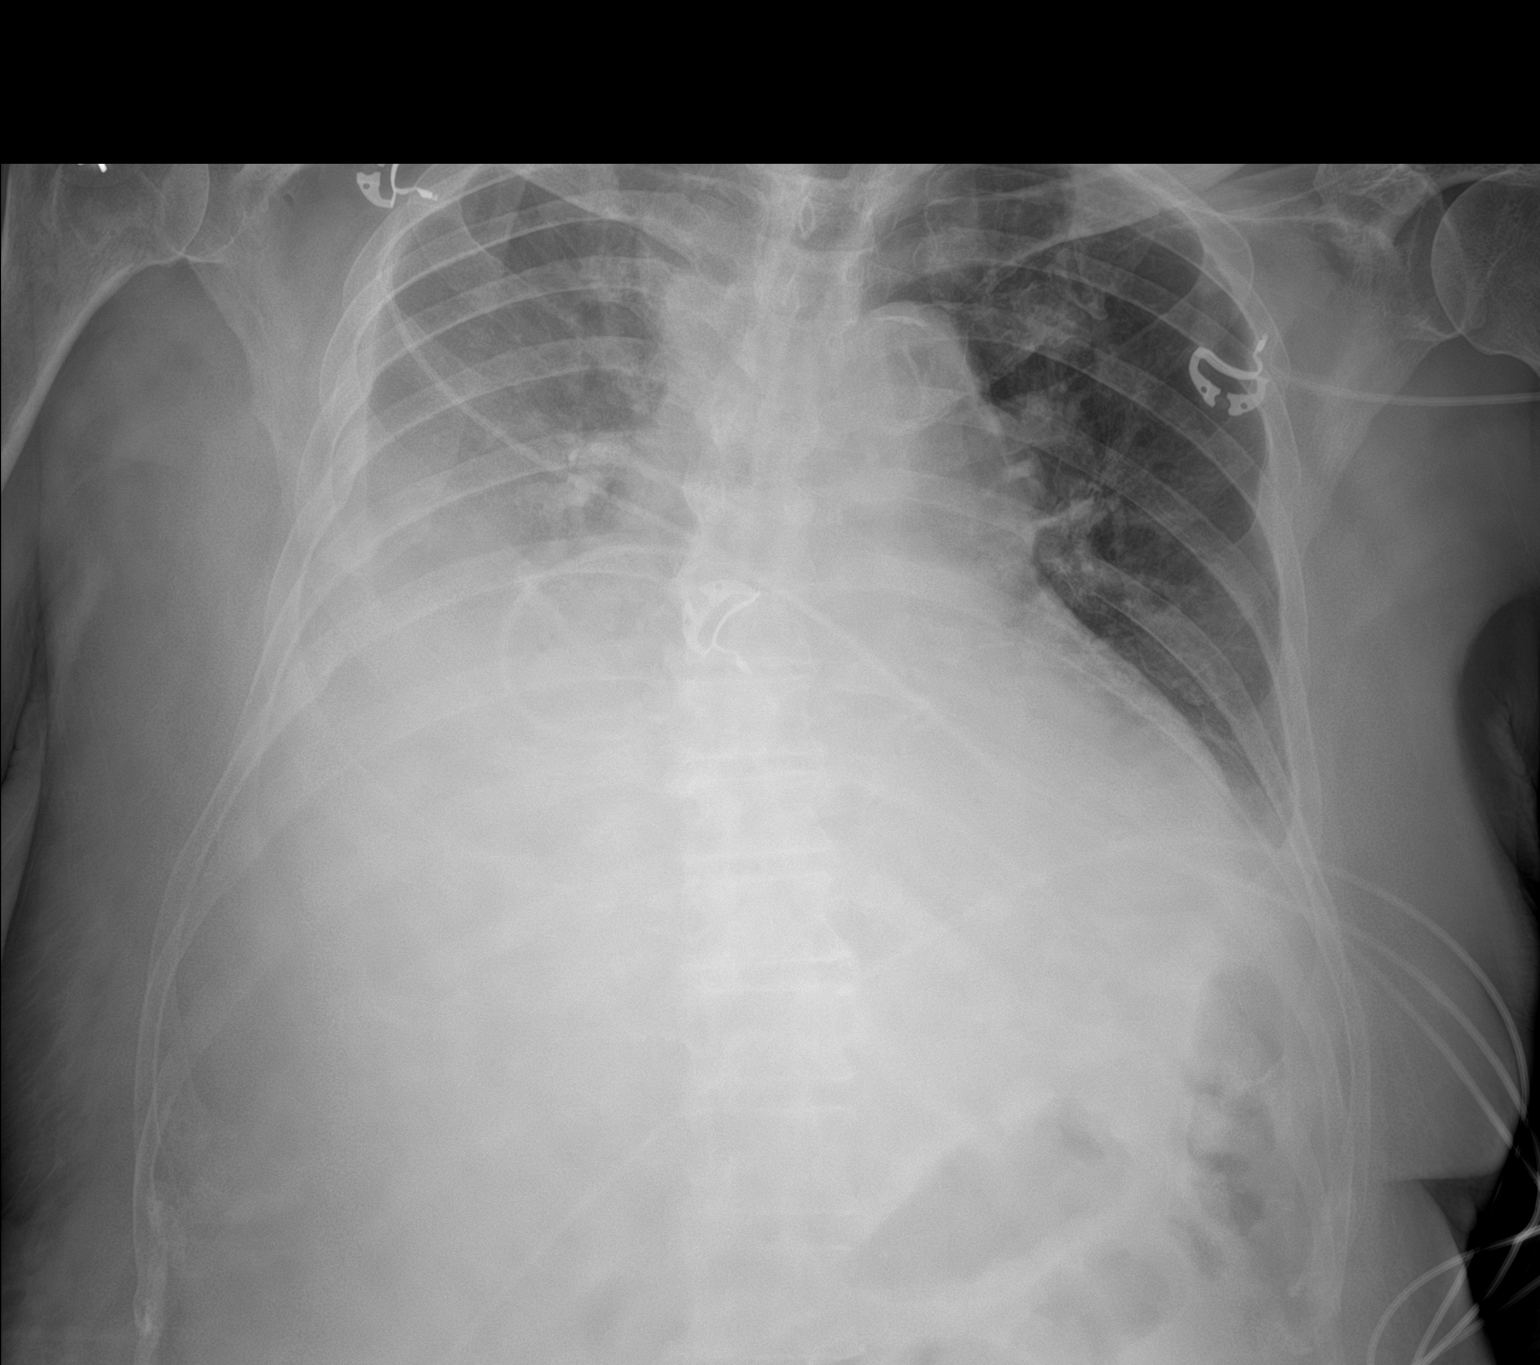

[1 of 1 positions shown; findings below may reference images not displayed]

FINDINGS: Cardiac silhouette mildly enlarged, unchanged. Pulmonary vascularity
normal without evidence of pulmonary edema. Large RIGHT pleural
effusion with associated dense consolidation in the RIGHT MIDDLE
LOBE and RIGHT LOWER LOBE, unchanged. Small LEFT pleural effusion
with associated consolidation in the LEFT LOWER LOBE, unchanged. No
new abnormalities.
IMPRESSION: 1. Stable large RIGHT pleural effusion and associated dense
atelectasis and/or pneumonia in the RIGHT MIDDLE LOBE and RIGHT
LOWER LOBE.
2. Stable small LEFT pleural effusion and associated passive
atelectasis and/or pneumonia in the LEFT LOWER LOBE.
3. No new abnormalities.

## 2022-08-16 IMAGING — US US RENAL
1 series · 14 of 25 positions shown · non-contrast
Comparison: None.

CLINICAL DATA: Acute renal injury

EXAM:
RENAL / URINARY TRACT ULTRASOUND COMPLETE

[Series 1: us renal · 14 of 53 slices shown]
[im 1/53]
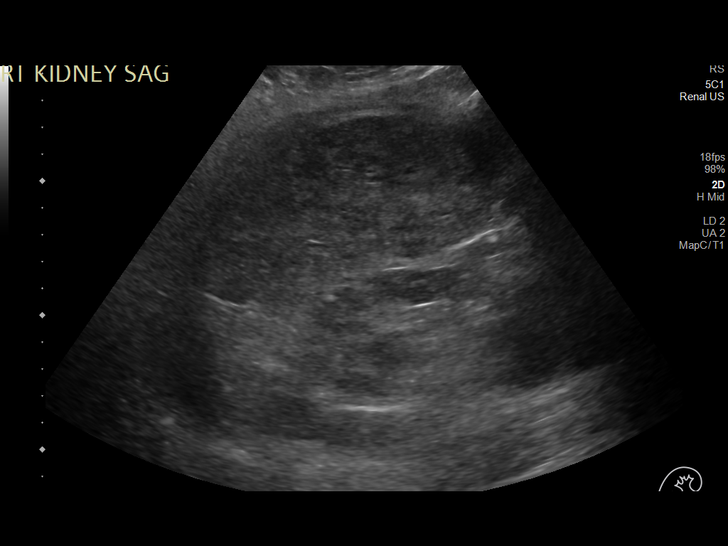
[im 5/53]
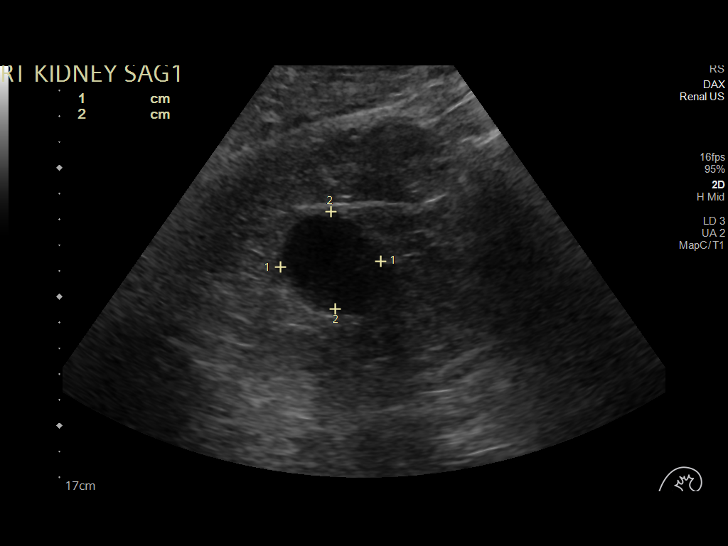
[im 9/53]
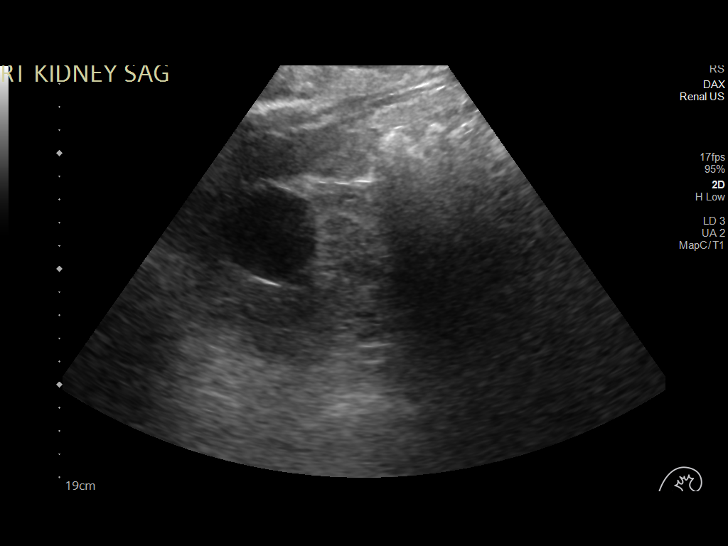
[im 14/53]
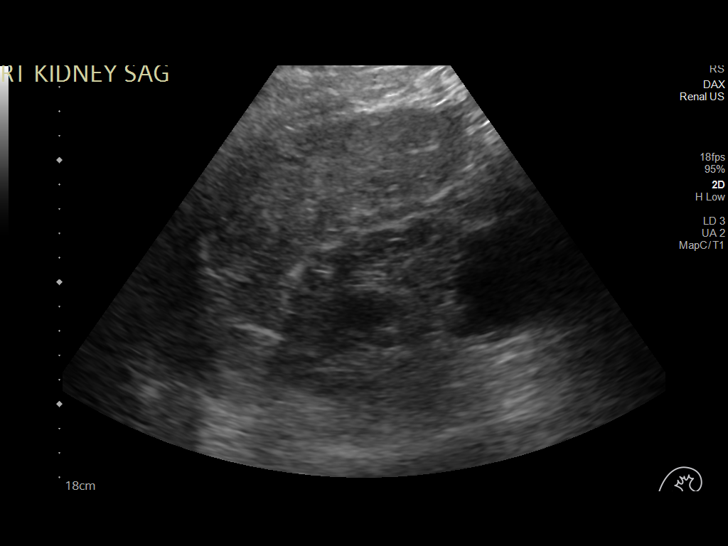
[im 18/53]
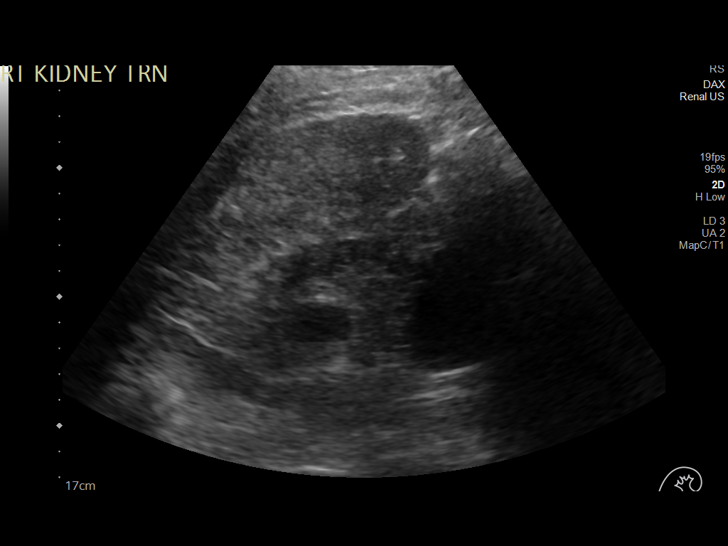
[im 20/53]
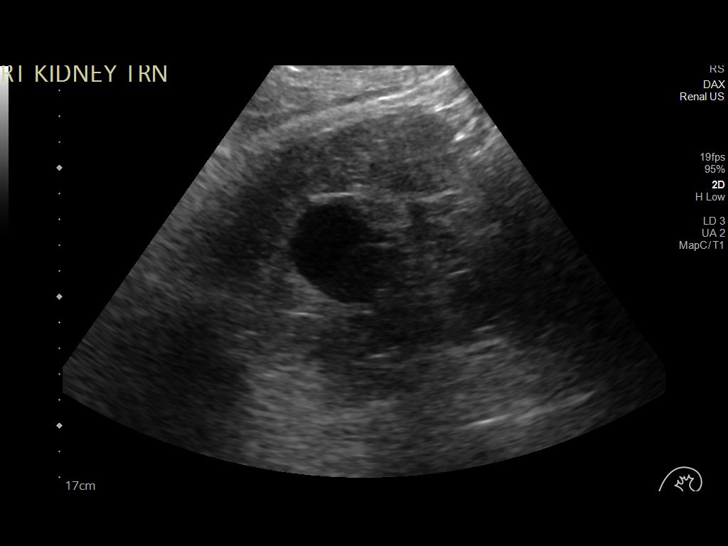
[im 24/53]
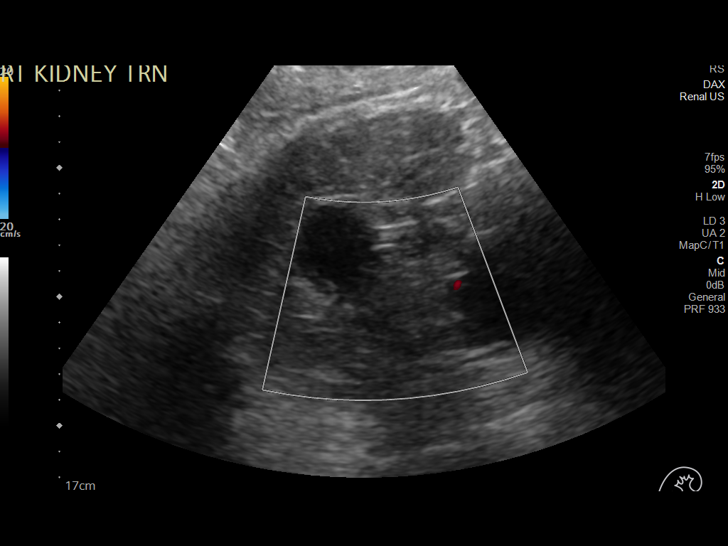
[im 29/53]
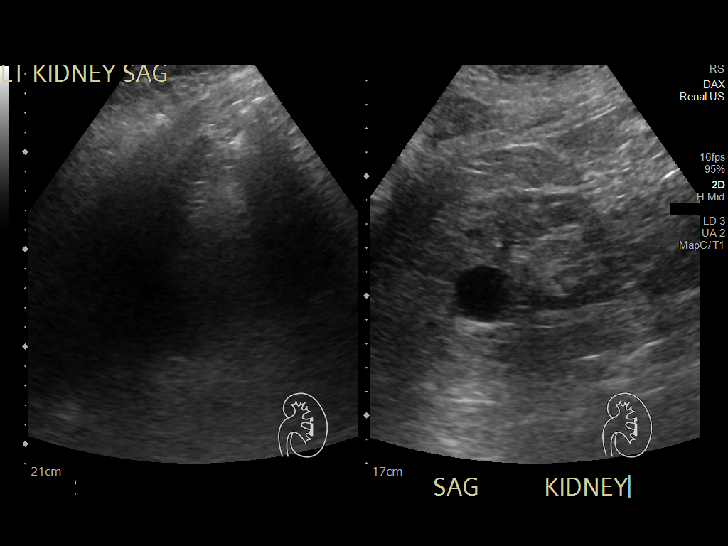
[im 33/53]
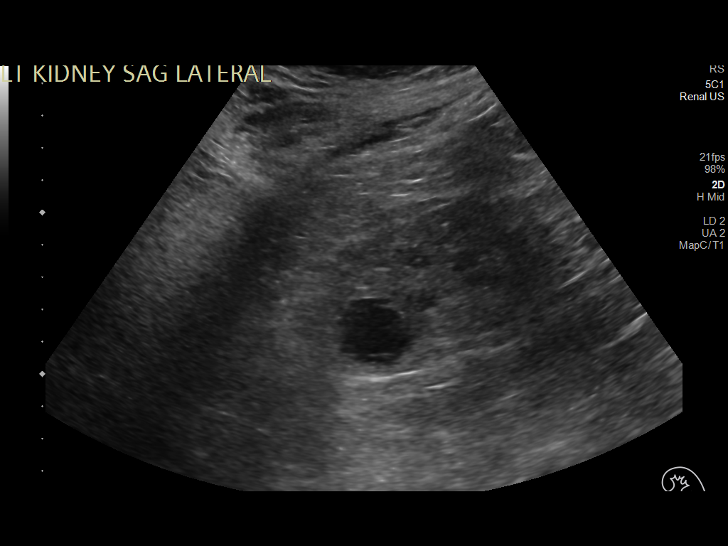
[im 35/53]
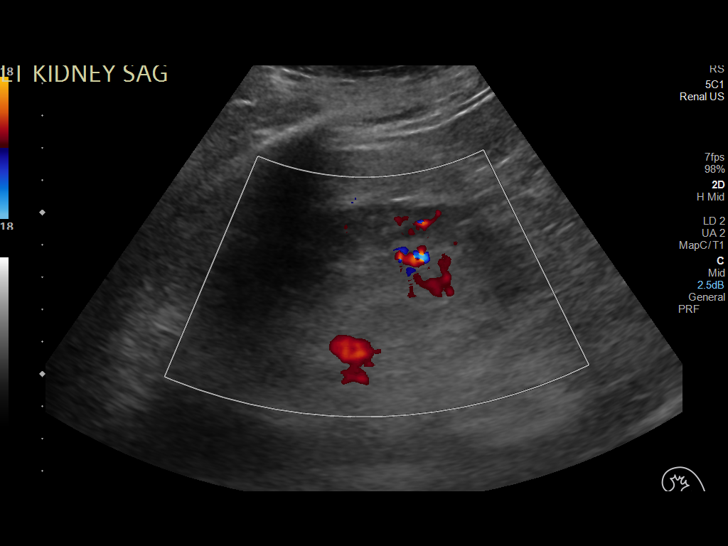
[im 40/53]
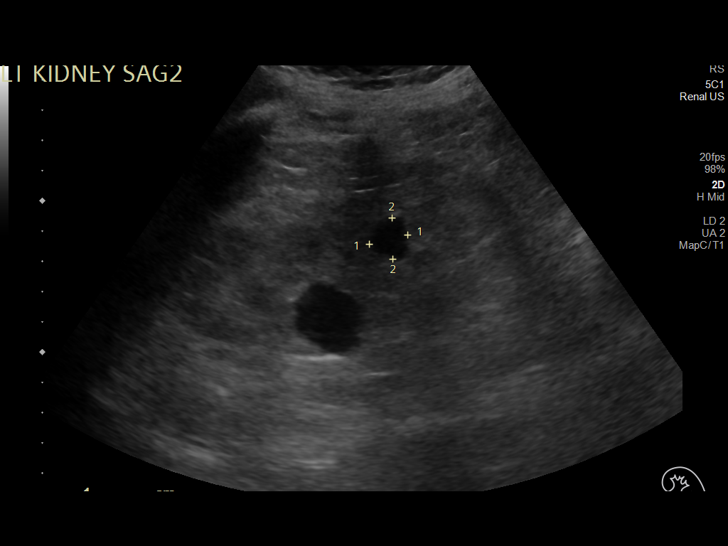
[im 44/53]
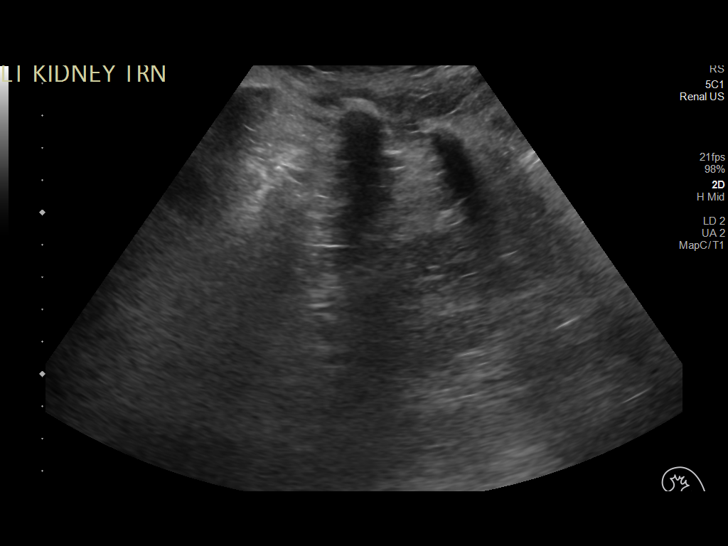
[im 48/53]
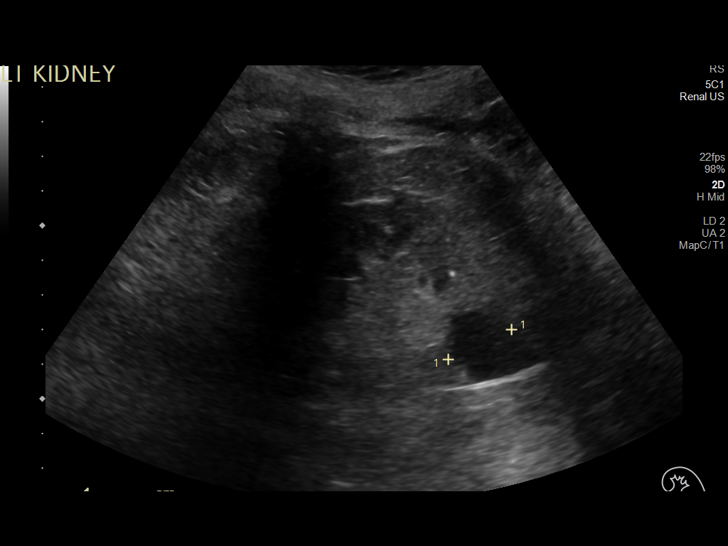
[im 53/53]
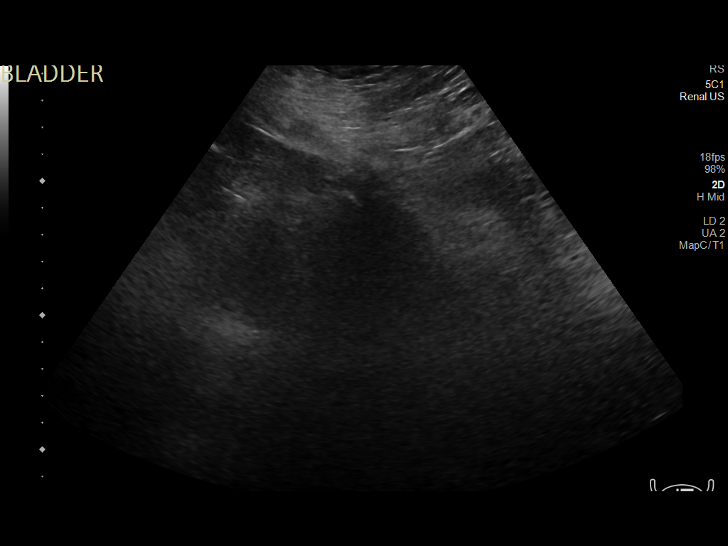

[14 of 25 positions shown; findings below may reference images not displayed]

FINDINGS: Right Kidney:

Renal measurements: 11.9 x 5.6 x 6.6 cm. = volume: 230 mL. Increased
echogenicity is noted with thinning of the cortex. Cysts are seen
within the kidney the largest of which measures 4.2 cm in the lower
pole of the right kidney. No hydronephrosis is noted.

Left Kidney:

Renal measurements: 10.7 x 5.2 x 5.6 cm. = volume: 161 mL. Cysts are
noted within the left kidney is well the largest of which lies in
the midportion measuring 2.3 cm. Increased echogenicity and cortical
thinning is noted similar to that seen on the right.

Bladder:

Decompressed

Other:

Small right pleural effusion.
IMPRESSION: Bilateral renal cysts and changes consistent with medical renal
disease.

Small right pleural effusion.

## 2022-08-16 IMAGING — DX DG CHEST 1V
1 series · 1 of 1 positions shown · non-contrast
Comparison: 04/07/2020; 04/06/2020; 04/05/2020

CLINICAL DATA: Post right-sided thoracentesis

EXAM:
CHEST  1 VIEW

[chest pa]
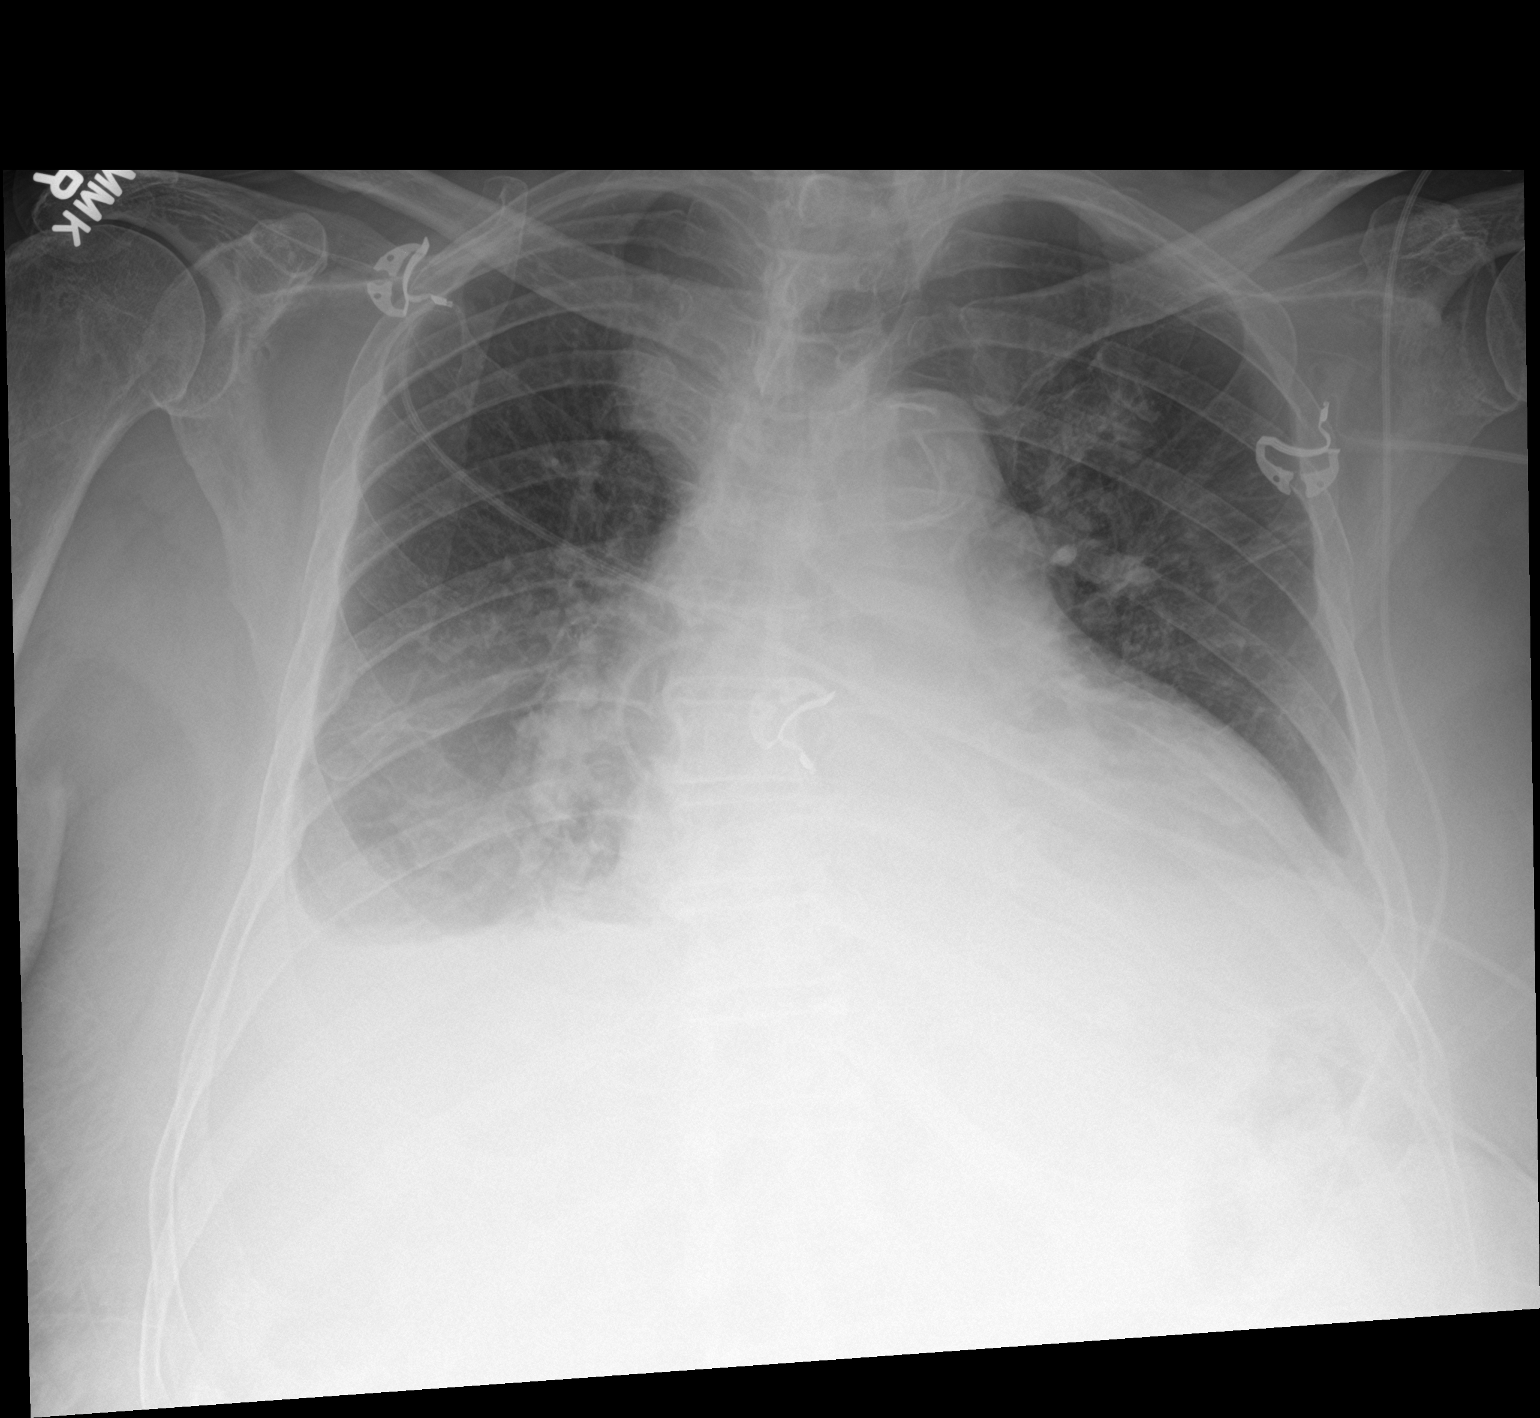

[1 of 1 positions shown; findings below may reference images not displayed]

FINDINGS: Grossly unchanged enlarged cardiac silhouette and mediastinal
contours with atherosclerotic plaque thoracic aorta. There is
persistent thickening the right paratracheal stripe presumably
secondary prominent vasculature. Interval reduction in persistent
small right-sided effusion post thoracentesis. No pneumothorax.
Improved aeration the right lung base with persistent right basilar
heterogeneous/consolidative opacities. Unchanged trace left-sided
effusion associated left basilar heterogeneous opacities. Pulmonary
vasculature remains indistinct with cephalization of flow. No acute
osseous abnormalities.
IMPRESSION: 1. Interval reduction in persistent small right-sided effusion post
thoracentesis. No pneumothorax.
2. Improved aeration the right lung base with persistent bibasilar
opacities, atelectasis versus infiltrate.
3. Otherwise, similar findings of pulmonary edema and trace
left-sided pleural effusion.
